# Patient Record
Sex: Female | Born: 1955 | Race: White | Hispanic: No | Marital: Married | Smoking: Former smoker
Health system: Southern US, Community
[De-identification: ages and names within clinical notes are randomized; demographics above are authoritative.]

## PROBLEM LIST (undated history)

## (undated) ENCOUNTER — Ambulatory Visit: Admission: EM | Discharge status: Against Medical Advice | Payer: Medicaid Other

## (undated) DIAGNOSIS — B1920 Unspecified viral hepatitis C without hepatic coma: Secondary | ICD-10-CM

## (undated) DIAGNOSIS — F329 Major depressive disorder, single episode, unspecified: Secondary | ICD-10-CM

## (undated) DIAGNOSIS — F32A Depression, unspecified: Secondary | ICD-10-CM

## (undated) DIAGNOSIS — F1920 Other psychoactive substance dependence, uncomplicated: Secondary | ICD-10-CM

## (undated) DIAGNOSIS — R569 Unspecified convulsions: Secondary | ICD-10-CM

## (undated) DIAGNOSIS — H269 Unspecified cataract: Secondary | ICD-10-CM

## (undated) DIAGNOSIS — R519 Headache, unspecified: Secondary | ICD-10-CM

## (undated) DIAGNOSIS — Z860100 Personal history of colon polyps, unspecified: Secondary | ICD-10-CM

## (undated) DIAGNOSIS — J449 Chronic obstructive pulmonary disease, unspecified: Secondary | ICD-10-CM

## (undated) DIAGNOSIS — B019 Varicella without complication: Secondary | ICD-10-CM

## (undated) DIAGNOSIS — R51 Headache: Secondary | ICD-10-CM

## (undated) DIAGNOSIS — Z8619 Personal history of other infectious and parasitic diseases: Secondary | ICD-10-CM

## (undated) DIAGNOSIS — I639 Cerebral infarction, unspecified: Secondary | ICD-10-CM

## (undated) HISTORY — PX: EYE SURGERY: SHX253

## (undated) HISTORY — PX: CHOLECYSTECTOMY: SHX55

## (undated) HISTORY — DX: Cerebral infarction, unspecified: I63.9

## (undated) HISTORY — PX: APPENDECTOMY: SHX54

---

## 2004-04-12 ENCOUNTER — Inpatient Hospital Stay: Payer: Self-pay | Admitting: Internal Medicine

## 2005-07-27 ENCOUNTER — Emergency Department: Payer: Self-pay | Admitting: Emergency Medicine

## 2006-10-16 ENCOUNTER — Emergency Department: Payer: Self-pay | Admitting: Emergency Medicine

## 2008-11-02 ENCOUNTER — Inpatient Hospital Stay: Payer: Self-pay | Admitting: *Deleted

## 2008-11-04 ENCOUNTER — Ambulatory Visit: Payer: Self-pay | Admitting: Oncology

## 2008-12-05 ENCOUNTER — Ambulatory Visit: Payer: Self-pay | Admitting: Oncology

## 2008-12-13 ENCOUNTER — Emergency Department: Payer: Self-pay | Admitting: Emergency Medicine

## 2009-07-17 ENCOUNTER — Emergency Department: Payer: Self-pay | Admitting: Internal Medicine

## 2010-03-01 ENCOUNTER — Ambulatory Visit: Payer: Self-pay | Admitting: Internal Medicine

## 2010-08-30 ENCOUNTER — Emergency Department: Payer: Self-pay | Admitting: Internal Medicine

## 2010-12-18 ENCOUNTER — Ambulatory Visit: Payer: Self-pay | Admitting: Family Medicine

## 2011-01-10 DIAGNOSIS — G43909 Migraine, unspecified, not intractable, without status migrainosus: Secondary | ICD-10-CM | POA: Insufficient documentation

## 2011-01-10 DIAGNOSIS — G63 Polyneuropathy in diseases classified elsewhere: Secondary | ICD-10-CM | POA: Insufficient documentation

## 2011-03-24 DIAGNOSIS — R609 Edema, unspecified: Secondary | ICD-10-CM | POA: Insufficient documentation

## 2011-03-24 DIAGNOSIS — F17200 Nicotine dependence, unspecified, uncomplicated: Secondary | ICD-10-CM | POA: Insufficient documentation

## 2012-03-10 ENCOUNTER — Ambulatory Visit: Payer: Self-pay | Admitting: Ophthalmology

## 2012-03-28 ENCOUNTER — Ambulatory Visit: Payer: Self-pay | Admitting: Physician Assistant

## 2012-04-14 ENCOUNTER — Ambulatory Visit: Payer: Self-pay | Admitting: Ophthalmology

## 2012-05-01 ENCOUNTER — Inpatient Hospital Stay: Payer: Self-pay | Admitting: Internal Medicine

## 2012-05-01 LAB — COMPREHENSIVE METABOLIC PANEL
Albumin: 3.9 g/dL (ref 3.4–5.0)
Alkaline Phosphatase: 173 U/L — ABNORMAL HIGH (ref 50–136)
Anion Gap: 10 (ref 7–16)
BUN: 12 mg/dL (ref 7–18)
Calcium, Total: 9.1 mg/dL (ref 8.5–10.1)
Chloride: 100 mmol/L (ref 98–107)
EGFR (African American): >60
Glucose: 135 mg/dL — ABNORMAL HIGH (ref 65–99)
Potassium: 3.2 mmol/L — ABNORMAL LOW (ref 3.5–5.1)
SGOT(AST): 13 U/L — ABNORMAL LOW (ref 15–37)
SGPT (ALT): 21 U/L (ref 12–78)
Total Protein: 7.1 g/dL (ref 6.4–8.2)

## 2012-05-01 LAB — CBC
HCT: 42.7 % (ref 35.0–47.0)
HGB: 14.1 g/dL (ref 12.0–16.0)
MCH: 32.3 pg (ref 26.0–34.0)
MCHC: 32.9 g/dL (ref 32.0–36.0)
MCV: 98 fL (ref 80–100)
RBC: 4.35 10*6/uL (ref 3.80–5.20)
RDW: 14.6 % — ABNORMAL HIGH (ref 11.5–14.5)

## 2012-05-01 LAB — TROPONIN I: Troponin-I: <0.02 ng/mL

## 2012-05-01 LAB — URINALYSIS, COMPLETE
Bilirubin,UR: NEGATIVE
Ph: 6 (ref 4.5–8.0)
Protein: 30
RBC,UR: 5 /HPF (ref 0–5)
Squamous Epithelial: <1

## 2012-05-01 LAB — CSF CELL CT + PROT + GLU PANEL
CSF Tube #: 1
Eosinophil: 0 %
Glucose, CSF: 97 mg/dL — ABNORMAL HIGH (ref 40–75)
Protein, CSF: 171 mg/dL — ABNORMAL HIGH (ref 15–45)
RBC (CSF): 12982 /mm3
WBC (CSF): 73 /mm3

## 2012-05-01 LAB — DRUG SCREEN, URINE
Amphetamines, Ur Screen: NEGATIVE (ref ?–1000)
Benzodiazepine, Ur Scrn: NEGATIVE (ref ?–200)
MDMA (Ecstasy)Ur Screen: NEGATIVE (ref ?–500)
Methadone, Ur Screen: POSITIVE (ref ?–300)
Opiate, Ur Screen: NEGATIVE (ref ?–300)
Phencyclidine (PCP) Ur S: NEGATIVE (ref ?–25)
Tricyclic, Ur Screen: NEGATIVE (ref ?–1000)

## 2012-05-01 LAB — ETHANOL
Ethanol %: <0.003 % (ref 0.000–0.080)
Ethanol: <3 mg/dL

## 2012-05-01 LAB — ACETAMINOPHEN LEVEL: Acetaminophen: <2 ug/mL

## 2012-05-02 LAB — CBC WITH DIFFERENTIAL/PLATELET
Bands: 3 %
HGB: 16.5 g/dL — ABNORMAL HIGH (ref 12.0–16.0)
MCV: 97 fL (ref 80–100)
Monocytes: 7 %
RBC: 5.16 10*6/uL (ref 3.80–5.20)
Segmented Neutrophils: 78 %
Variant Lymphocyte - H1-Rlymph: 2 %
WBC: 27.2 10*3/uL — ABNORMAL HIGH (ref 3.6–11.0)

## 2012-05-02 LAB — BASIC METABOLIC PANEL
Anion Gap: 12 (ref 7–16)
BUN: 11 mg/dL (ref 7–18)
Chloride: 93 mmol/L — ABNORMAL LOW (ref 98–107)
Creatinine: 0.78 mg/dL (ref 0.60–1.30)
EGFR (African American): >60
EGFR (Non-African Amer.): >60
Glucose: 133 mg/dL — ABNORMAL HIGH (ref 65–99)
Osmolality: 264 (ref 275–301)

## 2012-05-02 LAB — POTASSIUM: Potassium: 3.2 mmol/L — ABNORMAL LOW (ref 3.5–5.1)

## 2012-05-03 LAB — CBC WITH DIFFERENTIAL/PLATELET
Basophil %: 0.2 %
HCT: 41 % (ref 35.0–47.0)
HGB: 14.2 g/dL (ref 12.0–16.0)
Lymphocyte #: 2.8 10*3/uL (ref 1.0–3.6)
Lymphocyte %: 13.1 %
MCH: 34 pg (ref 26.0–34.0)
MCV: 98 fL (ref 80–100)
Monocyte %: 7.3 %
Neutrophil #: 16.7 10*3/uL — ABNORMAL HIGH (ref 1.4–6.5)
Platelet: 121 10*3/uL — ABNORMAL LOW (ref 150–440)
RDW: 14.6 % — ABNORMAL HIGH (ref 11.5–14.5)

## 2012-05-03 LAB — BASIC METABOLIC PANEL
Calcium, Total: 8 mg/dL — ABNORMAL LOW (ref 8.5–10.1)
Chloride: 97 mmol/L — ABNORMAL LOW (ref 98–107)
Creatinine: 1.1 mg/dL (ref 0.60–1.30)
EGFR (African American): >60
EGFR (Non-African Amer.): 57 — ABNORMAL LOW
Glucose: 164 mg/dL — ABNORMAL HIGH (ref 65–99)
Potassium: 3.9 mmol/L (ref 3.5–5.1)
Sodium: 129 mmol/L — ABNORMAL LOW (ref 136–145)

## 2012-05-04 LAB — BASIC METABOLIC PANEL
BUN: 12 mg/dL (ref 7–18)
Calcium, Total: 8.3 mg/dL — ABNORMAL LOW (ref 8.5–10.1)
Chloride: 111 mmol/L — ABNORMAL HIGH (ref 98–107)
Co2: 23 mmol/L (ref 21–32)
Creatinine: 0.88 mg/dL (ref 0.60–1.30)
EGFR (African American): >60
Glucose: 157 mg/dL — ABNORMAL HIGH (ref 65–99)

## 2012-05-04 LAB — CBC WITH DIFFERENTIAL/PLATELET
Basophil #: 0 10*3/uL (ref 0.0–0.1)
Basophil %: 0.2 %
Eosinophil #: 0 10*3/uL (ref 0.0–0.7)
Lymphocyte %: 3.8 %
MCV: 99 fL (ref 80–100)
Monocyte #: 0.7 x10 3/mm (ref 0.2–0.9)
Neutrophil #: 12.5 10*3/uL — ABNORMAL HIGH (ref 1.4–6.5)
Neutrophil %: 91.2 %
RBC: 3.71 10*6/uL — ABNORMAL LOW (ref 3.80–5.20)
RDW: 14.6 % — ABNORMAL HIGH (ref 11.5–14.5)
WBC: 13.8 10*3/uL — ABNORMAL HIGH (ref 3.6–11.0)

## 2012-05-04 LAB — CSF CULTURE

## 2012-05-04 LAB — AMMONIA: Ammonia, Plasma: <25 mcmol/L (ref 11–32)

## 2012-05-05 DIAGNOSIS — J984 Other disorders of lung: Secondary | ICD-10-CM

## 2012-05-05 LAB — CBC WITH DIFFERENTIAL/PLATELET
Basophil %: 0.4 %
Eosinophil #: 0 10*3/uL (ref 0.0–0.7)
Eosinophil %: 0 %
Lymphocyte #: 1.1 10*3/uL (ref 1.0–3.6)
Lymphocyte %: 10.4 %
MCH: 33.8 pg (ref 26.0–34.0)
MCHC: 34.5 g/dL (ref 32.0–36.0)
MCV: 98 fL (ref 80–100)
Monocyte #: 0.4 x10 3/mm (ref 0.2–0.9)
Monocyte %: 3.5 %
Neutrophil #: 9.4 10*3/uL — ABNORMAL HIGH (ref 1.4–6.5)
Platelet: 144 10*3/uL — ABNORMAL LOW (ref 150–440)
RBC: 3.64 10*6/uL — ABNORMAL LOW (ref 3.80–5.20)

## 2012-05-05 LAB — BASIC METABOLIC PANEL
Anion Gap: 9 (ref 7–16)
Calcium, Total: 8.3 mg/dL — ABNORMAL LOW (ref 8.5–10.1)
Co2: 22 mmol/L (ref 21–32)
EGFR (African American): >60
EGFR (Non-African Amer.): >60
Glucose: 158 mg/dL — ABNORMAL HIGH (ref 65–99)
Osmolality: 292 (ref 275–301)
Potassium: 3.3 mmol/L — ABNORMAL LOW (ref 3.5–5.1)
Sodium: 145 mmol/L (ref 136–145)

## 2012-05-05 LAB — POTASSIUM: Potassium: 4.5 mmol/L (ref 3.5–5.1)

## 2012-05-05 LAB — PHOSPHORUS: Phosphorus: 3 mg/dL (ref 2.5–4.9)

## 2012-05-06 LAB — CBC WITH DIFFERENTIAL/PLATELET
Basophil #: 0 10*3/uL (ref 0.0–0.1)
Eosinophil #: 0 10*3/uL (ref 0.0–0.7)
Eosinophil %: 0 %
HGB: 11.8 g/dL — ABNORMAL LOW (ref 12.0–16.0)
Lymphocyte #: 1.1 10*3/uL (ref 1.0–3.6)
Lymphocyte %: 10.1 %
MCH: 33.1 pg (ref 26.0–34.0)
MCHC: 33.5 g/dL (ref 32.0–36.0)
Monocyte %: 4.2 %
Neutrophil %: 85.5 %
Platelet: 160 10*3/uL (ref 150–440)
RBC: 3.55 10*6/uL — ABNORMAL LOW (ref 3.80–5.20)
RDW: 15.2 % — ABNORMAL HIGH (ref 11.5–14.5)

## 2012-05-06 LAB — BASIC METABOLIC PANEL
Anion Gap: 8 (ref 7–16)
BUN: 19 mg/dL — ABNORMAL HIGH (ref 7–18)
Calcium, Total: 8.1 mg/dL — ABNORMAL LOW (ref 8.5–10.1)
Chloride: 114 mmol/L — ABNORMAL HIGH (ref 98–107)
Co2: 22 mmol/L (ref 21–32)
Creatinine: 0.7 mg/dL (ref 0.60–1.30)
Glucose: 165 mg/dL — ABNORMAL HIGH (ref 65–99)
Osmolality: 293 (ref 275–301)
Potassium: 4.2 mmol/L (ref 3.5–5.1)
Sodium: 144 mmol/L (ref 136–145)

## 2012-05-06 LAB — CULTURE, BLOOD (SINGLE)

## 2012-05-07 LAB — EXPECTORATED SPUTUM ASSESSMENT W GRAM STAIN, RFLX TO RESP C

## 2012-05-09 LAB — CBC WITH DIFFERENTIAL/PLATELET
Basophil #: 0 10*3/uL (ref 0.0–0.1)
Eosinophil #: 0 10*3/uL (ref 0.0–0.7)
HCT: 34.3 % — ABNORMAL LOW (ref 35.0–47.0)
HGB: 11.4 g/dL — ABNORMAL LOW (ref 12.0–16.0)
Lymphocyte #: 1.5 10*3/uL (ref 1.0–3.6)
Lymphocyte %: 12.3 %
MCH: 32.8 pg (ref 26.0–34.0)
MCHC: 33.4 g/dL (ref 32.0–36.0)
Monocyte #: 0.8 x10 3/mm (ref 0.2–0.9)
Neutrophil #: 10.1 10*3/uL — ABNORMAL HIGH (ref 1.4–6.5)
RDW: 14.6 % — ABNORMAL HIGH (ref 11.5–14.5)

## 2012-05-09 LAB — BASIC METABOLIC PANEL
Anion Gap: 6 — ABNORMAL LOW (ref 7–16)
Calcium, Total: 9.1 mg/dL (ref 8.5–10.1)
Co2: 32 mmol/L (ref 21–32)
Creatinine: 0.64 mg/dL (ref 0.60–1.30)
EGFR (African American): >60
Osmolality: 274 (ref 275–301)

## 2013-08-04 DIAGNOSIS — N183 Chronic kidney disease, stage 3 unspecified: Secondary | ICD-10-CM | POA: Insufficient documentation

## 2013-10-14 DIAGNOSIS — G40309 Generalized idiopathic epilepsy and epileptic syndromes, not intractable, without status epilepticus: Secondary | ICD-10-CM | POA: Insufficient documentation

## 2013-10-26 IMAGING — CR DG CHEST 1V PORT
1 series · 2 of 2 positions shown · non-contrast
Comparison: none

REASON FOR EXAM: central line placement
COMMENTS:

[Series 1: ap · 0.17mm/px · 2 of 2 slices shown]
[im 1/2]
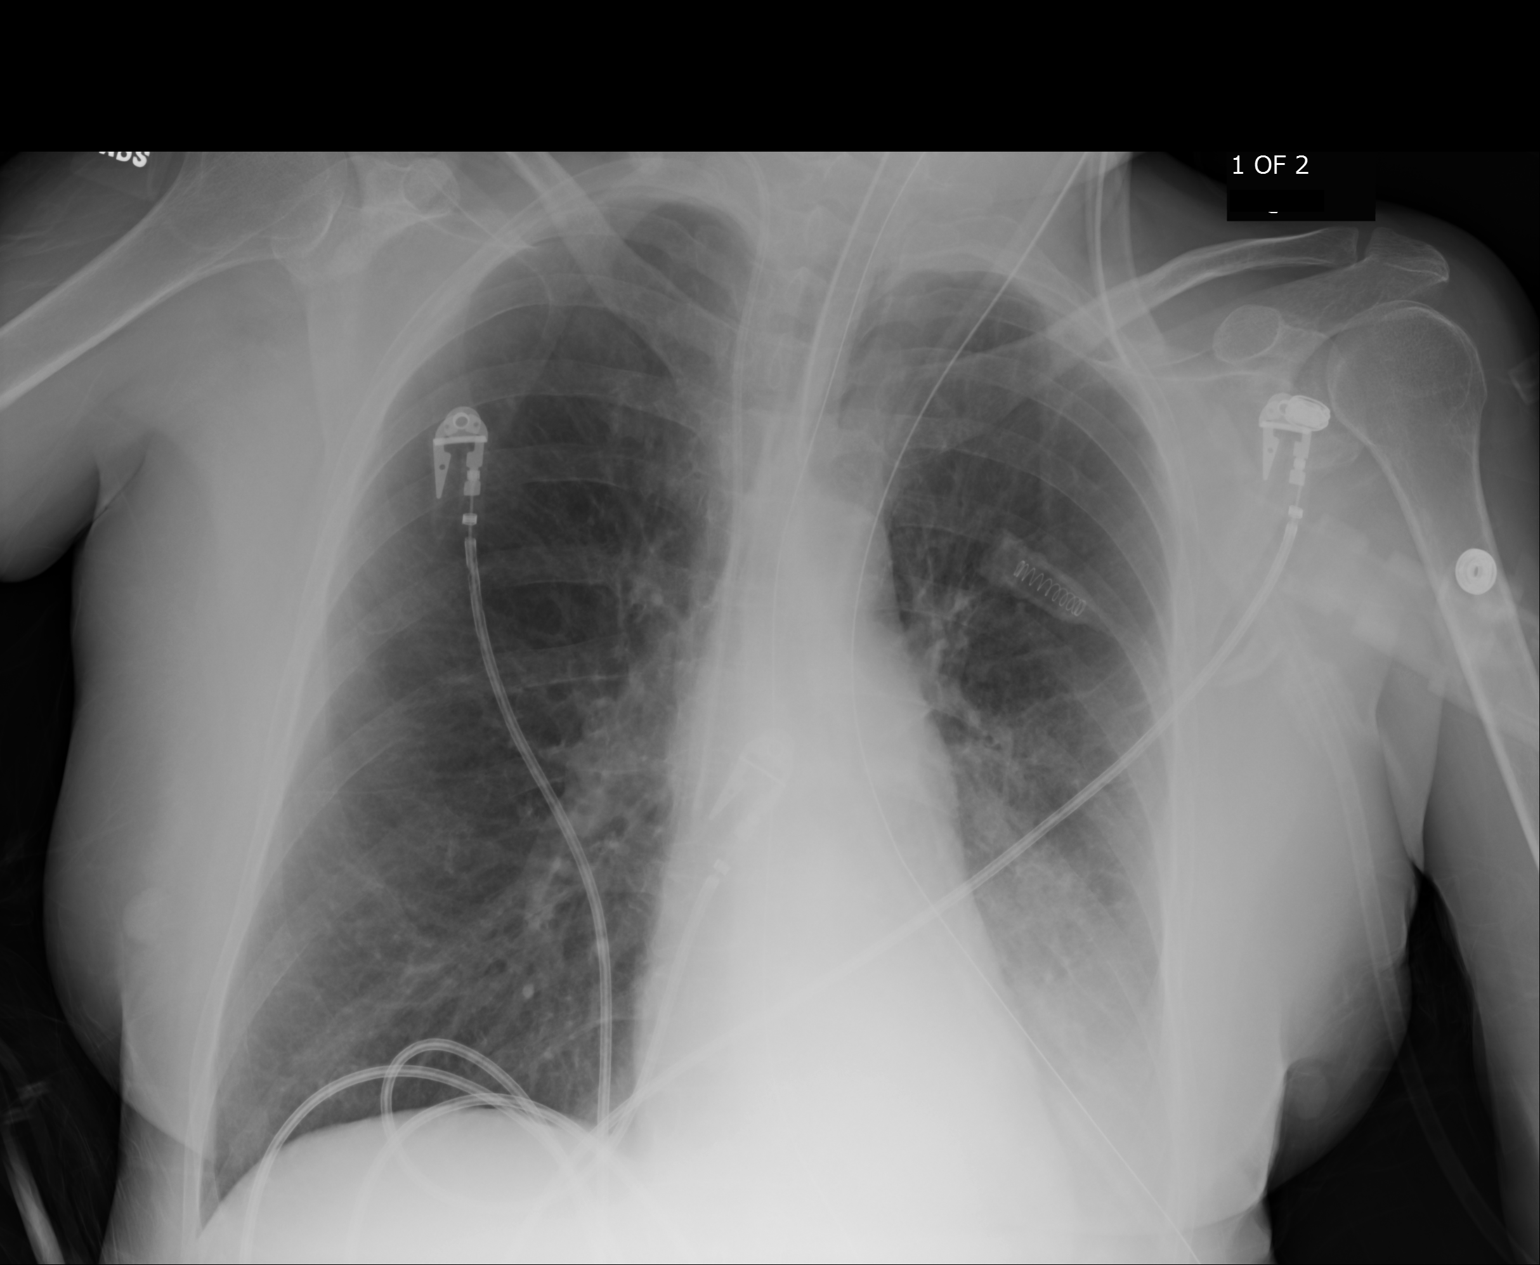
[im 2/2]
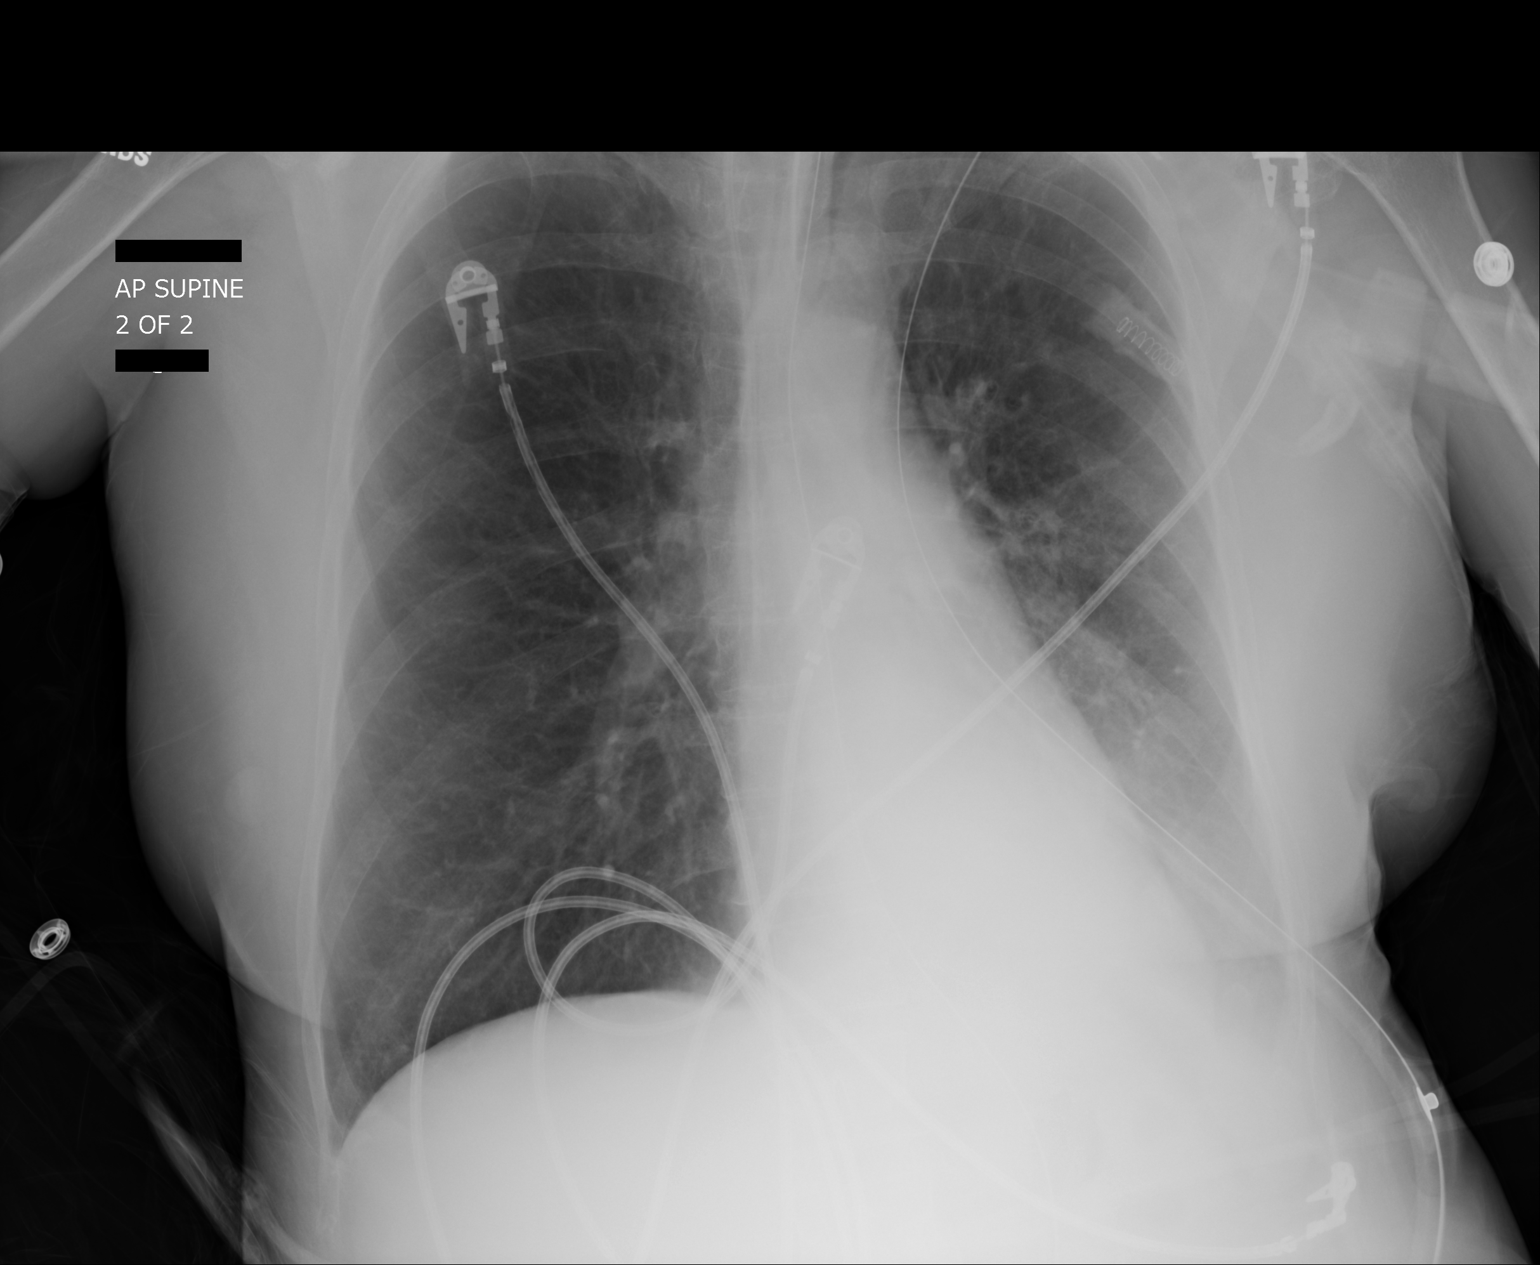

[2 of 2 positions shown; findings below may reference images not displayed]

PROCEDURE:     DXR - DXR PORTABLE CHEST SINGLE VIEW  - May 03, 2012 [DATE]

RESULT:     Comparison is made with previous study of the same date at [DATE]
a.m. The patient is rotated toward the left. A right internal central venous
catheter has been placed since the previous study. The tip appears to be in
the superior vena cava near the junction with the right atrium. Nasogastric
and endotracheal tube remain in place. There is some hazy increased density
at the left lung base.
IMPRESSION: Interval placement of a right internal jugular central
venous catheter as described. Some persistent minimal atelectasis in the
left lung base is suggested. A small left effusion is not excluded.

[REDACTED]

## 2013-10-27 IMAGING — CR DG CHEST 1V PORT
1 series · 1 of 1 positions shown · non-contrast
Comparison: none

REASON FOR EXAM: resp failure
COMMENTS:

[ap]
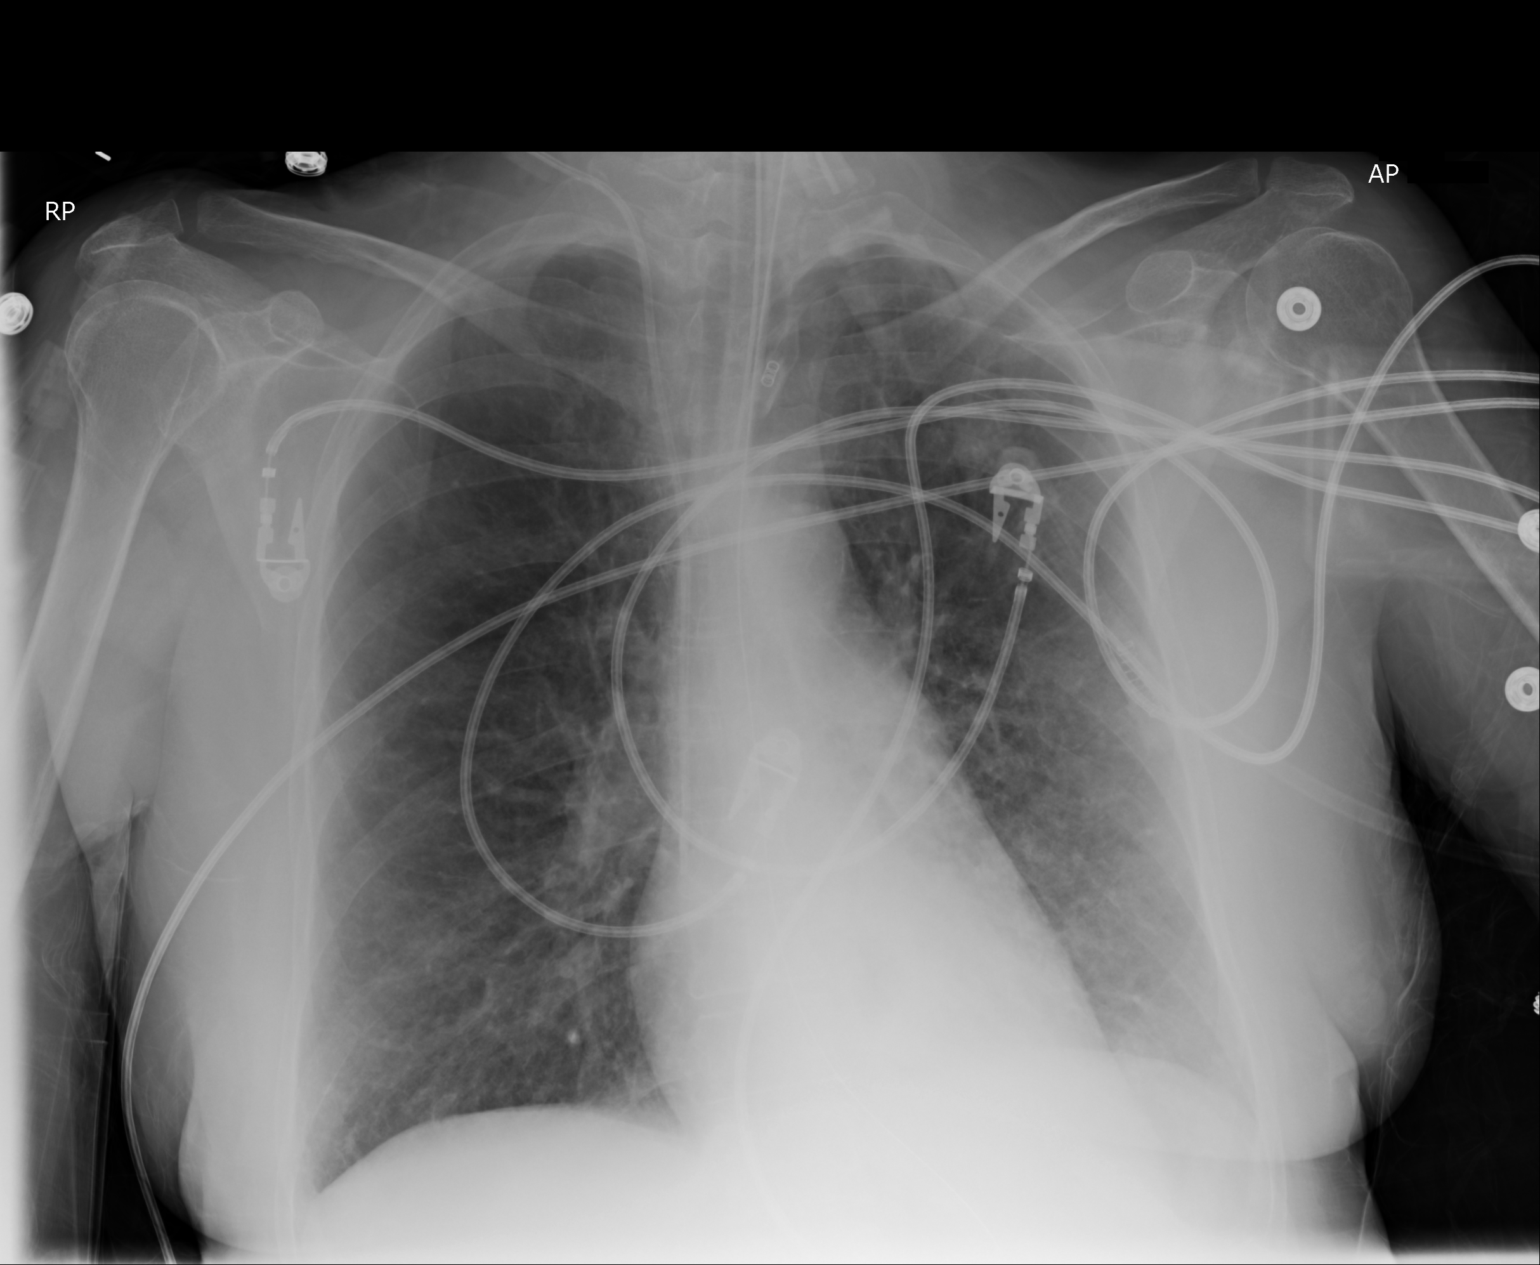

[1 of 1 positions shown; findings below may reference images not displayed]

PROCEDURE:     DXR - DXR PORTABLE CHEST SINGLE VIEW  - May 04, 2012 [DATE]

RESULT:

Comparison is made to a prior study dated 05/03/2012.

Endotracheal tube is appreciated with the tip at the level of the clavicles.
A right sided central venous catheter is appreciated with the tip at the
level of the superior vena cava, right atrial junction. NG tube is seen with
the tip not on the view of the study.

No focal regions of consolidation or focal infiltrates appreciated. There is
mild prominence of the interstitial markings. The cardiac silhouette is
within normal limits. The visualized bony skeleton is unremarkable.
IMPRESSION: 1.     Mild interstitial prominence which may represent a component of
pulmonary vascular congestion. No focal regions of consolidation or focal
infiltrates.
2.     Support lines and tubes as described above.

## 2013-10-29 IMAGING — CR DG CHEST 1V PORT
1 series · 1 of 1 positions shown · non-contrast
Comparison: none

REASON FOR EXAM: follow up pneumonia
COMMENTS:

[ap]
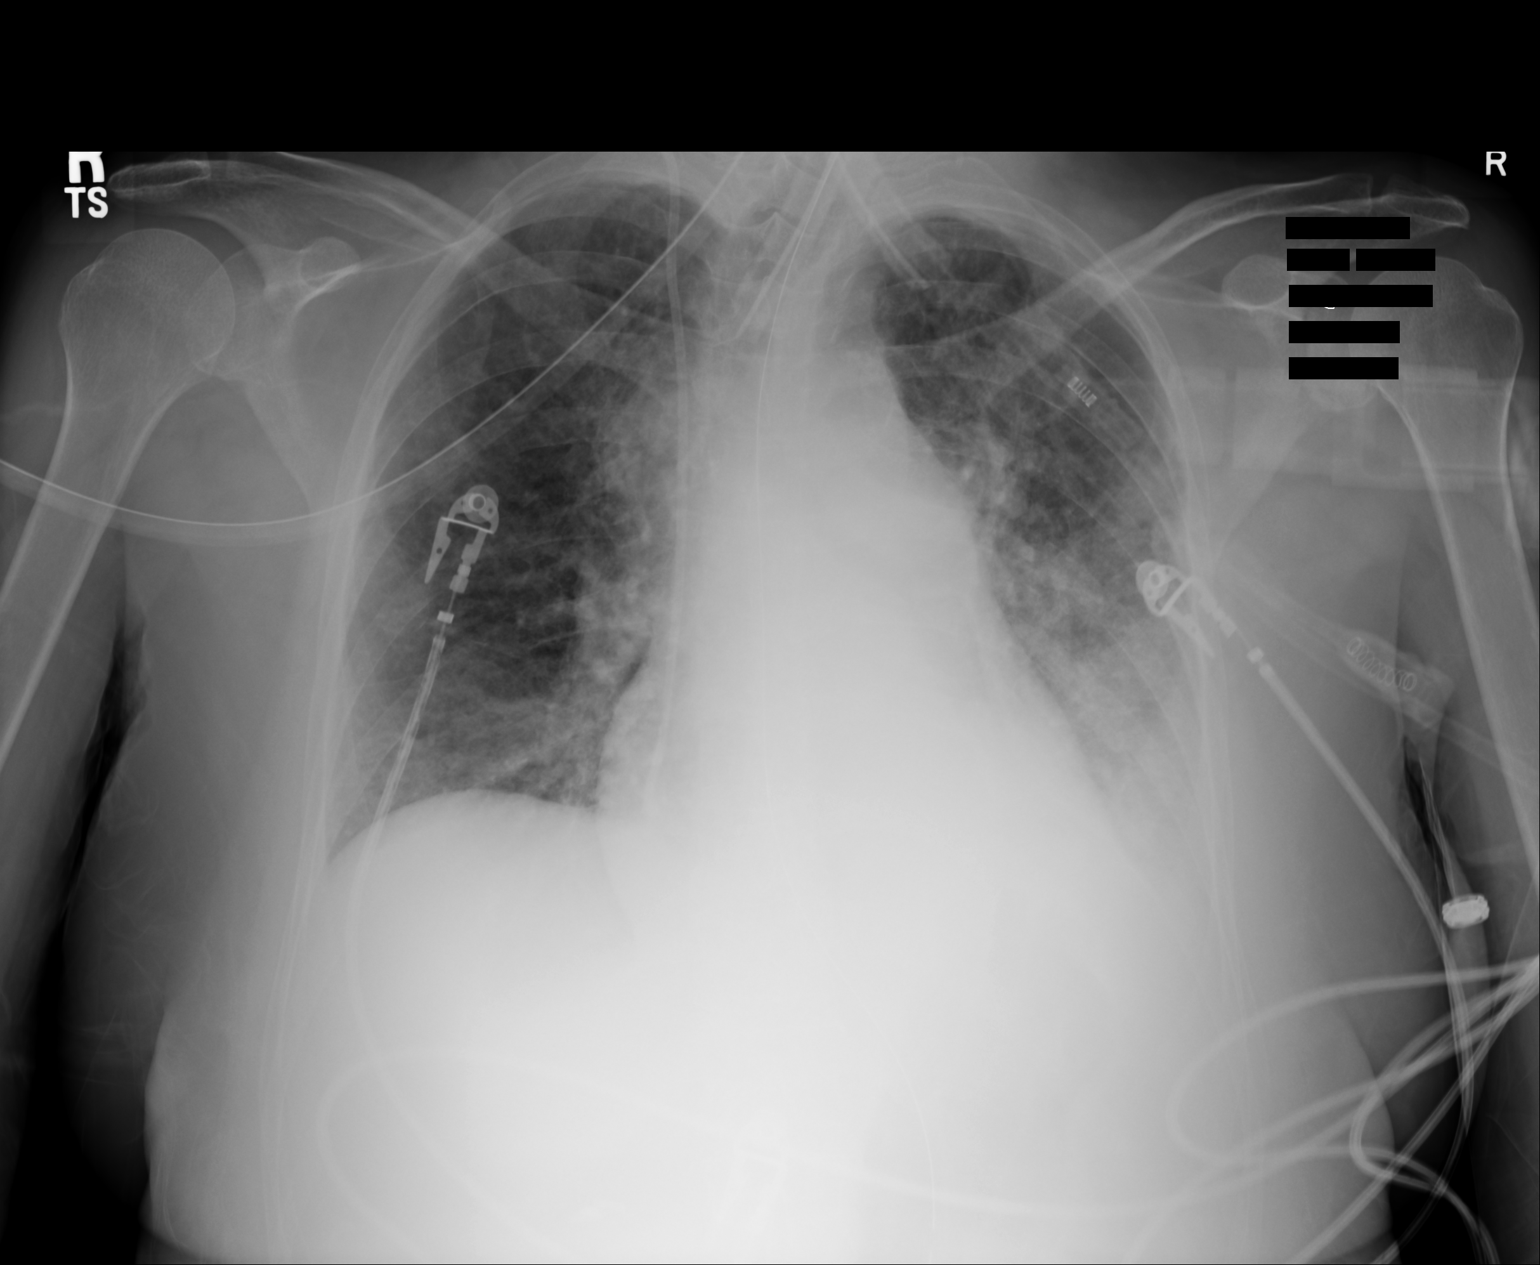

[1 of 1 positions shown; findings below may reference images not displayed]

PROCEDURE:     DXR - DXR PORTABLE CHEST SINGLE VIEW  - May 06, 2012  [DATE]

RESULT:     Comparison is made to the study of 29 up to 6937.

A right internal jugular central venous catheter projects in the inferior
right atrium near the level of the diaphragm. There is diffuse interstitial
edema. Endotracheal tube projects just above the level of the
sternoclavicular joints the patient is rotated toward the left. There is
diffuse interstitial edema. Some left lung base pneumonia or atelectasis
appears present. Small left effusion appears present.
IMPRESSION: Increasing opacification of the left lung base with
increasing interstitial edema. The cardiac silhouette is enlarged. The
findings may be affected by the lower volume of inspiration compared to the
previous study.

[REDACTED]

## 2013-10-29 IMAGING — CR DG ABDOMEN 1V
1 series · 2 of 2 positions shown · non-contrast
Comparison: none

REASON FOR EXAM: vomiting
COMMENTS:   Bedside (portable):Y

PROCEDURE:     DXR - DXR KIDNEY URETER BLADDER  - May 06, 2012  [DATE]
RESULT:     Comparison: None.

[Series 1: supine kub · 0.17mm/px · 2 of 2 slices shown]
[im 1/2]
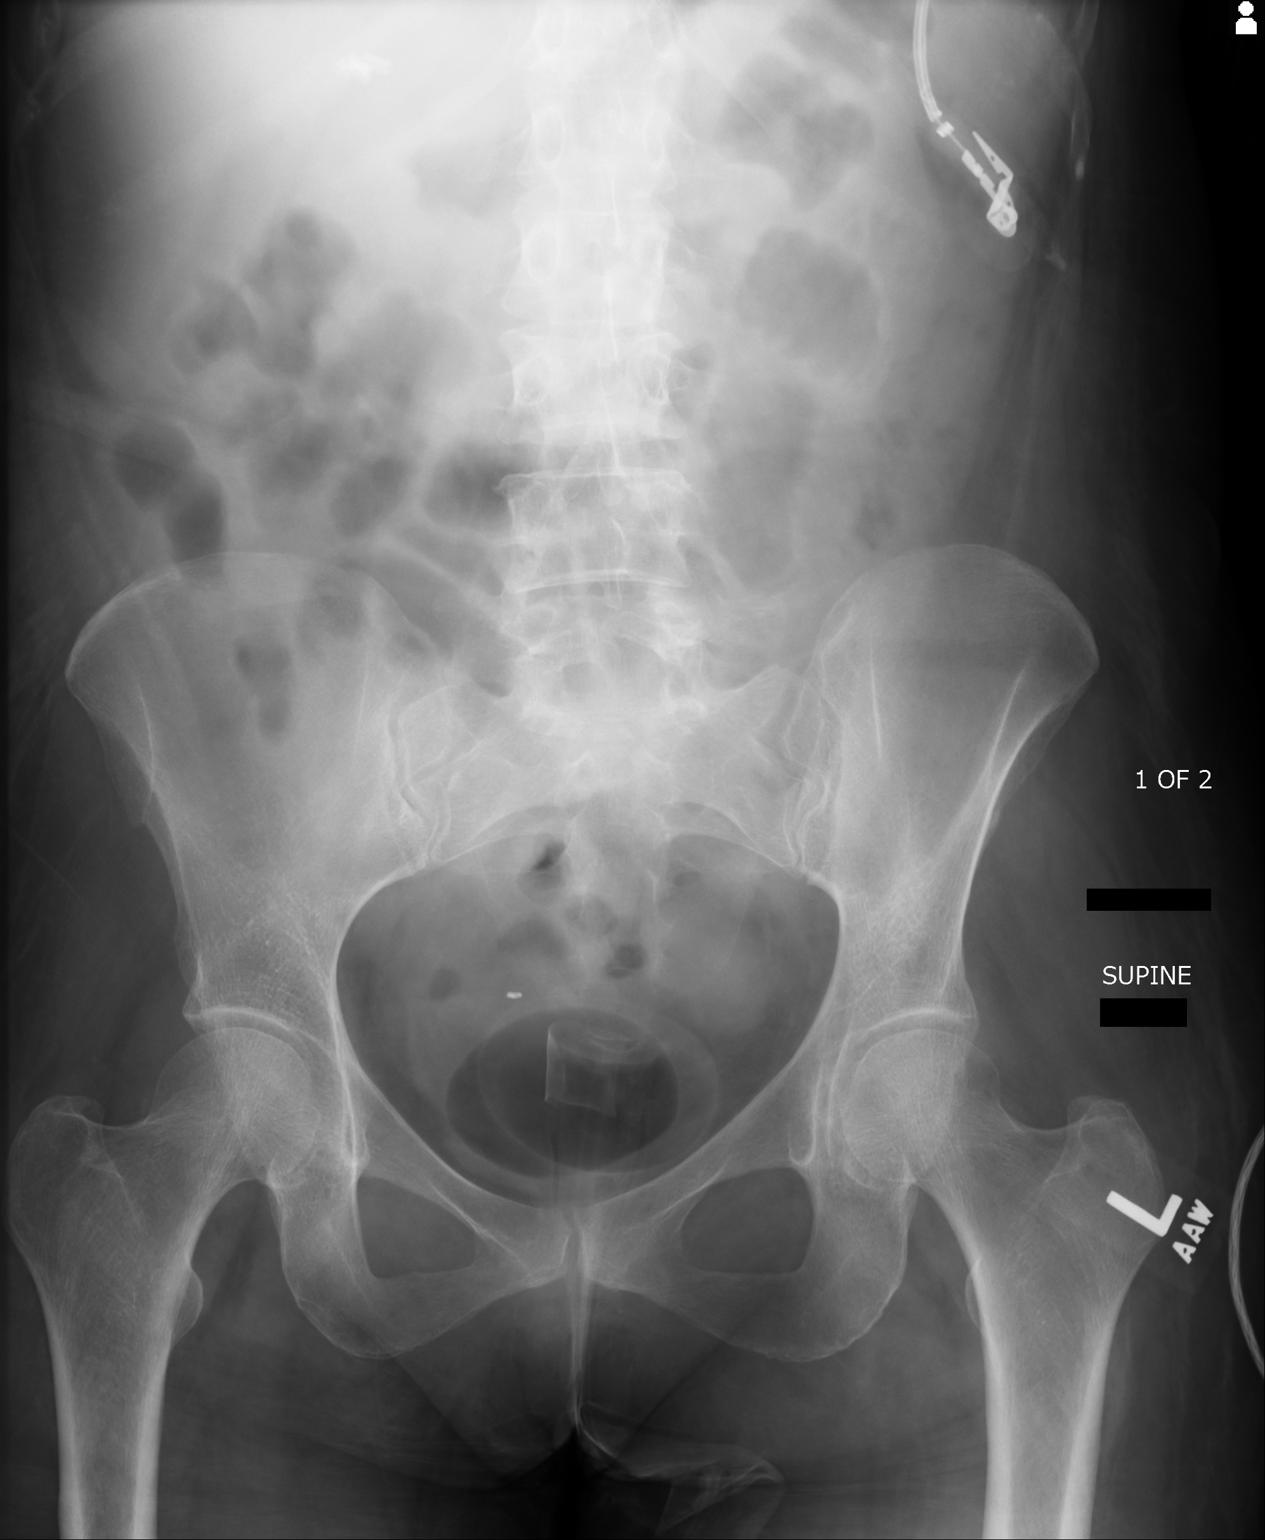
[im 2/2]
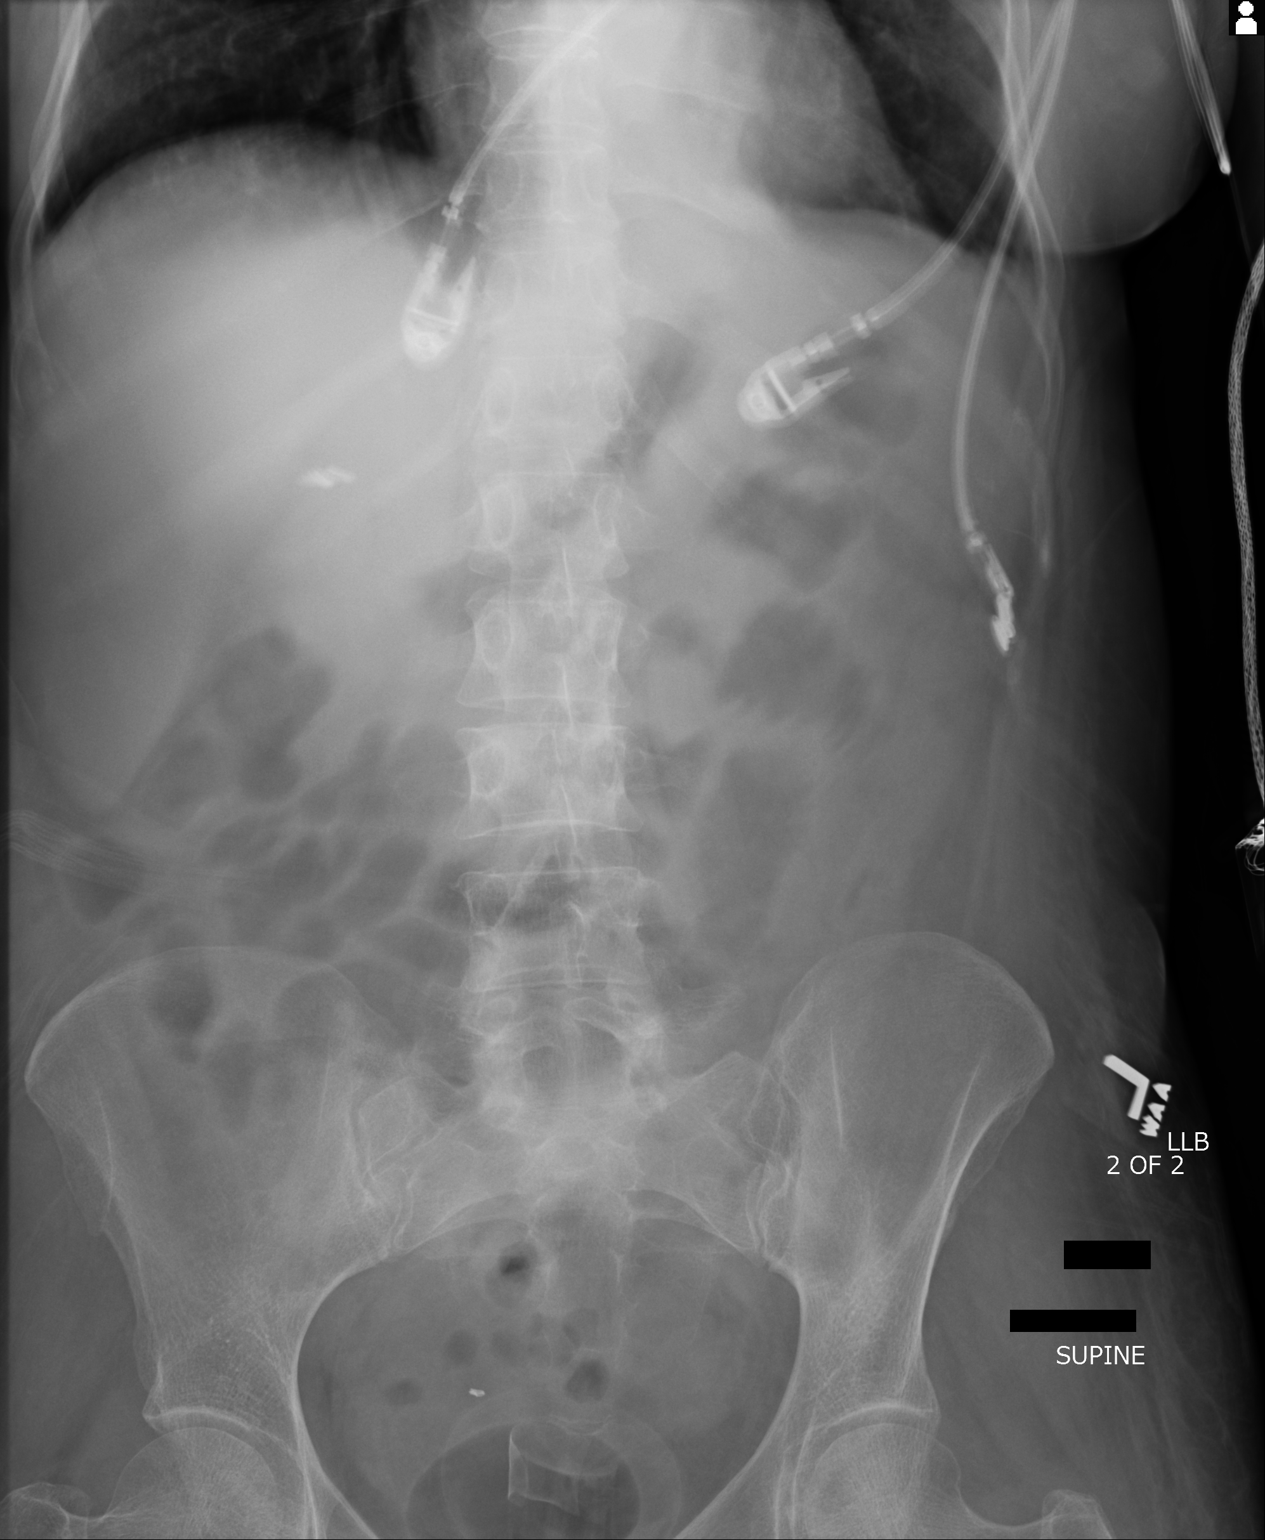

[2 of 2 positions shown; findings below may reference images not displayed]

FINDINGS: Evaluation is limited by patient motion artifact. Air is seen within
nondilated small and large bowel. Surgical clips are seen from prior
cholecystectomy. There is a round lucency with an area of rectangular
density overlying the bladder and rectum, which is of uncertain etiology.
IMPRESSION: 1. Nonobstructed bowel gas pattern.
2. There is a moderate-sized round lucency with a rectangular density
overlies the bladder and rectum. This is of uncertain etiology, possibly
related to a device within the bladder or rectum. Clinical correlation is
recommended.

[REDACTED]

## 2013-11-08 ENCOUNTER — Ambulatory Visit: Payer: Self-pay | Admitting: Gastroenterology

## 2014-07-26 DIAGNOSIS — G43709 Chronic migraine without aura, not intractable, without status migrainosus: Secondary | ICD-10-CM | POA: Insufficient documentation
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
## 2014-10-24 NOTE — Discharge Summary (Signed)
PATIENT NAME:  Natalie [name], Natalie [name] MR#:  [id number] DATE OF BIRTH:  07-04-1956  DATE OF ADMISSION:  05/01/2012 DATE OF DISCHARGE:  05/09/2012  DISCHARGE DIAGNOSES:  1. Acute encephalopathy secondary to viral encephalitis, acute on chronic respiratory failure secondary to pneumonia atelectasis and collapsed lung, status post extubation. 2. Seizure disorder. 3. Chronic pain, on methadone now. 4. Sepsis present on admission due to pneumonia and encephalitis, treated.  5. Chronic obstructive pulmonary disease. 6. Hypokalemia.  DISCHARGE MEDICATIONS:  1. Combivent aerosol 2 puffs every 4 to 6 hours. 2. Daliresp 500 mcg p.o. daily.  3. Methadone 155 mg orally once daily, says she splits the dose, says she takes 75 mg p.o. twice a day.  4. Mirtazapine 15 mg at bedtime.  5. Keppra 1 gram p.o. twice a day. The patient also had Keppra here but she says she takes 500 mg p.o. twice a day but it was increased because of possible seizure during the hospital stay and she needs to see Dr. Loletta Specter.  6. KCL 20 milliequivalents p.o. twice a day for two days only.   DISCHARGE OXYGEN: 2 liters by nasal cannula.   DIET: Regular diet.   CONSULTANTS:  [place], MD - Pulmonology. 2. Lanae Boast, MD - Infectious Disease. 3. Physical Therapy.   HOSPITAL COURSE: Acute respiratory failure. This is a 59 year old female with history of seizure disorder, hepatitic C status post treatment, history of chronic obstructive pulmonary disease on oxygen, and chronic pain; she is on methadone at this time. She came in because of altered mental status with high grade fever. She had a LP done in the Emergency Room, which was a traumatic tap. Sputum cultures have been negative. She was initially started on cefepime and vancomycin. The patient was also placed on droplet precautions. The patient' CT of the head did not show any acute changes. The patient's ABG also was within normal limits on admission. The patient was admitted.  The patient desaturated. The patient's chest x-ray at that time showed lung atelectasis, on the left side, with near opacification. The patient was immediately intubated and transferred to the Intensive Care Unit for acute respiratory failure with left lung collapse with possible mucous plug. The patient continued on full vent support with sedation. Dr. Devona Konig saw the patient. The patient was on high PEEP and actually had mucous plug resolved and the patient was finally able to come off the ventilator and was extubated on 05/06/2012. The patient was kept in the Intensive Care Unit because she was confused in between and required Geodon, but after extubation the patient remained stable. She had some nausea which resolved with Zofran. KUB was not significant, except for some constipation. The patient did receive stool softeners, rectal tube, and stools became normal and rectal tube had no output and it was out. Her  respiratory failure improved and she was on 4 liters, transferred to the floor. She is on 2 liters today. She has 2 liters of oxygen at home. She is advised to continue that. The patient did finish the cefepime for seven days and CSF cultures have been negative. She was on acyclovir initially for possible HSV encephalitis but HSV cultures were negative. We discharged her home with tapering dose of prednisone and she is advised to continue her Daliresp along with Combivent and continue oxygen and can see Dr. Devona Konig as an outpatient. She had hypokalemia, potassium is 2.9 this morning. She received potassium 40 IV and I wrote for two days of  KCL. The patient's mental status improved and she is tolerating the diet and we have added Ensure also that she can be continued on. She was seen by a physical therapist and they recommended home physical therapy. The patient does have a walker at home, according to her boyfriend. Physical therapy recommended home physical therapy. The patient did well going 20  feet with a rolling walker. They recommended home physical therapy and rehab physical therapy.  TIME SPENT ON DISCHARGE PREPARATION: More than 30 minutes.  ____________________________ Epifanio Lesches, MD sk:slb D: 05/09/2012 23:07:26 ET     T: 05/10/2012 11:19:01 ET       JOB#: [id number] cc: Allyne Gee, MD Epifanio Lesches MD ELECTRONICALLY SIGNED 05/18/2012 19:51
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
## 2014-10-24 NOTE — Consult Note (Signed)
Impression: 59yo WF w/ h/o hep C infection, complicated with hepatic encephalopathy with treatment, seizure disorder admitted with MS changes and possible meningoencephalitis.  Her lumbar puncture was a bloody tap, but I would expect that 13K RBC in the CSF would have 55 WBCs present given her peripheral WBC and RBC at the time.  As having around 6 WBC is normal, thne 61 WBC would be considered normal in her given the bloody tap.  She had 73, mostly neutrophils, which would be high.  Her cultures, however, are normal.  The description of her confusion is more consistent with encephalitis than meningitis.  Most encephalitis is viral.  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   HSV PCR is pending. Will add Arboviral and West Nile serologies, but this time of year is much less likely to have cases of these diseases as they are mosquito transmitted. Her confusion could have been related to hepatic encephalopathy or atypical seizures.  Hepatic encephalopathy has not been a problem for her except when she was on anti-hep C therapy.  She did have possible seizure activity on admission. Her fiancee states that he witnessed her aspirating while she was confused.  Her CXR and lung CT do not show infiltrate, but did show collapsed lung consistent with mucous plugging.  This has improved. Her CSF cultures are negative.  Will stop droplet isolation. Will adjust her antibiotics to cover aspiration and severe COPD exacerbation.  Will continue the cefepime.  Will send a sputum specimen for culture. Continue vent support as needed. Continue pressors as needed.    Electronic Signatures: Desirai Traxler, Heinz Knuckles (MD) (Signed on 28-Oct-13 13:45)  Authored   Last Updated: 28-Oct-13 13:46 by Mishon Blubaugh, Heinz Knuckles (MD)

## 2014-10-24 NOTE — Consult Note (Signed)
Brief Consult Note: Diagnosis: Reportedly schizophrenia.   Patient was seen by consultant.   Comments: Patient still intubated. We will attempt to interview her tomorow.  Electronic Signatures: Orson Slick (MD)  (Signed 28-Oct-13 15:28)  Authored: Brief Consult Note   Last Updated: 28-Oct-13 15:28 by Orson Slick (MD)

## 2015-04-27 ENCOUNTER — Encounter: Payer: Self-pay | Admitting: *Deleted

## 2015-04-30 ENCOUNTER — Ambulatory Visit: Payer: 59 | Admitting: Anesthesiology

## 2015-04-30 ENCOUNTER — Encounter: Payer: Self-pay | Admitting: *Deleted

## 2015-04-30 ENCOUNTER — Encounter
Admission: RE | Disposition: A | Discharge status: Home / Self Care | Payer: Self-pay | Source: Ambulatory Visit | Attending: Gastroenterology

## 2015-04-30 ENCOUNTER — Ambulatory Visit
Admission: RE | Admit: 2015-04-30 | Discharge: 2015-04-30 | Disposition: A | Discharge status: Home / Self Care | Payer: 59 | Source: Ambulatory Visit | Attending: Gastroenterology | Admitting: Gastroenterology

## 2015-04-30 DIAGNOSIS — Z91041 Radiographic dye allergy status: Secondary | ICD-10-CM | POA: Insufficient documentation

## 2015-04-30 DIAGNOSIS — Z1211 Encounter for screening for malignant neoplasm of colon: Secondary | ICD-10-CM | POA: Insufficient documentation

## 2015-04-30 DIAGNOSIS — Z8371 Family history of colonic polyps: Secondary | ICD-10-CM | POA: Diagnosis not present

## 2015-04-30 DIAGNOSIS — F17210 Nicotine dependence, cigarettes, uncomplicated: Secondary | ICD-10-CM | POA: Insufficient documentation

## 2015-04-30 DIAGNOSIS — Z880 Allergy status to penicillin: Secondary | ICD-10-CM | POA: Diagnosis not present

## 2015-04-30 DIAGNOSIS — B1920 Unspecified viral hepatitis C without hepatic coma: Secondary | ICD-10-CM | POA: Diagnosis not present

## 2015-04-30 DIAGNOSIS — Z881 Allergy status to other antibiotic agents status: Secondary | ICD-10-CM | POA: Diagnosis not present

## 2015-04-30 DIAGNOSIS — Z882 Allergy status to sulfonamides status: Secondary | ICD-10-CM | POA: Diagnosis not present

## 2015-04-30 DIAGNOSIS — Z886 Allergy status to analgesic agent status: Secondary | ICD-10-CM | POA: Insufficient documentation

## 2015-04-30 DIAGNOSIS — K621 Rectal polyp: Secondary | ICD-10-CM | POA: Insufficient documentation

## 2015-04-30 DIAGNOSIS — D123 Benign neoplasm of transverse colon: Secondary | ICD-10-CM | POA: Insufficient documentation

## 2015-04-30 DIAGNOSIS — F329 Major depressive disorder, single episode, unspecified: Secondary | ICD-10-CM | POA: Diagnosis not present

## 2015-04-30 DIAGNOSIS — J449 Chronic obstructive pulmonary disease, unspecified: Secondary | ICD-10-CM | POA: Insufficient documentation

## 2015-04-30 HISTORY — DX: Headache, unspecified: R51.9

## 2015-04-30 HISTORY — DX: Headache: R51

## 2015-04-30 HISTORY — DX: Varicella without complication: B01.9

## 2015-04-30 HISTORY — DX: Unspecified viral hepatitis C without hepatic coma: B19.20

## 2015-04-30 HISTORY — DX: Unspecified cataract: H26.9

## 2015-04-30 HISTORY — PX: COLONOSCOPY WITH PROPOFOL: SHX5780

## 2015-04-30 HISTORY — DX: Chronic obstructive pulmonary disease, unspecified: J44.9

## 2015-04-30 HISTORY — DX: Depression, unspecified: F32.A

## 2015-04-30 HISTORY — DX: Other psychoactive substance dependence, uncomplicated: F19.20

## 2015-04-30 HISTORY — DX: Unspecified convulsions: R56.9

## 2015-04-30 HISTORY — DX: Major depressive disorder, single episode, unspecified: F32.9

## 2015-04-30 SURGERY — COLONOSCOPY WITH PROPOFOL
Anesthesia: General

## 2015-04-30 MED ORDER — SODIUM CHLORIDE 0.9 % IV SOLN: INTRAVENOUS | Status: DC

## 2015-04-30 MED ORDER — PROPOFOL 10 MG/ML IV BOLUS
INTRAVENOUS | Status: DC | PRN
  Administered 2015-04-30: 40 mg via INTRAVENOUS
  Administered 2015-04-30 (×2): 30 mg via INTRAVENOUS
  Administered 2015-04-30: 60 mg via INTRAVENOUS

## 2015-04-30 MED ORDER — FENTANYL CITRATE (PF) 100 MCG/2ML IJ SOLN
INTRAMUSCULAR | Status: DC | PRN
  Administered 2015-04-30: 50 ug via INTRAVENOUS

## 2015-04-30 MED ORDER — PROPOFOL 500 MG/50ML IV EMUL
INTRAVENOUS | Status: DC | PRN
  Administered 2015-04-30: 200 ug/kg/min via INTRAVENOUS

## 2015-04-30 MED ORDER — GLYCOPYRROLATE 0.2 MG/ML IJ SOLN
INTRAMUSCULAR | Status: DC | PRN
  Administered 2015-04-30: 0.2 mg via INTRAVENOUS

## 2015-04-30 MED ORDER — LIDOCAINE HCL (CARDIAC) 20 MG/ML IV SOLN
INTRAVENOUS | Status: DC | PRN
  Administered 2015-04-30: 60 mg via INTRAVENOUS

## 2015-04-30 MED ORDER — SODIUM CHLORIDE 0.9 % IV SOLN
INTRAVENOUS | Status: DC
  Administered 2015-04-30: 09:00:00 via INTRAVENOUS

## 2015-04-30 MED ORDER — MIDAZOLAM HCL 2 MG/2ML IJ SOLN
INTRAMUSCULAR | Status: DC | PRN
  Administered 2015-04-30: 1 mg via INTRAVENOUS

## 2015-04-30 NOTE — H&P (Signed)
  Primary Care Physician:  WHITE, Orlene Och, NP  Pre-Procedure History & Physical: HPI:  Natalie [name] is a 59 y.o. female is here for an colonoscopy.   Past Medical History  Diagnosis Date  . COPD (chronic obstructive pulmonary disease) ([place])   . Depression   . Seizures ([place])   . Headache   . Hepatitis C virus infection   . Chickenpox cataracts  . Cataracts, bilateral   . Drug addiction [facility])     Past Surgical History  Procedure Laterality Date  . Appendectomy    . Eye surgery    . Cholecystectomy      Prior to Admission medications   Medication Sig Start Date End Date Taking? Authorizing Provider  acetaminophen (TYLENOL) 500 MG tablet Take 500 mg by mouth every 6 (six) hours as needed.   Yes Historical Provider, MD  albuterol (PROVENTIL HFA;VENTOLIN HFA) 108 (90 BASE) MCG/ACT inhaler Inhale 2 puffs into the lungs every 6 (six) hours as needed for wheezing or shortness of breath.   Yes Historical Provider, MD  gabapentin (NEURONTIN) 300 MG capsule Take 300 mg by mouth 3 (three) times daily.   Yes Historical Provider, MD  levETIRAcetam (KEPPRA) 1000 MG tablet Take 1,000 mg by mouth 2 (two) times daily. Take one tab in the morning and two tabs at night   Yes Historical Provider, MD  methadone (DOLOPHINE) 10 MG tablet Take 10 mg by mouth every 8 (eight) hours.   Yes Historical Provider, MD  mirtazapine (REMERON) 30 MG tablet Take 30 mg by mouth at bedtime.   Yes Historical Provider, MD  Umeclidinium-Vilanterol (ANORO ELLIPTA) 62.5-25 MCG/INH AEPB Inhale 1 mcg into the lungs daily.   Yes Historical Provider, MD    Allergies as of 04/04/2015  . (Not on File)    History reviewed. No pertinent family history.  Social History   Social History  . Marital Status: Married    Spouse Name: N/A  . Number of Children: N/A  . Years of Education: N/A   Occupational History  . Not on file.   Social History Main Topics  . Smoking status: Current Every Day Smoker -- 1.00  packs/day    Types: Cigarettes  . Smokeless tobacco: Not on file  . Alcohol Use: No  . Drug Use: No  . Sexual Activity: Not on file   Other Topics Concern  . Not on file   Social History Narrative     Physical Exam: BP 125/58 mmHg  Pulse 77  Temp(Src) 98 F (36.7 C) (Tympanic)  Resp 12  Ht 5\' 3"  (1.6 m)  Wt 56.7 kg (125 lb)  BMI 22.15 kg/m2  SpO2 100% General:   Alert,  pleasant and cooperative in NAD Head:  Normocephalic and atraumatic. Neck:  Supple; no masses or thyromegaly. Lungs:  Clear throughout to auscultation.    Heart:  Regular rate and rhythm. Abdomen:  Soft, nontender and nondistended. Normal bowel sounds, without guarding, and without rebound.   Neurologic:  Alert and  oriented x4;  grossly normal neurologically.  Impression/Plan: Natalie [name] is here for an colonoscopy to be performed for screening, +father with colon polyps  Risks, benefits, limitations, and alternatives regarding  colonoscopy have been reviewed with the patient.  Questions have been answered.  All parties agreeable.   Josefine Class, MD  04/30/2015, 9:04 AM

## 2015-04-30 NOTE — Discharge Instructions (Signed)

## 2015-04-30 NOTE — Op Note (Signed)
[facility] Patient Name: Natalie [name] Procedure Date: 04/30/2015 9:05 AM MRN: [id number] Account #: [id number] Date of Birth: March 23, 1956 Admit Type: Outpatient Age: 59 Room: [facility] ENDO ROOM 2 Gender: Female Note Status: Finalized Procedure:         Colonoscopy Indications:       Colon cancer screening in patient at increased risk:                     Family history of colon polyps, , This is the patient's                     first colonoscopy(father) Patient Profile:   This is a 59 year old female. Providers:         Gerrit Heck. Rayann Heman, MD Referring MD:      Dani Gobble. White, MD (Referring MD) Medicines:         Propofol per Anesthesia Complications:     No immediate complications. Procedure:         Pre-Anesthesia Assessment:                    - Prior to the procedure, a History and Physical was                     performed, and patient medications, allergies and                     sensitivities were reviewed. The patient's tolerance of                     previous anesthesia was reviewed.                    After obtaining informed consent, the colonoscope was                     passed under direct vision. Throughout the procedure, the                     patient's blood pressure, pulse, and oxygen saturations                     were monitored continuously. The Colonoscope was                     introduced through the anus and advanced to the the cecum,                     identified by appendiceal orifice and ileocecal valve. The                     colonoscopy was performed without difficulty. The patient                     tolerated the procedure well. The quality of the bowel                     preparation was fair at best. Lot of thick lliquid. Findings:      The perianal and digital rectal examinations were normal.      A 3 mm polyp was found in the ascending colon. The polyp was sessile.       The polyp was removed with a jumbo  cold forceps. Resection and retrieval       were complete.  Four sessile polyps were found at the hepatic flexure. The polyps were 2       to 5 mm in size. These polyps were removed with a cold snare. Resection       and retrieval were complete.      Three sessile polyps were found in the transverse colon. The polyps were       2 to 4 mm in size. These polyps were removed with a cold snare.       Resection and retrieval were complete.      A 5 mm polyp was found in the rectum. The polyp was sessile. The polyp       was removed with a cold snare. Resection and retrieval were complete.      Three sessile polyps were found in the rectum. The polyps were 2 to 3 mm       in size. These polyps were removed with a jumbo cold forceps. Resection       and retrieval were complete.      The exam was otherwise without abnormality on direct and retroflexion       views. Impression:        - One 3 mm polyp in the ascending colon. Resected and                     retrieved.                    - Four 2 to 5 mm polyps at the hepatic flexure. Resected                     and retrieved.                    - Three 2 to 4 mm polyps in the transverse colon. Resected                     and retrieved.                    - One 5 mm polyp in the rectum. Resected and retrieved.                    - Three 2 to 3 mm polyps in the rectum. Resected and                     retrieved.                    - The examination was otherwise normal on direct and                     retroflexion views. Recommendation:    - Observe patient in GI recovery unit.                    - High fiber diet.                    - Continue present medications.                    - Await pathology results.                    - Repeat colonoscopy in 1 year because the bowel  preparation was fair to poor with thick liquid. Prep was                     fair after lavage and cleaning.                    - Return to  referring physician.                    - Return to liver clinic to discuss treatment of hepatitis                     C.                    - The findings and recommendations were discussed with the                     patient.                    - The findings and recommendations were discussed with the                     patient's family. Procedure Code(s): --- Professional ---                    [phone], Colonoscopy, flexible; with removal of tumor(s),                     polyp(s), or other lesion(s) by snare technique                    87579, 1, Colonoscopy, flexible; with biopsy, single or                     multiple CPT copyright 2014 American Medical Association. All rights reserved. The codes documented in this report are preliminary and upon coder review may  be revised to meet current compliance requirements. Mellody Life, MD 04/30/2015 10:05:45 AM This report has been signed electronically. Number of Addenda: 0 Note Initiated On: 04/30/2015 9:05 AM Scope Withdrawal Time: 0 hours 27 minutes 49 seconds  Total Procedure Duration: 0 hours 43 minutes 1 second       [facility]

## 2015-04-30 NOTE — Anesthesia Preprocedure Evaluation (Signed)
Anesthesia Evaluation  Patient identified by MRN, date of birth, ID band Patient awake    Reviewed: Allergy & Precautions, H&P , NPO status , Patient's Chart, lab work & pertinent test results, reviewed documented beta blocker date and time   History of Anesthesia Complications Negative for: history of anesthetic complications  Airway Mallampati: III  TM Distance: >3 FB Neck ROM: full    Dental no notable dental hx. 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)  , Hepatitis -, C  Endo/Other  negative endocrine ROS  Renal/GU negative Renal ROS  negative genitourinary   Musculoskeletal   Abdominal   Peds  Hematology negative hematology ROS (+)   Anesthesia Other Findings Past Medical History:   COPD (chronic obstructive pulmonary disease) (*              Depression                                                   Seizures ([facility])                                               Headache                                                     Hepatitis C virus infection                                  Chickenpox                                      cataracts    Cataracts, bilateral                                         Drug addiction ([place])                                         Reproductive/Obstetrics negative OB ROS                             Anesthesia Physical Anesthesia Plan  ASA: IV  Anesthesia Plan:  General   Post-op Pain Management:    Induction:   Airway Management Planned:  Additional Equipment:   Intra-op Plan:   Post-operative Plan:   Informed Consent: I have reviewed the patients History and Physical, chart, labs and discussed the procedure including the risks, benefits and alternatives for the proposed anesthesia with the patient or authorized representative who has indicated his/her understanding and acceptance.   Dental Advisory Given  Plan Discussed with: Anesthesiologist, CRNA and Surgeon  Anesthesia Plan Comments:         Anesthesia Quick Evaluation

## 2015-04-30 NOTE — Transfer of Care (Signed)
Immediate Anesthesia Transfer of Care Note  [name]  Procedure(s) Performed: Procedure(s): COLONOSCOPY WITH PROPOFOL (N/A)  Patient Location: PACU  Anesthesia Type:General  Level of Consciousness: awake, alert  and oriented  Airway & Oxygen Therapy: Patient Spontanous Breathing and Patient connected to nasal cannula oxygen  Post-op Assessment: Report given to RN and Post -op Vital signs reviewed and stable  Post vital signs: stable  Last Vitals:  Filed Vitals:   04/30/15 1010  BP: 150/85  Pulse: 96  Temp: 35.6 C  Resp: 13    Complications: No apparent anesthesia complications

## 2015-04-30 NOTE — Anesthesia Postprocedure Evaluation (Signed)
  Anesthesia Post-op Note  [name]  Procedure(s) Performed: Procedure(s): COLONOSCOPY WITH PROPOFOL (N/A)  Anesthesia type:General  Patient location: PACU  Post pain: Pain level controlled  Post assessment: Post-op Vital signs reviewed, Patient's Cardiovascular Status Stable, Respiratory Function Stable, Patent Airway and No signs of Nausea or vomiting  Post vital signs: Reviewed and stable  Last Vitals:  Filed Vitals:   04/30/15 1040  BP: 141/72  Pulse: 79  Temp:   Resp: 14    Level of consciousness: awake, alert  and patient cooperative  Complications: No apparent anesthesia complications

## 2015-05-01 LAB — SURGICAL PATHOLOGY

## 2015-05-03 ENCOUNTER — Encounter: Payer: Self-pay | Admitting: Gastroenterology

## 2015-05-29 ENCOUNTER — Other Ambulatory Visit: Payer: Self-pay | Admitting: Gastroenterology

## 2015-05-29 DIAGNOSIS — B182 Chronic viral hepatitis C: Secondary | ICD-10-CM

## 2015-06-06 ENCOUNTER — Ambulatory Visit
Admission: RE | Admit: 2015-06-06 | Discharge: 2015-06-06 | Disposition: A | Discharge status: Home / Self Care | Payer: 59 | Source: Ambulatory Visit | Attending: Gastroenterology | Admitting: Gastroenterology

## 2015-06-06 ENCOUNTER — Ambulatory Visit: Admission: RE | Admit: 2015-06-06 | Payer: 59 | Source: Ambulatory Visit

## 2015-06-06 DIAGNOSIS — B182 Chronic viral hepatitis C: Secondary | ICD-10-CM | POA: Diagnosis not present

## 2015-06-06 DIAGNOSIS — Z9049 Acquired absence of other specified parts of digestive tract: Secondary | ICD-10-CM | POA: Insufficient documentation

## 2015-10-27 ENCOUNTER — Ambulatory Visit
Admission: EM | Admit: 2015-10-27 | Discharge: 2015-10-27 | Disposition: A | Discharge status: Home / Self Care | Payer: 59 | Attending: Family Medicine | Admitting: Family Medicine

## 2015-10-27 ENCOUNTER — Encounter: Payer: Self-pay | Admitting: *Deleted

## 2015-10-27 ENCOUNTER — Ambulatory Visit (INDEPENDENT_AMBULATORY_CARE_PROVIDER_SITE_OTHER): Payer: 59

## 2015-10-27 DIAGNOSIS — S82842A Displaced bimalleolar fracture of left lower leg, initial encounter for closed fracture: Secondary | ICD-10-CM | POA: Diagnosis not present

## 2015-10-27 MED ORDER — MELOXICAM 7.5 MG PO TABS: 7.5000 mg | ORAL_TABLET | Freq: Every day | ORAL | Status: AC

## 2015-10-27 MED ORDER — KETOROLAC TROMETHAMINE 60 MG/2ML IM SOLN
60.0000 mg | Freq: Once | INTRAMUSCULAR | Status: AC
  Administered 2015-10-27: 60 mg via INTRAMUSCULAR

## 2015-10-27 NOTE — ED Provider Notes (Signed)
CSN: [id number]     Arrival date & time 10/27/15  1002 History   First MD Initiated Contact with Patient 10/27/15 1106     Chief Complaint  Patient presents with  . Ankle Pain   (Consider location/radiation/quality/duration/timing/severity/associated sxs/prior Treatment) HPI Comments: Single caucasian female here for evaluation left ankle pain s/p fall hyperextended at 2330 last night when stepping out of bed has tried ice and elevating.  Worse pain with weight bearing walking only on heel.  Slid on butt down stairs instead of walking today due to pain lives in 2 story house bathroom on 2nd floor.  In wheelchair on arrival to [facility], medicaid methadone patient.  Denied previous left leg/foot/ankle fractures.  Unable to drive due to seizure disorder.  Patient is a 60 y.o. female presenting with ankle pain. The history is provided by the patient.  Ankle Pain Location:  Ankle Time since incident:  12 hours Injury: yes   Mechanism of injury: fall   Fall:    Fall occurred:  Standing   Impact surface:  Hard floor   Point of impact:  Hands   Entrapped after fall: no   Ankle location:  L ankle Pain details:    Quality:  Aching and throbbing   Radiates to:  Does not radiate   Severity:  Moderate   Onset quality:  Sudden   Duration:  12 hours   Timing:  Constant   Progression:  Worsening Chronicity:  New Dislocation: no   Relieved by:  Nothing Worsened by:  Activity and bearing weight Ineffective treatments:  Ice and immobilization Associated symptoms: decreased ROM, stiffness and swelling   Associated symptoms: no back pain, no fatigue, no fever, no itching, no muscle weakness, no neck pain, no numbness and no tingling   Risk factors: no concern for non-accidental trauma, no frequent fractures, no known bone disorder, no obesity and no recent illness     Past Medical History  Diagnosis Date  . COPD (chronic obstructive pulmonary disease) ([place])   . Depression   . Seizures ([place])   .  Headache   . Hepatitis C virus infection   . Chickenpox cataracts  . Cataracts, bilateral   . Drug addiction [facility])    Past Surgical History  Procedure Laterality Date  . Appendectomy    . Eye surgery    . Cholecystectomy    . Colonoscopy with propofol N/A 04/30/2015    Procedure: COLONOSCOPY WITH PROPOFOL;  Surgeon: Josefine Class, MD;  Location: [facility] ENDOSCOPY;  Service: Endoscopy;  Laterality: N/A;   History reviewed. No pertinent family history. Social History  Substance Use Topics  . Smoking status: Current Every Day Smoker -- 1.00 packs/day    Types: Cigarettes  . Smokeless tobacco: None  . Alcohol Use: No   OB History    No data available     Review of Systems  Constitutional: Negative for fever, chills, diaphoresis, activity change, appetite change and fatigue.  HENT: Negative for congestion, ear pain and sore throat.   Eyes: Negative for pain and discharge.  Respiratory: Negative for cough and wheezing.   Cardiovascular: Negative for chest pain and leg swelling.  Gastrointestinal: Negative for nausea, vomiting, diarrhea, constipation and blood in stool.  Endocrine: Negative for cold intolerance and heat intolerance.  Genitourinary: Negative for dysuria, hematuria and difficulty urinating.  Musculoskeletal: Positive for myalgias, joint swelling, gait problem and stiffness. Negative for back pain, arthralgias, neck pain and neck stiffness.  Skin: Negative for color change, itching, pallor, rash  and wound.  Allergic/Immunologic: Negative for environmental allergies and food allergies.  Neurological: Negative for dizziness, tremors, seizures, syncope, facial asymmetry, speech difficulty, weakness, light-headedness, numbness and headaches.  Hematological: Negative for adenopathy. Does not bruise/bleed easily.  Psychiatric/Behavioral: Positive for sleep disturbance. Negative for behavioral problems, confusion and agitation. The patient is not nervous/anxious.      Allergies  Aspirin; Darvon; Iodine; Penicillins; Sulfa antibiotics; and Zithromax  Home Medications   Prior to Admission medications   Medication Sig Start Date End Date Taking? Authorizing Provider  acetaminophen (TYLENOL) 500 MG tablet Take 500 mg by mouth every 6 (six) hours as needed.   Yes Historical Provider, MD  albuterol (PROVENTIL HFA;VENTOLIN HFA) 108 (90 BASE) MCG/ACT inhaler Inhale 2 puffs into the lungs every 6 (six) hours as needed for wheezing or shortness of breath.   Yes Historical Provider, MD  gabapentin (NEURONTIN) 300 MG capsule Take 300 mg by mouth 3 (three) times daily.   Yes Historical Provider, MD  levETIRAcetam (KEPPRA) 1000 MG tablet Take 1,000 mg by mouth 2 (two) times daily. Take one tab in the morning and two tabs at night   Yes Historical Provider, MD  methadone (DOLOPHINE) 10 MG tablet Take 10 mg by mouth every 8 (eight) hours.   Yes Historical Provider, MD  mirtazapine (REMERON) 30 MG tablet Take 30 mg by mouth at bedtime.   Yes Historical Provider, MD  Umeclidinium-Vilanterol (ANORO ELLIPTA) 62.5-25 MCG/INH AEPB Inhale 1 mcg into the lungs daily.    Historical Provider, MD   Meds Ordered and Administered this Visit  Medications - No data to display  BP 118/62 mmHg  Pulse 67  Temp(Src) 98.1 F (36.7 C) (Oral)  Resp 16  Ht 5\' 3"  (1.6 m)  Wt 113 lb (51.256 kg)  BMI 20.02 kg/m2  SpO2 99% No data found.   Physical Exam  Constitutional: She is oriented to person, place, and time. Vital signs are normal. She appears well-developed and well-nourished. She is active and cooperative.  Non-toxic appearance. She does not have a sickly appearance. She does not appear ill. No distress.  HENT:  Head: Normocephalic and atraumatic.  Right Ear: Hearing, external ear and ear canal normal.  Left Ear: Hearing, external ear and ear canal normal.  Nose: Nose normal. No mucosal edema, rhinorrhea, nose lacerations, sinus tenderness, nasal deformity, septal deviation  or nasal septal hematoma. No epistaxis.  No foreign bodies.  Mouth/Throat: Uvula is midline, oropharynx is clear and moist and mucous membranes are normal. No oropharyngeal exudate.  Eyes: Conjunctivae, EOM and lids are normal. Pupils are equal, round, and reactive to light. Right eye exhibits no chemosis, no discharge, no exudate and no hordeolum. No foreign body present in the right eye. Left eye exhibits no chemosis, no discharge, no exudate and no hordeolum. No foreign body present in the left eye. No scleral icterus.  Neck: Trachea normal and normal range of motion. Neck supple. No rigidity. No tracheal deviation, no edema, no erythema and normal range of motion present. No thyromegaly present.  Cardiovascular: Normal rate, regular rhythm, normal heart sounds and intact distal pulses.  Exam reveals no gallop, no friction rub and no decreased pulses.   No murmur heard. Pulses:      Dorsalis pedis pulses are 2+ on the right side, and 2+ on the left side.       Posterior tibial pulses are 2+ on the right side, and 2+ on the left side.  Pulmonary/Chest: Effort normal and breath sounds normal. No  stridor. No respiratory distress. She has no wheezes. She has no rhonchi. She has no rales. She exhibits no tenderness.  On home 02 2l [place]  Abdominal: Soft. She exhibits no distension.  Musculoskeletal: She exhibits edema and tenderness.       Right hip: Normal.       Left hip: Normal.       Right knee: Normal.       Left knee: Normal.       Right ankle: Normal.       Left ankle: She exhibits decreased range of motion and swelling. She exhibits no ecchymosis, no deformity, no laceration and normal pulse. Tenderness. Lateral malleolus and medial malleolus tenderness found. No AITFL, no CF ligament, no posterior TFL and no head of 5th metatarsal tenderness found. Achilles tendon normal. Achilles tendon exhibits no pain, no defect and normal Thompson's test results.       Right lower leg: Normal.       Left  lower leg: She exhibits tenderness, bony tenderness, swelling and edema. She exhibits no deformity and no laceration.       Legs:      Right foot: Normal.       Left foot: Normal.  Lymphadenopathy:    She has no cervical adenopathy.  Neurological: She is alert and oriented to person, place, and time. She is not disoriented. She displays no atrophy and no tremor. No sensory deficit. She exhibits normal muscle tone. She displays no seizure activity. Coordination normal. GCS eye subscore is 4. GCS verbal subscore is 5. GCS motor subscore is 6.  Skin: Skin is warm, dry and intact. No abrasion, no bruising, no burn, no ecchymosis, no laceration, no lesion, no petechiae and no rash noted. She is not diaphoretic. No cyanosis or erythema. No pallor. Nails show no clubbing.  Psychiatric: She has a normal mood and affect. Her speech is normal and behavior is normal. Judgment and thought content normal. Cognition and memory are normal.  Nursing note and vitals reviewed.   ED Course  Procedures (including critical care time)  Labs Review Labs Reviewed - No data to display  Imaging Review Dg Ankle Complete Left  10/27/2015  CLINICAL DATA:  [place] out of bed on ankle yesterday. Ankle injury and pain. Initial encounter. EXAM: LEFT ANKLE COMPLETE - 3+ VIEW COMPARISON:  None. FINDINGS: Lateral soft tissue swelling is seen. Minimally displaced oblique fracture of the distal fibula is seen just above the level of the tibial plafond. No other fractures are identified. No evidence of dislocation. IMPRESSION: Minimally displaced oblique fracture of distal fibula. Electronically Signed   By: Earle Gell M.D.   On: 10/27/2015 11:39   1140 reviewed xray results with patient placed in cam boot and crutch training completed by RN Milinda Antis.  Patient to follow up with [facility] Monday as Medicaid requires [facility] referral for orthopedics treatment. Non weight bearing, cryotherapy, continue chronic pain medications, elevate when  sitting. Patient verbalized understanding of information/instructions, agreed with plan of care.   1206 given copies of images and discharge instructions/printout.  Patient verbalized understanding of information/instructions and had no further questions at this time.   1210 requested pain medication discussed history decreased GFR 57 last chemistry [facility], Hx hepatitis C, methadone, gabapentin and keppra use.  Case discussed with Dr Alveta Heimlich.  Patient has used toradol in the past for migraines with good relief pain.  Will do 60mg  injection today in clinic.  Repeat blood pressure in 15 mintues.  Slow position  changes.  May start mobic 7.5mg  po BID tomorrow prn until seen by [facility] and orthopedics.  Patient verbalized agreement and understanding of treatment plan and had no further questions at this time.  Today's Vitals   10/27/15 1046 10/27/15 1230  BP: 118/62 141/68  Pulse: 67 67  Temp: 98.1 F (36.7 C) 97.7 F (36.5 C)  TempSrc: Oral Oral  Resp: 16 16  Height: 5\' 3"  (1.6 m)   Weight: 113 lb (51.256 kg)   SpO2: 99% 97%   1226 Toradol 60mg  IM administered by RN Milinda Antis.  VSS on discharge via wheelchair with family driver.  Patient reported pain improved will start mobic tomorrow if needed for pain avoid motrin/naproxen/mobic x24 hours s/p toradol injection Pt agreed with plan of care and had no further questions at this time.  MDM   1. Pott's fracture (of distal fibula), left, closed, initial encounter    Patient was instructed to rest, ice and elevate the ankle as much as possible.  Wear cam boot.  Nonweight bearing left, crutches or walker use.  Follow up with [facility] for orthopedics referral Monday 29 Oct 2015 as Medicaid patient.  Continue cryotherapy at least QID x 15 minutes.  Patient on chronic pain medications including neurontin and methadone will not be adding others at this time.  See note from pain MD scanned into chart.  Discussed at risk to reinjure ankle over the next year and to  wear supportive footwear/ankle sleeve/ace bandage.    Patient verbalized agreement and understanding of treatment plan and had no further questions at this time.   P2:  Injury Prevention and Fitness.    Olen Cordial, NP 10/27/15 1459

## 2015-10-27 NOTE — Discharge Instructions (Signed)
Ankle Fracture A fracture is a break in a bone. The ankle joint is made up of three bones. These include the lower (distal)sections of your lower leg bones, called the tibia and fibula, along with a bone in your foot, called the talus. Depending on how bad the break is and if more than one ankle joint bone is broken, a cast or splint is used to protect and keep your injured bone from moving while it heals. Sometimes, surgery is required to help the fracture heal properly.  There are two general types of fractures:  Stable fracture. This includes a single fracture line through one bone, with no injury to ankle ligaments. A fracture of the talus that does not have any displacement (movement of the bone on either side of the fracture line) is also stable.  Unstable fracture. This includes more than one fracture line through one or more bones in the ankle joint. It also includes fractures that have displacement of the bone on either side of the fracture line. CAUSES  A direct blow to the ankle.   Quickly and severely twisting your ankle.  Trauma, such as a car accident or falling from a significant height. RISK FACTORS You may be at a higher risk of ankle fracture if:  You have certain medical conditions.  You are involved in high-impact sports.  You are involved in a high-impact car accident. SIGNS AND SYMPTOMS   Tender and swollen ankle.  Bruising around the injured ankle.  Pain on movement of the ankle.  Difficulty walking or putting weight on the ankle.  A cold foot below the site of the ankle injury. This can occur if the blood vessels passing through your injured ankle were also damaged.  Numbness in the foot below the site of the ankle injury. DIAGNOSIS  An ankle fracture is usually diagnosed with a physical exam and X-rays. A CT scan may also be required for complex fractures. TREATMENT  Stable fractures are treated with a cast or splint and using crutches to avoid putting  weight on your injured ankle. This is followed by an ankle strengthening program. Some patients require a special type of cast, depending on other medical problems they may have. Unstable fractures require surgery to ensure the bones heal properly. Your health care provider will tell you what type of fracture you have and the best treatment for your condition. HOME CARE INSTRUCTIONS   Review correct crutch use with your health care provider and use your crutches as directed. Safe use of crutches is extremely important. Misuse of crutches can cause you to fall or cause injury to nerves in your hands or armpits.  Do not put weight or pressure on the injured ankle until directed by your health care provider.  To lessen the swelling, keep the injured leg elevated while sitting or lying down.  Apply ice to the injured area:  Put ice in a plastic bag.  Place a towel between your cast and the bag.  Leave the ice on for 20 minutes, 2-3 times a day.  If you have a plaster or fiberglass cast:  Do not try to scratch the skin under the cast with any objects. This can increase your risk of skin infection.  Check the skin around the cast every day. You may put lotion on any red or sore areas.  Keep your cast dry and clean.  If you have a plaster splint:  Wear the splint as directed.  You may loosen the elastic  around the splint if your toes become numb, tingle, or turn cold or blue. °· Do not put pressure on any part of your cast or splint; it may break. Rest your cast only on a pillow the first 24 hours until it is fully hardened. °· Your cast or splint can be protected during bathing with a plastic bag sealed to your skin with medical tape. Do not lower the cast or splint into water. °· Take medicines as directed by your health care provider. Only take over-the-counter or prescription medicines for pain, discomfort, or fever as directed by your health care provider. °· Do not drive a vehicle until  your health care provider specifically tells you it is safe to do so. °· If your health care provider has given you a follow-up appointment, it is very important to keep that appointment. Not keeping the appointment could result in a chronic or permanent injury, pain, and disability. If you have any problem keeping the appointment, call the facility for assistance. °SEEK MEDICAL CARE IF: °You develop increased swelling or discomfort. °SEEK IMMEDIATE MEDICAL CARE IF:  °· Your cast gets damaged or breaks. °· You have continued severe pain. °· You develop new pain or swelling after the cast was put on. °· Your skin or toenails below the injury turn blue or gray. °· Your skin or toenails below the injury feel cold, numb, or have loss of sensitivity to touch. °· There is a bad smell or pus draining from under the cast. °MAKE SURE YOU:  °· Understand these instructions. °· Will watch your condition. °· Will get help right away if you are not doing well or get worse. °  °This information is not intended to replace advice given to you by your health care provider. Make sure you discuss any questions you have with your health care provider. °  °Document Released: 06/20/2000 Document Revised: 06/28/2013 Document Reviewed: 01/20/2013 °Elsevier Interactive Patient Education ©2016 [employer] ° °Cast or Splint Care °Casts and splints support injured limbs and keep bones from moving while they heal. It is important to care for your cast or splint at home.   °HOME CARE INSTRUCTIONS °· Keep the cast or splint uncovered during the drying period. It can take 24 to 48 hours to dry if it is made of plaster. A fiberglass cast will dry in less than 1 hour. °· Do not rest the cast on anything harder than a pillow for the first 24 hours. °· Do not put weight on your injured limb or apply pressure to the cast until your health care provider gives you permission. °· Keep the cast or splint dry. Wet casts or splints can lose their shape and  may not support the limb as well. A wet cast that has lost its shape can also create harmful pressure on your skin when it dries. Also, wet skin can become infected. °¨ Cover the cast or splint with a plastic bag when bathing or when out in the rain or snow. If the cast is on the trunk of the body, take sponge baths until the cast is removed. °¨ If your cast does become wet, dry it with a towel or a blow dryer on the cool setting only. °· Keep your cast or splint clean. Soiled casts may be wiped with a moistened cloth. °· Do not place any hard or soft foreign objects under your cast or splint, such as cotton, toilet paper, lotion, or powder. °· Do not try to scratch the   skin under the cast with any object. The object could get stuck inside the cast. Also, scratching could lead to an infection. If itching is a problem, use a blow dryer on a cool setting to relieve discomfort.  Do not trim or cut your cast or remove padding from inside of it.  Exercise all joints next to the injury that are not immobilized by the cast or splint. For example, if you have a long leg cast, exercise the hip joint and toes. If you have an arm cast or splint, exercise the shoulder, elbow, thumb, and fingers.  Elevate your injured arm or leg on 1 or 2 pillows for the first 1 to 3 days to decrease swelling and pain.It is best if you can comfortably elevate your cast so it is higher than your heart. SEEK MEDICAL CARE IF:   Your cast or splint cracks.  Your cast or splint is too tight or too loose.  You have unbearable itching inside the cast.  Your cast becomes wet or develops a soft spot or area.  You have a bad smell coming from inside your cast.  You get an object stuck under your cast.  Your skin around the cast becomes red or raw.  You have new pain or worsening pain after the cast has been applied. SEEK IMMEDIATE MEDICAL CARE IF:   You have fluid leaking through the cast.  You are unable to move your fingers  or toes.  You have discolored (blue or white), cool, painful, or very swollen fingers or toes beyond the cast.  You have tingling or numbness around the injured area.  You have severe pain or pressure under the cast.  You have any difficulty with your breathing or have shortness of breath.  You have chest pain.   This information is not intended to replace advice given to you by your health care provider. Make sure you discuss any questions you have with your health care provider.   Document Released: 06/20/2000 Document Revised: 04/13/2013 Document Reviewed: 12/30/2012 Elsevier Interactive Patient Education 2016 [employer]. Cryotherapy Cryotherapy means treatment with cold. Ice or gel packs can be used to reduce both pain and swelling. Ice is the most helpful within the first 24 to 48 hours after an injury or flare-up from overusing a muscle or joint. Sprains, strains, spasms, burning pain, shooting pain, and aches can all be eased with ice. Ice can also be used when recovering from surgery. Ice is effective, has very few side effects, and is safe for most people to use. PRECAUTIONS  Ice is not a safe treatment option for people with:  Raynaud phenomenon. This is a condition affecting small blood vessels in the extremities. Exposure to cold may cause your problems to return.  Cold hypersensitivity. There are many forms of cold hypersensitivity, including:  Cold urticaria. Red, itchy hives appear on the skin when the tissues begin to warm after being iced.  Cold erythema. This is a red, itchy rash caused by exposure to cold.  Cold hemoglobinuria. Red blood cells break down when the tissues begin to warm after being iced. The hemoglobin that carry oxygen are passed into the urine because they cannot combine with blood proteins fast enough.  Numbness or altered sensitivity in the area being iced. If you have any of the following conditions, do not use ice until you have discussed  cryotherapy with your caregiver:  Heart conditions, such as arrhythmia, angina, or chronic heart disease.  High blood pressure.  Healing wounds or open skin in the area being iced.  Current infections.  Rheumatoid arthritis.  Poor circulation.  Diabetes. Ice slows the blood flow in the region it is applied. This is beneficial when trying to stop inflamed tissues from spreading irritating chemicals to surrounding tissues. However, if you expose your skin to cold temperatures for too long or without the proper protection, you can damage your skin or nerves. Watch for signs of skin damage due to cold. HOME CARE INSTRUCTIONS Follow these tips to use ice and cold packs safely.  Place a dry or damp towel between the ice and skin. A damp towel will cool the skin more quickly, so you may need to shorten the time that the ice is used.  For a more rapid response, add gentle compression to the ice.  Ice for no more than 10 to 20 minutes at a time. The bonier the area you are icing, the less time it will take to get the benefits of ice.  Check your skin after 5 minutes to make sure there are no signs of a poor response to cold or skin damage.  Rest 20 minutes or more between uses.  Once your skin is numb, you can end your treatment. You can test numbness by very lightly touching your skin. The touch should be so light that you do not see the skin dimple from the pressure of your fingertip. When using ice, most people will feel these normal sensations in this order: cold, burning, aching, and numbness.  Do not use ice on someone who cannot communicate their responses to pain, such as small children or people with dementia. HOW TO MAKE AN ICE PACK Ice packs are the most common way to use ice therapy. Other methods include ice massage, ice baths, and cryosprays. Muscle creams that cause a cold, tingly feeling do not offer the same benefits that ice offers and should not be used as a substitute  unless recommended by your caregiver. To make an ice pack, do one of the following:  Place crushed ice or a bag of frozen vegetables in a sealable plastic bag. Squeeze out the excess air. Place this bag inside another plastic bag. Slide the bag into a pillowcase or place a damp towel between your skin and the bag.  Mix 3 parts water with 1 part rubbing alcohol. Freeze the mixture in a sealable plastic bag. When you remove the mixture from the freezer, it will be slushy. Squeeze out the excess air. Place this bag inside another plastic bag. Slide the bag into a pillowcase or place a damp towel between your skin and the bag. SEEK MEDICAL CARE IF:  You develop white spots on your skin. This may give the skin a blotchy (mottled) appearance.  Your skin turns blue or pale.  Your skin becomes waxy or hard.  Your swelling gets worse. MAKE SURE YOU:   Understand these instructions.  Will watch your condition.  Will get help right away if you are not doing well or get worse.   This information is not intended to replace advice given to you by your health care provider. Make sure you discuss any questions you have with your health care provider.   Document Released: 02/17/2011 Document Revised: 07/14/2014 Document Reviewed: 02/17/2011 Elsevier Interactive Patient Education 2016 [place] Use HOW TO TELL IF A WALKER IS THE RIGHT SIZE  With your arms hanging at your sides, the walker handles should be at wrist level. If  you cannot find the exact fit, choose the height that is most comfortable.  If you have been instructed to not place weight on one of your legs, you may feel more comfortable with a shorter height. If you are using the walker for balance, you may prefer a taller height.  Adjust the height by using the push buttons on the legs of your walker.  In rest position, the back leg of the walkers should be no further ahead than your toes. With your hands resting on the  grips, your elbows should be slightly bent at about a 30 degree angle.  Ask your physical therapist or caregiver if you have any concerns. HOW TO USE A STANDARD WALKER (NO WHEELS)  Pick your walker up (do not slide your walker) and place it one step length in front of you. The back legs of the walker should be no further ahead than your toes. You should not feel like you need to lean forward to keep your hands on the grips. As you set the walker down, make sure all 4 leg tips contact the ground at the same time.  Hold onto the walker for support and step forward with your weaker leg into the middle of the walker. Follow the weight bearing instructions your caregiver has given you.  Push down with your hands and step forward with your stronger leg.  Be careful not to let the walker get too far ahead of you as you walk.  Repeat the process for each step. HOW TO USE A FRONT-WHEELED WALKER  Slide your walker forward. The back legs of the walker should be no further ahead than your toes. You should not feel like you need to lean forward to keep your hands on the grips.  Hold onto the walker for support and step forward with your weaker leg into the middle of the walker. Follow the weight bearing instructions your caregiver has given you.  Push down with your hands and step forward with your stronger leg.  Be careful not to let the walker get too far ahead of you as you walk.  Repeat the process for each step.  If your walker does not glide well over carpet, consider cutting an "x" in 2 old tennis balls and placing them over the back legs of your walker. STANDING UP FROM A CHAIR WITH ARMRESTS  It is best to sit in a firm chair with armrests.  Position your walker directly in front of your chair. Do not pull on the walker when standing up. It is too unstable to support weight when pulled on.  Slide forward in the chair, with your weaker leg ahead and stronger leg bent near the  chair.  Lean forward and push up from your chair with both hands on the armrests. Straighten your stronger leg, rising to standing. Do not pull yourself up from the walker. This may cause it to tip.  When you feel steady on your feet, carefully move one hand at a time to the walker.  Stand for a few seconds to stabilize your balance before you start to walk. STANDING UP FROM A CHAIR WITHOUT ARMRESTS  It is best to sit in a firm chair. A low seat or an overstuffed chair or sofa is hard to get out of.  Place the walker in front of you. Do not pull on the walker when coming to a standing position.  Slide forward in the chair, with your weaker leg ahead and stronger  leg bent near the chair.  Push down on the chair seat with the hand opposite your weaker leg. Keep your other hand on the center of the walker's crossbar.  Stand, steady your balance, and place your hands on the walker handgrips. SITTING DOWN  Always back up toward your chair, using your walker, until you feel the back of your legs touch the chair.  If the chair has armrests, carefully reach back to put your hands on the armrests, and slowly lower your weight.  If the chair does not have armrests, consider backing up to the side of the chair. You can then hold onto the back of the chair and the front of the seat to slowly lower yourself.  You should never feel like you are falling into your chair. USING A WALKER ON STEPS   Before attempting to use your walker on steps, practice with your physical therapist.  If you are going up a step wide enough to accommodate the entire walker and yourself:  First, place the walker up on the step.  Second, get your feet as close to the step as you can.  Third, press down on the walker with your hands as you step up with your stronger leg. Then step up with your weaker leg.  If you are going down a step wide enough to accommodate the entire walker and yourself:  First, place the  walker down on the step.  Second, hold onto the walker as you step down with your weaker leg. Then step down with your stronger leg.  If you are going up more than 1 step and have a railing:  First, turn the walker sideways, so the opening is facing in toward you.  Second, place the front 2 legs of the walker on the first step. These front legs should be positioned at the base of the next step.  Third, test the steadiness of the walker. It should feel sturdy when you press down on the handgrip that is facing the top of the steps.  Finally, placing your weight on the railing and the walker, step up with your stronger leg first. Then step up with your weaker leg.  If you are going down more than 1 step and have a railing:  First, turn the walker sideways, so the opening is facing in toward you.  Second, place the front 2 legs of the walker down on the first step. When possible, the back legs of the walker should be positioned at the base of the previous step.  Third, test the steadiness of the walker. It should feel sturdy when you press down on the handgrip that is facing the top of the steps.  Finally, placing your weight on the railing and the walker, step down with your weaker leg first. Then step down with your stronger leg.  Be sure to check the sturdiness of the walker before each step.  Make sure you have good rubber tips on the legs of your walker to prevent it from slipping.   This information is not intended to replace advice given to you by your health care provider. Make sure you discuss any questions you have with your health care provider.   Document Released: 06/23/2005 Document Revised: 09/15/2011 Document Reviewed: 01/05/2015 Elsevier Interactive Patient Education 2016 [place] Use Crutches are used to take weight off one of your legs or feet when you stand or walk. It is important to use crutches that fit properly. When fitted  properly:  Each crutch  should be 2-3 finger widths below the armpit.  Your weight should be supported by your hand, and not by resting the armpit on the crutch. RISKS AND COMPLICATIONS Damage to the nerves that extend from your armpit to your hand and arm. To prevent this from happening, make sure your crutches fit properly and do not put pressure on your armpit when using them. HOW TO USE YOUR CRUTCHES If you have been instructed to use partial weight bearing, apply (bear) the amount of weight as your health care provider suggests. Do not bear weight in an amount that causes pain to the area of injury. Walking  Step with the crutches.  Swing the healthy leg slightly ahead of the crutches. Going Up Steps If there is no handrail:  Step up with the healthy leg.  Step up with the crutches and injured leg.  Continue in this way. If there is a handrail:  Hold both crutches in one hand.  Place your free hand on the handrail.  While putting your weight on your arms, lift your healthy leg to the step.  Bring the crutches and the injured leg up to that step.  Continue in this way. Going Down Steps Be very careful, as going down stairs with crutches is very challenging. If there is no handrail:  Step down with the injured leg and crutches.  Step down with the healthy leg. If there is a handrail:  Place your hand on the handrail.  Hold both crutches with your free hand.  Lower your injured leg and crutch to the step below you. Make sure to keep the crutch tips in the center of the step, never on the edge.  Lower your healthy leg to that step.  Continue in this way. Standing Up  Hold the injured leg forward.  Grab the armrest with one hand and the top of the crutches with the other hand.  Using these supports, pull yourself up to a standing position. Sitting Down 1. Hold the injured leg forward. 2. Grab the armrest with one hand and the top of the crutches with the other hand. 3. Lower  yourself to a sitting position. SEEK MEDICAL CARE IF:  You still feel unsteady on your feet.  You develop new pain, for example in your armpits, back, shoulder, wrist, or hip.  You develop any numbness or tingling. SEEK IMMEDIATE MEDICAL CARE IF:  You fall.   This information is not intended to replace advice given to you by your health care provider. Make sure you discuss any questions you have with your health care provider.   Document Released: 06/20/2000 Document Revised: 07/14/2014 Document Reviewed: 02/28/2013 Elsevier Interactive Patient Education [employer].

## 2015-10-27 NOTE — ED Notes (Signed)
Stepped out of bed today and twisted left ankle. States felt and heard loud "pop" and felt immediate pain in left ankle. Can bear only minimal weight on left ankle. Edema to left lateral malleolus region.

## 2016-02-20 ENCOUNTER — Other Ambulatory Visit: Payer: Self-pay | Admitting: Specialist

## 2016-02-20 DIAGNOSIS — R0902 Hypoxemia: Secondary | ICD-10-CM

## 2016-02-20 DIAGNOSIS — J439 Emphysema, unspecified: Secondary | ICD-10-CM

## 2016-02-20 DIAGNOSIS — R0609 Other forms of dyspnea: Secondary | ICD-10-CM

## 2016-02-20 DIAGNOSIS — F17200 Nicotine dependence, unspecified, uncomplicated: Secondary | ICD-10-CM

## 2016-02-26 ENCOUNTER — Ambulatory Visit
Admission: RE | Admit: 2016-02-26 | Discharge: 2016-02-26 | Disposition: A | Discharge status: Home / Self Care | Payer: 59 | Source: Ambulatory Visit | Attending: Specialist | Admitting: Specialist

## 2016-02-26 DIAGNOSIS — I2510 Atherosclerotic heart disease of native coronary artery without angina pectoris: Secondary | ICD-10-CM | POA: Insufficient documentation

## 2016-02-26 DIAGNOSIS — J439 Emphysema, unspecified: Secondary | ICD-10-CM

## 2016-02-26 DIAGNOSIS — R0902 Hypoxemia: Secondary | ICD-10-CM

## 2016-02-26 DIAGNOSIS — I700 Atherosclerosis of aorta: Secondary | ICD-10-CM | POA: Insufficient documentation

## 2016-02-26 DIAGNOSIS — R0609 Other forms of dyspnea: Secondary | ICD-10-CM | POA: Diagnosis not present

## 2016-02-26 DIAGNOSIS — F17200 Nicotine dependence, unspecified, uncomplicated: Secondary | ICD-10-CM | POA: Insufficient documentation

## 2016-03-20 ENCOUNTER — Other Ambulatory Visit: Payer: Self-pay | Admitting: Family Medicine

## 2016-03-20 DIAGNOSIS — Z1231 Encounter for screening mammogram for malignant neoplasm of breast: Secondary | ICD-10-CM

## 2016-03-24 ENCOUNTER — Ambulatory Visit
Admission: RE | Admit: 2016-03-24 | Discharge: 2016-03-24 | Disposition: A | Discharge status: Home / Self Care | Payer: 59 | Source: Ambulatory Visit | Attending: Family Medicine | Admitting: Family Medicine

## 2016-03-24 DIAGNOSIS — Z1231 Encounter for screening mammogram for malignant neoplasm of breast: Secondary | ICD-10-CM | POA: Diagnosis not present

## 2016-04-08 ENCOUNTER — Encounter: Payer: Self-pay | Admitting: Emergency Medicine

## 2016-04-08 ENCOUNTER — Ambulatory Visit
Admission: EM | Admit: 2016-04-08 | Discharge: 2016-04-08 | Disposition: A | Discharge status: Home / Self Care | Payer: 59 | Attending: Family Medicine | Admitting: Family Medicine

## 2016-04-08 DIAGNOSIS — T07XXXA Unspecified multiple injuries, initial encounter: Secondary | ICD-10-CM

## 2016-04-08 DIAGNOSIS — L980 Pyogenic granuloma: Secondary | ICD-10-CM | POA: Diagnosis not present

## 2016-04-08 MED ORDER — MUPIROCIN 2 % EX OINT: 1.0000 "application " | TOPICAL_OINTMENT | Freq: Three times a day (TID) | CUTANEOUS | 0 refills | Status: DC

## 2016-04-08 NOTE — ED Triage Notes (Signed)
Patient c/o painful knot on her left hand for a month.

## 2016-04-08 NOTE — ED Provider Notes (Signed)
CSN: [id number]     Arrival date & time 04/08/16  1106 History   First MD Initiated Contact with Patient 04/08/16 1200     Chief Complaint  Patient presents with  . Cyst   (Consider location/radiation/quality/duration/timing/severity/associated sxs/prior Treatment) HPI  Subjective :60 year old female who presents with a left nondominant hand cyst over the dorsum  present for a couple of weeks. She thinks that she may have injured her r hand with a rose bush and over that period time has grown into the large cyst overlies the fifth and fourth metacarpals distally. Sates that it has bled on several occasions whenever she hits it against something. He has other puncture wounds on her arms as she gardens with rose bushes.     Past Medical History:  Diagnosis Date  . Cataracts, bilateral   . Chickenpox cataracts  . COPD (chronic obstructive pulmonary disease) ([place])   . Depression   . Drug addiction ([place])   . Headache   . Hepatitis C virus infection   . Seizures ([place])    Past Surgical History:  Procedure Laterality Date  . APPENDECTOMY    . CHOLECYSTECTOMY    . COLONOSCOPY WITH PROPOFOL N/A 04/30/2015   Procedure: COLONOSCOPY WITH PROPOFOL;  Surgeon: Josefine Class, MD;  Location: [facility] ENDOSCOPY;  Service: Endoscopy;  Laterality: N/A;  . EYE SURGERY     Family History  Problem Relation Age of Onset  . Breast cancer Paternal Grandmother    Social History  Substance Use Topics  . Smoking status: Current Every Day Smoker    Packs/day: 1.00    Types: Cigarettes  . Smokeless tobacco: Never Used  . Alcohol use No   OB History    No data available     Review of Systems  Constitutional: Positive for activity change. Negative for chills, fatigue and fever.  Skin: Positive for color change and wound.  All other systems reviewed and are negative.   Allergies  Aspirin; Darvon [propoxyphene]; Iodine; Penicillins; Sulfa antibiotics; and Zithromax [azithromycin]  Home  Medications   Prior to Admission medications   Medication Sig Start Date End Date Taking? Authorizing Provider  acetaminophen (TYLENOL) 500 MG tablet Take 500 mg by mouth every 6 (six) hours as needed.    Historical Provider, MD  albuterol (PROVENTIL HFA;VENTOLIN HFA) 108 (90 BASE) MCG/ACT inhaler Inhale 2 puffs into the lungs every 6 (six) hours as needed for wheezing or shortness of breath.    Historical Provider, MD  gabapentin (NEURONTIN) 300 MG capsule Take 300 mg by mouth 3 (three) times daily.    Historical Provider, MD  levETIRAcetam (KEPPRA) 1000 MG tablet Take 1,000 mg by mouth 2 (two) times daily. Take one tab in the morning and two tabs at night    Historical Provider, MD  methadone (DOLOPHINE) 10 MG tablet Take 10 mg by mouth every 8 (eight) hours.    Historical Provider, MD  mirtazapine (REMERON) 30 MG tablet Take 30 mg by mouth at bedtime.    Historical Provider, MD  mupirocin ointment (BACTROBAN) 2 % Apply 1 application topically 3 (three) times daily. 04/08/16   Lorin Picket, PA-C   Meds Ordered and Administered this Visit  Medications - No data to display  BP 119/60 (BP Location: Left Arm)   Pulse 69   Temp 97.3 F (36.3 C) (Tympanic)   Resp 16   Ht 5\' 3"  (1.6 m)   Wt 115 lb (52.2 kg)   SpO2 100%   BMI 20.37 kg/m  No data found.   Physical Exam  Constitutional: She is oriented to person, place, and time. She appears well-developed and well-nourished. No distress.  Patient is wearing portable oxygen with a nasal cannula  HENT:  Head: Normocephalic and atraumatic.  Eyes: EOM are normal. Pupils are equal, round, and reactive to light.  Neck: Normal range of motion. Neck supple.  Musculoskeletal: Normal range of motion. She exhibits no edema, tenderness or deformity.  Neurological: She is alert and oriented to person, place, and time.  Skin: Skin is warm and dry. She is not diaphoretic.  Examination of the left nondominant hand shows over the dorsum overlying  the fourth and fifth distal metacarpals a round 10 mm red with eschar over the top lesion with a rough surface. There is no active bleeding at this time.  Psychiatric: She has a normal mood and affect. Her behavior is normal. Judgment and thought content normal.  Nursing note and vitals reviewed.   Urgent Care Course   Clinical Course    Procedures (including critical care time)  Labs Review Labs Reviewed - No data to display  Imaging Review No results found.   Visual Acuity Review  Right Eye Distance:   Left Eye Distance:   Bilateral Distance:    Right Eye Near:   Left Eye Near:    Bilateral Near:         MDM   1. Pyogenic granuloma   2. Multiple puncture wounds    Discharge Medication List as of 04/08/2016 12:17 PM    START taking these medications   Details  mupirocin ointment (BACTROBAN) 2 % Apply 1 application topically 3 (three) times daily., Starting Tue 04/08/2016, Normal      Plan: 1. Test/x-ray results and diagnosis reviewed with patient 2. rx as per orders; risks, benefits, potential side effects reviewed with patient 3. Recommend supportive treatment with Attempts to avoid any further trauma. I've given her Bactroban for the other wounds to prevent secondary infections. Have told her that we'll send her to a dermatologist for excision of the granuloma. Name and address were provided to the patient. She will make the appointment. 4. F/u prn if symptoms worsen or don't improve     Lorin Picket, PA-C 04/08/16 1229

## 2016-04-10 ENCOUNTER — Telehealth: Payer: Self-pay

## 2016-04-10 NOTE — Telephone Encounter (Signed)
Courtesy call back completed today after patient's visit at [facility]. Patient improved and will call back with any questions or concerns.  

## 2016-06-16 ENCOUNTER — Ambulatory Visit: Admission: EM | Admit: 2016-06-16 | Discharge: 2016-06-16 | Discharge status: Absent without leave | Payer: 59

## 2016-11-28 ENCOUNTER — Ambulatory Visit (INDEPENDENT_AMBULATORY_CARE_PROVIDER_SITE_OTHER): Payer: 59

## 2016-11-28 ENCOUNTER — Ambulatory Visit
Admission: EM | Admit: 2016-11-28 | Discharge: 2016-11-28 | Disposition: A | Discharge status: Home / Self Care | Payer: 59 | Attending: Internal Medicine | Admitting: Internal Medicine

## 2016-11-28 DIAGNOSIS — S2231XA Fracture of one rib, right side, initial encounter for closed fracture: Secondary | ICD-10-CM | POA: Diagnosis not present

## 2016-11-28 NOTE — ED Provider Notes (Signed)
CSN: [id number]     Arrival date & time 11/28/16  1540 History   First MD Initiated Contact with Patient 11/28/16 1622     Chief Complaint  Patient presents with  . Rib Injury   (Consider location/radiation/quality/duration/timing/severity/associated sxs/prior Treatment) HPI  This 61 year old female who presents with right-sided rib pain that she had when she rolled out of bed this morning and landed on an object that she doesn't know exactly what the object was. Abrasions on her right arm along with ecchymotic areas. She states that it hurts to take in a deep breath or cough. She is on supplemental oxygen for the advanced stage COPD.       Past Medical History:  Diagnosis Date  . Cataracts, bilateral   . Chickenpox cataracts  . COPD (chronic obstructive pulmonary disease) ([place])   . Depression   . Drug addiction ([place])   . Headache   . Hepatitis C virus infection   . Seizures ([place])    Past Surgical History:  Procedure Laterality Date  . APPENDECTOMY    . CHOLECYSTECTOMY    . COLONOSCOPY WITH PROPOFOL N/A 04/30/2015   Procedure: COLONOSCOPY WITH PROPOFOL;  Surgeon: Josefine Class, MD;  Location: [facility] ENDOSCOPY;  Service: Endoscopy;  Laterality: N/A;  . EYE SURGERY     Family History  Problem Relation Age of Onset  . Breast cancer Paternal Grandmother    Social History  Substance Use Topics  . Smoking status: Current Every Day Smoker    Packs/day: 1.00    Types: Cigarettes  . Smokeless tobacco: Never Used  . Alcohol use No   OB History    No data available     Review of Systems  Constitutional: Positive for activity change. Negative for chills, fatigue and fever.  Respiratory: Negative for shortness of breath and wheezing.   Cardiovascular: Positive for chest pain.  Musculoskeletal: Positive for myalgias.  All other systems reviewed and are negative.   Allergies  Aspirin; Darvon [propoxyphene]; Iodine; Penicillins; Sulfa antibiotics; and Zithromax  [azithromycin]  Home Medications   Prior to Admission medications   Medication Sig Start Date End Date Taking? Authorizing Provider  gabapentin (NEURONTIN) 300 MG capsule Take 300 mg by mouth 3 (three) times daily.   Yes [provider]  methadone (DOLOPHINE) 10 MG tablet Take 10 mg by mouth every 8 (eight) hours.   Yes [provider]  mirtazapine (REMERON) 30 MG tablet Take 30 mg by mouth at bedtime.   Yes [provider]  acetaminophen (TYLENOL) 500 MG tablet Take 500 mg by mouth every 6 (six) hours as needed.    [provider]  albuterol (PROVENTIL HFA;VENTOLIN HFA) 108 (90 BASE) MCG/ACT inhaler Inhale 2 puffs into the lungs every 6 (six) hours as needed for wheezing or shortness of breath.    [provider]  mupirocin ointment (BACTROBAN) 2 % Apply 1 application topically 3 (three) times daily. 04/08/16   Lorin Picket, PA-C   Meds Ordered and Administered this Visit  Medications - No data to display  BP 138/76 (BP Location: Left Arm)   Pulse 80   Temp 98 F (36.7 C) (Oral)   Resp 18   Ht 5\' 3"  (1.6 m)   Wt 118 lb (53.5 kg)   SpO2 96%   BMI 20.90 kg/m  No data found.   Physical Exam  Constitutional: She is oriented to person, place, and time. She appears well-developed and well-nourished. No distress.  HENT:  Head: Normocephalic.  Eyes: Pupils are equal, round, and reactive to light. Right eye exhibits no discharge. Left eye exhibits no discharge.  Neck: Normal range of motion.  Pulmonary/Chest: Effort normal. No respiratory distress. She has no wheezes. She has rales.  Musculoskeletal: Normal range of motion. She exhibits tenderness.  She has no obvious ecchymosis or erythema or skin disruption of the chest wall. Tenderness is over the anterior chest wall at approximately the 8th rib in the midclavicular line. Is on supplemental oxygen with utilizing a portable concentrator.  Lymphadenopathy:    She has no cervical  adenopathy.  Neurological: She is alert and oriented to person, place, and time.  Skin: Skin is warm and dry. She is not diaphoretic.  Psychiatric: She has a normal mood and affect. Her behavior is normal. Judgment and thought content normal.  Nursing note and vitals reviewed.   Urgent Care Course     Procedures (including critical care time)  Labs Review Labs Reviewed - No data to display  Imaging Review Dg Ribs Unilateral W/chest Right  Result Date: 11/28/2016 CLINICAL DATA:  Right-sided rib pain after falling out of bed this morning. EXAM: RIGHT RIBS AND CHEST - 3+ VIEW COMPARISON:  Chest CT dated 02/26/2016 and chest x-ray dated 05/01/2012 FINDINGS: There is a displaced fracture of the anterolateral aspect of the right eighth rib. There is no underlying pneumothorax or pleural effusion. Emphysema. Pulmonary arterial hypertension as indicated by enlargement of the main pulmonary artery. No acute infiltrates. Heart size is normal. Osteopenia. IMPRESSION: Acute displaced fracture of the right eighth rib anterior laterally. Electronically Signed   By: Lorriane Shire M.D.   On: 11/28/2016 16:55     Visual Acuity Review  Right Eye Distance:   Left Eye Distance:   Bilateral Distance:    Right Eye Near:   Left Eye Near:    Bilateral Near:         MDM   1. Closed fracture of one rib of right side, initial encounter    Discharge Medication List as of 11/28/2016  5:19 PM    Plan: 1. Test/x-ray results and diagnosis reviewed with patient 2. rx as per orders; risks, benefits, potential side effects reviewed with patient 3. Recommend supportive treatment with Frequent cough and deep breathing. Support the area with your hand when performing deep inspirations or coughing. Lying on the right side the allow you to sleep more comfortably. He may also try to sleep in a recliner initially to see if that would give you better comfort. He developed sudden shortness of breath or any coughing  any blood go to the emergency room immediately. Otherwise follow-up with your primary care physician in a week or 2.  4. F/u prn if symptoms worsen or don't improve     Lorin Picket, PA-C 11/28/16 1730

## 2016-11-28 NOTE — ED Triage Notes (Signed)
Pt c/o right rib pain, she rolled out of bed this morning. She landed on the right side

## 2017-02-17 ENCOUNTER — Other Ambulatory Visit: Payer: Self-pay | Admitting: Family Medicine

## 2017-02-17 DIAGNOSIS — Z1231 Encounter for screening mammogram for malignant neoplasm of breast: Secondary | ICD-10-CM

## 2017-03-06 ENCOUNTER — Other Ambulatory Visit: Payer: Self-pay | Admitting: Family Medicine

## 2017-03-06 ENCOUNTER — Ambulatory Visit
Admission: RE | Admit: 2017-03-06 | Discharge: 2017-03-06 | Disposition: A | Discharge status: Home / Self Care | Payer: 59 | Source: Ambulatory Visit | Attending: Family Medicine | Admitting: Family Medicine

## 2017-03-06 DIAGNOSIS — M7989 Other specified soft tissue disorders: Secondary | ICD-10-CM | POA: Diagnosis present

## 2017-03-06 DIAGNOSIS — L03115 Cellulitis of right lower limb: Secondary | ICD-10-CM | POA: Insufficient documentation

## 2017-03-30 ENCOUNTER — Ambulatory Visit
Admission: RE | Admit: 2017-03-30 | Discharge: 2017-03-30 | Disposition: A | Discharge status: Home / Self Care | Payer: 59 | Source: Ambulatory Visit | Attending: Family Medicine | Admitting: Family Medicine

## 2017-03-30 ENCOUNTER — Encounter: Payer: Self-pay | Admitting: Podiatry

## 2017-03-30 ENCOUNTER — Ambulatory Visit (INDEPENDENT_AMBULATORY_CARE_PROVIDER_SITE_OTHER): Payer: 59 | Admitting: Podiatry

## 2017-03-30 ENCOUNTER — Ambulatory Visit (INDEPENDENT_AMBULATORY_CARE_PROVIDER_SITE_OTHER): Payer: 59

## 2017-03-30 VITALS — BP 153/87 | HR 73 | Resp 18

## 2017-03-30 DIAGNOSIS — R569 Unspecified convulsions: Secondary | ICD-10-CM | POA: Insufficient documentation

## 2017-03-30 DIAGNOSIS — F1920 Other psychoactive substance dependence, uncomplicated: Secondary | ICD-10-CM | POA: Insufficient documentation

## 2017-03-30 DIAGNOSIS — M2011 Hallux valgus (acquired), right foot: Secondary | ICD-10-CM | POA: Diagnosis not present

## 2017-03-30 DIAGNOSIS — F32A Depression, unspecified: Secondary | ICD-10-CM | POA: Insufficient documentation

## 2017-03-30 DIAGNOSIS — M2041 Other hammer toe(s) (acquired), right foot: Secondary | ICD-10-CM | POA: Diagnosis not present

## 2017-03-30 DIAGNOSIS — Z1231 Encounter for screening mammogram for malignant neoplasm of breast: Secondary | ICD-10-CM | POA: Diagnosis present

## 2017-03-30 DIAGNOSIS — J449 Chronic obstructive pulmonary disease, unspecified: Secondary | ICD-10-CM | POA: Insufficient documentation

## 2017-03-30 DIAGNOSIS — H269 Unspecified cataract: Secondary | ICD-10-CM | POA: Insufficient documentation

## 2017-03-30 DIAGNOSIS — F329 Major depressive disorder, single episode, unspecified: Secondary | ICD-10-CM | POA: Insufficient documentation

## 2017-03-30 NOTE — Progress Notes (Signed)
Subjective:  Patient ID: Natalie [name], female    DOB: Aug 06, 1955,  MRN: [id number] HPI Chief Complaint  Patient presents with  . Foot Pain    1st MPJ and 2nd toe right - aching x 1 year, notices area gets red and swollen a lot more now, shoes uncomfortable    61 y.o. female presents with the above complaint. She presents today with a chief complaint of a painful first metatarsophalangeal joint of her right foot. States it is been bothering her now for about a year noticed that the shoes are uncomfortable as the lesion appears to grow states that the joint has become red and painful. Denies history of gout.  States that she has never had blood work done to confirm gout. She has a history of COPD continues to smoke . Nasal cannula 2 L O2. Would like to have this surgically corrected. No history of rheumatoid arthritis  Past Medical History:  Diagnosis Date  . Cataracts, bilateral   . Chickenpox cataracts  . COPD (chronic obstructive pulmonary disease) ([place])   . Depression   . Drug addiction ([place])   . Headache   . Hepatitis C virus infection   . Seizures ([place])    Past Surgical History:  Procedure Laterality Date  . APPENDECTOMY    . CHOLECYSTECTOMY    . COLONOSCOPY WITH PROPOFOL N/A 04/30/2015   Procedure: COLONOSCOPY WITH PROPOFOL;  Surgeon: Josefine Class, MD;  Location: [facility] ENDOSCOPY;  Service: Endoscopy;  Laterality: N/A;  . EYE SURGERY      Current Outpatient Prescriptions:  .  tiotropium (SPIRIVA) 18 MCG inhalation capsule, Place into inhaler and inhale., Disp: , Rfl:  .  acetaminophen (TYLENOL) 500 MG tablet, Take 500 mg by mouth every 6 (six) hours as needed., Disp: , Rfl:  .  albuterol (PROVENTIL HFA;VENTOLIN HFA) 108 (90 BASE) MCG/ACT inhaler, Inhale 2 puffs into the lungs every 6 (six) hours as needed for wheezing or shortness of breath., Disp: , Rfl:  .  gabapentin (NEURONTIN) 300 MG capsule, Take 300 mg by mouth 3 (three) times daily., Disp: , Rfl:  .  methadone  (DOLOPHINE) 10 MG tablet, Take 10 mg by mouth every 8 (eight) hours., Disp: , Rfl:  .  mirtazapine (REMERON) 30 MG tablet, Take 30 mg by mouth at bedtime., Disp: , Rfl:  .  mupirocin ointment (BACTROBAN) 2 %, Apply 1 application topically 3 (three) times daily., Disp: 22 g, Rfl: 0  Allergies  Allergen Reactions  . Aspirin   . Darvon [Propoxyphene]   . Iodine   . Penicillins   . Sulfa Antibiotics   . Zithromax [Azithromycin]    Review of Systems  Respiratory: Positive for apnea, chest tightness, shortness of breath and wheezing.   Musculoskeletal: Positive for arthralgias.  All other systems reviewed and are negative.  Objective:   Vitals:   03/30/17 1454  BP: (!) 153/87  Pulse: 73  Resp: 18    General: Appears thin and gount, nourished, in no acute distress, alert and oriented x3   Dermatological: Skin is warm, dry and supple bilateral. Nails x 10 are well maintained; remaining integument appears unremarkable at this time. There are no open sores, no preulcerative lesions, no rash or signs of infection present. She has hard indurated skin to the anterior shin right. States that she has had history of cellulitis and lesion to the right leg. Lower(the  Vascular: Dorsalis Pedis artery and Posterior Tibial artery pedal pulses are 2/4 bilateral with immedate capillary fill  time. Pedal hair growth present. No varicosities and no lower extremity edema present bilateral.   Neruologic: Grossly intact via light touch bilateral. Vibratory intact via tuning fork bilateral. Protective threshold with Semmes Wienstein monofilament intact to all pedal sites bilateral. Patellar and Achilles deep tendon reflexes 2+ bilateral. No Babinski or clonus noted bilateral.   Musculoskeletal: Demonstrates mild pes planus bowel deviation of all the toes on the right foot with hallux abductovalgus deformity. Large nonmobile pulsatile nodule to the medial aspect of the first metatarsophalangeal joint consistent  with bunion deformity though it is moderately erythematous is minimally painful on palpation as if it were gout. She does have pain on range of motion of the toe.  Gait: Unassisted, Nonantalgic.    Radiographs:  3 views bilateral foot taken today demonstrates mild osteopenia and osseously mature individual with hallux abductovalgus deformity dislocation of the first metatarsophalangeal joint arthritic changes as well as lateral deviation of the lesser toes.  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 Hammertoe deformities lesser toes.  Plan: At this point I injected Kenalog around the metatarsophalangeal joint to help alleviate his symptoms that she is currently feeling. I also referred her to Dr. Amalia Hailey for possible surgical consult and evaluation.I also placed her in a Darco shoe.     Max T. Osborne, [place]

## 2017-04-07 ENCOUNTER — Ambulatory Visit (INDEPENDENT_AMBULATORY_CARE_PROVIDER_SITE_OTHER): Payer: 59 | Admitting: Podiatry

## 2017-04-07 DIAGNOSIS — M7751 Other enthesopathy of right foot: Secondary | ICD-10-CM

## 2017-04-07 DIAGNOSIS — M21611 Bunion of right foot: Secondary | ICD-10-CM

## 2017-04-08 NOTE — Progress Notes (Signed)
   Subjective: 61 year old female patient of Dr. Milinda Pointer presenting today to discuss surgical treatment of a bunion to the right foot. She also reports hammertoes 2-5 of the right foot. She has been wearing a postop shoe which she states helps alleviate the pain. There are no modifying factors noted. She is here for further evaluation and treatment.   Past Medical History:  Diagnosis Date  . Cataracts, bilateral   . Chickenpox cataracts  . COPD (chronic obstructive pulmonary disease) ([place])   . Depression   . Drug addiction ([place])   . Headache   . Hepatitis C virus infection   . Seizures ([place])      Objective: Physical Exam General: The patient is alert and oriented x3 in no acute distress.  Dermatology: Skin is cool, dry and supple bilateral lower extremities. Negative for open lesions or macerations.  Vascular: Palpable pedal pulses bilaterally. No edema or erythema noted. Capillary refill within normal limits.  Neurological: Epicritic and protective threshold grossly intact bilaterally.   Musculoskeletal Exam: Clinical evidence of bunion deformity noted to the respective foot. There is a moderate pain on palpation range of motion of the first MPJ. Lateral deviation of the hallux noted consistent with hallux abductovalgus. Pain with palpation to the first MPJ of the right foot.   Assessment: 1. 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 First MPJ capsulitis right   Plan of Care:  1. Patient was evaluated. X-Rays taken at previous visit. 2. Injection of 0.5 mLs of Celestone Soluspan injected into the right first MPJ.  3. Offloading met pads dispensed today. 4. Recommended conservative treatment at the moment. 5. Appointment with Liliane Channel for custom molded orthotics. 6. Patient is not a surgical candidate due to complicated past medical history.   7. Return to clinic when necessary.   Edrick Kins, DPM [facility]  Dr. Edrick Kins, [address]                                        [place], [place] [address]                Office [phone]  Fax [phone]

## 2017-04-15 MED ORDER — BETAMETHASONE SOD PHOS & ACET 6 (3-3) MG/ML IJ SUSP: 3.0000 mg | Freq: Once | INTRAMUSCULAR | Status: DC

## 2017-04-29 ENCOUNTER — Ambulatory Visit (INDEPENDENT_AMBULATORY_CARE_PROVIDER_SITE_OTHER): Payer: Self-pay | Admitting: Orthotics

## 2017-04-29 DIAGNOSIS — M21611 Bunion of right foot: Secondary | ICD-10-CM

## 2017-04-29 DIAGNOSIS — M2041 Other hammer toe(s) (acquired), right foot: Secondary | ICD-10-CM

## 2017-04-29 NOTE — Progress Notes (Signed)
Patient presents with HAV, sensitivity esp right. Per recommendation Dr. Amalia Hailey.   Plan on supersoft CMFO accomodative w/ k-wedge R to address sensitivity under 1st MPJ.  $300 self pay.

## 2017-05-27 ENCOUNTER — Other Ambulatory Visit: Payer: 59 | Admitting: Orthotics

## 2017-06-10 DIAGNOSIS — M722 Plantar fascial fibromatosis: Secondary | ICD-10-CM

## 2017-07-08 ENCOUNTER — Ambulatory Visit (INDEPENDENT_AMBULATORY_CARE_PROVIDER_SITE_OTHER): Payer: 59 | Admitting: Orthotics

## 2017-07-08 DIAGNOSIS — M2041 Other hammer toe(s) (acquired), right foot: Secondary | ICD-10-CM

## 2017-07-08 DIAGNOSIS — M2011 Hallux valgus (acquired), right foot: Secondary | ICD-10-CM

## 2017-07-08 NOTE — Progress Notes (Signed)
Patient came in today to pick up custom made foot orthotics.  The goals were accomplished and the patient reported no dissatisfaction with said orthotics.  Patient was advised of breakin period and how to report any issues.  Patient paying $150 then 50 month next 3 month.

## 2017-07-10 ENCOUNTER — Encounter: Payer: Self-pay | Admitting: *Deleted

## 2017-07-13 ENCOUNTER — Ambulatory Visit: Payer: 59 | Admitting: Anesthesiology

## 2017-07-13 ENCOUNTER — Ambulatory Visit
Admission: RE | Admit: 2017-07-13 | Discharge: 2017-07-13 | Disposition: A | Discharge status: Home / Self Care | Payer: 59 | Source: Ambulatory Visit | Attending: Unknown Physician Specialty | Admitting: Unknown Physician Specialty

## 2017-07-13 ENCOUNTER — Encounter
Admission: RE | Disposition: A | Discharge status: Home / Self Care | Payer: Self-pay | Source: Ambulatory Visit | Attending: Unknown Physician Specialty

## 2017-07-13 ENCOUNTER — Encounter: Payer: Self-pay | Admitting: Anesthesiology

## 2017-07-13 DIAGNOSIS — K222 Esophageal obstruction: Secondary | ICD-10-CM | POA: Diagnosis not present

## 2017-07-13 DIAGNOSIS — B1920 Unspecified viral hepatitis C without hepatic coma: Secondary | ICD-10-CM | POA: Diagnosis not present

## 2017-07-13 DIAGNOSIS — K621 Rectal polyp: Secondary | ICD-10-CM | POA: Insufficient documentation

## 2017-07-13 DIAGNOSIS — D123 Benign neoplasm of transverse colon: Secondary | ICD-10-CM | POA: Diagnosis not present

## 2017-07-13 DIAGNOSIS — Z8371 Family history of colonic polyps: Secondary | ICD-10-CM | POA: Insufficient documentation

## 2017-07-13 DIAGNOSIS — J449 Chronic obstructive pulmonary disease, unspecified: Secondary | ICD-10-CM | POA: Insufficient documentation

## 2017-07-13 DIAGNOSIS — F329 Major depressive disorder, single episode, unspecified: Secondary | ICD-10-CM | POA: Insufficient documentation

## 2017-07-13 DIAGNOSIS — Z79899 Other long term (current) drug therapy: Secondary | ICD-10-CM | POA: Insufficient documentation

## 2017-07-13 DIAGNOSIS — K2950 Unspecified chronic gastritis without bleeding: Secondary | ICD-10-CM | POA: Diagnosis not present

## 2017-07-13 DIAGNOSIS — K317 Polyp of stomach and duodenum: Secondary | ICD-10-CM | POA: Diagnosis not present

## 2017-07-13 DIAGNOSIS — Z86010 Personal history of colonic polyps: Secondary | ICD-10-CM | POA: Diagnosis not present

## 2017-07-13 DIAGNOSIS — Z1211 Encounter for screening for malignant neoplasm of colon: Secondary | ICD-10-CM | POA: Diagnosis present

## 2017-07-13 DIAGNOSIS — Z9981 Dependence on supplemental oxygen: Secondary | ICD-10-CM | POA: Insufficient documentation

## 2017-07-13 DIAGNOSIS — F17210 Nicotine dependence, cigarettes, uncomplicated: Secondary | ICD-10-CM | POA: Insufficient documentation

## 2017-07-13 HISTORY — PX: ESOPHAGOGASTRODUODENOSCOPY (EGD) WITH PROPOFOL: SHX5813

## 2017-07-13 HISTORY — DX: Personal history of colonic polyps: Z86.010

## 2017-07-13 HISTORY — DX: Personal history of other infectious and parasitic diseases: Z86.19

## 2017-07-13 HISTORY — PX: COLONOSCOPY WITH PROPOFOL: SHX5780

## 2017-07-13 HISTORY — DX: Personal history of colon polyps, unspecified: Z86.0100

## 2017-07-13 HISTORY — DX: Unspecified viral hepatitis C without hepatic coma: B19.20

## 2017-07-13 SURGERY — ESOPHAGOGASTRODUODENOSCOPY (EGD) WITH PROPOFOL
Anesthesia: General

## 2017-07-13 MED ORDER — LIDOCAINE HCL (PF) 2 % IJ SOLN
INTRAMUSCULAR | Status: DC | PRN
  Administered 2017-07-13: 60 mg

## 2017-07-13 MED ORDER — SODIUM CHLORIDE 0.9 % IV SOLN: INTRAVENOUS | Status: DC

## 2017-07-13 MED ORDER — PROPOFOL 500 MG/50ML IV EMUL
INTRAVENOUS | Status: DC | PRN
  Administered 2017-07-13: 50 ug/kg/min via INTRAVENOUS

## 2017-07-13 MED ORDER — MIDAZOLAM HCL 2 MG/2ML IJ SOLN
INTRAMUSCULAR | Status: AC
  Filled 2017-07-13: qty 2

## 2017-07-13 MED ORDER — LIDOCAINE HCL (PF) 1 % IJ SOLN
INTRAMUSCULAR | Status: AC
  Filled 2017-07-13: qty 2

## 2017-07-13 MED ORDER — PROPOFOL 10 MG/ML IV BOLUS
INTRAVENOUS | Status: DC | PRN
  Administered 2017-07-13: 20 mg via INTRAVENOUS
  Administered 2017-07-13: 30 mg via INTRAVENOUS
  Administered 2017-07-13: 10 mg via INTRAVENOUS

## 2017-07-13 MED ORDER — SODIUM CHLORIDE 0.9 % IV SOLN
INTRAVENOUS | Status: DC
  Administered 2017-07-13: 13:00:00 via INTRAVENOUS

## 2017-07-13 MED ORDER — LIDOCAINE HCL (PF) 1 % IJ SOLN: 2.0000 mL | Freq: Once | INTRAMUSCULAR | Status: DC

## 2017-07-13 MED ORDER — FENTANYL CITRATE (PF) 100 MCG/2ML IJ SOLN
INTRAMUSCULAR | Status: DC | PRN
  Administered 2017-07-13 (×2): 50 ug via INTRAVENOUS

## 2017-07-13 MED ORDER — MIDAZOLAM HCL 5 MG/5ML IJ SOLN
INTRAMUSCULAR | Status: DC | PRN
  Administered 2017-07-13: 2 mg via INTRAVENOUS

## 2017-07-13 MED ORDER — PROPOFOL 500 MG/50ML IV EMUL
INTRAVENOUS | Status: AC
  Filled 2017-07-13: qty 50

## 2017-07-13 MED ORDER — FENTANYL CITRATE (PF) 100 MCG/2ML IJ SOLN
INTRAMUSCULAR | Status: AC
  Filled 2017-07-13: qty 2

## 2017-07-13 NOTE — Anesthesia Postprocedure Evaluation (Signed)
Anesthesia Post Note  Patient: KEDRA MCGLADE  Procedure(s) Performed: ESOPHAGOGASTRODUODENOSCOPY (EGD) WITH PROPOFOL (N/A ) COLONOSCOPY WITH PROPOFOL (N/A )  Patient location during evaluation: Endoscopy Anesthesia Type: General Level of consciousness: awake and alert Pain management: pain level controlled Vital Signs Assessment: post-procedure vital signs reviewed and stable Respiratory status: spontaneous breathing, nonlabored ventilation, respiratory function stable and patient connected to nasal cannula oxygen Cardiovascular status: blood pressure returned to baseline and stable Postop Assessment: no apparent nausea or vomiting Anesthetic complications: no     Last Vitals:  Vitals:   07/13/17 1216 07/13/17 1400  BP: 124/64 116/66  Pulse: 78   Resp: 17 16  Temp: (!) 35.6 C 36.6 C  SpO2: 90%     Last Pain:  Vitals:   07/13/17 1400  TempSrc: Tympanic                 Martha Clan

## 2017-07-13 NOTE — Anesthesia Post-op Follow-up Note (Signed)
Anesthesia QCDR form completed.        

## 2017-07-13 NOTE — Op Note (Addendum)
[facility]enterology Patient Name: Natalie [name] Procedure Date: 07/13/2017 12:59 PM MRN: [id number] Account #: [id number] Date of Birth: 09/25/1955 Admit Type: Outpatient Age: 62 Room: [facility] ENDO ROOM 3 Gender: Female Note Status: Finalized Procedure:            Upper GI endoscopy Indications:          Dysphagia Providers:            Manya Silvas, MD Referring MD:         Dani Gobble. White, MD (Referring MD) Medicines:            Propofol per Anesthesia Complications:        No immediate complications. Procedure:            Pre-Anesthesia Assessment:                       - After reviewing the risks and benefits, the patient                        was deemed in satisfactory condition to undergo the                        procedure.                       After obtaining informed consent, the endoscope was                        passed under direct vision. Throughout the procedure,                        the patient's blood pressure, pulse, and oxygen                        saturations were monitored continuously. The Endoscope                        was introduced through the mouth, and advanced to the                        second part of duodenum. The upper GI endoscopy was                        accomplished without difficulty. The patient tolerated                        the procedure well. Findings:      Food was found in the upper third of the esophagus and in the middle       third of the esophagus. Removal of food was accomplished.      Diffuse mildly erythematous mucosa without bleeding was found in the       gastric body and in the gastric antrum. Biopsies were taken with a cold       forceps for histology. Biopsies were taken with a cold forceps for       Helicobacter pylori testing.      A single 12 mm sessile polyp with no bleeding was found in the second       portion of the duodenum. Biopsies were taken with a cold forceps for   histology.      A  guide wire was passed into the stomach and a 71F Savary dilator was       passed without difficulty. Impression:           - Food in the upper third of the esophagus and in the                        middle third of the esophagus. Removal was successful.                       - Erythematous mucosa in the gastric body and antrum.                        Biopsied.                       - A single duodenal polyp. Biopsied. Recommendation:       - Await pathology results. Eat slowly chew well, take                        small bites. Manya Silvas, MD 07/13/2017 1:17:28 PM This report has been signed electronically. Number of Addenda: 0 Note Initiated On: 07/13/2017 12:59 PM      [facility]

## 2017-07-13 NOTE — Anesthesia Preprocedure Evaluation (Signed)
Anesthesia Evaluation  Patient identified by MRN, date of birth, ID band Patient awake    Reviewed: Allergy & Precautions, H&P , NPO status , Patient's Chart, lab work & pertinent test results, reviewed documented beta blocker date and time   History of Anesthesia Complications Negative for: history of anesthetic complications  Airway Mallampati: III  TM Distance: >3 FB Neck ROM: full    Dental no notable dental hx. 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)  , Hepatitis -, C  Endo/Other  negative endocrine ROS  Renal/GU Renal disease  negative genitourinary   Musculoskeletal   Abdominal   Peds  Hematology negative hematology ROS (+)   Anesthesia Other Findings Past Medical History:   COPD (chronic obstructive pulmonary disease) (*              Depression                                                   Seizures (HCC)                                               Headache                                                     Hepatitis C virus infection                                  Chickenpox                                      cataracts    Cataracts, bilateral                                         Drug addiction ([place])                                         Reproductive/Obstetrics negative OB ROS                             Anesthesia Physical  Anesthesia Plan  ASA: IV  Anesthesia Plan: General   Post-op Pain Management:    Induction: Intravenous  PONV Risk Score and Plan: 2 and Propofol  infusion  Airway Management Planned:  Nasal Cannula  Additional Equipment:   Intra-op Plan:   Post-operative Plan:   Informed Consent: I have reviewed the patients History and Physical, chart, labs and discussed the procedure including the risks, benefits and alternatives for the proposed anesthesia with the patient or authorized representative who has indicated his/her understanding and acceptance.   Dental Advisory Given  Plan Discussed with: Anesthesiologist, CRNA and Surgeon  Anesthesia Plan Comments:         Anesthesia Quick Evaluation

## 2017-07-13 NOTE — H&P (Signed)
Primary Care Physician:  Ricardo Jericho, NP Primary Gastroenterologist:  Dr. Vira Agar  Pre-Procedure History & Physical: HPI:  Natalie [name] is a 62 y.o. female is here for an endoscopy and colonoscopy.   Past Medical History:  Diagnosis Date  . Cataracts, bilateral   . Chickenpox cataracts  . COPD (chronic obstructive pulmonary disease) ([place])   . Depression   . Drug addiction ([place])   . Headache   . Hepatitis C virus infection   . Hepatitis C without hepatic coma   . History of chicken pox   . History of colon polyps   . Seizures ([place])     Past Surgical History:  Procedure Laterality Date  . APPENDECTOMY    . CHOLECYSTECTOMY    . COLONOSCOPY WITH PROPOFOL N/A 04/30/2015   Procedure: COLONOSCOPY WITH PROPOFOL;  Surgeon: Josefine Class, MD;  Location: [facility] ENDOSCOPY;  Service: Endoscopy;  Laterality: N/A;  . EYE SURGERY      Prior to Admission medications   Medication Sig Start Date End Date Taking? Authorizing Provider  ondansetron (ZOFRAN-ODT) 4 MG disintegrating tablet Take 4 mg by mouth every 8 (eight) hours as needed for nausea or vomiting.   Yes [provider]  polyethylene glycol powder (GLYCOLAX/MIRALAX) powder Take 1 Container by mouth once.   Yes [provider]  acetaminophen (TYLENOL) 500 MG tablet Take 500 mg by mouth every 6 (six) hours as needed.    [provider]  albuterol (PROVENTIL HFA;VENTOLIN HFA) 108 (90 BASE) MCG/ACT inhaler Inhale 2 puffs into the lungs every 6 (six) hours as needed for wheezing or shortness of breath.    [provider]  gabapentin (NEURONTIN) 300 MG capsule Take 300 mg by mouth 3 (three) times daily.    [provider]  methadone (DOLOPHINE) 10 MG tablet Take 10 mg by mouth every 8 (eight) hours.    [provider]  mirtazapine (REMERON) 30 MG tablet Take 30 mg by mouth at bedtime.    [provider]  mupirocin ointment (BACTROBAN) 2 % Apply 1 application  topically 3 (three) times daily. 04/08/16   Lorin Picket, PA-C  tiotropium (SPIRIVA) 18 MCG inhalation capsule Place into inhaler and inhale. 03/25/17 03/25/18  [provider]    Allergies as of 05/25/2017 - Review Complete 04/07/2017  Allergen Reaction Noted  . Aspirin  04/27/2015  . Darvon [propoxyphene]  04/30/2015  . Iodine  04/27/2015  . Penicillins  04/27/2015  . Sulfa antibiotics  04/27/2015  . Zithromax [azithromycin]  04/27/2015    Family History  Problem Relation Age of Onset  . Breast cancer Paternal Grandmother     Social History   Socioeconomic History  . Marital status: Married    Spouse name: Not on file  . Number of children: Not on file  . Years of education: Not on file  . Highest education level: Not on file  Social Needs  . Financial resource strain: Not on file  . Food insecurity - worry: Not on file  . Food insecurity - inability: Not on file  . Transportation needs - medical: Not on file  . Transportation needs - non-medical: Not on file  Occupational History  . Not on file  Tobacco Use  . Smoking status: Current Every Day Smoker    Packs/day: 1.00    Years: 41.00    Pack years: 41.00    Types: Cigarettes  . Smokeless tobacco: Never Used  Substance and Sexual Activity  . Alcohol  use: No  . Drug use: No  . Sexual activity: Not on file  Other Topics Concern  . Not on file  Social History Narrative  . Not on file    Review of Systems: See HPI, otherwise negative ROS  Physical Exam: BP 124/64   Pulse 78   Temp (!) 96.1 F (35.6 C) (Tympanic)   Resp 17   Ht 5\' 3"  (1.6 m)   Wt 49 kg (108 lb)   SpO2 90%   BMI 19.13 kg/m  General:   Alert,  pleasant and cooperative in NAD Head:  Normocephalic and atraumatic. Neck:  Supple; no masses or thyromegaly. Lungs:  Clear throughout to auscultation.   Decreased breath sounds all lung fields. Heart:  Regular rate and rhythm. Abdomen:  Soft, nontender and nondistended. Normal bowel  sounds, without guarding, and without rebound.   Neurologic:  Alert and  oriented x4;  grossly normal neurologically.  Impression/Plan: Natalie [name] is here for an endoscopy and colonoscopy to be performed for dysphagia and multiple polyps follow up.  Risks, benefits, limitations, and alternatives regarding  endoscopy and colonoscopy have been reviewed with the patient.  Questions have been answered.  All parties agreeable.   Gaylyn Cheers, MD  07/13/2017, 12:59 PM

## 2017-07-13 NOTE — Transfer of Care (Signed)
Immediate Anesthesia Transfer of Care Note  [name]  Procedure(s) Performed: ESOPHAGOGASTRODUODENOSCOPY (EGD) WITH PROPOFOL (N/A ) COLONOSCOPY WITH PROPOFOL (N/A )  Patient Location: PACU  Anesthesia Type:General  Level of Consciousness: sedated  Airway & Oxygen Therapy: Patient Spontanous Breathing and Patient connected to nasal cannula oxygen  Post-op Assessment: Report given to RN and Post -op Vital signs reviewed and stable  Post vital signs: Reviewed and stable  Last Vitals:  Vitals:   07/13/17 1216  BP: 124/64  Pulse: 78  Resp: 17  Temp: (!) 35.6 C  SpO2: 90%    Last Pain:  Vitals:   07/13/17 1216  TempSrc: Tympanic         Complications: No apparent anesthesia complications

## 2017-07-13 NOTE — Op Note (Addendum)
[facility] Gastroenterology Patient Name: Natalie [name] Procedure Date: 07/13/2017 12:58 PM MRN: [id number] Account #: [id number] Date of Birth: December 28, 1955 Admit Type: Outpatient Age: 62 Room: [facility] ENDO ROOM 3 Gender: Female Note Status: Finalized Procedure:            Colonoscopy Indications:          High risk colon cancer surveillance: Personal history                        of colonic polyps Providers:            Manya Silvas, MD Referring MD:         Dani Gobble. White, MD (Referring MD) Medicines:            Propofol per Anesthesia Complications:        No immediate complications. Procedure:            Pre-Anesthesia Assessment:                       - After reviewing the risks and benefits, the patient                        was deemed in satisfactory condition to undergo the                        procedure.                       After obtaining informed consent, the colonoscope was                        passed under direct vision. Throughout the procedure,                        the patient's blood pressure, pulse, and oxygen                        saturations were monitored continuously. The                        Colonoscope was introduced through the anus and                        advanced to the the cecum, identified by appendiceal                        orifice and ileocecal valve. The colonoscopy was                        performed without difficulty. The patient tolerated the                        procedure well. The quality of the bowel preparation                        was adequate to identify polyps. Findings:      An area of superficial nodular mucosa seen in cecum and biopsy done..      A small polyp was found in the transverse colon. The polyp was sessile.       The polyp was removed with a hot snare. Resection  and retrieval were       complete.      A diminutive polyp was found in the rectum. The polyp was sessile. The       polyp  was removed with a jumbo cold forceps. Resection and retrieval       were complete.      Internal hemorrhoids were found during endoscopy. The hemorrhoids were       small and Grade I (internal hemorrhoids that do not prolapse).      The exam was otherwise without abnormality. Impression:           - One diminutive polyp in the cecum, removed with a                        jumbo cold forceps. Resected and retrieved.                       - One small polyp in the transverse colon, removed with                        a hot snare. Resected and retrieved.                       - One diminutive polyp in the rectum, removed with a                        jumbo cold forceps. Resected and retrieved. Recommendation:       - Await pathology results. Manya Silvas, MD 07/13/2017 1:49:30 PM This report has been signed electronically. Number of Addenda: 0 Note Initiated On: 07/13/2017 12:58 PM Scope Withdrawal Time: 0 hours 12 minutes 18 seconds  Total Procedure Duration: 0 hours 25 minutes 46 seconds       [facility]

## 2017-07-13 NOTE — OR Nursing (Signed)
Dr. Vira Agar after finished with Colonoscopy, went back down on upper to dilate Esophagus.

## 2017-07-14 ENCOUNTER — Encounter: Payer: Self-pay | Admitting: Unknown Physician Specialty

## 2017-07-14 LAB — SURGICAL PATHOLOGY

## 2017-07-24 ENCOUNTER — Other Ambulatory Visit: Payer: Self-pay

## 2017-07-24 ENCOUNTER — Encounter: Payer: Self-pay | Admitting: Emergency Medicine

## 2017-07-24 ENCOUNTER — Inpatient Hospital Stay
Admission: EM | Admit: 2017-07-24 | Discharge: 2017-07-25 | DRG: 190 | Disposition: A | Discharge status: Home / Self Care | Payer: 59 | Attending: Internal Medicine | Admitting: Internal Medicine

## 2017-07-24 ENCOUNTER — Emergency Department: Payer: 59

## 2017-07-24 DIAGNOSIS — N183 Chronic kidney disease, stage 3 (moderate): Secondary | ICD-10-CM | POA: Diagnosis present

## 2017-07-24 DIAGNOSIS — Z880 Allergy status to penicillin: Secondary | ICD-10-CM | POA: Diagnosis not present

## 2017-07-24 DIAGNOSIS — I248 Other forms of acute ischemic heart disease: Secondary | ICD-10-CM | POA: Diagnosis present

## 2017-07-24 DIAGNOSIS — J9621 Acute and chronic respiratory failure with hypoxia: Secondary | ICD-10-CM | POA: Diagnosis present

## 2017-07-24 DIAGNOSIS — Z886 Allergy status to analgesic agent status: Secondary | ICD-10-CM

## 2017-07-24 DIAGNOSIS — R0602 Shortness of breath: Secondary | ICD-10-CM | POA: Diagnosis present

## 2017-07-24 DIAGNOSIS — Z881 Allergy status to other antibiotic agents status: Secondary | ICD-10-CM | POA: Diagnosis not present

## 2017-07-24 DIAGNOSIS — Z885 Allergy status to narcotic agent status: Secondary | ICD-10-CM

## 2017-07-24 DIAGNOSIS — F17210 Nicotine dependence, cigarettes, uncomplicated: Secondary | ICD-10-CM | POA: Diagnosis present

## 2017-07-24 DIAGNOSIS — Z9049 Acquired absence of other specified parts of digestive tract: Secondary | ICD-10-CM

## 2017-07-24 DIAGNOSIS — J440 Chronic obstructive pulmonary disease with acute lower respiratory infection: Secondary | ICD-10-CM | POA: Diagnosis present

## 2017-07-24 DIAGNOSIS — Z882 Allergy status to sulfonamides status: Secondary | ICD-10-CM

## 2017-07-24 DIAGNOSIS — Z803 Family history of malignant neoplasm of breast: Secondary | ICD-10-CM

## 2017-07-24 DIAGNOSIS — Z9981 Dependence on supplemental oxygen: Secondary | ICD-10-CM

## 2017-07-24 DIAGNOSIS — F1120 Opioid dependence, uncomplicated: Secondary | ICD-10-CM | POA: Diagnosis present

## 2017-07-24 DIAGNOSIS — J441 Chronic obstructive pulmonary disease with (acute) exacerbation: Principal | ICD-10-CM | POA: Diagnosis present

## 2017-07-24 DIAGNOSIS — G40909 Epilepsy, unspecified, not intractable, without status epilepticus: Secondary | ICD-10-CM | POA: Diagnosis present

## 2017-07-24 DIAGNOSIS — J209 Acute bronchitis, unspecified: Secondary | ICD-10-CM | POA: Diagnosis present

## 2017-07-24 DIAGNOSIS — Z91041 Radiographic dye allergy status: Secondary | ICD-10-CM

## 2017-07-24 DIAGNOSIS — Z8619 Personal history of other infectious and parasitic diseases: Secondary | ICD-10-CM

## 2017-07-24 DIAGNOSIS — R7989 Other specified abnormal findings of blood chemistry: Secondary | ICD-10-CM

## 2017-07-24 DIAGNOSIS — I214 Non-ST elevation (NSTEMI) myocardial infarction: Secondary | ICD-10-CM | POA: Diagnosis present

## 2017-07-24 DIAGNOSIS — R778 Other specified abnormalities of plasma proteins: Secondary | ICD-10-CM

## 2017-07-24 LAB — CBC WITH DIFFERENTIAL/PLATELET
BASOS PCT: 0 %
Basophils Absolute: 0 10*3/uL (ref 0–0.1)
EOS PCT: 0 %
Eosinophils Absolute: 0 10*3/uL (ref 0–0.7)
HCT: 43.9 % (ref 35.0–47.0)
Hemoglobin: 14.8 g/dL (ref 12.0–16.0)
LYMPHS ABS: 0.7 10*3/uL — AB (ref 1.0–3.6)
Lymphocytes Relative: 10 %
MCH: 32.5 pg (ref 26.0–34.0)
MCHC: 33.8 g/dL (ref 32.0–36.0)
MCV: 96.2 fL (ref 80.0–100.0)
MONO ABS: 0.7 10*3/uL (ref 0.2–0.9)
MONOS PCT: 10 %
Neutro Abs: 5.6 10*3/uL (ref 1.4–6.5)
Neutrophils Relative %: 80 %
Platelets: 166 10*3/uL (ref 150–440)
RBC: 4.56 MIL/uL (ref 3.80–5.20)
RDW: 14.6 % — AB (ref 11.5–14.5)
WBC: 7 10*3/uL (ref 3.6–11.0)

## 2017-07-24 LAB — BASIC METABOLIC PANEL
Anion gap: 12 (ref 5–15)
BUN: 9 mg/dL (ref 6–20)
CALCIUM: 9.2 mg/dL (ref 8.9–10.3)
CHLORIDE: 95 mmol/L — AB (ref 101–111)
CO2: 29 mmol/L (ref 22–32)
CREATININE: 0.94 mg/dL (ref 0.44–1.00)
GFR calc Af Amer: >60 mL/min (ref >60)
GFR calc non Af Amer: >60 mL/min (ref >60)
Glucose, Bld: 108 mg/dL — ABNORMAL HIGH (ref 65–99)
Potassium: 4.2 mmol/L (ref 3.5–5.1)
Sodium: 136 mmol/L (ref 135–145)

## 2017-07-24 LAB — TROPONIN I: TROPONIN I: 0.07 ng/mL — AB (ref <0.03)

## 2017-07-24 LAB — INFLUENZA PANEL BY PCR (TYPE A & B)
Influenza A By PCR: NEGATIVE
Influenza B By PCR: NEGATIVE

## 2017-07-24 MED ORDER — IPRATROPIUM-ALBUTEROL 0.5-2.5 (3) MG/3ML IN SOLN
RESPIRATORY_TRACT | Status: AC
  Filled 2017-07-24: qty 9

## 2017-07-24 MED ORDER — METHYLPREDNISOLONE SODIUM SUCC 125 MG IJ SOLR
INTRAMUSCULAR | Status: AC
  Administered 2017-07-24: 125 mg
  Filled 2017-07-24: qty 2

## 2017-07-24 MED ORDER — IPRATROPIUM-ALBUTEROL 0.5-2.5 (3) MG/3ML IN SOLN
6.0000 mL | Freq: Once | RESPIRATORY_TRACT | Status: AC
  Administered 2017-07-24: 6 mL via RESPIRATORY_TRACT

## 2017-07-24 MED ORDER — MAGNESIUM SULFATE 2 GM/50ML IV SOLN
2.0000 g | Freq: Once | INTRAVENOUS | Status: AC
  Administered 2017-07-24: 2 g via INTRAVENOUS

## 2017-07-24 MED ORDER — MAGNESIUM SULFATE 2 GM/50ML IV SOLN
INTRAVENOUS | Status: AC
  Filled 2017-07-24: qty 50

## 2017-07-24 NOTE — ED Triage Notes (Signed)
Pt arrives via [facility] with c/o SOB x3 days. Pt has hx of COPD and has had a cough for the same time. Pt is on 2L chronically at home and was 86% at the time of EMS arrival. Pt has been attempting to take her albuterol at home without success.

## 2017-07-24 NOTE — ED Provider Notes (Signed)
[facility] Emergency Department Provider Note   ____________________________________________   First MD Initiated Contact with Patient 07/24/17 2141     (approximate)  I have reviewed the triage vital signs and the nursing notes.   HISTORY  Chief Complaint Shortness of Breath    HPI Natalie [name] is a 62 y.o. female Patient has COPD he is chronically on 2 L oxygen at home. She reports increasing shortness of breath for the last couple days. O2 sats were 86% on 2 L at home. She's been using albuterol iat home without relief EMS gave her 1 DuoNeb and 1 albuterol nebulizers without any success. Here on 4 L oxygen she goes up to 92% she reports occasional cough productive of small amounts of clear phlegm no fever  Past Medical History:  Diagnosis Date  . Cataracts, bilateral   . Chickenpox cataracts  . COPD (chronic obstructive pulmonary disease) ([place])   . Depression   . Drug addiction ([place])   . Headache   . Hepatitis C virus infection   . Hepatitis C without hepatic coma   . History of chicken pox   . History of colon polyps   . Seizures [facility])     Patient Active Problem List   Diagnosis Date Noted  . NSTEMI (non-ST elevated myocardial infarction) ([place]) 07/24/2017  . Cataracts, bilateral 03/30/2017  . COPD (chronic obstructive pulmonary disease) ([place]) 03/30/2017  . Depression 03/30/2017  . Drug addiction ([place]) 03/30/2017  . Seizures ([place]) 03/30/2017  . Chronic migraine without aura without status migrainosus, not intractable 07/26/2014  . 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 CKD (chronic kidney disease) stage 3, GFR 30-59 ml/min (HCC) 08/04/2013  . Edema 03/24/2011  . Tobacco use disorder 03/24/2011  . 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 Migraine headache 01/10/2011    Past Surgical History:  Procedure Laterality Date  . APPENDECTOMY    . CHOLECYSTECTOMY    . COLONOSCOPY WITH PROPOFOL N/A 04/30/2015   Procedure: COLONOSCOPY WITH PROPOFOL;  Surgeon: Josefine Class, MD;  Location[facility] ENDOSCOPY;  Service: Endoscopy;  Laterality: N/A;  . COLONOSCOPY WITH PROPOFOL N/A 07/13/2017   Procedure: COLONOSCOPY WITH PROPOFOL;  Surgeon: Manya Silvas, MD;  [facility] ENDOSCOPY;  Service: Endoscopy;  Laterality: N/A;  . ESOPHAGOGASTRODUODENOSCOPY (EGD) WITH PROPOFOL N/A 07/13/2017   Procedure: ESOPHAGOGASTRODUODENOSCOPY (EGD) WITH PROPOFOL;  Surgeon: Manya Silvas, MD;  Location: [facility] ENDOSCOPY;  Service: Endoscopy;  Laterality: N/A;  . EYE SURGERY      Prior to Admission medications   Medication Sig Start Date End Date Taking? Authorizing Provider  acetaminophen (TYLENOL) 500 MG tablet Take 500 mg by mouth every 6 (six) hours as needed.   Yes [provider]  albuterol (PROVENTIL HFA;VENTOLIN HFA) 108 (90 BASE) MCG/ACT inhaler Inhale 2 puffs into the lungs every 6 (six) hours as needed for wheezing or shortness of breath.   Yes [provider]  gabapentin (NEURONTIN) 300 MG capsule Take 300 mg by mouth 3 (three) times daily.   Yes [provider]  methadone (DOLOPHINE) 10 MG tablet Take 140 mg by mouth daily.    Yes [provider]  mirtazapine (REMERON) 30 MG tablet Take 30 mg by mouth at bedtime.   Yes [provider]  ondansetron (ZOFRAN-ODT) 4 MG disintegrating tablet Take 4 mg by mouth every 8 (eight) hours as needed for nausea or vomiting.   Yes [provider]  tiotropium (SPIRIVA) 18 MCG inhalation capsule Place into  inhaler and inhale. 03/25/17 03/25/18 Yes [provider]  traZODone (DESYREL) 100 MG tablet Take 100 mg by mouth at bedtime.   Yes [provider]    Allergies Aspirin; Darvon [propoxyphene]; Iodine; Penicillins; Sulfa antibiotics; and Zithromax [azithromycin]  Family History  Problem Relation Age of Onset  . Breast cancer Paternal Grandmother     Social History Social History   Tobacco Use    . Smoking status: Current Every Day Smoker    Packs/day: 0.50    Years: 41.00    Pack years: 20.50    Types: Cigarettes  . Smokeless tobacco: Never Used  Substance Use Topics  . Alcohol use: No  . Drug use: No    Review of Systems  Constitutional: No fever/chills Eyes: No visual changes. ENT: No sore throat. Cardiovascular: Denies chest pain. Respiratory: shortness of breath. Gastrointestinal: No abdominal pain.  No nausea, no vomiting.  No diarrhea.  No constipation. Genitourinary: Negative for dysuria. Musculoskeletal: Negative for back pain. Skin: Negative for rash. Neurological: Negative for headaches, focal weakness  ____________________________________________   PHYSICAL EXAM:  VITAL SIGNS: ED Triage Vitals  Enc Vitals Group     BP 07/24/17 2138 (!) 174/102     Pulse Rate 07/24/17 2138 87     Resp 07/24/17 2138 19     Temp 07/24/17 2138 98.3 F (36.8 C)     Temp Source 07/24/17 2138 Oral     SpO2 07/24/17 2135 (!) 83 %     Weight 07/24/17 2146 100 lb (45.4 kg)     Height 07/24/17 2146 5\' 2"  (1.575 m)     Head Circumference --      Peak Flow --      Pain Score 07/24/17 2135 0     Pain Loc --      Pain Edu? --      Excl. in [place]? --     Constitutional: Alert and oriented.Sheran Fava of breath Eyes: Conjunctivae are normal.  Head: Atraumatic. Nose: No congestion/rhinnorhea. Mouth/Throat: Mucous membranes are moist.  Oropharynx non-erythematous. Neck: No stridor. Cardiovascular: Normal rate, regular rhythm. Grossly normal heart sounds.  Good peripheral circulation. Respiratoryincreased respiratory effort. retractions. Lungs decreased air movement Gastrointestinal: Soft and nontender. No distention. No abdominal bruits. No CVA tenderness. Musculoskeletal: No lower extremity tenderness nor edema. Neurologic:  Normal speech and language. No gross focal neurologic deficits are appreciated.  Skin:  Skin is warm, dry and intact. No rash noted. Psychiatric: Mood  and affect are normal. Speech and behavior are normal.  ____________________________________________   LABS (all labs ordered are listed, but only abnormal results are displayed)  Labs Reviewed  CBC WITH DIFFERENTIAL/PLATELET - Abnormal; Notable for the following components:      Result Value   RDW 14.6 (*)    Lymphs Abs 0.7 (*)    All other components within normal limits  BASIC METABOLIC PANEL - Abnormal; Notable for the following components:   Chloride 95 (*)    Glucose, Bld 108 (*)    All other components within normal limits  TROPONIN I - Abnormal; Notable for the following components:   Troponin I 0.07 (*)    All other components within normal limits  INFLUENZA PANEL BY PCR (TYPE A & B)   ____________________________________________  EKG  EKG read and interpreted by me shows normal sinus rhythm rate of 93 normal axis nonspecific ST-T wave changes ____________________________________________  RADIOLOGY  Dg Chest Port 1 View  Result Date: 07/24/2017 CLINICAL DATA:  Shortness of breath  EXAM: PORTABLE CHEST 1 VIEW COMPARISON:  11/28/2016 FINDINGS: Hyperinflation with diffuse bronchitic changes. No focal consolidation or effusion. Normal heart size. Aortic atherosclerosis. No pneumothorax. IMPRESSION: Hyperinflation with diffuse bronchitic changes. No acute pulmonary infiltrate is seen. Electronically Signed   By: Donavan Foil M.D.   On: 07/24/2017 22:16   chest x-ray only shows hyperinflation ____________________________________________   PROCEDURES  Procedure(s) performed:   Procedures  Critical Care performed:   ____________________________________________   INITIAL IMPRESSION / ASSESSMENT AND PLAN / ED COURSE  patient had 2 more DuoNeb since the emergency room along with site Medrol and 2 g of magnesium. Patient is moving air now but not very well. O2 sats are 94% on 4 L troponin is somewhat elevated      ____________________________________________   FINAL CLINICAL IMPRESSION(S) / ED DIAGNOSES  Final diagnoses:  SOB (shortness of breath)  COPD exacerbation (HCC)  Elevated troponin     ED Discharge Orders    None       Note:  This document was prepared using Dragon voice recognition software and may include unintentional dictation errors.    Nena Polio, MD 07/25/17 [id number]

## 2017-07-24 NOTE — ED Notes (Signed)
Per EMS, IV start attempted x 2 en route without success.

## 2017-07-24 NOTE — ED Notes (Signed)
Lab called this RN with elevated troponin on pt. MD Malinda made aware.

## 2017-07-25 ENCOUNTER — Other Ambulatory Visit: Payer: Self-pay

## 2017-07-25 LAB — CBC
HCT: 42.4 % (ref 35.0–47.0)
Hemoglobin: 14.3 g/dL (ref 12.0–16.0)
MCH: 32.9 pg (ref 26.0–34.0)
MCHC: 33.7 g/dL (ref 32.0–36.0)
MCV: 97.8 fL (ref 80.0–100.0)
PLATELETS: 138 10*3/uL — AB (ref 150–440)
RBC: 4.33 MIL/uL (ref 3.80–5.20)
RDW: 14.6 % — AB (ref 11.5–14.5)
WBC: 4.6 10*3/uL (ref 3.6–11.0)

## 2017-07-25 LAB — BASIC METABOLIC PANEL
Anion gap: 11 (ref 5–15)
BUN: 9 mg/dL (ref 6–20)
CALCIUM: 9 mg/dL (ref 8.9–10.3)
CO2: 31 mmol/L (ref 22–32)
Chloride: 95 mmol/L — ABNORMAL LOW (ref 101–111)
Creatinine, Ser: 0.87 mg/dL (ref 0.44–1.00)
GFR calc Af Amer: >60 mL/min (ref >60)
GLUCOSE: 251 mg/dL — AB (ref 65–99)
Potassium: 3.8 mmol/L (ref 3.5–5.1)
Sodium: 137 mmol/L (ref 135–145)

## 2017-07-25 LAB — GLUCOSE, CAPILLARY: GLUCOSE-CAPILLARY: 186 mg/dL — AB (ref 65–99)

## 2017-07-25 LAB — TROPONIN I: TROPONIN I: 0.06 ng/mL — AB (ref <0.03)

## 2017-07-25 MED ORDER — AZITHROMYCIN 500 MG PO TABS: 500.0000 mg | ORAL_TABLET | Freq: Every day | ORAL | 0 refills | Status: AC

## 2017-07-25 MED ORDER — ACETAMINOPHEN 650 MG RE SUPP: 650.0000 mg | Freq: Four times a day (QID) | RECTAL | Status: DC | PRN

## 2017-07-25 MED ORDER — METHADONE HCL 10 MG PO TABS
140.0000 mg | ORAL_TABLET | Freq: Every day | ORAL | Status: DC
  Administered 2017-07-25: 140 mg via ORAL
  Filled 2017-07-25: qty 14

## 2017-07-25 MED ORDER — ONDANSETRON HCL 4 MG PO TABS: 4.0000 mg | ORAL_TABLET | Freq: Four times a day (QID) | ORAL | Status: DC | PRN

## 2017-07-25 MED ORDER — TRAZODONE HCL 100 MG PO TABS
100.0000 mg | ORAL_TABLET | Freq: Every day | ORAL | Status: DC
  Administered 2017-07-25: 100 mg via ORAL
  Filled 2017-07-25: qty 1

## 2017-07-25 MED ORDER — ACETAMINOPHEN 325 MG PO TABS: 650.0000 mg | ORAL_TABLET | Freq: Four times a day (QID) | ORAL | Status: DC | PRN

## 2017-07-25 MED ORDER — MIRTAZAPINE 15 MG PO TABS
30.0000 mg | ORAL_TABLET | Freq: Every day | ORAL | Status: DC
  Administered 2017-07-25: 30 mg via ORAL
  Filled 2017-07-25: qty 2

## 2017-07-25 MED ORDER — PREDNISONE 50 MG PO TABS: 50.0000 mg | ORAL_TABLET | Freq: Every day | ORAL | 0 refills | Status: AC

## 2017-07-25 MED ORDER — HYDROCODONE-ACETAMINOPHEN 5-325 MG PO TABS: 1.0000 | ORAL_TABLET | ORAL | Status: DC | PRN

## 2017-07-25 MED ORDER — ONDANSETRON 4 MG PO TBDP
4.0000 mg | ORAL_TABLET | Freq: Three times a day (TID) | ORAL | Status: DC | PRN
  Filled 2017-07-25: qty 1

## 2017-07-25 MED ORDER — ONDANSETRON HCL 4 MG/2ML IJ SOLN: 4.0000 mg | Freq: Four times a day (QID) | INTRAMUSCULAR | Status: DC | PRN

## 2017-07-25 MED ORDER — GABAPENTIN 300 MG PO CAPS
300.0000 mg | ORAL_CAPSULE | Freq: Every day | ORAL | Status: DC
  Administered 2017-07-25: 300 mg via ORAL

## 2017-07-25 MED ORDER — IPRATROPIUM-ALBUTEROL 0.5-2.5 (3) MG/3ML IN SOLN
3.0000 mL | Freq: Four times a day (QID) | RESPIRATORY_TRACT | Status: DC
  Administered 2017-07-25 (×2): 3 mL via RESPIRATORY_TRACT
  Filled 2017-07-25 (×2): qty 3

## 2017-07-25 MED ORDER — DEXTROSE 5 % IV SOLN
500.0000 mg | INTRAVENOUS | Status: DC
  Administered 2017-07-25: 500 mg via INTRAVENOUS
  Filled 2017-07-25 (×2): qty 500

## 2017-07-25 MED ORDER — METHYLPREDNISOLONE SODIUM SUCC 125 MG IJ SOLR
60.0000 mg | Freq: Four times a day (QID) | INTRAMUSCULAR | Status: DC
  Administered 2017-07-25 (×2): 60 mg via INTRAVENOUS
  Filled 2017-07-25 (×2): qty 2

## 2017-07-25 MED ORDER — DOCUSATE SODIUM 100 MG PO CAPS
100.0000 mg | ORAL_CAPSULE | Freq: Two times a day (BID) | ORAL | Status: DC
  Administered 2017-07-25: 100 mg via ORAL
  Filled 2017-07-25: qty 1

## 2017-07-25 MED ORDER — IPRATROPIUM-ALBUTEROL 0.5-2.5 (3) MG/3ML IN SOLN: 3.0000 mL | Freq: Four times a day (QID) | RESPIRATORY_TRACT | 0 refills | Status: DC

## 2017-07-25 MED ORDER — DEXTROSE 5 % IV SOLN
1.0000 g | INTRAVENOUS | Status: DC
  Administered 2017-07-25: 1 g via INTRAVENOUS
  Filled 2017-07-25: qty 10

## 2017-07-25 MED ORDER — GABAPENTIN 300 MG PO CAPS
300.0000 mg | ORAL_CAPSULE | Freq: Three times a day (TID) | ORAL | Status: DC
  Filled 2017-07-25: qty 1

## 2017-07-25 MED ORDER — TRAZODONE HCL 50 MG PO TABS
25.0000 mg | ORAL_TABLET | Freq: Every evening | ORAL | Status: DC | PRN
  Filled 2017-07-25: qty 1

## 2017-07-25 MED ORDER — BISACODYL 5 MG PO TBEC: 5.0000 mg | DELAYED_RELEASE_TABLET | Freq: Every day | ORAL | Status: DC | PRN

## 2017-07-25 MED ORDER — HEPARIN SODIUM (PORCINE) 5000 UNIT/ML IJ SOLN
5000.0000 [IU] | Freq: Three times a day (TID) | INTRAMUSCULAR | Status: DC
  Administered 2017-07-25 (×2): 5000 [IU] via SUBCUTANEOUS
  Filled 2017-07-25 (×2): qty 1

## 2017-07-25 NOTE — H&P (Signed)
[place] [facility] [place] NAME: Natalie [name]    MR#:  [id number]  DATE OF BIRTH:  1956/06/02  DATE OF ADMISSION:  07/24/2017  PRIMARY CARE PHYSICIAN: Ricardo Jericho, NP   REQUESTING/REFERRING PHYSICIAN:   CHIEF COMPLAINT:   Chief Complaint  Patient presents with  . Shortness of Breath    HISTORY OF PRESENT ILLNESS: Natalie [name]  is a 62 y.o. female with a known history of COPD on chronic 2 L oxygen per nasal cannula, depression drug addiction hepatitis C and seizure disorder. Patient was brought to emergency room for acute onset of shortness of breath with hypoxemia, started suddenly a few hours before the arrival to emergency room.  She was noted with oxygen saturation at 86 at home on 2 L oxygen per nasal cannula.  He has tried DuoNeb nebulizer without much improvement.  She denies any sick contacts, no fever or chills.  She admits to occasional productive cough going on for the past week. While in the emergency room she continued to be dyspneic, despite repeat nebulizer treatment.  Her oxygen improved to 91% on 3 L of oxygen per nasal cannula.  Troponin level is noted to be slightly elevated, at 0.07.  Chest x-ray shows acute bronchitis and chronic COPD changes. She is admitted for further evaluation and treatment.  PAST MEDICAL HISTORY:   Past Medical History:  Diagnosis Date  . Cataracts, bilateral   . Chickenpox cataracts  . COPD (chronic obstructive pulmonary disease) ([place])   . Depression   . Drug addiction ([place])   . Headache   . Hepatitis C virus infection   . Hepatitis C without hepatic coma   . History of chicken pox   . History of colon polyps   . Seizures ([place])     PAST SURGICAL HISTORY:  Past Surgical History:  Procedure Laterality Date  . APPENDECTOMY    . CHOLECYSTECTOMY    . COLONOSCOPY WITH PROPOFOL N/A 04/30/2015   Procedure: COLONOSCOPY WITH PROPOFOL;  Surgeon: Josefine Class, MD;  Location: [facility]  ENDOSCOPY;  Service: Endoscopy;  Laterality: N/A;  . COLONOSCOPY WITH PROPOFOL N/A 07/13/2017   Procedure: COLONOSCOPY WITH PROPOFOL;  Surgeon: Manya Silvas, MD;  Location[facility] ENDOSCOPY;  Service: Endoscopy;  Laterality: N/A;  . ESOPHAGOGASTRODUODENOSCOPY (EGD) WITH PROPOFOL N/A 07/13/2017   Procedure: ESOPHAGOGASTRODUODENOSCOPY (EGD) WITH PROPOFOL;  Surgeon: Manya Silvas, MD;  Location: [facility] ENDOSCOPY;  Service: Endoscopy;  Laterality: N/A;  . EYE SURGERY      SOCIAL HISTORY:  Social History   Tobacco Use  . Smoking status: Current Every Day Smoker    Packs/day: 0.50    Years: 41.00    Pack years: 20.50    Types: Cigarettes  . Smokeless tobacco: Never Used  Substance Use Topics  . Alcohol use: No    FAMILY HISTORY:  Family History  Problem Relation Age of Onset  . Breast cancer Paternal Grandmother     DRUG ALLERGIES:  Allergies  Allergen Reactions  . Aspirin   . Darvon [Propoxyphene]   . Iodine   . Penicillins   . Sulfa Antibiotics   . Zithromax [Azithromycin]     REVIEW OF SYSTEMS:   CONSTITUTIONAL: No fever, but complains of fatigue and generalized weakness for the past 2-3 days.  EYES: No blurred or double vision.  EARS, NOSE, AND THROAT: No tinnitus or ear pain.  RESPIRATORY: Positive for cough, shortness of breath, and wheezing; no hemoptysis.  CARDIOVASCULAR: No chest  pain, orthopnea, edema.  GASTROINTESTINAL: No nausea, vomiting, diarrhea or abdominal pain.  GENITOURINARY: No dysuria, hematuria.  ENDOCRINE: No polyuria, nocturia,  HEMATOLOGY: No anemia, easy bruising or bleeding SKIN: No rash or lesion. MUSCULOSKELETAL: No joint pain.   NEUROLOGIC: No focal weakness.  PSYCHIATRY: No anxiety or depression.   MEDICATIONS AT HOME:  Prior to Admission medications   Medication Sig Start Date End Date Taking? Authorizing Provider  acetaminophen (TYLENOL) 500 MG tablet Take 500 mg by mouth every 6 (six) hours as needed.   Yes [provider]  albuterol (PROVENTIL HFA;VENTOLIN HFA) 108 (90 BASE) MCG/ACT inhaler Inhale 2 puffs into the lungs every 6 (six) hours as needed for wheezing or shortness of breath.   Yes [provider]  gabapentin (NEURONTIN) 300 MG capsule Take 300 mg by mouth 3 (three) times daily.   Yes [provider]  methadone (DOLOPHINE) 10 MG tablet Take 140 mg by mouth daily.    Yes [provider]  mirtazapine (REMERON) 30 MG tablet Take 30 mg by mouth at bedtime.   Yes [provider]  ondansetron (ZOFRAN-ODT) 4 MG disintegrating tablet Take 4 mg by mouth every 8 (eight) hours as needed for nausea or vomiting.   Yes [provider]  tiotropium (SPIRIVA) 18 MCG inhalation capsule Place into inhaler and inhale. 03/25/17 03/25/18 Yes [provider]  traZODone (DESYREL) 100 MG tablet Take 100 mg by mouth at bedtime.   Yes [provider]      PHYSICAL EXAMINATION:   VITAL SIGNS: Blood pressure (!) 166/67, pulse 82, temperature 98 F (36.7 C), temperature source Oral, resp. rate 18, height 5\' 2"  (1.575 m), weight 49.6 kg (109 lb 4.8 oz), SpO2 97 %.  GENERAL:  62 y.o.-year-old patient lying in the bed, in mild to moderate respiratory distress.  EYES: Pupils equal, round, reactive to light and accommodation. No scleral icterus. Extraocular muscles intact.  HEENT: Head atraumatic, normocephalic. Oropharynx and nasopharynx clear.  NECK:  Supple, no jugular venous distention. No thyroid enlargement, no tenderness.  LUNGS: Induced breath sounds and wheezing, noted bilaterally. CARDIOVASCULAR: S1, S2 normal. No S3/S4.  ABDOMEN: Soft, nontender, nondistended. Bowel sounds present. No organomegaly or mass.  EXTREMITIES: No pedal edema, cyanosis, or clubbing.  NEUROLOGIC: No focal weakness. Gait not checked.  PSYCHIATRIC: The patient is alert and oriented x 3.  SKIN: No obvious rash, lesion, or ulcer.   LABORATORY PANEL:   CBC Recent Labs  Lab  07/24/17 2140  WBC 7.0  HGB 14.8  HCT 43.9  PLT 166  MCV 96.2  MCH 32.5  MCHC 33.8  RDW 14.6*  LYMPHSABS 0.7*  MONOABS 0.7  EOSABS 0.0  BASOSABS 0.0   ------------------------------------------------------------------------------------------------------------------  Chemistries  Recent Labs  Lab 07/24/17 2140  NA 136  K 4.2  CL 95*  CO2 29  GLUCOSE 108*  BUN 9  CREATININE 0.94  CALCIUM 9.2   ------------------------------------------------------------------------------------------------------------------ estimated creatinine clearance is 50.5 mL/min (by C-G formula based on SCr of 0.94 mg/dL). ------------------------------------------------------------------------------------------------------------------ No results for input(s): TSH, T4TOTAL, T3FREE, THYROIDAB in the last 72 hours.  Invalid input(s): FREET3   Coagulation profile No results for input(s): INR, PROTIME in the last 168 hours. ------------------------------------------------------------------------------------------------------------------- No results for input(s): DDIMER in the last 72 hours. -------------------------------------------------------------------------------------------------------------------  Cardiac Enzymes Recent Labs  Lab 07/24/17 2140  TROPONINI 0.07*   ------------------------------------------------------------------------------------------------------------------ Invalid input(s): POCBNP  ---------------------------------------------------------------------------------------------------------------  Urinalysis    Component Value Date/Time   COLORURINE Yellow 05/01/2012 0957   APPEARANCEUR Clear 05/01/2012  0957   LABSPEC 1.018 05/01/2012 0957   PHURINE 6.0 05/01/2012 0957   GLUCOSEU Negative 05/01/2012 0957   HGBUR 1+ 05/01/2012 0957   BILIRUBINUR Negative 05/01/2012 0957   KETONESUR Trace 05/01/2012 0957   PROTEINUR 30 mg/dL 05/01/2012 0957   NITRITE Negative  05/01/2012 0957   LEUKOCYTESUR Negative 05/01/2012 0957     RADIOLOGY: Dg Chest Port 1 View  Result Date: 07/24/2017 CLINICAL DATA:  Shortness of breath EXAM: PORTABLE CHEST 1 VIEW COMPARISON:  11/28/2016 FINDINGS: Hyperinflation with diffuse bronchitic changes. No focal consolidation or effusion. Normal heart size. Aortic atherosclerosis. No pneumothorax. IMPRESSION: Hyperinflation with diffuse bronchitic changes. No acute pulmonary infiltrate is seen. Electronically Signed   By: Donavan Foil M.D.   On: 07/24/2017 22:16    EKG: Orders placed or performed during the hospital encounter of 07/24/17  . EKG 12-Lead  . EKG 12-Lead  . ED EKG  . ED EKG    IMPRESSION AND PLAN:  1.  Acute hypoxemic respiratory failure, secondary to acute COPD exacerbation and acute bronchitis.  We will start oxygen treatment, steroids, DuoNeb and antibiotics. 2.  Acute COPD exacerbation, management as above. 3.  Acute bronchitis, management as above under #1. 4.  Non-ST elevation MI, likely secondary to hypoxemia.  We will continue telemetry monitor and follow troponin level.  All the records are reviewed and case discussed with ED provider. Management plans discussed with the patient, family and they are in agreement.  CODE STATUS:    Code Status Orders  (From admission, onward)        Start     Ordered   07/25/17 0029  Full code  Continuous     07/25/17 0028    Code Status History    Date Active Date Inactive Code Status Order ID Comments User Context   This patient has a current code status but no historical code status.       TOTAL TIME TAKING CARE OF THIS PATIENT: 35 minutes.    Amelia Jo M.D on 07/25/2017 at 1:37 AM  Between 7am to 6pm - Pager - [phone]  After 6pm go to [email or web address] - password [email or web address] [facility]  [facility]  Office  [phone]  CC: Primary care physician; Ricardo Jericho, NP

## 2017-07-25 NOTE — Discharge Summary (Addendum)
[place] [facility] [place] NAME: Natalie [name]    MR#:  [id number]  DATE OF BIRTH:  [birth year]  DATE OF ADMISSION:  07/24/2017 ADMITTING PHYSICIAN: Amelia Jo, MD  DATE OF DISCHARGE: 07/25/2017  PRIMARY CARE PHYSICIAN: Ricardo Jericho, NP    ADMISSION DIAGNOSIS:  SOB (shortness of breath) [R06.02] Elevated troponin [R74.8] COPD exacerbation (HCC) [J44.1]  DISCHARGE DIAGNOSIS:  Copd exacerbation  SECONDARY DIAGNOSIS:   Past Medical History:  Diagnosis Date  . Cataracts, bilateral   . Chickenpox cataracts  . COPD (chronic obstructive pulmonary disease) ([place])   . Depression   . Drug addiction ([place])   . Headache   . Hepatitis C virus infection   . Hepatitis C without hepatic coma   . History of chicken pox   . History of colon polyps   . 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  1. Acute on chronic hypoxic respiratory failure in the setting of acute COPD exacerbation with acute bronchitis Patient has been weaned to her baseline oxygen of 2 L  2. Acute exacerbation of COPD with acute bronchitis: Patient will continue azithromycin for acute bronchitis and oral prednisone. She was discharged with nebulizer medications. She has a nebulizer at home. She'll continue inhalers.  3. History of drug addiction: Patient continue on methadone  4. History of hepatitis C:  5.Tobacco dependence: Patient is encouraged to quit smoking. Counseling was provided for 4 minutes.  6 elevated troponin from demand ischemia she was ruled out for ACS.  DISCHARGE CONDITIONS AND DIET:   Stable Regular diet  CONSULTS OBTAINED:    DRUG ALLERGIES:   Allergies  Allergen Reactions  . Aspirin   . Darvon [Propoxyphene]   . Iodine   . Penicillins   . Sulfa Antibiotics    . Zithromax [Azithromycin]     DISCHARGE MEDICATIONS:   Allergies as of 07/25/2017      Reactions   Aspirin    Darvon [propoxyphene]    Iodine    Penicillins    Sulfa Antibiotics    Zithromax [azithromycin]       Medication List    TAKE these medications   acetaminophen 500 MG tablet Commonly known as:  TYLENOL Take 500 mg by mouth every 6 (six) hours as needed.   albuterol 108 (90 Base) MCG/ACT inhaler Commonly known as:  PROVENTIL HFA;VENTOLIN HFA Inhale 2 puffs into the lungs every 6 (six) hours as needed for wheezing or shortness of breath.   azithromycin 500 MG tablet Commonly known as:  ZITHROMAX Take 1 tablet (500 mg total) by mouth daily for 6 days. Take 1 tablet daily for 6 days.   gabapentin 300 MG capsule Commonly known as:  NEURONTIN Take 300 mg by mouth 3 (three) times daily.   ipratropium-albuterol 0.5-2.5 (3) MG/3ML Soln Commonly known as:  DUONEB Take 3 mLs by nebulization every 6 (six) hours.   methadone 10 MG tablet Commonly known as:  DOLOPHINE Take 140 mg by mouth daily.   mirtazapine 30 MG tablet Commonly known as:  REMERON Take 30 mg by mouth at bedtime.   ondansetron 4 MG disintegrating tablet Commonly known as:  ZOFRAN-ODT Take 4 mg by mouth every 8 (eight) hours as needed for nausea or vomiting.   predniSONE 50 MG tablet Commonly  known as:  DELTASONE Take 1 tablet (50 mg total) by mouth daily with breakfast for 5 days.   tiotropium 18 MCG inhalation capsule Commonly known as:  [place] into inhaler and inhale.   traZODone 100 MG tablet Commonly known as:  DESYREL Take 100 mg by mouth at bedtime.         Today   CHIEF COMPLAINT:  Doing better ths am still with cough no wheezing   VITAL SIGNS:  Blood pressure 140/67, pulse 65, temperature (!) 97.5 F (36.4 C), temperature source Oral, resp. rate 16, height 5\' 2"  (1.575 m), weight 49.6 kg (109 lb 4.8 oz), SpO2 91 %.   REVIEW OF SYSTEMS:  Review of Systems   Constitutional: Negative.  Negative for chills, fever and malaise/fatigue.  HENT: Negative.  Negative for ear discharge, ear pain, hearing loss, nosebleeds and sore throat.   Eyes: Negative.  Negative for blurred vision and pain.  Respiratory: Positive for cough. Negative for hemoptysis, shortness of breath and wheezing.   Cardiovascular: Negative.  Negative for chest pain, palpitations and leg swelling.  Gastrointestinal: Negative.  Negative for abdominal pain, blood in stool, diarrhea, nausea and vomiting.  Genitourinary: Negative.  Negative for dysuria.  Musculoskeletal: Negative.  Negative for back pain.  Skin: Negative.   Neurological: Negative for dizziness, tremors, speech change, focal weakness, seizures and headaches.  Endo/Heme/Allergies: Negative.  Does not bruise/bleed easily.  Psychiatric/Behavioral: Negative.  Negative for depression, hallucinations and suicidal ideas.     PHYSICAL EXAMINATION:  GENERAL:  62 y.o.-year-old patient lying in the bed with no acute distress.  NECK:  Supple, no jugular venous distention. No thyroid enlargement, no tenderness.  LUNGS: Normal breath sounds bilaterally, no wheezing, rales,rhonchi  No use of accessory muscles of respiration.  CARDIOVASCULAR: S1, S2 normal. No murmurs, rubs, or gallops.  ABDOMEN: Soft, non-tender, non-distended. Bowel sounds present. No organomegaly or mass.  EXTREMITIES: No pedal edema, cyanosis, or clubbing.  PSYCHIATRIC: The patient is alert and oriented x 3.  SKIN: No obvious rash, lesion, or ulcer.   DATA REVIEW:   CBC Recent Labs  Lab 07/25/17 0444  WBC 4.6  HGB 14.3  HCT 42.4  PLT 138*    Chemistries  Recent Labs  Lab 07/25/17 0444  NA 137  K 3.8  CL 95*  CO2 31  GLUCOSE 251*  BUN 9  CREATININE 0.87  CALCIUM 9.0    Cardiac Enzymes Recent Labs  Lab 07/24/17 2140 07/25/17 0444  TROPONINI 0.07* 0.06*    Microbiology Results  @[email or web address] @  RADIOLOGY:  Dg Chest Port 1  View  Result Date: 07/24/2017 CLINICAL DATA:  Shortness of breath EXAM: PORTABLE CHEST 1 VIEW COMPARISON:  11/28/2016 FINDINGS: Hyperinflation with diffuse bronchitic changes. No focal consolidation or effusion. Normal heart size. Aortic atherosclerosis. No pneumothorax. IMPRESSION: Hyperinflation with diffuse bronchitic changes. No acute pulmonary infiltrate is seen. Electronically Signed   By: Donavan Foil M.D.   On: 07/24/2017 22:16      Allergies as of 07/25/2017      Reactions   Aspirin    Darvon [propoxyphene]    Iodine    Penicillins    Sulfa Antibiotics    Zithromax [azithromycin]       Medication List    TAKE these medications   acetaminophen 500 MG tablet Commonly known as:  TYLENOL Take 500 mg by mouth every 6 (six) hours as needed.   albuterol 108 (90 Base) MCG/ACT inhaler Commonly known as:  PROVENTIL HFA;VENTOLIN HFA Inhale 2 puffs  into the lungs every 6 (six) hours as needed for wheezing or shortness of breath.   azithromycin 500 MG tablet Commonly known as:  ZITHROMAX Take 1 tablet (500 mg total) by mouth daily for 6 days. Take 1 tablet daily for 6 days.   gabapentin 300 MG capsule Commonly known as:  NEURONTIN Take 300 mg by mouth 3 (three) times daily.   ipratropium-albuterol 0.5-2.5 (3) MG/3ML Soln Commonly known as:  DUONEB Take 3 mLs by nebulization every 6 (six) hours.   methadone 10 MG tablet Commonly known as:  DOLOPHINE Take 140 mg by mouth daily.   mirtazapine 30 MG tablet Commonly known as:  REMERON Take 30 mg by mouth at bedtime.   ondansetron 4 MG disintegrating tablet Commonly known as:  ZOFRAN-ODT Take 4 mg by mouth every 8 (eight) hours as needed for nausea or vomiting.   predniSONE 50 MG tablet Commonly known as:  DELTASONE Take 1 tablet (50 mg total) by mouth daily with breakfast for 5 days.   tiotropium 18 MCG inhalation capsule Commonly known as:  [place] into inhaler and inhale.   traZODone 100 MG tablet Commonly  known as:  DESYREL Take 100 mg by mouth at bedtime.          Management plans discussed with the patient and she is in agreement. Stable for discharge home  Patient should follow up with pcp  CODE STATUS:     Code Status Orders  (From admission, onward)        Start     Ordered   07/25/17 0029  Full code  Continuous     07/25/17 0028    Code Status History    Date Active Date Inactive Code Status Order ID Comments User Context   This patient has a current code status but no historical code status.      TOTAL TIME TAKING CARE OF THIS PATIENT: 38 minutes.    Note: This dictation was prepared with Dragon dictation along with smaller phrase technology. Any transcriptional errors that result from this process are unintentional.  Rafay Dahan M.D on 07/25/2017 at 10:33 AM  Between 7am to 6pm - Pager - [phone] After 6pm go to [email or web address] - password [email or web address] [facility]  Office  [phone]  CC: Primary care physician; Ricardo Jericho, NP

## 2017-07-25 NOTE — Discharge Planning (Signed)
Discharge instructions reviewed with pt. Pt verbalizes understanding. Prescriptions provided to pt. Pt ready for discharge home.

## 2017-10-30 LAB — HIV ANTIBODY (ROUTINE TESTING W REFLEX): HIV Screen 4th Generation wRfx: NONREACTIVE

## 2018-03-19 ENCOUNTER — Other Ambulatory Visit: Payer: Self-pay | Admitting: Nephrology

## 2018-03-19 DIAGNOSIS — N183 Chronic kidney disease, stage 3 unspecified: Secondary | ICD-10-CM

## 2018-03-19 DIAGNOSIS — N179 Acute kidney failure, unspecified: Secondary | ICD-10-CM

## 2018-03-23 ENCOUNTER — Ambulatory Visit
Admission: RE | Admit: 2018-03-23 | Discharge: 2018-03-23 | Disposition: A | Discharge status: Home / Self Care | Payer: 59 | Source: Ambulatory Visit | Attending: Nephrology | Admitting: Nephrology

## 2018-03-23 DIAGNOSIS — N179 Acute kidney failure, unspecified: Secondary | ICD-10-CM

## 2018-03-23 DIAGNOSIS — N183 Chronic kidney disease, stage 3 unspecified: Secondary | ICD-10-CM

## 2018-03-23 DIAGNOSIS — N261 Atrophy of kidney (terminal): Secondary | ICD-10-CM | POA: Diagnosis not present

## 2018-03-25 ENCOUNTER — Ambulatory Visit: Payer: 59

## 2018-04-23 ENCOUNTER — Ambulatory Visit (INDEPENDENT_AMBULATORY_CARE_PROVIDER_SITE_OTHER): Payer: 59 | Admitting: Podiatry

## 2018-04-23 DIAGNOSIS — M216X1 Other acquired deformities of right foot: Secondary | ICD-10-CM

## 2018-04-23 DIAGNOSIS — M659 Synovitis and tenosynovitis, unspecified: Secondary | ICD-10-CM

## 2018-04-23 DIAGNOSIS — M2041 Other hammer toe(s) (acquired), right foot: Secondary | ICD-10-CM | POA: Diagnosis not present

## 2018-04-23 DIAGNOSIS — M21611 Bunion of right foot: Secondary | ICD-10-CM

## 2018-04-23 NOTE — Progress Notes (Signed)
   Subjective: 62 year old female presenting today with a chief complaint of a painful bunion and cramping to the right foot that has been ongoing for the past year. She states the pain radiates up the right lower extremity. She states the pain is worsening and is interested in discussing surgery at this time. She has been using her orthotics with no significant relief. Patient is here for further evaluation and treatment.   Past Medical History:  Diagnosis Date  . Cataracts, bilateral   . Chickenpox cataracts  . COPD (chronic obstructive pulmonary disease) ([place])   . Depression   . Drug addiction ([place])   . Headache   . Hepatitis C virus infection   . Hepatitis C without hepatic coma   . History of chicken pox   . History of colon polyps   . Seizures ([place])     Objective: Physical Exam General: The patient is alert and oriented x3 in no acute distress.  Dermatology: Skin is cool, dry and supple bilateral lower extremities. Negative for open lesions or macerations.  Vascular: Palpable pedal pulses bilaterally. No edema or erythema noted. Capillary refill within normal limits.  Neurological: Epicritic and protective threshold grossly intact bilaterally.   Musculoskeletal Exam: Clinical evidence of bunion deformity noted to the respective foot. There is a moderate pain on palpation range of motion of the first MPJ. Lateral deviation of the hallux noted consistent with hallux abductovalgus. Hammertoe contracture also noted on clinical exam to the 2nd digit of the right foot. Symptomatic pain on palpation and range of motion also noted to the metatarsal phalangeal joints of the respective hammertoe digits.   Pain with palpation to the medial, lateral and anterior aspects of the right ankle.   Assessment: 1. HAV w/ bunion deformity right 2. Hammertoe deformity 2nd digit right  3. Ankle synovitis right     Plan of Care:  1. Patient was evaluated.  2. Injection of 0.5 mLs [place]  Soluspan injected into the right ankle joint.  3. Today we discussed the conservative versus surgical management of the presenting pathology. The patient opts for surgical management. All possible complications and details of the procedure were explained. All patient questions were answered. No guarantees were expressed or implied. 4. Authorization for surgery was initiated today. Surgery will consist of bunionectomy with metatarsal osteotomy; PIPJ arthroplasty of the 2nd digit right.  5. Return to clinic one week post op.   Edrick Kins, DPM [facility]  Dr. Edrick Kins, [address]                                        [place], [place] [address]                Office [phone]  Fax [phone]

## 2018-04-23 NOTE — Patient Instructions (Signed)
Pre-Operative Instructions  Congratulations, you have decided to take an important step towards improving your quality of life.  You can be assured that the doctors and staff at [facility] will be with you every step of the way.  Here are some important things you should know:  1. Plan to be at the surgery center/hospital at least 1 (one) hour prior to your scheduled time, unless otherwise directed by the surgical center/hospital staff.  You must have a responsible adult accompany you, remain during the surgery and drive you home.  Make sure you have directions to the surgical center/hospital to ensure you arrive on time. 2. If you are having surgery at [facility] or [facility], you will need a copy of your medical history and physical form from your family physician within one month prior to the date of surgery. We will give you a form for your primary physician to complete.  3. We make every effort to accommodate the date you request for surgery.  However, there are times where surgery dates or times have to be moved.  We will contact you as soon as possible if a change in schedule is required.   4. No aspirin/ibuprofen for one week before surgery.  If you are on aspirin, any non-steroidal anti-inflammatory medications (Mobic, Aleve, Ibuprofen) should not be taken seven (7) days prior to your surgery.  You make take Tylenol for pain prior to surgery.  5. Medications - If you are taking daily heart and blood pressure medications, seizure, reflux, allergy, asthma, anxiety, pain or diabetes medications, make sure you notify the surgery center/hospital before the day of surgery so they can tell you which medications you should take or avoid the day of surgery. 6. No food or drink after midnight the night before surgery unless directed otherwise by surgical center/hospital staff. 7. No alcoholic beverages 24-hours prior to surgery.  No smoking 24-hours prior or 24-hours after  surgery. 8. Wear loose pants or shorts. They should be loose enough to fit over bandages, boots, and casts. 9. Don't wear slip-on shoes. Sneakers are preferred. 10. Bring your boot with you to the surgery center/hospital.  Also bring crutches or a walker if your physician has prescribed it for you.  If you do not have this equipment, it will be provided for you after surgery. 11. If you have not been contacted by the surgery center/hospital by the day before your surgery, call to confirm the date and time of your surgery. 12. Leave-time from work may vary depending on the type of surgery you have.  Appropriate arrangements should be made prior to surgery with your employer. 13. Prescriptions will be provided immediately following surgery by your doctor.  Fill these as soon as possible after surgery and take the medication as directed. Pain medications will not be refilled on weekends and must be approved by the doctor. 14. Remove nail polish on the operative foot and avoid getting pedicures prior to surgery. 15. Wash the night before surgery.  The night before surgery wash the foot and leg well with water and the antibacterial soap provided. Be sure to pay special attention to beneath the toenails and in between the toes.  Wash for at least three (3) minutes. Rinse thoroughly with water and dry well with a towel.  Perform this wash unless told not to do so by your physician.  Enclosed: 1 Ice pack (please put in freezer the night before surgery)   1 Hibiclens skin cleaner     Pre-op instructions  If you have any questions regarding the instructions, please do not hesitate to call our office.  [place]: [address], [place], [place] [address] -- [phone]  [place]: [address] [place], [place] [address] -- [phone]  [place]: [address]  [place], [place] [address] -- [phone]  [place]: [address], [address], [place], [place] [address] -- [phone]  Website:  [email or web address] 

## 2018-05-07 ENCOUNTER — Telehealth: Payer: Self-pay | Admitting: *Deleted

## 2018-05-07 NOTE — Telephone Encounter (Addendum)
"  I saw Dr. Amalia Hailey a couple of weeks ago and he said that he was going to schedule me for surgery.  I was supposed to have had it a year ago but he wouldn't do it due to my smoking.  I have stopped smoking since then so he said it was okay to schedule it.  So that's what I would like to do."  Dr. Amalia Hailey does surgery on Thursdays.  His next available date is December 19.  "Okay, December 19 it will be."  Someone from the surgical center will call you a day or two prior to your surgery date and they will give you your arrival time.  You need to register with the surgical center, instructions are in the brochure that we gave you.  Dr. Amalia Hailey also wants to get medical clearance from you Pulmonologist and your primary care physician.  I faxed medical clearance letters to Dr. Raul Del and to Ricardo Jericho, NP.

## 2018-05-20 ENCOUNTER — Telehealth: Payer: Self-pay | Admitting: *Deleted

## 2018-05-20 NOTE — Telephone Encounter (Signed)
"  I am scheduled for surgery on December 19.  I want to make sure there's nothing else I need to do."  You need to register with the surgical center online.  "I don't have a computer.  Do I need to call them?"  No, they will call you to get the information that they may need.  "I got the medical clearance letter from Dr. Dema Severin."  We haven't received that, can you send it to me.  We got the one from Dr. Wallene Huh.  He said for you to hold you NSAIDS and Asprin prior to you surgery.  "Would you like me to fax it to you?"  Yes, that will be great.  My fax number is [phone].  "Is there an extension?"  It does not have an extension.  I received the medical clearance letter.

## 2018-06-11 ENCOUNTER — Telehealth: Payer: Self-pay | Admitting: *Deleted

## 2018-06-11 NOTE — Telephone Encounter (Signed)
I am calling to let you know that Dr. Melody Haver said for you to stop taking your NSAIDS and Aspirin for at least a week prior to your surgery.  "Okay, I will , I don't take NSAIDS anyway.  Should I not take my Methadone before the surgery?  I can hold off of it until after the surgery."  I would assume so but a pre-op nurse will call you from the surgical center.  Ask them when they call you.  "Okay I will.  Thanks for calling me."

## 2018-06-11 NOTE — Telephone Encounter (Signed)
I sent clinicals to [facility] for review.  Prior authorization is required for the patient's surgery scheduled for 06/24/2018.

## 2018-06-14 ENCOUNTER — Encounter (INDEPENDENT_AMBULATORY_CARE_PROVIDER_SITE_OTHER): Payer: 59

## 2018-06-24 ENCOUNTER — Other Ambulatory Visit: Payer: Self-pay | Admitting: Podiatry

## 2018-06-24 DIAGNOSIS — M2021 Hallux rigidus, right foot: Secondary | ICD-10-CM

## 2018-06-24 DIAGNOSIS — M2041 Other hammer toe(s) (acquired), right foot: Secondary | ICD-10-CM | POA: Diagnosis not present

## 2018-06-24 DIAGNOSIS — M7751 Other enthesopathy of right foot: Secondary | ICD-10-CM

## 2018-06-24 MED ORDER — OXYCODONE-ACETAMINOPHEN 5-325 MG PO TABS: 1.0000 | ORAL_TABLET | Freq: Four times a day (QID) | ORAL | 0 refills | Status: DC | PRN

## 2018-06-24 NOTE — Progress Notes (Signed)
Postoperative pain mgmt. DOS 06/24/2018

## 2018-07-02 ENCOUNTER — Ambulatory Visit (INDEPENDENT_AMBULATORY_CARE_PROVIDER_SITE_OTHER): Payer: 59

## 2018-07-02 ENCOUNTER — Ambulatory Visit (INDEPENDENT_AMBULATORY_CARE_PROVIDER_SITE_OTHER): Payer: 59 | Admitting: Podiatry

## 2018-07-02 ENCOUNTER — Encounter: Payer: Self-pay | Admitting: Podiatry

## 2018-07-02 VITALS — BP 115/96 | HR 70 | Temp 98.1°F

## 2018-07-02 DIAGNOSIS — M21611 Bunion of right foot: Secondary | ICD-10-CM

## 2018-07-02 DIAGNOSIS — M2041 Other hammer toe(s) (acquired), right foot: Secondary | ICD-10-CM

## 2018-07-02 DIAGNOSIS — M7751 Other enthesopathy of right foot: Secondary | ICD-10-CM | POA: Diagnosis not present

## 2018-07-02 DIAGNOSIS — Z9889 Other specified postprocedural states: Secondary | ICD-10-CM

## 2018-07-02 MED ORDER — DOXYCYCLINE HYCLATE 100 MG PO TABS: 100.0000 mg | ORAL_TABLET | Freq: Two times a day (BID) | ORAL | 0 refills | Status: DC

## 2018-07-02 MED ORDER — OXYCODONE-ACETAMINOPHEN 5-325 MG PO TABS: 1.0000 | ORAL_TABLET | Freq: Four times a day (QID) | ORAL | 0 refills | Status: DC | PRN

## 2018-07-04 NOTE — Progress Notes (Signed)
   Subjective:  Patient presents today status post bunion and hammertoe repair 2nd digit right. DOS: 06/24/18. She states she is doing well overall. She notes some redness and swelling of the foot. Being on the foot increases the pain and she states it is not too bad today. Patient is here for further evaluation and treatment.   Past Medical History:  Diagnosis Date  . Cataracts, bilateral   . Chickenpox cataracts  . COPD (chronic obstructive pulmonary disease) ([place])   . Depression   . Drug addiction ([place])   . Headache   . Hepatitis C virus infection   . Hepatitis C without hepatic coma   . History of chicken pox   . History of colon polyps   . Seizures ([place])       Objective/Physical Exam Neurovascular status intact.  Skin incisions appear to be well coapted with sutures and staples intact. No dehiscence. No active bleeding noted. Moderate edema noted to the surgical extremity. Increased erythema with warmth noted localized to the surgical forefoot.   Radiographic Exam:  Orthopedic hardware and osteotomies sites appear to be stable with routine healing.  Assessment: 1. s/p bunion and hammertoe repair 2nd digit right. DOS: 06/24/18 2. Cellulitis right forefoot   Plan of Care:  1. Patient was evaluated. X-rays reviewed 2. Dressing changed. Keep clean, dry and intact.  3. Prescription for Doxycycline 100 mg #20 provided to patient.  4. Refill prescription for Percocet 5/325 mg provided to patient.  5. Return to clinic in one week.    Edrick Kins, DPM [facility]  Dr. Edrick Kins, [place]                                        [place], [place] [address]                Office [phone]  Fax [phone]

## 2018-07-09 ENCOUNTER — Encounter: Payer: Self-pay | Admitting: Podiatry

## 2018-07-09 ENCOUNTER — Ambulatory Visit (INDEPENDENT_AMBULATORY_CARE_PROVIDER_SITE_OTHER): Payer: 59 | Admitting: Podiatry

## 2018-07-09 DIAGNOSIS — Z9889 Other specified postprocedural states: Secondary | ICD-10-CM

## 2018-07-09 DIAGNOSIS — M2041 Other hammer toe(s) (acquired), right foot: Secondary | ICD-10-CM

## 2018-07-09 DIAGNOSIS — M21611 Bunion of right foot: Secondary | ICD-10-CM

## 2018-07-12 NOTE — Progress Notes (Signed)
   Subjective:  Patient presents today status post bunion and hammertoe repair 2nd digit right. DOS: 06/24/18. She states she is doing well overall. She reports a decrease in the redness and swelling of the foot. She has been taking Doxycycline as directed. There are no modifying factors noted. Patient is here for further evaluation and treatment.   Past Medical History:  Diagnosis Date  . Cataracts, bilateral   . Chickenpox cataracts  . COPD (chronic obstructive pulmonary disease) ([place])   . Depression   . Drug addiction ([place])   . Headache   . Hepatitis C virus infection   . Hepatitis C without hepatic coma   . History of chicken pox   . History of colon polyps   . Seizures ([place])       Objective/Physical Exam Neurovascular status intact. Erythema and edema improved. Maceration noted to heel and medial incision.   Assessment: 1. s/p bunion and hammertoe repair 2nd digit right. DOS: 06/24/18 2. Cellulitis right forefoot   Plan of Care:  1. Patient was evaluated. 2. Partial staples removed.  3. Dry sterile dressing applied. Keep clean, dry and intact for one week.  4. Finish oral Doxycyline prescription.  5. Post op shoe dispensed. Discontinue using CAM boot.  6. Return to clinic in one week.    Edrick Kins, DPM [facility]  Dr. Edrick Kins, [place]                                        [place], [place] [address]                Office [phone]  Fax [phone]

## 2018-07-14 ENCOUNTER — Telehealth: Payer: Self-pay | Admitting: Podiatry

## 2018-07-14 NOTE — Telephone Encounter (Signed)
I stummped my great toe this morning is painful up to my knee. I am scheduled for an appt. On Friday, so I think I will be ok, but I just wanted to check with you to see what you think.

## 2018-07-15 NOTE — Telephone Encounter (Signed)
I spoke with patient, she states she stumped her toe yesterday afternoon and wanted to make sure it was ok.  She informed me that her toe is just sore, but no increase in pain since surgery.  Her appt is tomorrow with Dr. Amalia Hailey.  I informed her that we will get an xray tomorrow to make sure every thing is doing ok.  I instructed her to keep elevated as much as she can and use ice to help with any swelling.  She verbalized understanding

## 2018-07-16 ENCOUNTER — Encounter: Payer: Self-pay | Admitting: Podiatry

## 2018-07-16 ENCOUNTER — Ambulatory Visit (INDEPENDENT_AMBULATORY_CARE_PROVIDER_SITE_OTHER): Payer: 59 | Admitting: Podiatry

## 2018-07-16 ENCOUNTER — Ambulatory Visit (INDEPENDENT_AMBULATORY_CARE_PROVIDER_SITE_OTHER): Payer: 59

## 2018-07-16 DIAGNOSIS — Z9889 Other specified postprocedural states: Secondary | ICD-10-CM

## 2018-07-16 DIAGNOSIS — M2041 Other hammer toe(s) (acquired), right foot: Secondary | ICD-10-CM

## 2018-07-16 DIAGNOSIS — M2011 Hallux valgus (acquired), right foot: Secondary | ICD-10-CM

## 2018-07-18 NOTE — Progress Notes (Signed)
   Subjective:  Patient presents today status post bunion and hammertoe repair 2nd digit right. DOS: 06/24/18. She states she has improved significantly since her last visit. She has been using the post op shoe as directed and has finished the course of Doxycycline. There are no modifying factors noted. Patient is here for further evaluation and treatment.   Past Medical History:  Diagnosis Date  . Cataracts, bilateral   . Chickenpox cataracts  . COPD (chronic obstructive pulmonary disease) ([place])   . Depression   . Drug addiction ([place])   . Headache   . Hepatitis C virus infection   . Hepatitis C without hepatic coma   . History of chicken pox   . History of colon polyps   . Seizures ([place])       Objective/Physical Exam Neurovascular status intact. No erythema. Moderate edema noted. Maceration noted to heel and medial incision.   Assessment: 1. s/p bunion and hammertoe repair 2nd digit right. DOS: 06/24/18 2. Cellulitis right forefoot - resolved    Plan of Care:  1. Patient was evaluated. 2. Remaining staples removed.  3. Dry sterile dressing applied. Keep clean, dry and intact for one week.  4. Continue weightbearing in post op shoe.  5. Return to clinic in one week for percutaneous pin removal.   Edrick Kins, DPM [facility]  Dr. Edrick Kins, [place]                                        [place], [place] [address]                Office [phone]  Fax [phone]

## 2018-07-23 ENCOUNTER — Encounter: Payer: Self-pay | Admitting: Podiatry

## 2018-07-23 ENCOUNTER — Ambulatory Visit (INDEPENDENT_AMBULATORY_CARE_PROVIDER_SITE_OTHER): Payer: 59

## 2018-07-23 ENCOUNTER — Ambulatory Visit (INDEPENDENT_AMBULATORY_CARE_PROVIDER_SITE_OTHER): Payer: 59 | Admitting: Podiatry

## 2018-07-23 DIAGNOSIS — M2011 Hallux valgus (acquired), right foot: Secondary | ICD-10-CM

## 2018-07-23 DIAGNOSIS — Z9889 Other specified postprocedural states: Secondary | ICD-10-CM

## 2018-07-23 MED ORDER — CEPHALEXIN 500 MG PO CAPS: 500.0000 mg | ORAL_CAPSULE | Freq: Three times a day (TID) | ORAL | 0 refills | Status: DC

## 2018-07-25 NOTE — Progress Notes (Signed)
   Subjective:  Patient presents today status post bunion and hammertoe repair 2nd digit right. DOS: 06/24/18. She states she is improving well. She states the wound is closing appropriately. She denies any significant pain or modifying factors. She has been using the post op shoes as directed. Patient is here for further evaluation and treatment.   Past Medical History:  Diagnosis Date  . Cataracts, bilateral   . Chickenpox cataracts  . COPD (chronic obstructive pulmonary disease) ([place])   . Depression   . Drug addiction ([place])   . Headache   . Hepatitis C virus infection   . Hepatitis C without hepatic coma   . History of chicken pox   . History of colon polyps   . Seizures ([place])       Objective/Physical Exam Neurovascular status intact. There is some dehiscence along the bunion incision site with minimal drainage.   Radiographic Exam:  Orthopedic hardware and osteotomies sites appear to be stable with routine healing.   Assessment: 1. s/p bunion and hammertoe repair 2nd digit right. DOS: 06/24/18  Plan of Care:  1. Patient was evaluated. X-Rays reviewed.  2. Percutaneous pin removed.  3. Continue using post op shoes.  4. Prescription for Keflex 500 mg TID provided to patient.  5. Return to clinic in 3 weeks.   Edrick Kins, DPM [facility]  Dr. Edrick Kins, [place]                                        [place], [place] [address]                Office [phone]  Fax [phone]

## 2018-08-13 ENCOUNTER — Encounter: Payer: 59 | Admitting: Podiatry

## 2018-08-17 NOTE — Progress Notes (Signed)
This encounter was created in error - please disregard.

## 2018-08-27 ENCOUNTER — Ambulatory Visit: Payer: 59 | Admitting: Podiatry

## 2018-09-03 ENCOUNTER — Ambulatory Visit (INDEPENDENT_AMBULATORY_CARE_PROVIDER_SITE_OTHER): Payer: 59 | Admitting: Podiatry

## 2018-09-03 ENCOUNTER — Ambulatory Visit (INDEPENDENT_AMBULATORY_CARE_PROVIDER_SITE_OTHER): Payer: 59

## 2018-09-03 DIAGNOSIS — M2011 Hallux valgus (acquired), right foot: Secondary | ICD-10-CM

## 2018-09-03 DIAGNOSIS — Z9889 Other specified postprocedural states: Secondary | ICD-10-CM

## 2018-09-07 NOTE — Progress Notes (Signed)
   Subjective:  Patient presents today status post bunion and hammertoe repair 2nd digit right. DOS: 06/24/18. She reports some intermittent pain when moving her toes. She reports associated intermittent swelling. She reports finishing the course of Keflex as directed. Patient is here for further evaluation and treatment.   Past Medical History:  Diagnosis Date  . Cataracts, bilateral   . Chickenpox cataracts  . COPD (chronic obstructive pulmonary disease) ([place])   . Depression   . Drug addiction ([place])   . Headache   . Hepatitis C virus infection   . Hepatitis C without hepatic coma   . History of chicken pox   . History of colon polyps   . Seizures ([place])       Objective/Physical Exam Neurovascular status intact.  Skin incisions appear to be well coapted. No sign of infectious process noted. No dehiscence. No active bleeding noted. Moderate edema noted to the surgical extremity.  Radiographic Exam:  Orthopedic hardware and osteotomies sites appear to be stable with routine healing.   Assessment: 1. s/p bunion and hammertoe repair 2nd digit right. DOS: 06/24/18  Plan of Care:  1. Patient was evaluated. X-Rays reviewed.  2. Recommended good shoe gear.  3. May resume full activity with no restrictions.  4. Return to clinic as needed.    Edrick Kins, DPM [facility]  Dr. Edrick Kins, [place]                                        [place], [place] [address]                Office [phone]  Fax [phone]

## 2018-09-15 ENCOUNTER — Encounter: Payer: Self-pay | Admitting: Podiatry

## 2019-01-04 ENCOUNTER — Other Ambulatory Visit: Payer: Self-pay | Admitting: Nephrology

## 2019-01-04 DIAGNOSIS — N179 Acute kidney failure, unspecified: Secondary | ICD-10-CM

## 2019-01-04 DIAGNOSIS — N183 Chronic kidney disease, stage 3 unspecified: Secondary | ICD-10-CM

## 2019-01-12 ENCOUNTER — Ambulatory Visit
Admission: RE | Admit: 2019-01-12 | Discharge: 2019-01-12 | Disposition: A | Discharge status: Home / Self Care | Payer: 59 | Source: Ambulatory Visit | Attending: Nephrology | Admitting: Nephrology

## 2019-01-12 ENCOUNTER — Other Ambulatory Visit: Payer: Self-pay

## 2019-01-12 DIAGNOSIS — N183 Chronic kidney disease, stage 3 unspecified: Secondary | ICD-10-CM

## 2019-01-12 DIAGNOSIS — N179 Acute kidney failure, unspecified: Secondary | ICD-10-CM | POA: Diagnosis not present

## 2019-07-22 ENCOUNTER — Other Ambulatory Visit: Payer: Self-pay | Admitting: Family Medicine

## 2019-07-22 DIAGNOSIS — Z1231 Encounter for screening mammogram for malignant neoplasm of breast: Secondary | ICD-10-CM

## 2019-08-01 ENCOUNTER — Other Ambulatory Visit: Payer: Self-pay | Admitting: Family Medicine

## 2019-08-01 DIAGNOSIS — N644 Mastodynia: Secondary | ICD-10-CM

## 2019-08-02 ENCOUNTER — Other Ambulatory Visit: Payer: Self-pay | Admitting: Family Medicine

## 2019-10-19 ENCOUNTER — Other Ambulatory Visit: Payer: Self-pay | Admitting: Family Medicine

## 2019-10-19 DIAGNOSIS — N644 Mastodynia: Secondary | ICD-10-CM

## 2020-04-03 ENCOUNTER — Ambulatory Visit: Payer: 59 | Admitting: Podiatry

## 2020-04-10 ENCOUNTER — Ambulatory Visit (INDEPENDENT_AMBULATORY_CARE_PROVIDER_SITE_OTHER): Payer: 59

## 2020-04-10 ENCOUNTER — Other Ambulatory Visit: Payer: Self-pay

## 2020-04-10 ENCOUNTER — Ambulatory Visit (INDEPENDENT_AMBULATORY_CARE_PROVIDER_SITE_OTHER): Payer: Medicaid Other | Admitting: Podiatry

## 2020-04-10 DIAGNOSIS — M2041 Other hammer toe(s) (acquired), right foot: Secondary | ICD-10-CM | POA: Diagnosis not present

## 2020-04-10 NOTE — Progress Notes (Signed)
   HPI: 64 y.o. female presenting today for evaluation of symptomatic hammertoes to the second through fifth digits of the right foot.  Patient has history of bunionectomy surgery with hammertoe repair of the second digit of the right foot.  DOS: 06/24/2018.  Patient states that the bunion and hammertoe were doing very well.  She noticed that several months ago she caught her second toe on a pair of socks and she felt and heard the toe bent to the side.  It has been sideways ever since.  Today she would like to have the hammertoes addressed to the right foot.  Past Medical History:  Diagnosis Date  . Cataracts, bilateral   . Chickenpox cataracts  . COPD (chronic obstructive pulmonary disease) ([place])   . Depression   . Drug addiction ([place])   . Headache   . Hepatitis C virus infection   . Hepatitis C without hepatic coma   . History of chicken pox   . History of colon polyps   . Seizures ([place])       Objective: Physical Exam General: The patient is alert and oriented x3 in no acute distress.  Dermatology: Skin is cool, dry and supple bilateral lower extremities. Negative for open lesions or macerations.  Vascular: Palpable pedal pulses bilaterally. No edema or erythema noted. Capillary refill within normal limits.  Neurological: Epicritic and protective threshold grossly intact bilaterally.   Musculoskeletal Exam: All pedal and ankle joints range of motion within normal limits bilateral. Muscle strength 5/5 in all groups bilateral. Hammertoe contracture deformity noted to digits 2-5 of the right foot.  Radiographic Exam: Hammertoe contracture deformity noted to the interphalangeal joints and MPJ of the respective hammertoe digits mentioned on clinical musculoskeletal exam.  There is some lateral deviation of the second digit of the right foot as well   Assessment: 1.  Hammertoe 2-5 right foot 2.  History of bunionectomy and second hammertoe surgery right.  DOS: 06/24/2018   Plan of  Care:  1. Patient evaluated. X-Rays reviewed.  2. Today we discussed the conservative versus surgical management of the presenting pathology. The patient opts for surgical management. All possible complications and details of the procedure were explained. All patient questions were answered. No guarantees were expressed or implied. 3. Authorization for surgery was initiated today. Surgery will consist of PIPJ arthroplasty digits 2-5 right foot.  MTPJ capsulotomy 2-5 right foot.  Weil decompression osteotomy second metatarsal right foot. 4.  Medical clearance required from PCP prior to surgery due to patient's multiple comorbidities, however she has shown good potential in healing since last surgery and there has been no change in her health since surgery in 2019.  5.  Return to clinic 1 week postop      Edrick Kins, DPM [facility]  Dr. Edrick Kins, DPM    [address], [place] [address]                Office [phone]  Fax [phone]

## 2020-04-26 ENCOUNTER — Encounter: Payer: Self-pay | Admitting: Podiatry

## 2020-05-24 ENCOUNTER — Other Ambulatory Visit: Payer: Self-pay | Admitting: Podiatry

## 2020-05-24 DIAGNOSIS — M21541 Acquired clubfoot, right foot: Secondary | ICD-10-CM | POA: Diagnosis not present

## 2020-05-24 DIAGNOSIS — M2041 Other hammer toe(s) (acquired), right foot: Secondary | ICD-10-CM | POA: Diagnosis not present

## 2020-05-24 MED ORDER — OXYCODONE-ACETAMINOPHEN 5-325 MG PO TABS: 1.0000 | ORAL_TABLET | ORAL | 0 refills | Status: DC | PRN

## 2020-05-24 NOTE — Progress Notes (Signed)
PRN postop 

## 2020-05-29 ENCOUNTER — Ambulatory Visit (INDEPENDENT_AMBULATORY_CARE_PROVIDER_SITE_OTHER): Payer: BC Managed Care – PPO | Admitting: Podiatry

## 2020-05-29 ENCOUNTER — Ambulatory Visit (INDEPENDENT_AMBULATORY_CARE_PROVIDER_SITE_OTHER): Payer: BC Managed Care – PPO

## 2020-05-29 ENCOUNTER — Other Ambulatory Visit: Payer: Self-pay

## 2020-05-29 DIAGNOSIS — Z9889 Other specified postprocedural states: Secondary | ICD-10-CM

## 2020-05-29 DIAGNOSIS — M2041 Other hammer toe(s) (acquired), right foot: Secondary | ICD-10-CM

## 2020-05-29 MED ORDER — DOXYCYCLINE HYCLATE 100 MG PO TABS: 100.0000 mg | ORAL_TABLET | Freq: Two times a day (BID) | ORAL | 0 refills | Status: DC

## 2020-05-29 NOTE — Progress Notes (Signed)
   Subjective:  Patient presents today status post hammertoe repair 2-5 right foot. DOS: 05/24/2020.  Patient also has history of bunionectomy surgery with hammertoe repair of the second digit right foot.  DOS: 06/24/2018.  Patient states that she is doing well.  She is wearing the cam boot as instructed.  No new complaints at this time.  Past Medical History:  Diagnosis Date  . Cataracts, bilateral   . Chickenpox cataracts  . COPD (chronic obstructive pulmonary disease) ([place])   . Depression   . Drug addiction ([place])   . Headache   . Hepatitis C virus infection   . Hepatitis C without hepatic coma   . History of chicken pox   . History of colon polyps   . Seizures ([place])       Objective/Physical Exam Neurovascular status intact.  Skin incisions appear to be well coapted with sutures and staples intact. No sign of infectious process noted. No dehiscence. No active bleeding noted. Moderate edema noted to the surgical extremity.  Radiographic Exam:  Orthopedic hardware, percutaneous K wires and osteotomies sites appear to be stable with routine healing.  Toes appear to be in good rectus alignment  Assessment: 1. s/p hammertoe repair 2-5 right foot. DOS: 05/24/2020   Plan of Care:  1. Patient was evaluated. X-rays reviewed 2.  Dressings changed today.  Keep clean dry and intact x1 week 3.  Prescription for doxycycline 100 mg 2 times daily #20 for prophylaxis 4.  Continue minimal weightbearing in the cam boot 5.  Return to clinic in 1 week   Edrick Kins, DPM [facility]  Dr. Edrick Kins, DPM    [address], [place] [address]                Office [phone]  Fax [phone]

## 2020-06-05 ENCOUNTER — Other Ambulatory Visit: Payer: Self-pay

## 2020-06-05 ENCOUNTER — Ambulatory Visit (INDEPENDENT_AMBULATORY_CARE_PROVIDER_SITE_OTHER): Payer: BC Managed Care – PPO | Admitting: Podiatry

## 2020-06-05 DIAGNOSIS — Z9889 Other specified postprocedural states: Secondary | ICD-10-CM

## 2020-06-05 DIAGNOSIS — M2041 Other hammer toe(s) (acquired), right foot: Secondary | ICD-10-CM

## 2020-06-05 NOTE — Progress Notes (Signed)
   Subjective:  Patient presents today status post hammertoe repair 2-5 right foot. DOS: 05/24/2020.  Patient also has history of bunionectomy surgery with hammertoe repair of the second digit right foot.  DOS: 06/24/2018.  Patient states that she is doing well.  She is wearing the cam boot as instructed.  No new complaints at this time.  Past Medical History:  Diagnosis Date  . Cataracts, bilateral   . Chickenpox cataracts  . COPD (chronic obstructive pulmonary disease) ([place])   . Depression   . Drug addiction ([place])   . Headache   . Hepatitis C virus infection   . Hepatitis C without hepatic coma   . History of chicken pox   . History of colon polyps   . Seizures ([place])       Objective/Physical Exam Neurovascular status intact.  Skin incisions appear to be well coapted with sutures and staples intact. No sign of infectious process noted.  There is some diffuse mild erythema noted to the toes 2-5 of the surgical foot.  No dehiscence. No active bleeding noted. Moderate edema noted to the surgical extremity.  Assessment: 1. s/p hammertoe repair 2-5 right foot. DOS: 05/24/2020   Plan of Care:  1. Patient was evaluated. X-rays reviewed 2.  Dressings changed today.  Keep clean dry and intact x1 week 3.  Continue oral doxycycline 100 mg 2 times daily as prescribed  4.  Continue minimal weightbearing in the cam boot 5.  Return to clinic in 1 week   Edrick Kins, DPM [facility]  Dr. Edrick Kins, DPM    [address], [place] [address]                Office [phone]  Fax [phone]

## 2020-06-07 ENCOUNTER — Telehealth: Payer: Self-pay | Admitting: Podiatry

## 2020-06-07 NOTE — Telephone Encounter (Signed)
Pt is requesting pain meds 

## 2020-06-08 ENCOUNTER — Telehealth: Payer: Self-pay | Admitting: Podiatry

## 2020-06-08 NOTE — Telephone Encounter (Signed)
Patient has requested refill for pain meds, please advise

## 2020-06-09 ENCOUNTER — Other Ambulatory Visit: Payer: Self-pay | Admitting: Podiatry

## 2020-06-09 MED ORDER — OXYCODONE-ACETAMINOPHEN 5-325 MG PO TABS: 1.0000 | ORAL_TABLET | ORAL | 0 refills | Status: DC | PRN

## 2020-06-09 NOTE — Telephone Encounter (Signed)
Rx sent 

## 2020-06-09 NOTE — Progress Notes (Signed)
PRN postop 

## 2020-06-09 NOTE — Telephone Encounter (Signed)
Rx sent. - Dr. Sevastian Witczak

## 2020-06-12 ENCOUNTER — Other Ambulatory Visit: Payer: Self-pay

## 2020-06-12 ENCOUNTER — Ambulatory Visit: Payer: BC Managed Care – PPO

## 2020-06-12 ENCOUNTER — Ambulatory Visit (INDEPENDENT_AMBULATORY_CARE_PROVIDER_SITE_OTHER): Payer: BC Managed Care – PPO

## 2020-06-12 ENCOUNTER — Ambulatory Visit (INDEPENDENT_AMBULATORY_CARE_PROVIDER_SITE_OTHER): Payer: BC Managed Care – PPO | Admitting: Podiatry

## 2020-06-12 DIAGNOSIS — M2041 Other hammer toe(s) (acquired), right foot: Secondary | ICD-10-CM | POA: Diagnosis not present

## 2020-06-12 DIAGNOSIS — Z9889 Other specified postprocedural states: Secondary | ICD-10-CM

## 2020-06-12 DIAGNOSIS — M2011 Hallux valgus (acquired), right foot: Secondary | ICD-10-CM

## 2020-06-12 NOTE — Progress Notes (Signed)
   Subjective:  Patient presents today status post hammertoe repair 2-5 right foot. DOS: 05/24/2020.  Patient also has history of bunionectomy surgery with hammertoe repair of the second digit right foot.  DOS: 06/24/2018.  Patient states that she is doing well.  She is wearing the cam boot as instructed.  No new complaints at this time.  Past Medical History:  Diagnosis Date  . Cataracts, bilateral   . Chickenpox cataracts  . COPD (chronic obstructive pulmonary disease) ([place])   . Depression   . Drug addiction ([place])   . Headache   . Hepatitis C virus infection   . Hepatitis C without hepatic coma   . History of chicken pox   . History of colon polyps   . Seizures ([place])       Objective/Physical Exam Neurovascular status intact.  Skin incisions appear to be well coapted with sutures and staples intact. No sign of infectious process noted.  There is some diffuse mild erythema noted to the toes 2-5 of the surgical foot.  No dehiscence. No active bleeding noted. Moderate edema noted to the surgical extremity.  Assessment: 1. s/p hammertoe repair 2-5 right foot. DOS: 05/24/2020   Plan of Care:  1. Patient was evaluated. X-rays reviewed 2.  Staples and sutures removed today. 3.  Dry sterile dressing applied 4.  Continue weightbearing in the cam boot 5.  Return to clinic in 10 days for percutaneous fixation pin removal   Edrick Kins, DPM [facility]  Dr. Edrick Kins, DPM    [address], [place] [address]                Office [phone]  Fax [phone]

## 2020-06-19 ENCOUNTER — Encounter: Payer: Medicaid Other | Admitting: Podiatry

## 2020-06-22 ENCOUNTER — Ambulatory Visit (INDEPENDENT_AMBULATORY_CARE_PROVIDER_SITE_OTHER): Payer: BC Managed Care – PPO

## 2020-06-22 ENCOUNTER — Ambulatory Visit (INDEPENDENT_AMBULATORY_CARE_PROVIDER_SITE_OTHER): Payer: BC Managed Care – PPO | Admitting: Podiatry

## 2020-06-22 ENCOUNTER — Other Ambulatory Visit: Payer: Self-pay

## 2020-06-22 ENCOUNTER — Encounter: Payer: Self-pay | Admitting: Podiatry

## 2020-06-22 DIAGNOSIS — M2041 Other hammer toe(s) (acquired), right foot: Secondary | ICD-10-CM

## 2020-06-22 DIAGNOSIS — Z9889 Other specified postprocedural states: Secondary | ICD-10-CM

## 2020-06-22 NOTE — Progress Notes (Signed)
   Subjective:  Patient presents today status post hammertoe repair 2-5 right foot. DOS: 05/24/2020.  Patient also has history of bunionectomy surgery with hammertoe repair of the second digit right foot.  DOS: 06/24/2018.  Patient states that she is doing well.  She is wearing the cam boot as instructed.  No new complaints at this time.  Past Medical History:  Diagnosis Date  . Cataracts, bilateral   . Chickenpox cataracts  . COPD (chronic obstructive pulmonary disease) ([place])   . Depression   . Drug addiction ([place])   . Headache   . Hepatitis C virus infection   . Hepatitis C without hepatic coma   . History of chicken pox   . History of colon polyps   . Seizures ([place])       Objective/Physical Exam Neurovascular status intact.  Skin incisions appear to be well coapted and healed. No sign of infectious process noted.  There is some diffuse mild erythema noted to the toes 2-5 of the surgical foot.  No dehiscence. No active bleeding noted. Minimal edema noted to the surgical extremity.  Radiographic Exam Removal of temporary percutaneous fixation pins noted. Toes in rectus alignment. Routine healing noted.   Assessment: 1. s/p hammertoe repair 2-5 right foot. DOS: 05/24/2020   Plan of Care:  1. Patient was evaluated. Percutaneous pins removed. X-rays reviewed 2.  Postop shoe dispensed. WB as tolerated. Discontinue CAM boot.  3. RTC 4 weeks  Edrick Kins, DPM [facility]  Dr. Edrick Kins, DPM    [address], [place] [address]                Office [phone]  Fax [phone]

## 2020-07-31 ENCOUNTER — Encounter: Payer: BC Managed Care – PPO | Admitting: Podiatry

## 2020-09-28 ENCOUNTER — Emergency Department: Payer: BC Managed Care – PPO

## 2020-09-28 ENCOUNTER — Observation Stay: Payer: BC Managed Care – PPO

## 2020-09-28 ENCOUNTER — Inpatient Hospital Stay
Admission: EM | Admit: 2020-09-28 | Discharge: 2020-09-30 | DRG: 070 | Disposition: A | Discharge status: Other Acute Inpatient Hospital | Payer: BC Managed Care – PPO | Attending: Pulmonary Disease | Admitting: Pulmonary Disease

## 2020-09-28 ENCOUNTER — Other Ambulatory Visit: Payer: Self-pay

## 2020-09-28 DIAGNOSIS — Y92009 Unspecified place in unspecified non-institutional (private) residence as the place of occurrence of the external cause: Secondary | ICD-10-CM

## 2020-09-28 DIAGNOSIS — Z881 Allergy status to other antibiotic agents status: Secondary | ICD-10-CM

## 2020-09-28 DIAGNOSIS — J9690 Respiratory failure, unspecified, unspecified whether with hypoxia or hypercapnia: Secondary | ICD-10-CM

## 2020-09-28 DIAGNOSIS — I130 Hypertensive heart and chronic kidney disease with heart failure and stage 1 through stage 4 chronic kidney disease, or unspecified chronic kidney disease: Secondary | ICD-10-CM | POA: Diagnosis present

## 2020-09-28 DIAGNOSIS — F1190 Opioid use, unspecified, uncomplicated: Secondary | ICD-10-CM | POA: Diagnosis present

## 2020-09-28 DIAGNOSIS — Z0189 Encounter for other specified special examinations: Secondary | ICD-10-CM

## 2020-09-28 DIAGNOSIS — G9341 Metabolic encephalopathy: Principal | ICD-10-CM | POA: Diagnosis present

## 2020-09-28 DIAGNOSIS — W19XXXA Unspecified fall, initial encounter: Secondary | ICD-10-CM | POA: Diagnosis present

## 2020-09-28 DIAGNOSIS — Z880 Allergy status to penicillin: Secondary | ICD-10-CM

## 2020-09-28 DIAGNOSIS — I252 Old myocardial infarction: Secondary | ICD-10-CM

## 2020-09-28 DIAGNOSIS — J9601 Acute respiratory failure with hypoxia: Secondary | ICD-10-CM

## 2020-09-28 DIAGNOSIS — F17210 Nicotine dependence, cigarettes, uncomplicated: Secondary | ICD-10-CM | POA: Diagnosis present

## 2020-09-28 DIAGNOSIS — R739 Hyperglycemia, unspecified: Secondary | ICD-10-CM | POA: Diagnosis not present

## 2020-09-28 DIAGNOSIS — N1832 Chronic kidney disease, stage 3b: Secondary | ICD-10-CM | POA: Diagnosis present

## 2020-09-28 DIAGNOSIS — G9340 Encephalopathy, unspecified: Secondary | ICD-10-CM | POA: Diagnosis present

## 2020-09-28 DIAGNOSIS — R9431 Abnormal electrocardiogram [ECG] [EKG]: Secondary | ICD-10-CM | POA: Diagnosis present

## 2020-09-28 DIAGNOSIS — Z20822 Contact with and (suspected) exposure to covid-19: Secondary | ICD-10-CM | POA: Diagnosis present

## 2020-09-28 DIAGNOSIS — L8990 Pressure ulcer of unspecified site, unspecified stage: Secondary | ICD-10-CM | POA: Insufficient documentation

## 2020-09-28 DIAGNOSIS — Z452 Encounter for adjustment and management of vascular access device: Secondary | ICD-10-CM

## 2020-09-28 DIAGNOSIS — I469 Cardiac arrest, cause unspecified: Secondary | ICD-10-CM | POA: Diagnosis not present

## 2020-09-28 DIAGNOSIS — Z803 Family history of malignant neoplasm of breast: Secondary | ICD-10-CM

## 2020-09-28 DIAGNOSIS — G40901 Epilepsy, unspecified, not intractable, with status epilepticus: Secondary | ICD-10-CM | POA: Diagnosis not present

## 2020-09-28 DIAGNOSIS — Z8673 Personal history of transient ischemic attack (TIA), and cerebral infarction without residual deficits: Secondary | ICD-10-CM

## 2020-09-28 DIAGNOSIS — R4182 Altered mental status, unspecified: Secondary | ICD-10-CM | POA: Diagnosis not present

## 2020-09-28 DIAGNOSIS — Z888 Allergy status to other drugs, medicaments and biological substances status: Secondary | ICD-10-CM

## 2020-09-28 DIAGNOSIS — B1920 Unspecified viral hepatitis C without hepatic coma: Secondary | ICD-10-CM | POA: Diagnosis present

## 2020-09-28 DIAGNOSIS — Z781 Physical restraint status: Secondary | ICD-10-CM

## 2020-09-28 DIAGNOSIS — R54 Age-related physical debility: Secondary | ICD-10-CM | POA: Diagnosis present

## 2020-09-28 DIAGNOSIS — I5022 Chronic systolic (congestive) heart failure: Secondary | ICD-10-CM | POA: Diagnosis present

## 2020-09-28 DIAGNOSIS — G40401 Other generalized epilepsy and epileptic syndromes, not intractable, with status epilepticus: Secondary | ICD-10-CM | POA: Diagnosis present

## 2020-09-28 DIAGNOSIS — N1831 Chronic kidney disease, stage 3a: Secondary | ICD-10-CM

## 2020-09-28 DIAGNOSIS — Z79899 Other long term (current) drug therapy: Secondary | ICD-10-CM

## 2020-09-28 DIAGNOSIS — Z882 Allergy status to sulfonamides status: Secondary | ICD-10-CM

## 2020-09-28 DIAGNOSIS — J449 Chronic obstructive pulmonary disease, unspecified: Secondary | ICD-10-CM | POA: Diagnosis present

## 2020-09-28 DIAGNOSIS — Z9981 Dependence on supplemental oxygen: Secondary | ICD-10-CM

## 2020-09-28 DIAGNOSIS — R4701 Aphasia: Secondary | ICD-10-CM | POA: Diagnosis present

## 2020-09-28 DIAGNOSIS — Z9049 Acquired absence of other specified parts of digestive tract: Secondary | ICD-10-CM

## 2020-09-28 LAB — COMPREHENSIVE METABOLIC PANEL
ALT: 14 U/L (ref 0–44)
ALT: 14 U/L (ref 0–44)
AST: 21 U/L (ref 15–41)
AST: 22 U/L (ref 15–41)
Albumin: 4.4 g/dL (ref 3.5–5.0)
Albumin: 4.2 g/dL (ref 3.5–5.0)
Alkaline Phosphatase: 105 U/L (ref 38–126)
Alkaline Phosphatase: 99 U/L (ref 38–126)
Anion gap: 9 (ref 5–15)
Anion gap: 10 (ref 5–15)
BUN: 11 mg/dL (ref 8–23)
BUN: 11 mg/dL (ref 8–23)
CO2: 28 mmol/L (ref 22–32)
CO2: 27 mmol/L (ref 22–32)
Calcium: 9.4 mg/dL (ref 8.9–10.3)
Calcium: 9.2 mg/dL (ref 8.9–10.3)
Chloride: 100 mmol/L (ref 98–111)
Chloride: 100 mmol/L (ref 98–111)
Creatinine, Ser: 1.24 mg/dL — ABNORMAL HIGH (ref 0.44–1.00)
Creatinine, Ser: 1.12 mg/dL — ABNORMAL HIGH (ref 0.44–1.00)
GFR, Estimated: 49 mL/min — ABNORMAL LOW (ref >60)
GFR, Estimated: 55 mL/min — ABNORMAL LOW (ref >60)
Glucose, Bld: 122 mg/dL — ABNORMAL HIGH (ref 70–99)
Glucose, Bld: 114 mg/dL — ABNORMAL HIGH (ref 70–99)
Potassium: 3.6 mmol/L (ref 3.5–5.1)
Potassium: 3.7 mmol/L (ref 3.5–5.1)
Sodium: 137 mmol/L (ref 135–145)
Sodium: 137 mmol/L (ref 135–145)
Total Bilirubin: 1.4 mg/dL — ABNORMAL HIGH (ref 0.3–1.2)
Total Bilirubin: 1.3 mg/dL — ABNORMAL HIGH (ref 0.3–1.2)
Total Protein: 7.4 g/dL (ref 6.5–8.1)
Total Protein: 7.3 g/dL (ref 6.5–8.1)

## 2020-09-28 LAB — URINALYSIS, COMPLETE (UACMP) WITH MICROSCOPIC
Bacteria, UA: NONE SEEN
Bilirubin Urine: NEGATIVE
Glucose, UA: NEGATIVE mg/dL
Ketones, ur: NEGATIVE mg/dL
Leukocytes,Ua: NEGATIVE
Nitrite: NEGATIVE
Protein, ur: NEGATIVE mg/dL
Specific Gravity, Urine: 1.008 (ref 1.005–1.030)
Squamous Epithelial / LPF: NONE SEEN (ref 0–5)
pH: 7 (ref 5.0–8.0)

## 2020-09-28 LAB — URINE DRUG SCREEN, QUALITATIVE (ARMC ONLY)
Amphetamines, Ur Screen: NOT DETECTED
Barbiturates, Ur Screen: NOT DETECTED
Benzodiazepine, Ur Scrn: POSITIVE — AB
Cannabinoid 50 Ng, Ur ~~LOC~~: NOT DETECTED
Cocaine Metabolite,Ur ~~LOC~~: NOT DETECTED
MDMA (Ecstasy)Ur Screen: NOT DETECTED
Methadone Scn, Ur: POSITIVE — AB
Opiate, Ur Screen: NOT DETECTED
Phencyclidine (PCP) Ur S: NOT DETECTED
Tricyclic, Ur Screen: NOT DETECTED

## 2020-09-28 LAB — RESP PANEL BY RT-PCR (FLU A&B, COVID) ARPGX2
Influenza A by PCR: NEGATIVE
Influenza B by PCR: NEGATIVE
SARS Coronavirus 2 by RT PCR: NEGATIVE

## 2020-09-28 LAB — CBC
HCT: 41.8 % (ref 36.0–46.0)
Hemoglobin: 13.3 g/dL (ref 12.0–15.0)
MCH: 29.8 pg (ref 26.0–34.0)
MCHC: 31.8 g/dL (ref 30.0–36.0)
MCV: 93.7 fL (ref 80.0–100.0)
Platelets: 171 10*3/uL (ref 150–400)
RBC: 4.46 MIL/uL (ref 3.87–5.11)
RDW: 14.1 % (ref 11.5–15.5)
WBC: 5 10*3/uL (ref 4.0–10.5)
nRBC: 0 % (ref 0.0–0.2)

## 2020-09-28 LAB — TROPONIN I (HIGH SENSITIVITY): Troponin I (High Sensitivity): 5 ng/L (ref <18)

## 2020-09-28 LAB — AMMONIA: Ammonia: 17 umol/L (ref 9–35)

## 2020-09-28 LAB — ACETAMINOPHEN LEVEL: Acetaminophen (Tylenol), Serum: <10 ug/mL — ABNORMAL LOW (ref 10–30)

## 2020-09-28 LAB — PROCALCITONIN: Procalcitonin: <0.1 ng/mL

## 2020-09-28 LAB — ETHANOL: Alcohol, Ethyl (B): <10 mg/dL (ref <10)

## 2020-09-28 LAB — CK: Total CK: 215 U/L (ref 38–234)

## 2020-09-28 LAB — GLUCOSE, CAPILLARY
Glucose-Capillary: 286 mg/dL — ABNORMAL HIGH (ref 70–99)
Glucose-Capillary: 247 mg/dL — ABNORMAL HIGH (ref 70–99)

## 2020-09-28 LAB — SALICYLATE LEVEL: Salicylate Lvl: <7 mg/dL — ABNORMAL LOW (ref 7.0–30.0)

## 2020-09-28 LAB — MAGNESIUM: Magnesium: 2 mg/dL (ref 1.7–2.4)

## 2020-09-28 MED ORDER — LORAZEPAM 2 MG/ML IJ SOLN
1.0000 mg | Freq: Once | INTRAMUSCULAR | Status: AC
  Administered 2020-09-28: 1 mg via INTRAVENOUS

## 2020-09-28 MED ORDER — CHLORHEXIDINE GLUCONATE 0.12% ORAL RINSE (MEDLINE KIT)
15.0000 mL | Freq: Two times a day (BID) | OROMUCOSAL | Status: DC
  Administered 2020-09-28 – 2020-09-30 (×3): 15 mL via OROMUCOSAL

## 2020-09-28 MED ORDER — FAMOTIDINE IN NACL 20-0.9 MG/50ML-% IV SOLN
20.0000 mg | Freq: Two times a day (BID) | INTRAVENOUS | Status: DC
  Administered 2020-09-28 – 2020-09-30 (×4): 20 mg via INTRAVENOUS
  Filled 2020-09-28 (×4): qty 50

## 2020-09-28 MED ORDER — LORAZEPAM 2 MG/ML IJ SOLN
INTRAMUSCULAR | Status: AC
  Filled 2020-09-28: qty 1

## 2020-09-28 MED ORDER — KETAMINE HCL 10 MG/ML IJ SOLN
0.3000 mg/kg | Freq: Once | INTRAMUSCULAR | Status: DC
  Filled 2020-09-28: qty 1

## 2020-09-28 MED ORDER — SODIUM CHLORIDE 0.9 % IV SOLN
2.0000 g | Freq: Two times a day (BID) | INTRAVENOUS | Status: DC
  Administered 2020-09-29 – 2020-09-30 (×4): 2 g via INTRAVENOUS
  Filled 2020-09-28 (×6): qty 2

## 2020-09-28 MED ORDER — LORAZEPAM 2 MG/ML IJ SOLN
INTRAMUSCULAR | Status: AC
  Administered 2020-09-28: 2 mg
  Filled 2020-09-28: qty 1

## 2020-09-28 MED ORDER — INSULIN ASPART 100 UNIT/ML ~~LOC~~ SOLN
0.0000 [IU] | SUBCUTANEOUS | Status: DC
  Administered 2020-09-29 – 2020-09-30 (×4): 2 [IU] via SUBCUTANEOUS
  Filled 2020-09-28 (×4): qty 1

## 2020-09-28 MED ORDER — ORAL CARE MOUTH RINSE
15.0000 mL | OROMUCOSAL | Status: DC
  Administered 2020-09-28 – 2020-09-30 (×18): 15 mL via OROMUCOSAL

## 2020-09-28 MED ORDER — ONDANSETRON HCL 4 MG/2ML IJ SOLN: 4.0000 mg | Freq: Four times a day (QID) | INTRAMUSCULAR | Status: DC | PRN

## 2020-09-28 MED ORDER — SODIUM CHLORIDE 0.9 % IV SOLN
750.0000 mg | Freq: Two times a day (BID) | INTRAVENOUS | Status: DC
  Administered 2020-09-29 – 2020-09-30 (×3): 750 mg via INTRAVENOUS
  Filled 2020-09-28 (×6): qty 7.5

## 2020-09-28 MED ORDER — SODIUM CHLORIDE 0.9% FLUSH
3.0000 mL | Freq: Two times a day (BID) | INTRAVENOUS | Status: DC
  Administered 2020-09-28 – 2020-09-30 (×4): 3 mL via INTRAVENOUS

## 2020-09-28 MED ORDER — PROPOFOL 1000 MG/100ML IV EMUL
5.0000 ug/kg/min | INTRAVENOUS | Status: DC
  Administered 2020-09-28: 5 ug/kg/min via INTRAVENOUS
  Administered 2020-09-29: 30 ug/kg/min via INTRAVENOUS
  Administered 2020-09-29: 25 ug/kg/min via INTRAVENOUS
  Administered 2020-09-29: 30 ug/kg/min via INTRAVENOUS
  Administered 2020-09-30: 50 ug/kg/min via INTRAVENOUS
  Administered 2020-09-30: 20 ug/kg/min via INTRAVENOUS
  Filled 2020-09-28 (×5): qty 100

## 2020-09-28 MED ORDER — LORAZEPAM 2 MG/ML IJ SOLN
1.0000 mg | Freq: Once | INTRAMUSCULAR | Status: AC
  Administered 2020-09-28: 1 mg via INTRAVENOUS
  Filled 2020-09-28: qty 1

## 2020-09-28 MED ORDER — ONDANSETRON HCL 4 MG PO TABS: 4.0000 mg | ORAL_TABLET | Freq: Four times a day (QID) | ORAL | Status: DC | PRN

## 2020-09-28 MED ORDER — HYDRALAZINE HCL 20 MG/ML IJ SOLN
20.0000 mg | Freq: Four times a day (QID) | INTRAMUSCULAR | Status: DC | PRN
  Administered 2020-09-28: 20 mg via INTRAVENOUS
  Filled 2020-09-28: qty 1

## 2020-09-28 MED ORDER — NALOXONE HCL 2 MG/2ML IJ SOSY
0.4000 mg | PREFILLED_SYRINGE | INTRAMUSCULAR | Status: DC | PRN
  Filled 2020-09-28: qty 2

## 2020-09-28 MED ORDER — IPRATROPIUM-ALBUTEROL 0.5-2.5 (3) MG/3ML IN SOLN: 3.0000 mL | Freq: Four times a day (QID) | RESPIRATORY_TRACT | Status: DC | PRN

## 2020-09-28 MED ORDER — MIDAZOLAM HCL 2 MG/2ML IJ SOLN: 4.0000 mg | Freq: Once | INTRAMUSCULAR | Status: AC

## 2020-09-28 MED ORDER — HALOPERIDOL LACTATE 5 MG/ML IJ SOLN
INTRAMUSCULAR | Status: AC
  Administered 2020-09-28: 5 mg via INTRAMUSCULAR
  Filled 2020-09-28: qty 1

## 2020-09-28 MED ORDER — NOREPINEPHRINE 4 MG/250ML-% IV SOLN
INTRAVENOUS | Status: AC
  Administered 2020-09-28: 2 ug/min via INTRAVENOUS
  Filled 2020-09-28: qty 250

## 2020-09-28 MED ORDER — DIPHENHYDRAMINE HCL 50 MG/ML IJ SOLN
25.0000 mg | Freq: Once | INTRAMUSCULAR | Status: AC
  Administered 2020-09-28: 25 mg via INTRAVENOUS

## 2020-09-28 MED ORDER — FENTANYL BOLUS VIA INFUSION
50.0000 ug | INTRAVENOUS | Status: DC | PRN
Start: 2020-09-28 — End: 2020-09-28
  Filled 2020-09-28: qty 100

## 2020-09-28 MED ORDER — NOREPINEPHRINE 4 MG/250ML-% IV SOLN
0.0000 ug/min | INTRAVENOUS | Status: DC
  Administered 2020-09-29: 5 ug/min via INTRAVENOUS
  Filled 2020-09-28: qty 250

## 2020-09-28 MED ORDER — METRONIDAZOLE IN NACL 5-0.79 MG/ML-% IV SOLN
500.0000 mg | Freq: Three times a day (TID) | INTRAVENOUS | Status: DC
  Administered 2020-09-29 – 2020-09-30 (×5): 500 mg via INTRAVENOUS
  Filled 2020-09-28 (×7): qty 100

## 2020-09-28 MED ORDER — ACETAMINOPHEN 650 MG RE SUPP: 650.0000 mg | Freq: Four times a day (QID) | RECTAL | Status: DC | PRN

## 2020-09-28 MED ORDER — BISACODYL 5 MG PO TBEC: 5.0000 mg | DELAYED_RELEASE_TABLET | Freq: Every day | ORAL | Status: DC | PRN

## 2020-09-28 MED ORDER — DOCUSATE SODIUM 50 MG/5ML PO LIQD
100.0000 mg | Freq: Two times a day (BID) | ORAL | Status: DC
  Administered 2020-09-29 (×3): 100 mg
  Filled 2020-09-28 (×3): qty 10

## 2020-09-28 MED ORDER — FENTANYL CITRATE (PF) 100 MCG/2ML IJ SOLN
INTRAMUSCULAR | Status: AC
  Administered 2020-09-28: 100 ug via INTRAVENOUS
  Filled 2020-09-28: qty 2

## 2020-09-28 MED ORDER — FENTANYL CITRATE (PF) 100 MCG/2ML IJ SOLN: 100.0000 ug | Freq: Once | INTRAMUSCULAR | Status: AC

## 2020-09-28 MED ORDER — HALOPERIDOL LACTATE 5 MG/ML IJ SOLN: 5.0000 mg | Freq: Once | INTRAMUSCULAR | Status: AC

## 2020-09-28 MED ORDER — ACETAMINOPHEN 325 MG PO TABS: 650.0000 mg | ORAL_TABLET | Freq: Four times a day (QID) | ORAL | Status: DC | PRN

## 2020-09-28 MED ORDER — MIDAZOLAM HCL 2 MG/2ML IJ SOLN
INTRAMUSCULAR | Status: AC
  Administered 2020-09-28: 4 mg via INTRAVENOUS
  Filled 2020-09-28: qty 4

## 2020-09-28 MED ORDER — FENTANYL CITRATE (PF) 100 MCG/2ML IJ SOLN: 50.0000 ug | INTRAMUSCULAR | Status: DC | PRN

## 2020-09-28 MED ORDER — SODIUM CHLORIDE 0.9 % IV SOLN
2000.0000 mg | Freq: Once | INTRAVENOUS | Status: AC
  Administered 2020-09-28: 2000 mg via INTRAVENOUS
  Filled 2020-09-28: qty 20

## 2020-09-28 MED ORDER — MIDAZOLAM HCL 2 MG/2ML IJ SOLN
2.0000 mg | INTRAMUSCULAR | Status: DC | PRN
  Administered 2020-09-29: 2 mg via INTRAVENOUS
  Filled 2020-09-28: qty 2

## 2020-09-28 MED ORDER — POLYETHYLENE GLYCOL 3350 17 G PO PACK
17.0000 g | PACK | Freq: Every day | ORAL | Status: DC
  Administered 2020-09-29: 17 g
  Filled 2020-09-28: qty 1

## 2020-09-28 MED ORDER — MORPHINE SULFATE (PF) 2 MG/ML IV SOLN
INTRAVENOUS | Status: AC
  Administered 2020-09-28: 2 mg via INTRAMUSCULAR
  Filled 2020-09-28: qty 1

## 2020-09-28 MED ORDER — HALOPERIDOL LACTATE 5 MG/ML IJ SOLN: 5.0000 mg | Freq: Three times a day (TID) | INTRAMUSCULAR | Status: DC | PRN

## 2020-09-28 MED ORDER — FENTANYL CITRATE (PF) 100 MCG/2ML IJ SOLN
50.0000 ug | Freq: Once | INTRAMUSCULAR | Status: DC
Start: 2020-09-28 — End: 2020-09-29

## 2020-09-28 MED ORDER — MIDAZOLAM HCL 2 MG/2ML IJ SOLN: 2.0000 mg | INTRAMUSCULAR | Status: DC | PRN

## 2020-09-28 MED ORDER — DIPHENHYDRAMINE HCL 50 MG/ML IJ SOLN
25.0000 mg | Freq: Once | INTRAMUSCULAR | Status: AC
  Administered 2020-09-28: 25 mg via INTRAVENOUS
  Filled 2020-09-28: qty 1

## 2020-09-28 MED ORDER — METOPROLOL TARTRATE 5 MG/5ML IV SOLN
INTRAVENOUS | Status: AC
  Administered 2020-09-28: 5 mg
  Filled 2020-09-28: qty 5

## 2020-09-28 MED ORDER — FENTANYL 2500MCG IN NS 250ML (10MCG/ML) PREMIX INFUSION
50.0000 ug/h | INTRAVENOUS | Status: DC
  Filled 2020-09-28: qty 250

## 2020-09-28 NOTE — ED Notes (Signed)
Dr Jari Pigg notified pt uncooperative and becoming combative with staff. 5mg  haldol IM ordered

## 2020-09-28 NOTE — ED Notes (Signed)
Unable to keep monitors on pt d/t being combative and pulling monitors off

## 2020-09-28 NOTE — ED Notes (Signed)
Pt resting at this time.

## 2020-09-28 NOTE — Progress Notes (Signed)
Chaplain communicated with son, Natalie [name] of [place] status and change of room to ICU. Natalie [name] and his brother are [name]'s next of kin. Her significant other died recently. Natalie [name] is enroute and plans to arrive approximately 930pm.

## 2020-09-28 NOTE — ED Notes (Signed)
Sister at bedside.

## 2020-09-28 NOTE — Procedures (Signed)
Central Venous Catheter Insertion Procedure Note  SHANNIA JACUINDE  [id number]  [birth year]  Date:09/28/20  Time:9:44 PM   Provider Performing:Merritt Kibby L [place]   Procedure: Insertion of Non-tunneled Central Venous Catheter(36556) with US guidance (03014)   Indication(s) Medication administration and Difficult access  Consent Unable to obtain consent due to emergent nature of procedure.  Anesthesia Topical only with 1% lidocaine   Timeout Verified patient identification, verified procedure, site/side was marked, verified correct patient position, special equipment/implants available, medications/allergies/relevant history reviewed, required imaging and test results available.  Sterile Technique Maximal sterile technique including full sterile barrier drape, hand hygiene, sterile gown, sterile gloves, mask, hair covering, sterile ultrasound probe cover (if used).  Procedure Description Area of catheter insertion was cleaned with chlorhexidine and draped in sterile fashion.  With real-time ultrasound guidance a central venous catheter was placed into the left internal jugular vein. Nonpulsatile blood flow and easy flushing noted in all ports.  The catheter was sutured in place and sterile dressing applied.  Complications/Tolerance None; patient tolerated the procedure well. Chest X-ray is ordered to verify placement for internal jugular or subclavian cannulation.   Chest x-ray is not ordered for femoral cannulation.  EBL Minimal  Specimen(s) None  Venetia Night, AGACNP-BC Acute Care Nurse Practitioner [facility]   [phone] / [phone] Please see Amion for pager details.

## 2020-09-28 NOTE — ED Provider Notes (Signed)
-----------------------------------------   4:54 PM on 09/28/2020 -----------------------------------------  Patient care assumed from Dr. Nickolas Madrid.  Patient CT shows an old infarct but no acute abnormality.  Sister is here who states this is very atypical of the patient.  The sister suspected possible substance abuse however the urine drug screen is positive for benzodiazepines which we gave the patient in methadone which the patient is on chronically.  Given the unknown cause of the altered mental state we will admit to the hospital service for acute delirium.  I have ordered an MRI while the patient is still somnolent/sedated to further evaluate for acute CVA.  Covid test is pending.   Harvest Dark, MD 09/28/20 [phone]

## 2020-09-28 NOTE — Progress Notes (Addendum)
a 

## 2020-09-28 NOTE — H&P (Signed)
History and Physical    Natalie [name] CVE:[id number] DOB: 1955/10/08 DOA: 09/28/2020  PCP: Henrietta Hoover, MD  Patient coming from: home    Chief Complaint: AMS  HPI: 65 y/o F w/ PMH of methadone use, previous drug use, COPD on oxygen (unknown amount), CKD, seizures, migraines who presents w/ altered mental status x day of admission. All of hx was obtained pt's son and sister via phone. The last time that the pt's son talked to the pt was on 09/26/20 and pt was at baseline and did not complain of anything. Of note, pt's significant other of 22 years passed away on September 04, 2020 and since that time pt has been intermittently falling. Pt has fallen within the last month hit her head but it unknown if pt had LOC or not. At that time, pt did not seek medical attention after falling. Also, pt has hx of drug use but family is unaware of the drugs that pt previously used. Unable to complete a review of systems as pt has AMS and pt's family are unsure   Review of Systems: A 14 point of ROS could not be completed secondary to pt's AMS   Past Medical History:  Diagnosis Date  . Cataracts, bilateral   . Chickenpox cataracts  . COPD (chronic obstructive pulmonary disease) ([place])   . Depression   . Drug addiction ([place])   . Headache   . Hepatitis C virus infection   . Hepatitis C without hepatic coma   . History of chicken pox   . History of colon polyps   . Seizures ([place])     Past Surgical History:  Procedure Laterality Date  . APPENDECTOMY    . CHOLECYSTECTOMY    . COLONOSCOPY WITH PROPOFOL N/A 04/30/2015   Procedure: COLONOSCOPY WITH PROPOFOL;  Surgeon: Josefine Class, MD;  Location: [facility] ENDOSCOPY;  Service: Endoscopy;  Laterality: N/A;  . COLONOSCOPY WITH PROPOFOL N/A 07/13/2017   Procedure: COLONOSCOPY WITH PROPOFOL;  Surgeon: Manya Silvas, MD;  Location: [facility] ENDOSCOPY;  Service: Endoscopy;  Laterality: N/A;  . ESOPHAGOGASTRODUODENOSCOPY (EGD) WITH PROPOFOL N/A 07/13/2017    Procedure: ESOPHAGOGASTRODUODENOSCOPY (EGD) WITH PROPOFOL;  Surgeon: Manya Silvas, MD;  Location: [facility] ENDOSCOPY;  Service: Endoscopy;  Laterality: N/A;  . EYE SURGERY       reports that she has been smoking cigarettes. She has a 20.50 pack-year smoking history. She has never used smokeless tobacco. She reports that she does not drink alcohol and does not use drugs.  Allergies  Allergen Reactions  . Sulfasalazine Shortness Of Breath  . Aspirin   . Darvon [Propoxyphene]   . Iodine   . Penicillins   . Sulfa Antibiotics   . Zithromax [Azithromycin]     Family History  Problem Relation Age of Onset  . Breast cancer Paternal Grandmother      Prior to Admission medications   Medication Sig Start Date End Date Taking? Authorizing Provider  acetaminophen (TYLENOL) 500 MG tablet Take 500 mg by mouth every 6 (six) hours as needed.    [provider]  albuterol (PROVENTIL HFA;VENTOLIN HFA) 108 (90 BASE) MCG/ACT inhaler Inhale 2 puffs into the lungs every 6 (six) hours as needed for wheezing or shortness of breath.    [provider]  cephALEXin (KEFLEX) 500 MG capsule Take 1 capsule (500 mg total) by mouth 3 (three) times daily. 07/23/18   Edrick Kins, DPM  doxycycline (VIBRA-TABS) 100 MG tablet Take 1 tablet (100 mg total)  by mouth 2 (two) times daily. 05/29/20   Edrick Kins, DPM  gabapentin (NEURONTIN) 300 MG capsule Take 300 mg by mouth 3 (three) times daily.    [provider]  ipratropium-albuterol (DUONEB) 0.5-2.5 (3) MG/3ML SOLN Take 3 mLs by nebulization every 6 (six) hours. 07/25/17   Bettey Costa, MD  methadone (DOLOPHINE) 10 MG tablet Take 140 mg by mouth daily.     [provider]  mirtazapine (REMERON) 30 MG tablet Take 30 mg by mouth at bedtime.    [provider]  ondansetron (ZOFRAN-ODT) 4 MG disintegrating tablet Take 4 mg by mouth every 8 (eight) hours as needed for nausea or vomiting.    [provider]   oxyCODONE-acetaminophen (PERCOCET) 5-325 MG tablet Take 1 tablet by mouth every 4 (four) hours as needed for severe pain. 06/09/20   Edrick Kins, DPM  tiotropium (SPIRIVA) 18 MCG inhalation capsule Place into inhaler and inhale. 03/25/17 03/25/18  [provider]  traZODone (DESYREL) 100 MG tablet Take 100 mg by mouth at bedtime.    [provider]    Physical Exam: Vitals:   09/28/20 1341 09/28/20 1435 09/28/20 1505 09/28/20 1600  BP: (!) 179/93   (!) 193/93  Pulse: (!) 106   82  Resp: (!) 24   19  Temp:   97.8 F (36.6 C)   TempSrc:   Axillary   SpO2: 99% 96%  100%  Weight:      Height:        Constitutional: NAD, lethargic & frail appearing  Vitals:   09/28/20 1341 09/28/20 1435 09/28/20 1505 09/28/20 1600  BP: (!) 179/93   (!) 193/93  Pulse: (!) 106   82  Resp: (!) 24   19  Temp:   97.8 F (36.6 C)   TempSrc:   Axillary   SpO2: 99% 96%  100%  Weight:      Height:       Eyes: PERRL, lids and conjunctivae normal ENMT: Mucous membranes are moist.  Neck: normal, supple Respiratory: diminished breath sounds b/l. No wheezes   Cardiovascular: S1/S2+. No rubs, clicks, or gallops  Abdomen: Soft, NT,ND, hypoactive bowel sounds  Musculoskeletal: no clubbing / cyanosis. No joint deformity upper and lower extremities. Skin: no rashes, lesions Neurologic: Very lethargic. Unable to complete as pt will not follow simple commands Psychiatric: Abnormal judgment and insight.     Labs on Admission: I have personally reviewed following labs and imaging studies  CBC: Recent Labs  Lab 09/28/20 1347  WBC 5.0  HGB 13.3  HCT 41.8  MCV 93.7  PLT 683   Basic Metabolic Panel: Recent Labs  Lab 09/28/20 1347  NA 137  K 3.6  CL 100  CO2 28  GLUCOSE 122*  BUN 11  CREATININE 1.24*  CALCIUM 9.4   GFR: Estimated Creatinine Clearance: 38.4 mL/min (A) (by C-G formula based on SCr of 1.24 mg/dL (H)). Liver Function Tests: Recent Labs  Lab 09/28/20 1347   AST 21  ALT 14  ALKPHOS 105  BILITOT 1.4*  PROT 7.4  ALBUMIN 4.4   No results for input(s): LIPASE, AMYLASE in the last 168 hours. Recent Labs  Lab 09/28/20 1347  AMMONIA 17   Coagulation Profile: No results for input(s): INR, PROTIME in the last 168 hours. Cardiac Enzymes: Recent Labs  Lab 09/28/20 1347  CKTOTAL 215   BNP (last 3 results) No results for input(s): PROBNP in the last 8760 hours. HbA1C: No results for input(s): HGBA1C  in the last 72 hours. CBG: No results for input(s): GLUCAP in the last 168 hours. Lipid Profile: No results for input(s): CHOL, HDL, LDLCALC, TRIG, CHOLHDL, LDLDIRECT in the last 72 hours. Thyroid Function Tests: No results for input(s): TSH, T4TOTAL, FREET4, T3FREE, THYROIDAB in the last 72 hours. Anemia Panel: No results for input(s): VITAMINB12, FOLATE, FERRITIN, TIBC, IRON, RETICCTPCT in the last 72 hours. Urine analysis:    Component Value Date/Time   COLORURINE STRAW (A) 09/28/2020 1555   APPEARANCEUR CLEAR (A) 09/28/2020 1555   APPEARANCEUR Clear 05/01/2012 0957   LABSPEC 1.008 09/28/2020 1555   LABSPEC 1.018 05/01/2012 0957   PHURINE 7.0 09/28/2020 [place] 09/28/2020 1555   GLUCOSEU Negative 05/01/2012 0957   HGBUR SMALL (A) 09/28/2020 1555   BILIRUBINUR NEGATIVE 09/28/2020 1555   BILIRUBINUR Negative 05/01/2012 0957   KETONESUR NEGATIVE 09/28/2020 1555   PROTEINUR NEGATIVE 09/28/2020 1555   NITRITE NEGATIVE 09/28/2020 1555   LEUKOCYTESUR NEGATIVE 09/28/2020 1555   LEUKOCYTESUR Negative 05/01/2012 0957    Radiological Exams on Admission: CT Head Wo Contrast  Result Date: 09/28/2020 CLINICAL DATA:  Altered level of consciousness EXAM: CT HEAD WITHOUT CONTRAST TECHNIQUE: Contiguous axial images were obtained from the base of the skull through the vertex without intravenous contrast. COMPARISON:  05/02/2012 FINDINGS: Brain: Confluent hypodensities are again seen throughout the periventricular white matter,  compatible with chronic small vessel ischemic changes. Focal hypodensity within the left thalamus is new since prior CT, but likely reflects a chronic lacunar infarct. No other signs of acute infarct or hemorrhage. Lateral ventricles and remaining midline structures are unremarkable. No acute extra-axial fluid collections. No mass effect. Vascular: Mild atherosclerosis of the internal carotid arteries. No hyperdense vessel. Skull: Normal. Negative for fracture or focal lesion. Sinuses/Orbits: No acute finding. Other: None. IMPRESSION: 1. Focal hypodensity left thalamus consistent with chronic lacunar infarct, new since previous CT. 2. Stable chronic small-vessel ischemic changes throughout the periventricular white matter. 3. No acute infarct or hemorrhage. Electronically Signed   By: Randa Ngo M.D.   On: 09/28/2020 16:00   CT Cervical Spine Wo Contrast  Result Date: 09/28/2020 CLINICAL DATA:  Altered level of consciousness, trauma EXAM: CT CERVICAL SPINE WITHOUT CONTRAST TECHNIQUE: Multidetector CT imaging of the cervical spine was performed without intravenous contrast. Multiplanar CT image reconstructions were also generated. COMPARISON:  None. FINDINGS: Alignment: Alignment is grossly anatomic. Skull base and vertebrae: No acute fracture. No primary bone lesion or focal pathologic process. Soft tissues and spinal canal: No prevertebral fluid or swelling. No visible canal hematoma. Disc levels: Prominent spondylosis at C5-6 and C6-7. Mild facet hypertrophy from C2 through C4. Symmetrical neural foraminal encroachment at C5-6. Upper chest: Airway is patent.  Lung apices are clear. Other: Reconstructed images demonstrate no additional findings. IMPRESSION: 1. No acute cervical spine fracture. 2. Multilevel spondylosis and facet hypertrophy, most pronounced at C5-6. Electronically Signed   By: Randa Ngo M.D.   On: 09/28/2020 16:02   DG Chest Portable 1 View  Result Date: 09/28/2020 CLINICAL DATA:   Shortness of breath in a 65 year old female. EXAM: PORTABLE CHEST 1 VIEW COMPARISON:  July 24, 2017 FINDINGS: Trachea midline. Cardiomediastinal contours with increased conspicuity of the main pulmonary artery along the LEFT cardiac border. Central pulmonary vasculature is engorged. Increased interstitial markings are present. No sign of lobar consolidation or evidence of pleural effusion. On limited assessment no acute skeletal process. IMPRESSION: 1. Central pulmonary vascular congestion and probable mild interstitial edema. 2. Marked prominence of the  main pulmonary artery could be seen in the setting of pulmonary arterial hypertension. Prior imaging with signs of pulmonary arterial hypertension, this appears more prominent on today's study than on previous imaging. 3. No sign of lobar consolidation or pleural effusion. Electronically Signed   By: Zetta Bills M.D.   On: 09/28/2020 14:20    EKG: Independently reviewed.   Assessment/Plan Active Problems:   * No active hospital problems. *  Encephalopathy: etiology unclear, possibly metabolic. Urine drug screen was positive for methadone and benzos. NPO. CT brain shows chronic lacunar infract but no acute infarct or hemorrhage. MRI brain ordered. Will hold all home sedating meds while pt is encephalopathic   Chronic methadone use: hold methadone while pt is NPO. Hx of previous drug use, but family is unsure of type of previous drugs used   Elevated BP: w/o hx of HTN. IV hydralazine prn   COPD: w/o exacerbation. Chronically on oxygen but unknown amount. Continue on supplemental oxygen. Started on bronchodilators and encourage incentive spirometry   CKD: baseline Cr/GFR is unknown, currently stage IIIa. Avoid nephrotoxic meds  Hx of seizures: not on any seizures as per med rec. Will have pharmacy verify med rec  Hyperglycemia: no hx of DM. Will continue to monitor    DVT prophylaxis: SCDs Code Status: full Family Communication:  discussed pt's care w/ pt's son & sister Disposition Plan: depends on PT/OT recs (not consulted yet) Consults called: none Admission status: observation, med-surg   Wyvonnia Dusky MD [facility] Pager [phone]   If 7PM-7AM, please contact night-coverage [email or web address]   09/28/2020, 4:48 PM

## 2020-09-28 NOTE — ED Triage Notes (Signed)
Pt to ED [facility] from home for chief complaint ams. Unknown LKW. 911 called by neighbors. Pt was babysitting toddler, and was altered. EMS reports pt found running through house and had to give 2 mg versed IM given.  Pt with dried stool down legs Supposedly wears o2 at home Pt talking but unable to understand pt at this time

## 2020-09-28 NOTE — Progress Notes (Signed)
Critical  care note/CODE BLUE:  Date of note: 09/28/2020  Subjective: The patient was admitted earlier with acute metabolic encephalopathy with positive drug screen for methadone and benzodiazepines and head CT showing chronic lacunar infarct with no acute findings.  She has a history of chronic methadone use.  Her opiates were held off.  She was given IV Benadryl, fentanyl and Haldol for sedation in the ER in addition to IV Ativan.  She arrived to the floor in status epilepticus with a heart rate of 190s and blood pressure of 140-150/110-120 and respiratory rate in the 50s, gasping for air however with O2 sat of 94% on 2 L of O2 by nasal cannula.  She was given 4 mg of IV Ativan initially and later on another 2 mg.  She was given 5 mg IV Lopressor for her tachycardia.  She was also given 2 mg of IV morphine for suspected opiate withdrawal.  The patient was earlier given IV Narcan in the ER CODE BLUE was called.  Blood glucose was 232.  The patient was bagged pending intubation.  Objective: Physical examination: Generally: Acutely ill, mental obtunded elderly Caucasian female, s/p continued seizures earlier and in acute respiratory distress, not protecting her airways, being intubated by the ER physician for a portable glidoscope. Vital signs per history of present illness.  Temperature was 100 and pulse extremity was 100% on mechanical ventilation. Head - atraumatic, normocephalic.  Pupils - equal, round and reactive to light and accommodation. Extraocular movements are intact. No scleral icterus.  Oropharynx - moist mucous membranes and tongue. No pharyngeal erythema or exudate.  Neck - supple. No JVD. Carotid pulses 2+ bilaterally. No carotid bruits. No palpable thyromegaly or lymphadenopathy. Cardiovascular - regular rate and rhythm. Normal S1 and S2. No murmurs, gallops or rubs.  Lungs - clear to auscultation bilaterally.  Abdomen - soft and nontender. Positive bowel sounds. No palpable  organomegaly or masses.  Extremities - no pitting edema, clubbing or cyanosis.  Neuro - grossly non-focal.  She was having  continued seizures as mentioned above. Skin - no rashes. Breast, pelvic and rectal - deferred.  Labs and notes were reviewed.  Assessment/plan: Status epilepticus with subsequent acute hypoxic respiratory failure and acute encephalopathy requiring emergent endotracheal intubation and mechanical ventilation. -The patient was given a total of 6 mg of IV Ativan so far, 5 mg of IV metoprolol and 2 mg of IV morphine sulfate. -For RSI she received 20 mg of IV etomidate and 100 mg IV succinylcholine. -She was initially bagged then intubated via glide scope by the ER physician Dr. Kerman Passey. -She was transferred to the ICU and will be placed on mechanical ventilation. -ICU team is aware about the patient. -Tele neurology consult was requested as well. -She will need an EEG. -She was ordered IV Keppra in addition to IV Versed. -Her care will be transferred to the ICU team.  Authorized and performed by: Eugenie Norrie, MD Total critical care time: Approximately 30  minutes. Due to a high probability of clinically significant, life-threatening deterioration, the patient required my highest level of preparedness to intervene emergently and I personally spent this critical care time directly and personally managing the patient.  This critical care time included obtaining a history, examining the patient, pulse oximetry, ordering and review of studies, arranging urgent treatment with development of management plan, evaluation of patient's response to treatment, frequent reassessment, and discussions with other providers. This critical care time was performed to assess and manage the high probability of  imminent, life-threatening deterioration that could result in multiorgan failure.  It was exclusive of separately billable procedures and treating other patients and teaching  time.  Please see MDM section and the rest of the note for further information on patient assessment and treatment.

## 2020-09-28 NOTE — Progress Notes (Signed)
[facility] Progress Note Patient Name: Natalie [name] DOB: [birth year] MRN: [id number]   Date of Service  09/28/2020  HPI/Events of Note  Patient with a history of COPD, drug abuse on Methadone program who presented earlier today to the ED with altered mental status of undetermined etiology and was subsequently intubated, and is currently mechanically ventilated. Work up is in progress, including a scheduled brain MRI (CT was without acute finding).  eICU Interventions  New Patient Evaluation completed.        Frederik Pear 09/28/2020, 8:54 PM

## 2020-09-28 NOTE — ED Notes (Addendum)
Pt very restless in bed, unable to go for MRI at this time. Dr Jimmye Norman notified, this RN uncomfortable giving additional sedating medications d/t recent apneic episodes. No additional orders at this time

## 2020-09-28 NOTE — ED Notes (Signed)
Pt cleaned dry stool.  Pt appears to have word salad, talking in incomprehensible sentences

## 2020-09-28 NOTE — ED Notes (Signed)
Bruise noted right hip

## 2020-09-28 NOTE — ED Notes (Signed)
Dr Kerman Passey aware of pt having apneic episodes lasting approx 15 seconds. Maintaining spo2 99% on 2L. No new orders at this time

## 2020-09-28 NOTE — ED Notes (Signed)
Pt transported to CT, unable to get CT d/t pt continuing to move, Dr Jari Pigg aware

## 2020-09-28 NOTE — ED Notes (Signed)
Pt to XRAY with [employer]

## 2020-09-28 NOTE — ED Notes (Signed)
In and out performed with [employer]

## 2020-09-28 NOTE — ED Provider Notes (Signed)
[facility]  Department of Emergency Medicine   Code Blue CONSULT NOTE  Chief Complaint: Unresponsive  Level V Caveat: Unresponsive  History of present illness: I was contacted by the hospital for a CODE BLUE cardiac arrest upstairs and presented to the patient's bedside.  Patient recently admitted from the emergency department for altered mental status.  CODE BLUE was called once on the floor.  I responded to the CODE BLUE to find the patient actively seizing.  Patient was given 2 mg Ativan and continued to have seizure.  Apparently the seizure stopped after several minutes after I arrived.  Very altered, somnolent, unable to follow commands.  ROS: Unable to obtain, Level V caveat  Scheduled Meds: . chlorhexidine gluconate (MEDLINE KIT)  15 mL Mouth Rinse BID  . docusate  100 mg Per Tube BID  . fentaNYL (SUBLIMAZE) injection  50 mcg Intravenous Once  . LORazepam      . LORazepam      . mouth rinse  15 mL Mouth Rinse 10 times per day  . morphine      . polyethylene glycol  17 g Per Tube Daily  . sodium chloride flush  3 mL Intravenous Q12H   Continuous Infusions: . famotidine (PEPCID) IV    . levETIRAcetam 2,000 mg (09/28/20 2138)  . [START ON 09/29/2020] levETIRAcetam    . metronidazole    . propofol (DIPRIVAN) infusion     PRN Meds:.acetaminophen **OR** acetaminophen, bisacodyl, fentaNYL (SUBLIMAZE) injection, haloperidol lactate, hydrALAZINE, ipratropium-albuterol, midazolam, midazolam, naLOXone (NARCAN)  injection, ondansetron **OR** ondansetron (ZOFRAN) IV Past Medical History:  Diagnosis Date  . Cataracts, bilateral   . Chickenpox cataracts  . COPD (chronic obstructive pulmonary disease) ([place])   . Depression   . Drug addiction ([place])   . Headache   . Hepatitis C virus infection   . Hepatitis C without hepatic coma   . History of chicken pox   . History of colon polyps   . Seizures ([place])    Past Surgical History:  Procedure Laterality Date  .  APPENDECTOMY    . CHOLECYSTECTOMY    . COLONOSCOPY WITH PROPOFOL N/A 04/30/2015   Procedure: COLONOSCOPY WITH PROPOFOL;  Surgeon: Josefine Class, MD;  Location: [facility] ENDOSCOPY;  Service: Endoscopy;  Laterality: N/A;  . COLONOSCOPY WITH PROPOFOL N/A 07/13/2017   Procedure: COLONOSCOPY WITH PROPOFOL;  Surgeon: Manya Silvas, MD;  Location: [facility] ENDOSCOPY;  Service: Endoscopy;  Laterality: N/A;  . ESOPHAGOGASTRODUODENOSCOPY (EGD) WITH PROPOFOL N/A 07/13/2017   Procedure: ESOPHAGOGASTRODUODENOSCOPY (EGD) WITH PROPOFOL;  Surgeon: Manya Silvas, MD;  Location: [facility] ENDOSCOPY;  Service: Endoscopy;  Laterality: N/A;  . EYE SURGERY     Social History   Socioeconomic History  . Marital status: Married    Spouse name: Not on file  . Number of children: Not on file  . Years of education: Not on file  . Highest education level: Not on file  Occupational History  . Not on file  Tobacco Use  . Smoking status: Current Every Day Smoker    Packs/day: 0.50    Years: 41.00    Pack years: 20.50    Types: Cigarettes  . Smokeless tobacco: Never Used  Vaping Use  . Vaping Use: Never used  Substance and Sexual Activity  . Alcohol use: No  . Drug use: No  . Sexual activity: Not on file  Other Topics Concern  . Not on file  Social History Narrative  . Not on file   Social  Determinants of Health   Financial Resource Strain: Not on file  Food Insecurity: Not on file  Transportation Needs: Not on file  Physical Activity: Not on file  Stress: Not on file  Social Connections: Not on file  Intimate Partner Violence: Not on file   Allergies  Allergen Reactions  . Sulfasalazine Shortness Of Breath  . Aspirin   . Darvon [Propoxyphene]   . Iodine   . Penicillins   . Sulfa Antibiotics   . Zithromax [Azithromycin]     Last set of Vital Signs (not current) Vitals:   09/28/20 2120 09/28/20 2125  BP: 107/71 95/63  Pulse: (!) 110 (!) 108  Resp: 17 15  Temp:    SpO2: 100% 100%       Physical Exam  Gen: unresponsive initially with generalized tonic-clonic seizure activity. Cardiovascular: Regular rhythm around 120 bpm. Resp: apneic.  Patient currently being bagged with equal breath sounds bilaterally. Abd: nondistended  Neuro: GCS 3, unresponsive to pain not responding to painful stimuli  HEENT: Small amount of blood in posterior pharynx. Neck: No crepitus  Musculoskeletal: No deformity  Skin: warm  Procedures  INTUBATION Performed by: Harvest Dark Required items: required blood products, implants, devices, and special equipment available Patient identity confirmed: provided demographic data and hospital-assigned identification number Time out: Immediately prior to procedure a "time out" was called to verify the correct patient, procedure, equipment, support staff and site/side marked as required. Indications: Airway protection Intubation method: S4 glide scope Preoxygenation: 100% BVM Sedatives: 20 mg etomidate Paralytic: 100 mg succinylcholine Tube Size: 7.5 cuffed Post-procedure assessment: chest rise and ETCO2 monitor Breath sounds: equal and absent over the epigastrium Tube secured by Respiratory Therapy Patient tolerated the procedure well with no immediate complications.  CRITICAL CARE Performed by: Harvest Dark Total critical care time: 20 Critical care time was exclusive of separately billable procedures and treating other patients. Critical care was necessary to treat or prevent imminent or life-threatening deterioration. Critical care was time spent personally by me on the following activities: development of treatment plan with patient and/or surrogate as well as nursing, discussions with consultants, evaluation of patient's response to treatment, examination of patient, obtaining history from patient or surrogate, ordering and performing treatments and interventions, ordering and review of laboratory studies, ordering and review of  radiographic studies, pulse oximetry and re-evaluation of patient's condition.  Medical Decision making  Upon arrival to the patient's room patient found to be actively seizing.  After seizure stop the 2 mg Ativan patient is extremely somnolent not protecting her airway, unresponsive to painful stimuli.  Decision was made by myself to intubate patient for airway protection as the patient required significant amount of sedation in the emergency department prior to admission to the hospitalist, now going to require again significant amount of sedation likely as well as administration of Ativan, etc. patient's airway secured with a glide scope on first attempt without complication.  Equal breath sounds bilaterally after intubation was able to visualize the tube passing through the vocal cords.  CO2 detector with good color change.  Verbally ordered propofol for the patient after sedation.  Once the hospitalist arrived I transitioned care to the hospitalist physician.  Assessment and Plan  Altered mental status, seizure, intubated for airway protection    Harvest Dark, MD 09/28/20 2157

## 2020-09-28 NOTE — ED Notes (Signed)
Restraints no longer in use, Dr Jari Pigg notified

## 2020-09-28 NOTE — ED Notes (Signed)
Pt noted to be restless intermittently and trying to move out of bed. Speaking in incomprehensible sentences. Dr Jimmye Norman aware. Sitter at bedside for safety.

## 2020-09-28 NOTE — Progress Notes (Signed)
Cross Cover Patient arrived to floor in status epilepticus, heart rate 190's and BP 14-150 over 110- 120 All extremities tonic, resp rate in 50's, gasping,  , sate 94 with 2 liters nasal cannula in place. Given total of 4 mg ativan and 5 of metoprolol. Critical Care Np called to bedside for intubation, code called for ED physician to provide intubation given continued seizure.  Additional ativan given and activity began to abate. 2 mg morphine given for possible methadone withdrawal. (pateint was given narcan in ED).   cbg [id number] ED Dr Kerman Passey at bedside.  Etomidate and Succinylcholine given by ED RN for intubation.  Patient transferred to ICU. Teleneurologists called and stat consult fascilitated  for patient with Critical Care NP Domingo Pulse [place]

## 2020-09-28 NOTE — ED Notes (Signed)
Pt very uncooperative, fighting staff, speaking incoherently. Dr Jari Pigg aware. meds given as ordered
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
## 2020-09-28 NOTE — ED Notes (Signed)
Pt repeatedly stating "I need the pretzels, can you get me off the pretzels:"

## 2020-09-28 NOTE — Consult Note (Signed)
NAME:  Natalie [name], MRN:  [id number], DOB:  08/21/1955, LOS: 0 ADMISSION DATE:  09/28/2020, CONSULTATION DATE: 09/28/2020 REFERRING MD: Sharion Settler, NP, CHIEF COMPLAINT: Altered mental status  History of Present Illness:  65 year old female admitted to telemetry due to acute encephalopathy with unknown LKW time.  Per ED documentation & family interview, the patient's neighbor discovered her altered and aphasic covered in stool on 09/28/2020.  Per the patient's children, sister and husband she has been prescribed methadone daily for a " long time, possibly 20 years".  They state the patient has been taking this faithfully and was allowed to get take-home methadone packets from the clinic because of this.  They denied noticing any alcohol intake or recreational drug use.  Of note her significant other over the past 22 years died suddenly in his sleep in 08-30-20.  Since then she has been noted to be falling at home intermittently, it is unknown if she had a loss of consciousness or not and the patient has not sought out medical attention after these falls. ED course: CT head negative for acute intracranial abnormality but does show chronic lacunar infarct.  UDS positive for benzodiazepines which were given in the emergency room as well as methadone which the patient takes chronically.  Acetaminophen and salicylate levels negative & serum alcohol level negative. Initial vitals: Tachypneic at 24, tachycardic at 106, hypertensive at 179/93 with SPO2 99% on room air. [facility] hospitalist service was consulted for admission to telemetry unit, sedating medications were held as patient appeared somnolent but stable with MRI pending.  Rapid response called upon patient's admission to telemetry.  Upon bedside arrival, patient was having full body convulsive tonic-clonic seizure like activity with secretions foam only a yes at the mouth that did not stop with 4 mg of Ativan, 5 mg of metoprolol & 2 mg of  morphine.  Patient was tachycardic in the 190's, tachypneic in the 50's gasping.  CODE BLUE was called for emergent intubation by ED physician due to continuing convulsive seizures. Patient did appear to become post ictal after more than 5 minutes, but was intubated for airway protection due to ongoing somnolence.  PCCM consulted for transfer to ICU.  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 09/28/2020-patient admitted due to altered mental status, upon arrival to 2A patient in status epilepticus ultimately requiring emergent intubation and mechanical ventilation with transfer to ICU.  Interim History / Subjective:  Initial assessment: upon arrival bedside on 2 a, patient having full body convulsive seizure-like activity with secretions foamed at the mouth.  Seizure-like activity did not break after Ativan administration, lasting for more than 5 minutes.  Patient did appear to eventually transition into a post ictal period, but remaining somnolent and emergently intubated for airway protection.  Objective   Blood pressure 119/71, pulse (!) 109, temperature 100 F (37.8 C), temperature source Oral, resp. rate (!) 27, height 5\' 3"  (1.6 m), weight 54.4 kg, SpO2 100 %.    Vent Mode: PRVC FiO2 (%):  [100 %] 100 % Set Rate:  [15 bmp] 15 bmp Vt Set:  [450 mL] 450 mL PEEP:  [5 cmH20] 5 cmH20  No intake or output data  in the 24 hours ending 09/28/20 2119 Filed Weights   09/28/20 1326  Weight: 54.4 kg    Examination: (Upon arrival to ICU) General: Adult female, critically ill, lying in bed intubated & sedated requiring mechanical ventilation  HEENT: MM pink/moist, anicteric,  atraumatic, neck supple Neuro: Unresponsive, unable to follow commands, PERRL +4, MAE CV: s1s2 RRR, sinus tachycardia on monitor, no r/m/g Pulm: Regular, non labored on PRVC at 100% FiO2, breath sounds diminished throughout GI: soft, flat, bs x 4 Skin: Scattered abrasions and ecchymosis from PTA falls Extremities: warm/dry, pulses + 2 R/P, no edema noted  Labs/imaging that I havepersonally reviewed  (right click and "Reselect all SmartList Selections" daily)  09/28/2020 CT head without contrast >> no acute intracranial abnormality, focal hypodensity left thalamus consistent with chronic lacunar infarct new since 2017. 09/28/2020 CT cervical spine without contrast >> no acute cervical spine fracture, multilevel spondylosis and facet hypertrophy. EKG Interpretation Date:  09/28/2020 EKG Time:  15:14 Rate: 106 Rhythm: Sinus Tachycardia QRS Axis:  normal Intervals: prolonged QT/QTc- 410/545 Narrative Interpretation: ST with QT/QTc prolongation  Na+/ K+: 137/ 3.6 BUN/Cr.: 11/ 1.24 Serum CO2/ AG: 28/ 9  Hgb: 13.3 Troponin: 5 CK: 215 Acetaminophen: <27 Salicylate: < 7  WBC/ TMAX: 5/ 37.8 PCT: pending  ABG: Pending CXR: Pending Resolved Hospital Problem list     Assessment & Plan:  Acute Hypoxic Respiratory Failure in the setting of status epilepticus for airway protection PMHx: COPD on chronic O2 outpatient - Ventilator settings: PRVC  8 mL/kg, 100% FiO2, 5 PEEP, continue ventilator support & lung protective strategies - Wean PEEP & FiO2 as tolerated, maintain SpO2 > 90% - Head of bed elevated 30 degrees, VAP protocol in place - Plateau pressures less than 30 cm H20  - Intermittent chest x-ray & ABG PRN - Daily WUA with SBT as tolerated  - Ensure adequate pulmonary hygiene  - F/u cultures, trend PCT - Started Aspiration Pna coverage: cefepime & flagyl - Steroids initiated: solu-medrol 40 mg BID  - Budesonide nebs BID, bronchodilators PRN - PAD protocol in place: continue  Fentanyl IVP & Propofol drip, with versed IVP  Status Epilepticus PMHx: seizures (not on medications outpatient) - STAT repeat head CT - STAT tele-neuro consult, with neurology to follow - EEG in AM - MRI in AM, once stabilized - loaded with Keppra, 750 mg BID to follow due to renal function - benzodiazepines PRN for seizure activity  Acute Encephalopathy PMHx: chronic methadone use, substance use disorder, Hep C Initial head CT wo contrast negative for acute intracranial abnormality, MRI pending. Liver enzymes & ammonia WNL - home methadone on hold due to QTc prolongation - supportive care  CKD stage IIIb Baseline Cr: 1.44, Cr on admission:1.24 - Strict I/O's: alert provider if UOP < 0.5 mL/kg/hr - Daily BMP, replace electrolytes PRN - Avoid nephrotoxic agents as able, ensure adequate renal perfusion - Consult nephrology if iHD or CRRT indicated   HFrEF- (LVEF 40-45% from echo 2015) Patient not currently on CHF medications outpatient - continuous cardiac monitoring - Echocardiogram pending - conservative IVF  Hyperglycemia - CBG monitoring Q 4 h, with SSI - Hemoglobin A1C pending - follow ICU hyper/hypo-glycemia protocol  Best practice (right click and "Reselect all SmartList Selections" daily)  Diet:  NPO Pain/Anxiety/Delirium protocol (if indicated): Yes (RASS goal -1,-2) VAP protocol (if indicated): Yes DVT prophylaxis: Subcutaneous Heparin GI prophylaxis: H2B Glucose control:  SSI Yes Central venous access:  Placing emergently Arterial line:  N/A Foley:  Yes, and it  is still needed Mobility:  bed rest  PT consulted: N/A Last date of multidisciplinary goals of care discussion 09/28/20 Code Status:  full code Disposition: ICU  Labs   CBC: Recent Labs  Lab 09/28/20 1347  WBC 5.0  HGB 13.3  HCT 41.8  MCV 93.7  PLT 102    Basic Metabolic Panel: Recent Labs  Lab 09/28/20 1347  NA 137  K 3.6  CL 100  CO2 28  GLUCOSE 122*  BUN 11  CREATININE 1.24*   CALCIUM 9.4   GFR: Estimated Creatinine Clearance: 38.4 mL/min (A) (by C-G formula based on SCr of 1.24 mg/dL (H)). Recent Labs  Lab 09/28/20 1347  WBC 5.0    Liver Function Tests: Recent Labs  Lab 09/28/20 1347  AST 21  ALT 14  ALKPHOS 105  BILITOT 1.4*  PROT 7.4  ALBUMIN 4.4   No results for input(s): LIPASE, AMYLASE in the last 168 hours. Recent Labs  Lab 09/28/20 1347  AMMONIA 17    ABG No results found for: PHART, PCO2ART, PO2ART, HCO3, TCO2, ACIDBASEDEF, O2SAT   Coagulation Profile: No results for input(s): INR, PROTIME in the last 168 hours.  Cardiac Enzymes: Recent Labs  Lab 09/28/20 1347  CKTOTAL 215    HbA1C: No results found for: HGBA1C  CBG: Recent Labs  Lab 09/28/20 2027 09/28/20 2036  GLUCAP 286* 247*    Review of Systems:   UTA- patient unresponsive, then intubated & sedated   Past Medical History:  She,  has a past medical history of Cataracts, bilateral, Chickenpox (cataracts), COPD (chronic obstructive pulmonary disease) ([place]), Depression, Drug addiction ([place]), Headache, Hepatitis C virus infection, Hepatitis C without hepatic coma, History of chicken pox, History of colon polyps, and Seizures ([place]).   Surgical History:   Past Surgical History:  Procedure Laterality Date  . APPENDECTOMY    . CHOLECYSTECTOMY    . COLONOSCOPY WITH PROPOFOL N/A 04/30/2015   Procedure: COLONOSCOPY WITH PROPOFOL;  Surgeon: Josefine Class, MD;  Location: [facility] ENDOSCOPY;  Service: Endoscopy;  Laterality: N/A;  . COLONOSCOPY WITH PROPOFOL N/A 07/13/2017   Procedure: COLONOSCOPY WITH PROPOFOL;  Surgeon: Manya Silvas, MD;  Location: [facility] ENDOSCOPY;  Service: Endoscopy;  Laterality: N/A;  . ESOPHAGOGASTRODUODENOSCOPY (EGD) WITH PROPOFOL N/A 07/13/2017   Procedure: ESOPHAGOGASTRODUODENOSCOPY (EGD) WITH PROPOFOL;  Surgeon: Manya Silvas, MD;  Location: [facility] ENDOSCOPY;  Service: Endoscopy;  Laterality: N/A;  . EYE SURGERY       Social History:    reports that she has been smoking cigarettes. She has a 20.50 pack-year smoking history. She has never used smokeless tobacco. She reports that she does not drink alcohol and does not use drugs.   Family History:  Her family history includes Breast cancer in her paternal grandmother.   Allergies Allergies  Allergen Reactions  . Sulfasalazine Shortness Of Breath  . Aspirin   . Darvon [Propoxyphene]   . Iodine   . Penicillins   . Sulfa Antibiotics   . Zithromax [Azithromycin]      Home Medications  Prior to Admission medications   Medication Sig Start Date End Date Taking? Authorizing Provider  acetaminophen (TYLENOL) 500 MG tablet Take 500 mg by mouth every 6 (six) hours as needed.    [provider]  albuterol (PROVENTIL HFA;VENTOLIN HFA) 108 (90 BASE) MCG/ACT inhaler Inhale 2 puffs into the lungs every 6 (six) hours as needed for wheezing or shortness of breath.    [provider]  doxycycline (VIBRA-TABS) 100 MG tablet Take  1 tablet (100 mg total) by mouth 2 (two) times daily. Patient not taking: Reported on 09/28/2020 05/29/20   Edrick Kins, DPM  gabapentin (NEURONTIN) 300 MG capsule Take 300 mg by mouth 3 (three) times daily.    [provider]  ipratropium-albuterol (DUONEB) 0.5-2.5 (3) MG/3ML SOLN Take 3 mLs by nebulization every 6 (six) hours. 07/25/17   Bettey Costa, MD  methadone (DOLOPHINE) 10 MG tablet Take 140 mg by mouth daily.     [provider]  mirtazapine (REMERON) 30 MG tablet Take 30 mg by mouth at bedtime.    [provider]  ondansetron (ZOFRAN-ODT) 4 MG disintegrating tablet Take 4 mg by mouth every 8 (eight) hours as needed for nausea or vomiting.    [provider]  oxyCODONE-acetaminophen (PERCOCET) 5-325 MG tablet Take 1 tablet by mouth every 4 (four) hours as needed for severe pain. 06/09/20   Edrick Kins, DPM  tiotropium (SPIRIVA) 18 MCG inhalation capsule Place into inhaler and inhale. 03/25/17  03/25/18  [provider]  traZODone (DESYREL) 100 MG tablet Take 100 mg by mouth at bedtime.    [provider]     Critical care time: 45 minutes       Venetia Night, AGACNP-BC Acute Care Nurse Practitioner [facility]   [phone] / [phone] Please see Amion for pager details.

## 2020-09-28 NOTE — Consult Note (Signed)
[facility] TeleNeurology Consult Services  Stat Consult  Date of Service:   09/28/2020 20:51:36  Diagnosis:     .  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 She is intubated and sedated so her exam is limited. I spoke with her attending for the night and recommended she be loaded with 2 grams of Keppra and then continue 1500 BID. Pharmacy changed that because of her poor renal function to 750 BID. She will have a repeat head CT with a f/u MRI tomorrow. The hospital does not have cEEG monitoring so she will be on Propofol with prn Versed and continue the Fentanyl overnight to rest her brain. She will have a routine EEG in the AM. If the routine is not available on the weekend she will need to be transferred for continuous monitoring. Medicine will then proceed with a metabolic evaluation for other causes of seizure. Her tox screen was positive for methadone and benzodiazepines.  CT HEAD: Showed No Acute Hemorrhage or Acute Core Infarct  Our recommendations are outlined below.  Diagnostic Studies: MRI brain w/wo contrast Routine EEG  Disposition: Neurology will follow   Metrics: [facility] Notification Time: 09/28/2020 20:48:26 Stamp Time: 09/28/2020 20:51:36 Callback Response Time: 09/28/2020 20:52:13   ----------------------------------------------------------------------------------------------------  Chief Complaint: Seizures  History of Present Illness: Patient is a 65 year old Female.  Natalie [name] is a 65 year old female who is on methadone through the clinic, who was admitted to the hospital for an altered mental status. Shortly after she was admitted she had a series of seizures, the last being prolonged. She is now intubated and sedated on versed and Fentanyl. She is not responsive so her history is obtained from the chart. She  does have a history of prior seizures but it has been several years since her last seizure and she is not on any seizure medication at home. At the time of admission, she underwent a head CT to evaluate for stroke and this shows only chronic changes/strokes.   Anticoagulant use:  No  Antiplatelet use: No   Examination: BP(132/78), Pulse(88), Blood Glucose(122)  Coma Exam:  Ventilator:  Yes Sedated: Yes Paralyzed: Yes Breathes spontaneously above ventilator rate: No  Responds to Voice:  No  Responds to Sternal rub:  No  Withdraws limb from painful stimulation:  No  Spontaneous limb movement:  Right arm: No Left arm: No Right leg: No Left leg: No  Pupils:  Pin point  Corneal Reflexes:  Present  Oculocephalic Reflexes:  Normal  Cough/Gag Reflexes:  Absent    Patient / Family was informed the Neurology Consult would occur via TeleHealth consult by way of interactive audio and video telecommunications and consented to receiving care in this manner.  Patient is being evaluated for possible acute neurologic impairment and high probability of imminent or life - threatening deterioration.I spent total of 34 minutes providing care to this patient, including time for face to face visit via telemedicine, review of medical records, imaging studies and discussion of findings with providers, the patient and / or family.   Dr Birdena Jubilee   [facility] [phone]  Case [id number]

## 2020-09-28 NOTE — Consult Note (Signed)
Pharmacy Antibiotic Note  Natalie [name] is a 65 y.o. female admitted on 09/28/2020 with possible aspiration PNA.  Pharmacy has been consulted for cefepime dosing.  Plan: Cefepime 2 g IV q12h Pt is also ordered metronidazole 500 mg IV q8h  Monitor clinical picture and renal function F/U C&S, abx deescalation / LOT   Height: 5\' 3"  (160 cm) Weight: 54.4 kg (120 lb) IBW/kg (Calculated) : 52.4  Temp (24hrs), Avg:98.9 F (37.2 C), Min:97.8 F (36.6 C), Max:100 F (37.8 C)  Recent Labs  Lab 09/28/20 1347  WBC 5.0  CREATININE 1.24*    Estimated Creatinine Clearance: 38.4 mL/min (A) (by C-G formula based on SCr of 1.24 mg/dL (H)).    Allergies  Allergen Reactions  . Sulfasalazine Shortness Of Breath  . Aspirin   . Darvon [Propoxyphene]   . Iodine   . Penicillins   . Sulfa Antibiotics   . Zithromax [Azithromycin]     Antimicrobials this admission: 3/25 cefepime >>  3/25 metronidazole >>   Dose adjustments this admission:   Microbiology results: 3/25 Resp panel: neg  Thank you for allowing pharmacy to be a part of this patient's care.  Darnelle Bos, PharmD 09/28/2020 10:00 PM

## 2020-09-29 ENCOUNTER — Observation Stay: Payer: BC Managed Care – PPO

## 2020-09-29 ENCOUNTER — Encounter: Payer: Self-pay | Admitting: Internal Medicine

## 2020-09-29 ENCOUNTER — Other Ambulatory Visit: Payer: Self-pay

## 2020-09-29 DIAGNOSIS — I6389 Other cerebral infarction: Secondary | ICD-10-CM | POA: Diagnosis not present

## 2020-09-29 DIAGNOSIS — R4182 Altered mental status, unspecified: Secondary | ICD-10-CM

## 2020-09-29 DIAGNOSIS — I130 Hypertensive heart and chronic kidney disease with heart failure and stage 1 through stage 4 chronic kidney disease, or unspecified chronic kidney disease: Secondary | ICD-10-CM | POA: Diagnosis present

## 2020-09-29 DIAGNOSIS — F1190 Opioid use, unspecified, uncomplicated: Secondary | ICD-10-CM | POA: Diagnosis present

## 2020-09-29 DIAGNOSIS — G9340 Encephalopathy, unspecified: Secondary | ICD-10-CM | POA: Diagnosis not present

## 2020-09-29 DIAGNOSIS — Z781 Physical restraint status: Secondary | ICD-10-CM | POA: Diagnosis not present

## 2020-09-29 DIAGNOSIS — Z9049 Acquired absence of other specified parts of digestive tract: Secondary | ICD-10-CM | POA: Diagnosis not present

## 2020-09-29 DIAGNOSIS — R4701 Aphasia: Secondary | ICD-10-CM | POA: Diagnosis present

## 2020-09-29 DIAGNOSIS — F17210 Nicotine dependence, cigarettes, uncomplicated: Secondary | ICD-10-CM | POA: Diagnosis present

## 2020-09-29 DIAGNOSIS — Z8673 Personal history of transient ischemic attack (TIA), and cerebral infarction without residual deficits: Secondary | ICD-10-CM | POA: Diagnosis not present

## 2020-09-29 DIAGNOSIS — I469 Cardiac arrest, cause unspecified: Secondary | ICD-10-CM | POA: Diagnosis not present

## 2020-09-29 DIAGNOSIS — J9601 Acute respiratory failure with hypoxia: Secondary | ICD-10-CM | POA: Diagnosis present

## 2020-09-29 DIAGNOSIS — G9341 Metabolic encephalopathy: Secondary | ICD-10-CM | POA: Diagnosis present

## 2020-09-29 DIAGNOSIS — I5022 Chronic systolic (congestive) heart failure: Secondary | ICD-10-CM | POA: Diagnosis present

## 2020-09-29 DIAGNOSIS — Z79899 Other long term (current) drug therapy: Secondary | ICD-10-CM | POA: Diagnosis not present

## 2020-09-29 DIAGNOSIS — R54 Age-related physical debility: Secondary | ICD-10-CM | POA: Diagnosis present

## 2020-09-29 DIAGNOSIS — I252 Old myocardial infarction: Secondary | ICD-10-CM | POA: Diagnosis not present

## 2020-09-29 DIAGNOSIS — W19XXXA Unspecified fall, initial encounter: Secondary | ICD-10-CM | POA: Diagnosis present

## 2020-09-29 DIAGNOSIS — Z881 Allergy status to other antibiotic agents status: Secondary | ICD-10-CM | POA: Diagnosis not present

## 2020-09-29 DIAGNOSIS — Z9981 Dependence on supplemental oxygen: Secondary | ICD-10-CM | POA: Diagnosis not present

## 2020-09-29 DIAGNOSIS — Z880 Allergy status to penicillin: Secondary | ICD-10-CM | POA: Diagnosis not present

## 2020-09-29 DIAGNOSIS — N1832 Chronic kidney disease, stage 3b: Secondary | ICD-10-CM | POA: Diagnosis present

## 2020-09-29 DIAGNOSIS — Z803 Family history of malignant neoplasm of breast: Secondary | ICD-10-CM | POA: Diagnosis not present

## 2020-09-29 DIAGNOSIS — Z882 Allergy status to sulfonamides status: Secondary | ICD-10-CM | POA: Diagnosis not present

## 2020-09-29 DIAGNOSIS — G40901 Epilepsy, unspecified, not intractable, with status epilepticus: Secondary | ICD-10-CM

## 2020-09-29 DIAGNOSIS — Z20822 Contact with and (suspected) exposure to covid-19: Secondary | ICD-10-CM | POA: Diagnosis present

## 2020-09-29 DIAGNOSIS — Y92009 Unspecified place in unspecified non-institutional (private) residence as the place of occurrence of the external cause: Secondary | ICD-10-CM | POA: Diagnosis not present

## 2020-09-29 DIAGNOSIS — J9690 Respiratory failure, unspecified, unspecified whether with hypoxia or hypercapnia: Secondary | ICD-10-CM

## 2020-09-29 DIAGNOSIS — Z888 Allergy status to other drugs, medicaments and biological substances status: Secondary | ICD-10-CM | POA: Diagnosis not present

## 2020-09-29 DIAGNOSIS — J449 Chronic obstructive pulmonary disease, unspecified: Secondary | ICD-10-CM | POA: Diagnosis present

## 2020-09-29 DIAGNOSIS — G40401 Other generalized epilepsy and epileptic syndromes, not intractable, with status epilepticus: Secondary | ICD-10-CM | POA: Diagnosis present

## 2020-09-29 LAB — CBC
HCT: 40 % (ref 36.0–46.0)
Hemoglobin: 13.5 g/dL (ref 12.0–15.0)
MCH: 30.7 pg (ref 26.0–34.0)
MCHC: 33.8 g/dL (ref 30.0–36.0)
MCV: 90.9 fL (ref 80.0–100.0)
Platelets: 206 10*3/uL (ref 150–400)
RBC: 4.4 MIL/uL (ref 3.87–5.11)
RDW: 14.4 % (ref 11.5–15.5)
WBC: 12.4 10*3/uL — ABNORMAL HIGH (ref 4.0–10.5)
nRBC: 0 % (ref 0.0–0.2)

## 2020-09-29 LAB — PROCALCITONIN: Procalcitonin: 1.42 ng/mL

## 2020-09-29 LAB — GLUCOSE, CAPILLARY
Glucose-Capillary: 105 mg/dL — ABNORMAL HIGH (ref 70–99)
Glucose-Capillary: 110 mg/dL — ABNORMAL HIGH (ref 70–99)
Glucose-Capillary: 118 mg/dL — ABNORMAL HIGH (ref 70–99)
Glucose-Capillary: 118 mg/dL — ABNORMAL HIGH (ref 70–99)
Glucose-Capillary: 132 mg/dL — ABNORMAL HIGH (ref 70–99)
Glucose-Capillary: 140 mg/dL — ABNORMAL HIGH (ref 70–99)
Glucose-Capillary: 144 mg/dL — ABNORMAL HIGH (ref 70–99)

## 2020-09-29 LAB — COMPREHENSIVE METABOLIC PANEL
ALT: 16 U/L (ref 0–44)
AST: 45 U/L — ABNORMAL HIGH (ref 15–41)
Albumin: 3.8 g/dL (ref 3.5–5.0)
Alkaline Phosphatase: 87 U/L (ref 38–126)
Anion gap: 9 (ref 5–15)
BUN: 15 mg/dL (ref 8–23)
CO2: 27 mmol/L (ref 22–32)
Calcium: 8.9 mg/dL (ref 8.9–10.3)
Chloride: 100 mmol/L (ref 98–111)
Creatinine, Ser: 1.2 mg/dL — ABNORMAL HIGH (ref 0.44–1.00)
GFR, Estimated: 51 mL/min — ABNORMAL LOW (ref >60)
Glucose, Bld: 123 mg/dL — ABNORMAL HIGH (ref 70–99)
Potassium: 3 mmol/L — ABNORMAL LOW (ref 3.5–5.1)
Sodium: 136 mmol/L (ref 135–145)
Total Bilirubin: 1.8 mg/dL — ABNORMAL HIGH (ref 0.3–1.2)
Total Protein: 6.7 g/dL (ref 6.5–8.1)

## 2020-09-29 LAB — TRIGLYCERIDES: Triglycerides: 75 mg/dL (ref <150)

## 2020-09-29 LAB — HIV ANTIBODY (ROUTINE TESTING W REFLEX): HIV Screen 4th Generation wRfx: NONREACTIVE

## 2020-09-29 LAB — AMMONIA: Ammonia: 22 umol/L (ref 9–35)

## 2020-09-29 LAB — POTASSIUM: Potassium: 3.9 mmol/L (ref 3.5–5.1)

## 2020-09-29 LAB — HEMOGLOBIN A1C
Hgb A1c MFr Bld: 5.9 % — ABNORMAL HIGH (ref 4.8–5.6)
Mean Plasma Glucose: 122.63 mg/dL

## 2020-09-29 LAB — PHOSPHORUS: Phosphorus: 4.1 mg/dL (ref 2.5–4.6)

## 2020-09-29 LAB — MRSA PCR SCREENING: MRSA by PCR: NEGATIVE

## 2020-09-29 LAB — MAGNESIUM: Magnesium: 2.2 mg/dL (ref 1.7–2.4)

## 2020-09-29 LAB — TSH: TSH: 1.154 u[IU]/mL (ref 0.350–4.500)

## 2020-09-29 MED ORDER — FENTANYL CITRATE (PF) 100 MCG/2ML IJ SOLN
50.0000 ug | INTRAMUSCULAR | Status: DC | PRN
  Administered 2020-09-30: 50 ug via INTRAVENOUS
  Filled 2020-09-29: qty 2

## 2020-09-29 MED ORDER — METHADONE HCL 10 MG PO TABS
10.0000 mg | ORAL_TABLET | Freq: Every day | ORAL | Status: DC
Start: 2020-09-29 — End: 2020-09-29

## 2020-09-29 MED ORDER — CHLORHEXIDINE GLUCONATE 0.12 % MT SOLN
OROMUCOSAL | Status: AC
  Administered 2020-09-29: 15 mL via OROMUCOSAL
  Filled 2020-09-29: qty 15

## 2020-09-29 MED ORDER — POTASSIUM CHLORIDE 20 MEQ PO PACK
20.0000 meq | PACK | ORAL | Status: AC
  Administered 2020-09-29 (×2): 20 meq
  Filled 2020-09-29 (×2): qty 1

## 2020-09-29 MED ORDER — METHYLPREDNISOLONE SODIUM SUCC 40 MG IJ SOLR
40.0000 mg | Freq: Two times a day (BID) | INTRAMUSCULAR | Status: DC
  Administered 2020-09-29 – 2020-09-30 (×3): 40 mg via INTRAVENOUS
  Filled 2020-09-29 (×3): qty 1

## 2020-09-29 MED ORDER — POTASSIUM CHLORIDE 10 MEQ/50ML IV SOLN
10.0000 meq | INTRAVENOUS | Status: AC
  Administered 2020-09-29 (×2): 10 meq via INTRAVENOUS
  Filled 2020-09-29 (×2): qty 50

## 2020-09-29 MED ORDER — THIAMINE HCL 100 MG/ML IJ SOLN
500.0000 mg | Freq: Three times a day (TID) | INTRAVENOUS | Status: DC
  Administered 2020-09-29 – 2020-09-30 (×4): 500 mg via INTRAVENOUS
  Filled 2020-09-29 (×6): qty 5

## 2020-09-29 MED ORDER — BUDESONIDE 0.25 MG/2ML IN SUSP
0.2500 mg | Freq: Two times a day (BID) | RESPIRATORY_TRACT | Status: DC
  Administered 2020-09-29 – 2020-09-30 (×3): 0.25 mg via RESPIRATORY_TRACT
  Filled 2020-09-29 (×3): qty 2

## 2020-09-29 MED ORDER — CHLORHEXIDINE GLUCONATE CLOTH 2 % EX PADS
6.0000 | MEDICATED_PAD | Freq: Every day | CUTANEOUS | Status: DC
  Administered 2020-09-29 – 2020-09-30 (×2): 6 via TOPICAL

## 2020-09-29 NOTE — Consult Note (Signed)
Pharmacy Antibiotic Note  Natalie [name] is a 65 y.o. female admitted on 09/28/2020 with possible aspiration PNA.  Pharmacy has been consulted for cefepime dosing.  Plan: Cefepime 2 g IV q12h Pt is also ordered metronidazole 500 mg IV q8h  Monitor clinical picture and renal function F/U C&S, abx deescalation / LOT   Height: 5\' 3"  (160 cm) Weight: 54.4 kg (120 lb) IBW/kg (Calculated) : 52.4  Temp (24hrs), Avg:97.9 F (36.6 C), Min:97 F (36.1 C), Max:100 F (37.8 C)  Recent Labs  Lab 09/28/20 1347 09/29/20 0430  WBC 5.0 12.4*  CREATININE 1.12*  1.24* 1.20*    Estimated Creatinine Clearance: 39.7 mL/min (A) (by C-G formula based on SCr of 1.2 mg/dL (H)).    Allergies  Allergen Reactions  . Sulfasalazine Shortness Of Breath  . Aspirin   . Darvon [Propoxyphene]   . Iodine   . Penicillins   . Sulfa Antibiotics   . Zithromax [Azithromycin]     Antimicrobials this admission: 3/25 cefepime >>  3/25 metronidazole >>   Dose adjustments this admission:   Microbiology results: 3/25 Resp panel: neg  Thank you for allowing pharmacy to be a part of this patient's care.  Merrill,Kristin A, PharmD 09/29/2020 1:07 PM

## 2020-09-29 NOTE — Significant Event (Signed)
Patient arrived to the unit in distress from the ED.  Patient is rigid, all extremities tonic, foaming from the mouth. What looks to be a seizure. Patient placed on Monitor HR 190, resp 40's, BP 140-150 over 110-120's.  O2 placed on patient, Sharion Settler NP called to report to bedside.  Ativan 2mg , Metoprolol 5mg  ordered and given.  A total of 6mg  of ativan ordered and given until patient started to settle.  Morphine ordered and given for possible methadone withdrawal.   NP from ICU called to bedside, Dr. Sidney Ace and ER MD called to intubate.  Patient was  transfer to ICU after intubation.

## 2020-09-29 NOTE — Consult Note (Signed)
NAME:  Natalie [name], MRN:  [id number], DOB:  Dec 15, 1955, LOS: 0 ADMISSION DATE:  09/28/2020, CONSULTATION DATE: 09/28/2020 REFERRING MD: Sharion Settler, NP, CHIEF COMPLAINT: Altered mental status  History of Present Illness:  65 year old female admitted to telemetry due to acute encephalopathy with unknown LKW time.  Per ED documentation & family interview, the patient's neighbor discovered her altered and aphasic covered in stool on 09/28/2020.  Per the patient's children, sister and husband she has been prescribed methadone daily for a " long time, possibly 20 years".  They state the patient has been taking this faithfully and was allowed to get take-home methadone packets from the clinic because of this.  They denied noticing any alcohol intake or recreational drug use.  Of note her significant other over the past 22 years died suddenly in his sleep in August 29, 2020.  Since then she has been noted to be falling at home intermittently, it is unknown if she had a loss of consciousness or not and the patient has not sought out medical attention after these falls. ED course: CT head negative for acute intracranial abnormality but does show chronic lacunar infarct.  UDS positive for benzodiazepines which were given in the emergency room as well as methadone which the patient takes chronically.  Acetaminophen and salicylate levels negative & serum alcohol level negative. Initial vitals: Tachypneic at 24, tachycardic at 106, hypertensive at 179/93 with SPO2 99% on room air. [facility] was consulted for admission to telemetry unit, sedating medications were held as patient appeared somnolent but stable with MRI pending.  Rapid response called upon patient's admission to telemetry.  Upon bedside arrival, patient was having full body convulsive tonic-clonic seizure like activity with secretions foam only a yes at the mouth that did not stop with 4 mg of Ativan, 5 mg of metoprolol & 2 mg of  morphine.  Patient was tachycardic in the 190's, tachypneic in the 50's gasping.  CODE BLUE was called for emergent intubation by ED physician due to continuing convulsive seizures. Patient did appear to become post ictal after more than 5 minutes, but was intubated for airway protection due to ongoing somnolence.  PCCM consulted for transfer to ICU.  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 09/28/2020-patient admitted due to altered mental status, upon arrival to 2A patient in status epilepticus ultimately requiring emergent intubation and mechanical ventilation with transfer to ICU.  Interim History / Subjective:  Initial assessment: upon arrival bedside on 2 a, patient having full body convulsive seizure-like activity with secretions foamed at the mouth.  Seizure-like activity did not break after Ativan administration, lasting for more than 5 minutes.  Patient did appear to eventually transition into a post ictal period, but remaining somnolent and emergently intubated for airway protection.  Objective   Blood pressure 128/84, pulse 81, temperature (!) 97.2 F (36.2 C), temperature source Esophageal, resp. rate 18, height 5\' 3"  (1.6 m), weight 54.4 kg, SpO2 100 %.    Vent Mode: PRVC FiO2 (%):  [50 %-100 %] 50 % Set Rate:  [15 bmp] 15 bmp Vt Set:  [450 mL] 450 mL PEEP:  [5 cmH20] 5 cmH20 Plateau Pressure:  [12 cmH20] 12  cmH20   Intake/Output Summary (Last 24 hours) at 09/29/2020 1752 Last data filed at 09/29/2020 1600 Gross per 24 hour  Intake 1123.21 ml  Output 1725 ml  Net -601.79 ml   Filed Weights   09/28/20 1326  Weight: 54.4 kg    Examination: (Upon arrival  to ICU) General: Adult female, critically ill, lying in bed intubated & sedated requiring mechanical ventilation  HEENT: MM pink/moist, anicteric, atraumatic, neck supple Neuro: Unresponsive, unable to follow commands, PERRL +4, MAE CV: s1s2 RRR, sinus tachycardia on monitor, no r/m/g Pulm: Regular, non labored on PRVC at 100% FiO2, breath sounds diminished throughout GI: soft, flat, bs x 4 Skin: Scattered abrasions and ecchymosis from PTA falls Extremities: warm/dry, pulses + 2 R/P, no edema noted  Labs/imaging that I havepersonally reviewed  (right click and "Reselect all SmartList Selections" daily)  09/28/2020 CT head without contrast >> no acute intracranial abnormality, focal hypodensity left thalamus consistent with chronic lacunar infarct new since 2017. 09/28/2020 CT cervical spine without contrast >> no acute cervical spine fracture, multilevel spondylosis and facet hypertrophy. EKG Interpretation Date:  09/28/2020 EKG Time:  15:14 Rate: 106 Rhythm: Sinus Tachycardia QRS Axis:  normal Intervals: prolonged QT/QTc- 410/545 Narrative Interpretation: ST with QT/QTc prolongation  Na+/ K+: 137/ 3.6 BUN/Cr.: 11/ 1.24 Serum CO2/ AG: 28/ 9  Hgb: 13.3 Troponin: 5 CK: 215 Acetaminophen: <16 Salicylate: < 7  WBC/ TMAX: 5/ 37.8 PCT: pending  ABG: Pending CXR: Pending Resolved Hospital Problem list     Assessment & Plan:  Acute Hypoxic Respiratory Failure in the setting of status epilepticus for airway protection PMHx: COPD on chronic O2 outpatient - Ventilator settings: PRVC  8 mL/kg, 100% FiO2, 5 PEEP, continue ventilator support & lung protective strategies - Wean PEEP & FiO2 as tolerated, maintain SpO2 > 90% - Head of bed elevated 30 degrees, VAP protocol in place - Plateau pressures less than 30 cm H20  - Intermittent chest x-ray & ABG PRN - Daily WUA with SBT as tolerated  - Ensure adequate pulmonary hygiene  - F/u cultures, trend PCT - Started Aspiration Pna  coverage: cefepime & flagyl - Steroids initiated: solu-medrol 40 mg BID  - Budesonide nebs BID, bronchodilators PRN - PAD protocol in place: continue Fentanyl IVP & Propofol drip, with versed IVP  Status Epilepticus PMHx: seizures (not on medications outpatient) - STAT repeat head CT - STAT tele-neuro consult, with neurology to follow - EEG repeat pending - MRI without acute finding - loaded with Keppra, 750 mg BID to follow due to renal function - benzodiazepines PRN for seizure activity  Acute Encephalopathy PMHx: chronic methadone use, substance use disorder, Hep C Initial head CT wo contrast negative for acute intracranial abnormality, MRI pending. Liver enzymes & ammonia WNL - home methadone on hold due to QTc prolongation - supportive care  CKD stage IIIb . Lab Results  Component Value Date   CREATININE 1.20 (H) 09/29/2020   BUN 15 09/29/2020   NA 136 09/29/2020   K 3.9 09/29/2020   CL 100 09/29/2020   CO2 27 09/29/2020    Baseline Cr: 1.44, Cr on admission:1.24 - Strict I/O's: alert provider if UOP < 0.5 mL/kg/hr - Daily BMP, replace electrolytes PRN - Avoid nephrotoxic agents as able, ensure adequate renal perfusion - Consult nephrology if iHD or CRRT indicated   HFrEF- (LVEF 40-45% from echo 2015) Patient not currently on CHF medications outpatient - continuous cardiac monitoring - Echocardiogram pending - conservative IVF  Hyperglycemia - CBG monitoring Q  4 h, with SSI - Hemoglobin A1C pending - follow ICU hyper/hypo-glycemia protocol  Best practice (right click and "Reselect all SmartList Selections" daily)  Diet:  NPO Pain/Anxiety/Delirium protocol (if indicated): Yes (RASS goal -1,-2) VAP protocol (if indicated): Yes DVT prophylaxis: Subcutaneous Heparin GI prophylaxis: H2B Glucose control:  SSI Yes Central venous access:  Placing emergently Arterial line:  N/A Foley:  Yes, and it is still needed Mobility:  bed rest  PT consulted: N/A Last  date of multidisciplinary goals of care discussion 09/28/20 Code Status:  full code Disposition: ICU  Labs   CBC: Recent Labs  Lab 09/28/20 1347 09/29/20 0430  WBC 5.0 12.4*  HGB 13.3 13.5  HCT 41.8 40.0  MCV 93.7 90.9  PLT 171 370    Basic Metabolic Panel: Recent Labs  Lab 09/28/20 1347 09/29/20 0430 09/29/20 1541  NA 137  137 136  --   K 3.7  3.6 3.0* 3.9  CL 100  100 100  --   CO2 27  28 27   --   GLUCOSE 114*  122* 123*  --   BUN 11  11 15   --   CREATININE 1.12*  1.24* 1.20*  --   CALCIUM 9.2  9.4 8.9  --   MG 2.0 2.2  --   PHOS  --  4.1  --    GFR: Estimated Creatinine Clearance: 39.7 mL/min (A) (by C-G formula based on SCr of 1.2 mg/dL (H)). Recent Labs  Lab 09/28/20 1347 09/29/20 0430  PROCALCITON <0.10 1.42  WBC 5.0 12.4*    Liver Function Tests: Recent Labs  Lab 09/28/20 1347 09/29/20 0430  AST 22  21 45*  ALT 14  14 16   ALKPHOS 99  105 87  BILITOT 1.3*  1.4* 1.8*  PROT 7.3  7.4 6.7  ALBUMIN 4.2  4.4 3.8   No results for input(s): LIPASE, AMYLASE in the last 168 hours. Recent Labs  Lab 09/28/20 1347 09/29/20 0430  AMMONIA 17 22    ABG    Component Value Date/Time   PHART 7.48 (H) 09/28/2020 2342   PCO2ART 35 09/28/2020 2342   PO2ART 422 (H) 09/28/2020 2342   HCO3 26.1 09/28/2020 2342   ACIDBASEDEF 0.4 09/28/2020 2201   O2SAT 100.0 09/28/2020 2342     Coagulation Profile: No results for input(s): INR, PROTIME in the last 168 hours.  Cardiac Enzymes: Recent Labs  Lab 09/28/20 1347  CKTOTAL 215    HbA1C: Hgb A1c MFr Bld  Date/Time Value Ref Range Status  09/28/2020 01:47 PM 5.9 (H) 4.8 - 5.6 % Final    Comment:    (NOTE) Pre diabetes:          5.7%-6.4%  Diabetes:              >6.4%  Glycemic control for   <7.0% adults with diabetes     CBG: Recent Labs  Lab 09/29/20 0036 09/29/20 0416 09/29/20 0739 09/29/20 1058 09/29/20 1546  GLUCAP 140* 118* 110* 118* 144*    Review of Systems:   UTA-  patient unresponsive, then intubated & sedated   Past Medical History:  She,  has a past medical history of Cataracts, bilateral, Chickenpox (cataracts), COPD (chronic obstructive pulmonary disease) ([place]), Depression, Drug addiction ([place]), Headache, Hepatitis C virus infection, Hepatitis C without hepatic coma, History of chicken pox, History of colon polyps, and Seizures ([place]).   Surgical History:   Past Surgical History:  Procedure Laterality Date  . APPENDECTOMY    .  CHOLECYSTECTOMY    . COLONOSCOPY WITH PROPOFOL N/A 04/30/2015   Procedure: COLONOSCOPY WITH PROPOFOL;  Surgeon: Josefine Class, MD;  Location: [facility] ENDOSCOPY;  Service: Endoscopy;  Laterality: N/A;  . COLONOSCOPY WITH PROPOFOL N/A 07/13/2017   Procedure: COLONOSCOPY WITH PROPOFOL;  Surgeon: Manya Silvas, MD;  Location: [facility] ENDOSCOPY;  Service: Endoscopy;  Laterality: N/A;  . ESOPHAGOGASTRODUODENOSCOPY (EGD) WITH PROPOFOL N/A 07/13/2017   Procedure: ESOPHAGOGASTRODUODENOSCOPY (EGD) WITH PROPOFOL;  Surgeon: Manya Silvas, MD;  Location: [facility] ENDOSCOPY;  Service: Endoscopy;  Laterality: N/A;  . EYE SURGERY       Social History:   reports that she has been smoking cigarettes. She has a 20.50 pack-year smoking history. She has never used smokeless tobacco. She reports that she does not drink alcohol and does not use drugs.   Family History:  Her family history includes Breast cancer in her paternal grandmother.   Allergies Allergies  Allergen Reactions  . Sulfasalazine Shortness Of Breath  . Aspirin   . Darvon [Propoxyphene]   . Iodine   . Penicillins   . Sulfa Antibiotics   . Zithromax [Azithromycin]      Home Medications  Prior to Admission medications   Medication Sig Start Date End Date Taking? Authorizing Provider  acetaminophen (TYLENOL) 500 MG tablet Take 500 mg by mouth every 6 (six) hours as needed.    [provider]  albuterol (PROVENTIL HFA;VENTOLIN HFA) 108 (90 BASE) MCG/ACT  inhaler Inhale 2 puffs into the lungs every 6 (six) hours as needed for wheezing or shortness of breath.    [provider]  doxycycline (VIBRA-TABS) 100 MG tablet Take 1 tablet (100 mg total) by mouth 2 (two) times daily. Patient not taking: Reported on 09/28/2020 05/29/20   Edrick Kins, DPM  gabapentin (NEURONTIN) 300 MG capsule Take 300 mg by mouth 3 (three) times daily.    [provider]  ipratropium-albuterol (DUONEB) 0.5-2.5 (3) MG/3ML SOLN Take 3 mLs by nebulization every 6 (six) hours. 07/25/17   Bettey Costa, MD  methadone (DOLOPHINE) 10 MG tablet Take 140 mg by mouth daily.     [provider]  mirtazapine (REMERON) 30 MG tablet Take 30 mg by mouth at bedtime.    [provider]  ondansetron (ZOFRAN-ODT) 4 MG disintegrating tablet Take 4 mg by mouth every 8 (eight) hours as needed for nausea or vomiting.    [provider]  oxyCODONE-acetaminophen (PERCOCET) 5-325 MG tablet Take 1 tablet by mouth every 4 (four) hours as needed for severe pain. 06/09/20   Edrick Kins, DPM  tiotropium (SPIRIVA) 18 MCG inhalation capsule Place into inhaler and inhale. 03/25/17 03/25/18  [provider]  traZODone (DESYREL) 100 MG tablet Take 100 mg by mouth at bedtime.    [provider]     Critical care time: 35 minutes

## 2020-09-29 NOTE — Progress Notes (Signed)
eeg done °

## 2020-09-29 NOTE — Procedures (Signed)
History: 65 year old female being evaluated for seizures  Sedation: Propofol 30 mcg/kg/min  Technique: This is a 21 channel routine scalp EEG performed at the bedside with bipolar and monopolar montages arranged in accordance to the international 10/20 system of electrode placement. One channel was dedicated to EKG recording.    Background: The background is highly disorganized consisting predominantly of generalized irregular slow activity.  There are frequent generalized periodic discharges with triphasic morphology with a shifting bifrontal predominance (triphasic waves).  There also brief runs of beta activity with a central predominance resembling sleep spindles. There are occasional brief suppressions of the background lasting less than 1 second.  Photic stimulation: Physiologic driving is now performed  EEG Abnormalities: 1) generalized periodic discharges with triphasic morphology (triphasic waves) 2) discontinuous disorganized background  Clinical Interpretation: This EEG is consistent with a moderate to severe nonspecific generalized cerebral dysfunction (encephalopathy).   There was no seizure or seizure predisposition recorded on this study. Please note that lack of epileptiform activity on EEG does not preclude the possibility of epilepsy.   Roland Rack, MD [facility] [phone]  If 7pm- 7am, please page neurology on call as listed in [place].

## 2020-09-29 NOTE — Consult Note (Signed)
Neurology Consultation Reason for Consult: Seizures Referring Physician: Rust-Chester, B  CC: Seizures  History is obtained from: Chart review  HPI: Natalie [name] is a 65 y.o. female with a remote history of seizures (none for quite a long time) who presented to the emergency department yesterday with altered mental status.  She was in her normal state of health on 3/23, but then on 3/25 she was found confused.  She has a history of drugs of abuse, but was positive only for benzodiazepines and methadone which were both accounting for iatrogenically.  MRI was ordered, but was not completed prior to the patient having seizure activity.  She continued to have seizure activity despite 4 mg of IV Ativan and was intubated and started on propofol as well as loaded with 2 g of IV Keppra.  She has a remote history of seizures, but none in the past 20 years or so.  She was not taking any antiseizure medicine prior to admission.  Blood pressures were initially elevated ranging from 170s to 190s, but were labile overnight.   ROS: Unable to obtain due to altered mental status.   Past Medical History:  Diagnosis Date  . Cataracts, bilateral   . Chickenpox cataracts  . COPD (chronic obstructive pulmonary disease) ([place])   . Depression   . Drug addiction ([place])   . Headache   . Hepatitis C virus infection   . Hepatitis C without hepatic coma   . History of chicken pox   . History of colon polyps   . Seizures ([place])      Family History  Problem Relation Age of Onset  . Breast cancer Paternal Grandmother      Social History:  reports that she has been smoking cigarettes. She has a 20.50 pack-year smoking history. She has never used smokeless tobacco. She reports that she does not drink alcohol and does not use drugs.   Exam: Current vital signs: BP (!) 146/74   Pulse 80   Temp (!) 97.2 F (36.2 C) (Esophageal)   Resp 18   Ht 5\' 3"  (1.6 m)   Wt 54.4 kg   SpO2 100%   BMI 21.26 kg/m  Vital  signs in last 24 hours: Temp:  [97 F (36.1 C)-100 F (37.8 C)] 97.2 F (36.2 C) (03/26 0800) Pulse Rate:  [68-190] 80 (03/26 1000) Resp:  [15-35] 18 (03/26 1000) BP: (67-209)/(52-119) 146/74 (03/26 1000) SpO2:  [93 %-100 %] 100 % (03/26 1000) FiO2 (%):  [50 %-100 %] 50 % (03/26 0825) Weight:  [54.4 kg] 54.4 kg (03/25 1326)   Physical Exam  Constitutional: Appears older than stated age Psych: Does not respond Eyes: No scleral injection HENT: ET tube in place MSK: no joint deformities.  Cardiovascular: Normal rate and regular rhythm.  Respiratory: Effort normal, non-labored breathing GI: Soft.  No distension. There is no tenderness.  Skin: WDI  Neuro: Mental Status: With noxious stimulation, the patient grimaces and withdraws but does not open eyes or follow commands  cranial Nerves: II: Does not blink to threat pupils are large equal, round, and reactive to light.   III,IV, VI: Her eyes are slightly downwardly deviated, but I do see him cross midline in both directions at times V: VII: Corneals are intact bilaterally Motor: She withdraws to noxious stimulation, with some quasipurposeful movements bilaterally.  She does not clearly cross midline to localize. sensory: She response to noxious simulation bilaterally Cerebellar: She does not perform      I have  reviewed labs in epic and the results pertinent to this consultation are: No leukocytosis initially, after intubation/seizure, WBC of 12.4 Creatinine 1.2 Potassium 3.0 Ammonia 22 Procalcitonin - negative  I have reviewed the images obtained: CT head-no definitely acute findings  Impression: 65 year old female with a history of drug abuse as well as remote history of seizures who presents with altered mental status followed by seizure activity.  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  I have discussed with the EEG tech who will be able to come in on her day off and do an EEG today, if this is negative, then I think a neck step would be MRI.  Recommendations: 1) EEG 2) MRI brain 3) continue Keppra 750 mg twice daily 4) neurology to follow   Roland Rack, MD [facility] [phone]  If 7pm- 7am, please page neurology on call as listed in [place].

## 2020-09-29 NOTE — Progress Notes (Signed)
Pt transferred from floor 2A, pt has silver jewelery in a sealed cup at bedside,and pt partial dentures in pink denture cup at bedside.

## 2020-09-30 ENCOUNTER — Inpatient Hospital Stay (HOSPITAL_COMMUNITY)
Admission: AD | Admit: 2020-09-30 | Discharge: 2020-10-11 | DRG: 100 | Disposition: A | Discharge status: Rehabilitation Facility | Payer: BC Managed Care – PPO | Source: Other Acute Inpatient Hospital | Attending: Internal Medicine | Admitting: Internal Medicine

## 2020-09-30 ENCOUNTER — Inpatient Hospital Stay (HOSPITAL_COMMUNITY): Payer: BC Managed Care – PPO

## 2020-09-30 ENCOUNTER — Inpatient Hospital Stay: Payer: BC Managed Care – PPO

## 2020-09-30 DIAGNOSIS — G9340 Encephalopathy, unspecified: Secondary | ICD-10-CM

## 2020-09-30 DIAGNOSIS — Z881 Allergy status to other antibiotic agents status: Secondary | ICD-10-CM

## 2020-09-30 DIAGNOSIS — R29702 NIHSS score 2: Secondary | ICD-10-CM | POA: Diagnosis not present

## 2020-09-30 DIAGNOSIS — I63512 Cerebral infarction due to unspecified occlusion or stenosis of left middle cerebral artery: Secondary | ICD-10-CM | POA: Diagnosis not present

## 2020-09-30 DIAGNOSIS — J9601 Acute respiratory failure with hypoxia: Secondary | ICD-10-CM

## 2020-09-30 DIAGNOSIS — R4182 Altered mental status, unspecified: Secondary | ICD-10-CM | POA: Diagnosis present

## 2020-09-30 DIAGNOSIS — R4189 Other symptoms and signs involving cognitive functions and awareness: Secondary | ICD-10-CM | POA: Diagnosis present

## 2020-09-30 DIAGNOSIS — J9690 Respiratory failure, unspecified, unspecified whether with hypoxia or hypercapnia: Secondary | ICD-10-CM

## 2020-09-30 DIAGNOSIS — E46 Unspecified protein-calorie malnutrition: Secondary | ICD-10-CM | POA: Diagnosis not present

## 2020-09-30 DIAGNOSIS — T465X5A Adverse effect of other antihypertensive drugs, initial encounter: Secondary | ICD-10-CM | POA: Diagnosis not present

## 2020-09-30 DIAGNOSIS — I63412 Cerebral infarction due to embolism of left middle cerebral artery: Secondary | ICD-10-CM | POA: Diagnosis not present

## 2020-09-30 DIAGNOSIS — B182 Chronic viral hepatitis C: Secondary | ICD-10-CM | POA: Diagnosis present

## 2020-09-30 DIAGNOSIS — G8191 Hemiplegia, unspecified affecting right dominant side: Secondary | ICD-10-CM | POA: Diagnosis not present

## 2020-09-30 DIAGNOSIS — R41844 Frontal lobe and executive function deficit: Secondary | ICD-10-CM | POA: Diagnosis not present

## 2020-09-30 DIAGNOSIS — R488 Other symbolic dysfunctions: Secondary | ICD-10-CM | POA: Diagnosis not present

## 2020-09-30 DIAGNOSIS — I952 Hypotension due to drugs: Secondary | ICD-10-CM | POA: Diagnosis not present

## 2020-09-30 DIAGNOSIS — G894 Chronic pain syndrome: Secondary | ICD-10-CM | POA: Diagnosis not present

## 2020-09-30 DIAGNOSIS — I6330 Cerebral infarction due to thrombosis of unspecified cerebral artery: Secondary | ICD-10-CM | POA: Diagnosis not present

## 2020-09-30 DIAGNOSIS — F17200 Nicotine dependence, unspecified, uncomplicated: Secondary | ICD-10-CM | POA: Diagnosis not present

## 2020-09-30 DIAGNOSIS — Z880 Allergy status to penicillin: Secondary | ICD-10-CM

## 2020-09-30 DIAGNOSIS — E0781 Sick-euthyroid syndrome: Secondary | ICD-10-CM | POA: Diagnosis present

## 2020-09-30 DIAGNOSIS — E8809 Other disorders of plasma-protein metabolism, not elsewhere classified: Secondary | ICD-10-CM | POA: Diagnosis not present

## 2020-09-30 DIAGNOSIS — Z885 Allergy status to narcotic agent status: Secondary | ICD-10-CM

## 2020-09-30 DIAGNOSIS — J9621 Acute and chronic respiratory failure with hypoxia: Secondary | ICD-10-CM | POA: Diagnosis present

## 2020-09-30 DIAGNOSIS — R1312 Dysphagia, oropharyngeal phase: Secondary | ICD-10-CM | POA: Diagnosis present

## 2020-09-30 DIAGNOSIS — G40901 Epilepsy, unspecified, not intractable, with status epilepticus: Secondary | ICD-10-CM | POA: Diagnosis present

## 2020-09-30 DIAGNOSIS — R451 Restlessness and agitation: Secondary | ICD-10-CM | POA: Diagnosis not present

## 2020-09-30 DIAGNOSIS — E538 Deficiency of other specified B group vitamins: Secondary | ICD-10-CM | POA: Diagnosis present

## 2020-09-30 DIAGNOSIS — Z882 Allergy status to sulfonamides status: Secondary | ICD-10-CM

## 2020-09-30 DIAGNOSIS — F1120 Opioid dependence, uncomplicated: Secondary | ICD-10-CM | POA: Diagnosis present

## 2020-09-30 DIAGNOSIS — F17210 Nicotine dependence, cigarettes, uncomplicated: Secondary | ICD-10-CM | POA: Diagnosis present

## 2020-09-30 DIAGNOSIS — E876 Hypokalemia: Secondary | ICD-10-CM | POA: Diagnosis present

## 2020-09-30 DIAGNOSIS — R4701 Aphasia: Secondary | ICD-10-CM | POA: Diagnosis present

## 2020-09-30 DIAGNOSIS — G928 Other toxic encephalopathy: Secondary | ICD-10-CM | POA: Diagnosis present

## 2020-09-30 DIAGNOSIS — G9341 Metabolic encephalopathy: Secondary | ICD-10-CM | POA: Diagnosis not present

## 2020-09-30 DIAGNOSIS — Z681 Body mass index (BMI) 19 or less, adult: Secondary | ICD-10-CM

## 2020-09-30 DIAGNOSIS — I69391 Dysphagia following cerebral infarction: Secondary | ICD-10-CM | POA: Diagnosis not present

## 2020-09-30 DIAGNOSIS — R54 Age-related physical debility: Secondary | ICD-10-CM | POA: Diagnosis present

## 2020-09-30 DIAGNOSIS — J449 Chronic obstructive pulmonary disease, unspecified: Secondary | ICD-10-CM | POA: Diagnosis present

## 2020-09-30 DIAGNOSIS — G8929 Other chronic pain: Secondary | ICD-10-CM | POA: Diagnosis present

## 2020-09-30 DIAGNOSIS — Z888 Allergy status to other drugs, medicaments and biological substances status: Secondary | ICD-10-CM

## 2020-09-30 DIAGNOSIS — Z79899 Other long term (current) drug therapy: Secondary | ICD-10-CM

## 2020-09-30 DIAGNOSIS — Z781 Physical restraint status: Secondary | ICD-10-CM

## 2020-09-30 DIAGNOSIS — N1831 Chronic kidney disease, stage 3a: Secondary | ICD-10-CM | POA: Diagnosis present

## 2020-09-30 DIAGNOSIS — K219 Gastro-esophageal reflux disease without esophagitis: Secondary | ICD-10-CM | POA: Diagnosis present

## 2020-09-30 DIAGNOSIS — E875 Hyperkalemia: Secondary | ICD-10-CM | POA: Diagnosis not present

## 2020-09-30 DIAGNOSIS — Z886 Allergy status to analgesic agent status: Secondary | ICD-10-CM

## 2020-09-30 DIAGNOSIS — I639 Cerebral infarction, unspecified: Secondary | ICD-10-CM | POA: Diagnosis not present

## 2020-09-30 DIAGNOSIS — R030 Elevated blood-pressure reading, without diagnosis of hypertension: Secondary | ICD-10-CM | POA: Diagnosis present

## 2020-09-30 DIAGNOSIS — N170 Acute kidney failure with tubular necrosis: Secondary | ICD-10-CM | POA: Diagnosis not present

## 2020-09-30 DIAGNOSIS — L8990 Pressure ulcer of unspecified site, unspecified stage: Secondary | ICD-10-CM | POA: Insufficient documentation

## 2020-09-30 DIAGNOSIS — I6389 Other cerebral infarction: Secondary | ICD-10-CM | POA: Diagnosis not present

## 2020-09-30 DIAGNOSIS — Z803 Family history of malignant neoplasm of breast: Secondary | ICD-10-CM

## 2020-09-30 DIAGNOSIS — E440 Moderate protein-calorie malnutrition: Secondary | ICD-10-CM | POA: Diagnosis present

## 2020-09-30 DIAGNOSIS — E785 Hyperlipidemia, unspecified: Secondary | ICD-10-CM | POA: Diagnosis present

## 2020-09-30 DIAGNOSIS — R4587 Impulsiveness: Secondary | ICD-10-CM | POA: Diagnosis present

## 2020-09-30 DIAGNOSIS — Z8719 Personal history of other diseases of the digestive system: Secondary | ICD-10-CM

## 2020-09-30 LAB — GLUCOSE, CAPILLARY
Glucose-Capillary: 107 mg/dL — ABNORMAL HIGH (ref 70–99)
Glucose-Capillary: 97 mg/dL (ref 70–99)
Glucose-Capillary: 121 mg/dL — ABNORMAL HIGH (ref 70–99)
Glucose-Capillary: 103 mg/dL — ABNORMAL HIGH (ref 70–99)
Glucose-Capillary: 89 mg/dL (ref 70–99)
Glucose-Capillary: 120 mg/dL — ABNORMAL HIGH (ref 70–99)

## 2020-09-30 LAB — CBC
HCT: 37.3 % (ref 36.0–46.0)
HCT: 36.7 % (ref 36.0–46.0)
Hemoglobin: 12.4 g/dL (ref 12.0–15.0)
Hemoglobin: 12.4 g/dL (ref 12.0–15.0)
MCH: 30.8 pg (ref 26.0–34.0)
MCH: 31.6 pg (ref 26.0–34.0)
MCHC: 33.2 g/dL (ref 30.0–36.0)
MCHC: 33.8 g/dL (ref 30.0–36.0)
MCV: 92.6 fL (ref 80.0–100.0)
MCV: 93.4 fL (ref 80.0–100.0)
Platelets: 154 10*3/uL (ref 150–400)
Platelets: 159 10*3/uL (ref 150–400)
RBC: 4.03 MIL/uL (ref 3.87–5.11)
RBC: 3.93 MIL/uL (ref 3.87–5.11)
RDW: 14.9 % (ref 11.5–15.5)
RDW: 15 % (ref 11.5–15.5)
WBC: 9.4 10*3/uL (ref 4.0–10.5)
WBC: 9.2 10*3/uL (ref 4.0–10.5)
nRBC: 0 % (ref 0.0–0.2)
nRBC: 0 % (ref 0.0–0.2)

## 2020-09-30 LAB — CSF CELL COUNT WITH DIFFERENTIAL
Eosinophils, CSF: 0 %
Lymphs, CSF: 42 %
Monocyte-Macrophage-Spinal Fluid: 52 %
RBC Count, CSF: 11 /mm3 — ABNORMAL HIGH (ref 0–3)
Segmented Neutrophils-CSF: 6 %
Tube #: 2
WBC, CSF: 5 /mm3 (ref 0–5)

## 2020-09-30 LAB — COMPREHENSIVE METABOLIC PANEL
ALT: 17 U/L (ref 0–44)
AST: 41 U/L (ref 15–41)
Albumin: 3.3 g/dL — ABNORMAL LOW (ref 3.5–5.0)
Alkaline Phosphatase: 71 U/L (ref 38–126)
Anion gap: 8 (ref 5–15)
BUN: 19 mg/dL (ref 8–23)
CO2: 23 mmol/L (ref 22–32)
Calcium: 9.2 mg/dL (ref 8.9–10.3)
Chloride: 102 mmol/L (ref 98–111)
Creatinine, Ser: 1.2 mg/dL — ABNORMAL HIGH (ref 0.44–1.00)
GFR, Estimated: 51 mL/min — ABNORMAL LOW (ref >60)
Glucose, Bld: 99 mg/dL (ref 70–99)
Potassium: 4.1 mmol/L (ref 3.5–5.1)
Sodium: 133 mmol/L — ABNORMAL LOW (ref 135–145)
Total Bilirubin: 1 mg/dL (ref 0.3–1.2)
Total Protein: 6.4 g/dL — ABNORMAL LOW (ref 6.5–8.1)

## 2020-09-30 LAB — PHOSPHORUS: Phosphorus: 4.5 mg/dL (ref 2.5–4.6)

## 2020-09-30 LAB — PROCALCITONIN: Procalcitonin: 0.87 ng/mL

## 2020-09-30 LAB — CREATININE, SERUM
Creatinine, Ser: 1.19 mg/dL — ABNORMAL HIGH (ref 0.44–1.00)
GFR, Estimated: 51 mL/min — ABNORMAL LOW (ref >60)

## 2020-09-30 LAB — PROTEIN AND GLUCOSE, CSF
Glucose, CSF: 72 mg/dL — ABNORMAL HIGH (ref 40–70)
Total  Protein, CSF: 36 mg/dL (ref 15–45)

## 2020-09-30 LAB — MAGNESIUM: Magnesium: 2.2 mg/dL (ref 1.7–2.4)

## 2020-09-30 LAB — AMMONIA: Ammonia: 14 umol/L (ref 9–35)

## 2020-09-30 MED ORDER — SODIUM CHLORIDE 0.9 % IV SOLN
750.0000 mg | Freq: Two times a day (BID) | INTRAVENOUS | Status: DC
  Administered 2020-09-30 – 2020-10-04 (×8): 750 mg via INTRAVENOUS
  Filled 2020-09-30 (×9): qty 7.5

## 2020-09-30 MED ORDER — SODIUM CHLORIDE 0.9 % IV SOLN: INTRAVENOUS | Status: AC

## 2020-09-30 MED ORDER — CHLORHEXIDINE GLUCONATE 0.12% ORAL RINSE (MEDLINE KIT)
15.0000 mL | Freq: Two times a day (BID) | OROMUCOSAL | Status: DC
  Administered 2020-09-30 – 2020-10-11 (×20): 15 mL via OROMUCOSAL

## 2020-09-30 MED ORDER — LABETALOL HCL 5 MG/ML IV SOLN
10.0000 mg | Freq: Four times a day (QID) | INTRAVENOUS | Status: DC
  Administered 2020-09-30 – 2020-10-01 (×4): 10 mg via INTRAVENOUS
  Filled 2020-09-30 (×4): qty 4

## 2020-09-30 MED ORDER — POLYETHYLENE GLYCOL 3350 17 G PO PACK: 17.0000 g | PACK | Freq: Every day | ORAL | Status: DC | PRN

## 2020-09-30 MED ORDER — INSULIN ASPART 100 UNIT/ML ~~LOC~~ SOLN
0.0000 [IU] | SUBCUTANEOUS | Status: DC
  Administered 2020-10-01 (×2): 1 [IU] via SUBCUTANEOUS
  Administered 2020-10-01: 2 [IU] via SUBCUTANEOUS
  Administered 2020-10-01 – 2020-10-02 (×5): 1 [IU] via SUBCUTANEOUS
  Administered 2020-10-02: 2 [IU] via SUBCUTANEOUS
  Administered 2020-10-03: 1 [IU] via SUBCUTANEOUS
  Administered 2020-10-03: 2 [IU] via SUBCUTANEOUS
  Administered 2020-10-04: 1 [IU] via SUBCUTANEOUS
  Administered 2020-10-04: 2 [IU] via SUBCUTANEOUS
  Administered 2020-10-04: 1 [IU] via SUBCUTANEOUS
  Administered 2020-10-04: 2 [IU] via SUBCUTANEOUS
  Administered 2020-10-05 (×2): 1 [IU] via SUBCUTANEOUS

## 2020-09-30 MED ORDER — DOCUSATE SODIUM 100 MG PO CAPS: 100.0000 mg | ORAL_CAPSULE | Freq: Two times a day (BID) | ORAL | Status: DC | PRN

## 2020-09-30 MED ORDER — ORAL CARE MOUTH RINSE
15.0000 mL | OROMUCOSAL | Status: DC
  Administered 2020-09-30 – 2020-10-04 (×40): 15 mL via OROMUCOSAL

## 2020-09-30 MED ORDER — DEXTROSE 5 % IV SOLN
10.0000 mg/kg | Freq: Three times a day (TID) | INTRAVENOUS | Status: DC
  Administered 2020-09-30 – 2020-10-03 (×8): 545 mg via INTRAVENOUS
  Filled 2020-09-30 (×10): qty 10.9

## 2020-09-30 MED ORDER — ENOXAPARIN SODIUM 40 MG/0.4ML ~~LOC~~ SOLN
40.0000 mg | SUBCUTANEOUS | Status: DC
  Administered 2020-09-30 – 2020-10-05 (×6): 40 mg via SUBCUTANEOUS
  Filled 2020-09-30 (×6): qty 0.4

## 2020-09-30 MED ORDER — CHLORHEXIDINE GLUCONATE CLOTH 2 % EX PADS
6.0000 | MEDICATED_PAD | Freq: Every day | CUTANEOUS | Status: DC
  Administered 2020-10-01 – 2020-10-06 (×6): 6 via TOPICAL

## 2020-09-30 MED ORDER — METHADONE HCL 5 MG/5ML PO SOLN
70.0000 mg | Freq: Every day | ORAL | Status: DC
  Filled 2020-09-30: qty 500

## 2020-09-30 MED ORDER — LACTATED RINGERS IV BOLUS
1000.0000 mL | Freq: Once | INTRAVENOUS | Status: AC
  Administered 2020-09-30: 1000 mL via INTRAVENOUS

## 2020-09-30 MED ORDER — HYDROMORPHONE HCL 2 MG PO TABS
2.0000 mg | ORAL_TABLET | ORAL | Status: DC | PRN
  Administered 2020-10-01 (×2): 2 mg
  Filled 2020-09-30 (×2): qty 1

## 2020-09-30 MED ORDER — METHADONE HCL 10 MG/ML PO CONC
70.0000 mg | Freq: Every day | ORAL | Status: DC
  Filled 2020-09-30: qty 7

## 2020-09-30 MED ORDER — DEXTROSE 5 % IV SOLN
10.0000 mg/kg | Freq: Two times a day (BID) | INTRAVENOUS | Status: DC
  Administered 2020-09-30: 545 mg via INTRAVENOUS
  Filled 2020-09-30 (×2): qty 10.9

## 2020-09-30 MED ORDER — MORPHINE SULFATE (PF) 2 MG/ML IV SOLN
2.0000 mg | INTRAVENOUS | Status: AC
  Administered 2020-09-30: 2 mg via INTRAVENOUS
  Filled 2020-09-30: qty 1

## 2020-09-30 MED ORDER — LORAZEPAM 2 MG/ML IJ SOLN
2.0000 mg | INTRAMUSCULAR | Status: AC
  Administered 2020-09-30: 2 mg via INTRAVENOUS
  Filled 2020-09-30: qty 1

## 2020-09-30 MED ORDER — AMLODIPINE BESYLATE 10 MG PO TABS
10.0000 mg | ORAL_TABLET | Freq: Every day | ORAL | Status: DC
  Administered 2020-09-30 – 2020-10-02 (×3): 10 mg
  Filled 2020-09-30 (×3): qty 1

## 2020-09-30 NOTE — Progress Notes (Signed)
Mobile at bedside to transport patient to [facility]. EMTALA and Medical necessity forms printed and placed in packet with demo sheet and chart information, given to mobile team.

## 2020-09-30 NOTE — Progress Notes (Signed)
LTM EEG in process, result pending

## 2020-09-30 NOTE — Consult Note (Signed)
Pharmacy Antibiotic Note  Natalie [name] is a 65 y.o. female admitted on 09/28/2020 with possible aspiration PNA.  Pharmacy has been consulted for cefepime dosing.  Plan: Cefepime 2 g IV q12h Pt is also ordered metronidazole 500 mg IV q8h  Monitor clinical picture and renal function F/U C&S, abx deescalation / LOT  3/27  PCT 1.42>0.87   Height: 5\' 3"  (160 cm) Weight: 54.4 kg (120 lb) IBW/kg (Calculated) : 52.4  Temp (24hrs), Avg:96.9 F (36.1 C), Min:96.4 F (35.8 C), Max:97.7 F (36.5 C)  Recent Labs  Lab 09/28/20 1347 09/29/20 0430 09/30/20 0420  WBC 5.0 12.4* 9.4  CREATININE 1.12*   1.24* 1.20* 1.20*    Estimated Creatinine Clearance: 39.7 mL/min (A) (by C-G formula based on SCr of 1.2 mg/dL (H)).    Allergies  Allergen Reactions   Sulfasalazine Shortness Of Breath   Aspirin    Darvon [Propoxyphene]    Iodine    Penicillins    Sulfa Antibiotics    Zithromax [Azithromycin]     Antimicrobials this admission: 3/25 cefepime >>  3/25 metronidazole >>   Dose adjustments this admission:   Microbiology results: 3/25 Resp panel: neg 3/26 trach aspirate: GPR 3/26 MRSA PCR neg.  Thank you for allowing pharmacy to be a part of this patients care.  Anum Palecek A, PharmD 09/30/2020 12:07 PM

## 2020-09-30 NOTE — H&P (Signed)
NAME:  Natalie [name], MRN:  [id number], DOB:  [birth year], LOS: 0 ADMISSION DATE:  09/30/2020, CONSULTATION DATE: 09/30/2020 REFERRING MD: Dr. Patsey Berthold, CHIEF COMPLAINT: Altered mental status  History of Present Illness:  65 year old female with COPD and CKD stage IIIa who was referred from [facility] for evaluation of altered mental status. Patient is encephalopathic so most of the history is taken from chart review  Patient presented to [facility] emergency department 2 days ago with altered mental status, after she was found confused and aphasic covered in stool. Patient had extensive work-up at [facility] to denote the cause of her altered mental status, so far it has been negative, she had CT head, MRI head which showed chronic changes. LP was done today, which showed 5 WBCs and 42% of lymphocyte, she was started empirically on acyclovir. Patient was noted to have upper extremity posturing intermittently, there was suspicion of a status epilepticus, she was transferred to [facility] for continuous EEG monitoring Pertinent  Medical History   Past Medical History:  Diagnosis Date  . Cataracts, bilateral   . Chickenpox cataracts  . COPD (chronic obstructive pulmonary disease) ([place])   . Depression   . Drug addiction ([place])   . Headache   . Hepatitis C virus infection   . Hepatitis C without hepatic coma   . History of chicken pox   . History of colon polyps   . Seizures ([place])      Significant Hospital Events: Including procedures, antibiotic start and stop dates in addition to other pertinent events   . 3/27 transfer to [facility]  Interim History / Subjective:    Objective   Blood pressure (!) 156/95, pulse 96, resp. rate 18, SpO2 100 %.    Vent Mode: PRVC FiO2 (%):  [40 %-50 %] 40 % Set Rate:  [15 bmp-18 bmp] 18 bmp Vt Set:  [420 mL-450 mL] 420 mL PEEP:  [5 cmH20] 5 cmH20 Plateau Pressure:  [10 cmH20-14 cmH20] 12 cmH20  No intake or output data in the 24 hours ending 09/30/20  1828 There were no vitals filed for this visit.  Examination: Physical exam: General: Acutely ill-appearing female, orally intubated HEENT: [place]/AT, eyes anicteric.  ETT and OGT in place Neuro: Eyes closed, downward gaze deviation, pupils are equal round reactive to light, bilateral upper extremity posturing with noxious stimuli and spontaneous, withdrawing to pain in bilateral lower extremities Chest: Coarse breath sounds, no wheezes or rhonchi Heart: Regular rate and rhythm, no murmurs or gallops Abdomen: Soft, nontender, nondistended, bowel sounds present Skin: No rash   Labs/imaging that I havepersonally reviewed  (right click and "Reselect all SmartList Selections" daily)  CSF glucose 72, WBC count 5, 42 lymphs  CT head without contrast 3/27: No acute intracranial abnormality or significant change since MRI brain 3/26: No acute infarction, hemorrhage, or mass.  Nonspecific white matter disease that may reflect age advanced chronic microvascular ischemic changes.  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  Resolved Hospital Problem list     Assessment & Plan:  Acute encephalopathy of unknown significance with a differential being a status epilepticus, viral encephalitis, drug induced, PRES versus hepatic encephalopathy as patient has history of hep C Acute hypoxic respiratory failure CKD stage IIIa  Patient presented with altered mental status, at [facility] in the emergency department she was noted to have seizure-like activity, the cause of encephalopathy is broad in her case, which is outlined above Follow-up HSV PCR and CSF culture Continuous EEG monitoring  to rule out a status epilepticus Continue Keppra 750 mg twice daily Appreciate neurology input We will check serum ammonia level Blood pressure is elevated, I started on labetalol 10 mg every 6 hours Started on amlodipine 10 mg once daily MRI brain did not show changes consistent with PRES Continue  lung protective mechanical ventilation Monitor serum creatinine  Best practice (right click and "Reselect all SmartList Selections" daily)  Diet:  Tube Feed  Pain/Anxiety/Delirium protocol (if indicated): Yes (RASS goal -1) VAP protocol (if indicated): Yes DVT prophylaxis: LMWH GI prophylaxis: N/A Glucose control:  SSI Yes Central venous access:  N/A Arterial line:  N/A Foley:  Yes, and it is still needed Mobility:  bed rest  PT consulted: N/A Last date of multidisciplinary goals of care discussion [pending] Code Status:  full code Disposition: ICU  Labs   CBC: Recent Labs  Lab 09/28/20 1347 09/29/20 0430 09/30/20 0420  WBC 5.0 12.4* 9.4  HGB 13.3 13.5 12.4  HCT 41.8 40.0 37.3  MCV 93.7 90.9 92.6  PLT 171 206 889    Basic Metabolic Panel: Recent Labs  Lab 09/28/20 1347 09/29/20 0430 09/29/20 1541 09/30/20 0420  NA 137  137 136  --  133*  K 3.7  3.6 3.0* 3.9 4.1  CL 100  100 100  --  102  CO2 27  28 27   --  23  GLUCOSE 114*  122* 123*  --  99  BUN 11  11 15   --  19  CREATININE 1.12*  1.24* 1.20*  --  1.20*  CALCIUM 9.2  9.4 8.9  --  9.2  MG 2.0 2.2  --  2.2  PHOS  --  4.1  --  4.5   GFR: Estimated Creatinine Clearance: 39.7 mL/min (A) (by C-G formula based on SCr of 1.2 mg/dL (H)). Recent Labs  Lab 09/28/20 1347 09/29/20 0430 09/30/20 0420  PROCALCITON <0.10 1.42 0.87  WBC 5.0 12.4* 9.4    Liver Function Tests: Recent Labs  Lab 09/28/20 1347 09/29/20 0430 09/30/20 0420  AST 22  21 45* 41  ALT 14  14 16 17   ALKPHOS 99  105 87 71  BILITOT 1.3*  1.4* 1.8* 1.0  PROT 7.3  7.4 6.7 6.4*  ALBUMIN 4.2  4.4 3.8 3.3*   No results for input(s): LIPASE, AMYLASE in the last 168 hours. Recent Labs  Lab 09/28/20 1347 09/29/20 0430  AMMONIA 17 22    ABG    Component Value Date/Time   PHART 7.48 (H) 09/28/2020 2342   PCO2ART 35 09/28/2020 2342   PO2ART 422 (H) 09/28/2020 2342   HCO3 26.1 09/28/2020 2342   ACIDBASEDEF 0.4 09/28/2020  2201   O2SAT 100.0 09/28/2020 2342     Coagulation Profile: No results for input(s): INR, PROTIME in the last 168 hours.  Cardiac Enzymes: Recent Labs  Lab 09/28/20 1347  CKTOTAL 215    HbA1C: Hgb A1c MFr Bld  Date/Time Value Ref Range Status  09/28/2020 01:47 PM 5.9 (H) 4.8 - 5.6 % Final    Comment:    (NOTE) Pre diabetes:          5.7%-6.4%  Diabetes:              >6.4%  Glycemic control for   <7.0% adults with diabetes     CBG: Recent Labs  Lab 09/29/20 2324 09/30/20 0401 09/30/20 0734 09/30/20 1121 09/30/20 1539  GLUCAP 132* 107* 97 121* 103*    Review of Systems:  Unable to obtain review of system as patient is intubated, encephalopathic  Past Medical History:  She,  has a past medical history of Cataracts, bilateral, Chickenpox (cataracts), COPD (chronic obstructive pulmonary disease) ([place]), Depression, Drug addiction ([place]), Headache, Hepatitis C virus infection, Hepatitis C without hepatic coma, History of chicken pox, History of colon polyps, and Seizures ([place]).   Surgical History:   Past Surgical History:  Procedure Laterality Date  . APPENDECTOMY    . CHOLECYSTECTOMY    . COLONOSCOPY WITH PROPOFOL N/A 04/30/2015   Procedure: COLONOSCOPY WITH PROPOFOL;  Surgeon: Josefine Class, MD;  Location: [facility] ENDOSCOPY;  Service: Endoscopy;  Laterality: N/A;  . COLONOSCOPY WITH PROPOFOL N/A 07/13/2017   Procedure: COLONOSCOPY WITH PROPOFOL;  Surgeon: Manya Silvas, MD;  Location: [facility] ENDOSCOPY;  Service: Endoscopy;  Laterality: N/A;  . ESOPHAGOGASTRODUODENOSCOPY (EGD) WITH PROPOFOL N/A 07/13/2017   Procedure: ESOPHAGOGASTRODUODENOSCOPY (EGD) WITH PROPOFOL;  Surgeon: Manya Silvas, MD;  Location: [facility] ENDOSCOPY;  Service: Endoscopy;  Laterality: N/A;  . EYE SURGERY       Social History:   reports that she has been smoking cigarettes. She has a 20.50 pack-year smoking history. She has never used smokeless tobacco. She reports that she does not drink  alcohol and does not use drugs.   Family History:  Her family history includes Breast cancer in her paternal grandmother.   Allergies Allergies  Allergen Reactions  . Sulfasalazine Shortness Of Breath  . Aspirin   . Darvon [Propoxyphene]   . Iodine   . Penicillins   . Sulfa Antibiotics   . Zithromax [Azithromycin]      Home Medications  Prior to Admission medications   Not on File     Total critical care time: 42 minutes  Performed by: [place] care time was exclusive of separately billable procedures and treating other patients.   Critical care was necessary to treat or prevent imminent or life-threatening deterioration.   Critical care was time spent personally by me on the following activities: development of treatment plan with patient and/or surrogate as well as nursing, discussions with consultants, evaluation of patient's response to treatment, examination of patient, obtaining history from patient or surrogate, ordering and performing treatments and interventions, ordering and review of laboratory studies, ordering and review of radiographic studies, pulse oximetry and re-evaluation of patient's condition.   Jacky Kindle MD [facility] See Amion for pager If no response to pager, please call [phone] until 7pm After 7pm, Please call E-link [phone]

## 2020-09-30 NOTE — Procedures (Signed)
Indication: Altered Mental Status  Risks of the procedure were dicussed with the patient including post-LP headache, bleeding, infection, weakness/numbness of legs(radiculopathy), death.  The patient/patient's proxy agreed and written consent was obtained.   The patient was prepped and draped, and using sterile technique a 20 gauge quinke spinal needle was inserted in the L4-5 space. Despite repeated attempts, bony resistance was met. After repostioning to the L5-S1 space, on the second pass, tinged CSF taht quickly cleared was obtained. The opening pressure was 13 cm H2O. Approximately 17 cc of CSF were obtained and sent for analysis.   Roland Rack, MD [facility] [phone]  If 7pm- 7am, please page neurology on call as listed in [place].

## 2020-09-30 NOTE — Consult Note (Signed)
Neurology Consultation  Reason for Consult: Seizures Referring Physician: Dr. Tacy Learn  CC: Seizures  History is obtained from: Chart review  HPI: Natalie [name] is a 64 y.o. female remote history of seizures, COPD, hepatitis C, opiate dependence, presented to [facility] with altered mental status followed by seizure activity.  She was found to be altered and aphasic, covered in her stools on 09/28/2020.  She is on methadone for 20 years.  She remained encephalopathic throughout her course at [facility] and was admitted to the ICU after rapid response was called when she was having full body tonic-clonic seizure activity with secretions of saliva from her mouth, was given 4 mg of Ativan which did not stop.  She was emergently intubated and started on antiepileptics.  Her blood pressures were also very high.  Suspicion for posterior reversible encephalopathy syndrome, hypertensive emergency provoking seizures was done and seizure stopped with Keppra.  She was continued on Keppra.  EEG was done which was suggestive of moderate to severe diffuse encephalopathy with generalized periodic discharges with triphasic morphology and discontinuous disorganized background. She did not improve over the next day and was sent over to [facility] for LTM monitoring.  Of note, attending neurologist Dr. Leonel Ramsay at [facility], also did a spinal tap which was unremarkable for acute signs of infection.  HSV PCR is pending.  She is also started on opiates to prevent withdrawal.  MRI brain with no changes consistent with PR ES.  No strokes.  No bleeds.   ROS: Unable to ascertain due to her altered mentation and intubation  Past Medical History:  Diagnosis Date  . Cataracts, bilateral   . Chickenpox cataracts  . COPD (chronic obstructive pulmonary disease) ([place])   . Depression   . Drug addiction ([place])   . Headache   . Hepatitis C virus infection   . Hepatitis C without  hepatic coma   . History of chicken pox   . History of colon polyps   . Seizures ([place])     Family History  Problem Relation Age of Onset  . Breast cancer Paternal Grandmother       Social History:   reports that she has been smoking cigarettes. She has a 20.50 pack-year smoking history. She has never used smokeless tobacco. She reports that she does not drink alcohol and does not use drugs.   Medications  Current Facility-Administered Medications:  .  acyclovir (ZOVIRAX) 545 mg in dextrose 5 % 100 mL IVPB, 10 mg/kg, Intravenous, Q8H, Chand, [place], MD .  amLODipine (NORVASC) tablet 10 mg, 10 mg, Per Tube, Daily, Chand, Sudham, MD .  chlorhexidine gluconate (MEDLINE KIT) (PERIDEX) 0.12 % solution 15 mL, 15 mL, Mouth Rinse, BID, Jacky Kindle, MD .  Derrill Memo ON 10/01/2020] Chlorhexidine Gluconate Cloth 2 % PADS 6 each, 6 each, Topical, Q0600, Jacky Kindle, MD .  docusate sodium (COLACE) capsule 100 mg, 100 mg, Oral, BID PRN, Jacky Kindle, MD .  enoxaparin (LOVENOX) injection 40 mg, 40 mg, Subcutaneous, Q24H, Chand, Sudham, MD .  HYDROmorphone (DILAUDID) tablet 2 mg, 2 mg, Per Tube, Q4H PRN, Tacy Learn, [place]ham, MD .  insulin aspart (novoLOG) injection 0-9 Units, 0-9 Units, Subcutaneous, Q4H, Chand, Sudham, MD .  labetalol (NORMODYNE) injection 10 mg, 10 mg, Intravenous, Q6H, Chand, Sudham, MD .  levETIRAcetam (KEPPRA) 750 mg in sodium chloride 0.9 % 100 mL IVPB, 750 mg, Intravenous, Q12H, Chand, Sudham, MD .  MEDLINE mouth rinse, 15 mL, Mouth Rinse, 10 times per  day, Jacky Kindle, MD .  polyethylene glycol (MIRALAX / GLYCOLAX) packet 17 g, 17 g, Oral, Daily PRN, Jacky Kindle, MD    Exam: Current vital signs: BP (!) 156/95 (BP Location: Left Arm)   Pulse 92   Temp 99.1 F (37.3 C) (Axillary)   Resp 19   SpO2 100%  Vital signs in last 24 hours: Temp:  [96.44 F (35.8 C)-99.1 F (37.3 C)] 99.1 F (37.3 C) (03/27 2000) Pulse Rate:  [73-142] 92 (03/27 1916) Resp:  [15-38] 19  (03/27 1916) BP: (113-270)/(70-236) 156/95 (03/27 1810) SpO2:  [98 %-100 %] 100 % (03/27 1916) FiO2 (%):  [40 %-50 %] 40 % (03/27 1916) General: Intubated, on no sedation-was on propofol which has now been off for while. HEENT: EEG leads on the head, ET tube in place. CVS: Regular rate rhythm Respiratory: Vented Abdomen nondistended nontender Neurological exam Intubated, no sedation No spontaneous movement Breathing over the ventilator Does not open eyes to voice or follow any commands. Cranial: Pupils equal round reactive light, initially did appear that she had some rightward gaze but upon repeat exam she was midline, does not blink to threat from either side, facial symmetry difficult to ascertain.,  Breathing over the ventilator Motor exam: No spontaneous movement To noxious stimulation strongly withdraws bilateral lower extremities.  To noxious stimulation in the upper extremities, there was an initial extensor response followed by withdrawal. Sensory exam: As above   Labs I have reviewed labs in epic and the results pertinent to this consultation are: CSF, 5 WBCs, protein 36, glucose 72.  CBC    Component Value Date/Time   WBC 9.2 09/30/2020 1848   RBC 3.93 09/30/2020 1848   HGB 12.4 09/30/2020 1848   HGB 11.4 (L) 05/09/2012 0748   HCT 36.7 09/30/2020 1848   HCT 34.3 (L) 05/09/2012 0748   PLT 159 09/30/2020 1848   PLT 235 05/09/2012 0748   MCV 93.4 09/30/2020 1848   MCV 98 05/09/2012 0748   MCH 31.6 09/30/2020 1848   MCHC 33.8 09/30/2020 1848   RDW 15.0 09/30/2020 1848   RDW 14.6 (H) 05/09/2012 0748   LYMPHSABS 0.7 (L) 07/24/2017 2140   LYMPHSABS 1.5 05/09/2012 0748   MONOABS 0.7 07/24/2017 2140   MONOABS 0.8 05/09/2012 0748   EOSABS 0.0 07/24/2017 2140   EOSABS 0.0 05/09/2012 0748   BASOSABS 0.0 07/24/2017 2140   BASOSABS 0.0 05/09/2012 0748    CMP     Component Value Date/Time   NA 133 (L) 09/30/2020 0420   NA 137 05/09/2012 0748   K 4.1 09/30/2020 0420    K 2.9 (L) 05/09/2012 0748   CL 102 09/30/2020 0420   CL 99 05/09/2012 0748   CO2 23 09/30/2020 0420   CO2 32 05/09/2012 0748   GLUCOSE 99 09/30/2020 0420   GLUCOSE 108 (H) 05/09/2012 0748   BUN 19 09/30/2020 0420   BUN 11 05/09/2012 0748   CREATININE 1.19 (H) 09/30/2020 1848   CREATININE 0.64 05/09/2012 0748   CALCIUM 9.2 09/30/2020 0420   CALCIUM 9.1 05/09/2012 0748   PROT 6.4 (L) 09/30/2020 0420   PROT 7.1 05/01/2012 0823   ALBUMIN 3.3 (L) 09/30/2020 0420   ALBUMIN 3.9 05/01/2012 0823   AST 41 09/30/2020 0420   AST 13 (L) 05/01/2012 0823   ALT 17 09/30/2020 0420   ALT 21 05/01/2012 0823   ALKPHOS 71 09/30/2020 0420   ALKPHOS 173 (H) 05/01/2012 0823   BILITOT 1.0 09/30/2020 0420   BILITOT 1.4 (H)  05/01/2012 0823   GFRNONAA 51 (L) 09/30/2020 1848   GFRNONAA >60 05/09/2012 0748   GFRAA >60 07/25/2017 0444   GFRAA >60 05/09/2012 0748   Ammonia was 22 on 326, it is 14 today.  Toxicology was positive for benzodiazepines and methadone.  Imaging I have reviewed the images obtained: MRI of the brain not consistent with posterior reversible encephalopathy syndrome.  No acute infarction.  No mass.  Nonspecific white matter disease.  Age advanced chronic microvascular ischemic changes.  Chronic infarcts in the left thalamus and identified mucus.  Assessment: 65 year old with history of narcotic abuse, hep C, remote history of seizures presenting with altered mental status followed by seizure activity requiring her to be intubated. Also was noted to be extremely hypertensive. Remains encephalopathic although pressures have been starting to normalize and she is on antiepileptics. MRI of the brain not consistent with posterior reversible encephalopathy syndrome. CNS infection-bacterial unlikely given the CSF findings.  HSV encephalitis remains in the differentials but with normal protein and 5 cells, I think that would also come back negative. Situation surrounding her altered  mentation is not clear-on one hand there is documentation that she was found in her stools and on the other read said that confusion started while caring for a child.  She also has had a loss of her long-term partner recently so consideration for intentional overdose also remains  The clinical picture currently is of toxic metabolic encephalopathy versus resolved status epilepticus with prolonged postictal state with posterior reversible encephalopathy syndrome less likely and CNS infection also less likely.  Continuous EEG will help figure out how she does overnight and the further course of action  Recommendations: LTM EEG Continue her opiates to avoid withdrawal Continue acyclovir till HSV PCR on CSF comes back negative Continue Keppra 750 twice daily Sedation as needed for vent synchrony. Start sedation holiday starting in the morning to get better exams. Neurology will continue to follow with you.  -- Amie Portland, MD Neurologist [facility] Pager: [phone]   [place] Performed by: Amie Portland, MD Total critical care time: 31 minutes Critical care time was exclusive of separately billable procedures and treating other patients and/or supervising APPs/Residents/Students Critical care was necessary to treat or prevent imminent or life-threatening deterioration due to status epilepticus, toxic metabolic encephalopathy This patient is critically ill and at significant risk for neurological worsening and/or death and care requires constant monitoring. Critical care was time spent personally by me on the following activities: development of treatment plan with patient and/or surrogate as well as nursing, discussions with consultants, evaluation of patient's response to treatment, examination of patient, obtaining history from patient or surrogate, ordering and performing treatments and interventions, ordering and review of laboratory studies, ordering and  review of radiographic studies, pulse oximetry, re-evaluation of patient's condition, participation in multidisciplinary rounds and medical decision making of high complexity in the care of this patient.

## 2020-09-30 NOTE — Progress Notes (Signed)
Patient remains on vent and stable for transport.

## 2020-09-30 NOTE — Progress Notes (Signed)
Propofol drip paused by Leonel Ramsay, MD for neuro exam at approx 11am. Patient became tachycardic in the 150s at approx 1145; propofol restarted at 38mcg/kg/min, verbal order received from Dr. Leonel Ramsay for morphine and ativan. Verbal order received from Dr. Patsey Berthold for 1L bolus of LR. Morphine and LR administered, will give ativan when Dr. Leonel Ramsay comes back to bedside for LP procedure per his direction.

## 2020-09-30 NOTE — Progress Notes (Signed)
NAME:  Natalie [name], MRN:  [id number], DOB:  [birth year], LOS: 1 ADMISSION DATE:  09/28/2020, CONSULTATION DATE: 09/28/2020 REFERRING MD: Sharion Settler, NP, CHIEF COMPLAINT: Altered mental status  History of Present Illness:  65 year old female admitted to telemetry due to acute encephalopathy with unknown LKW time.  Per ED documentation & family interview, the patient's neighbor discovered her altered and aphasic covered in stool on 09/28/2020.  Per the patient's children, sister and husband she has been prescribed methadone daily for a " long time, possibly 20 years".  They state the patient has been taking this faithfully and was allowed to get take-home methadone packets from the clinic because of this.  They denied noticing any alcohol intake or recreational drug use.  Of note her significant other over the past 22 years died suddenly in his sleep in 13-Sep-2020.  Since then she has been noted to be falling at home intermittently, it is unknown if she had a loss of consciousness or not and the patient has not sought out medical attention after these falls. ED course: CT head negative for acute intracranial abnormality but does show chronic lacunar infarct.  UDS positive for benzodiazepines which were given in the emergency room as well as methadone which the patient takes chronically.  Acetaminophen and salicylate levels negative & serum alcohol level negative. Initial vitals: Tachypneic at 24, tachycardic at 106, hypertensive at 179/93 with SPO2 99% on room air. [facility] hospital[facility] service was consulted for admission to telemetry unit, sedating medications were held as patient appeared somnolent but stable with MRI pending.  Rapid response called upon patient's admission to telemetry.  Upon bedside arrival, patient was having full body convulsive tonic-clonic seizure like activity with secretions foam only a yes at the mouth that did not stop with 4 mg of Ativan, 5 mg of metoprolol & 2 mg of  morphine.  Patient was tachycardic in the 190's, tachypneic in the 50's gasping.  CODE BLUE was called for emergent intubation by ED physician due to continuing convulsive seizures. Patient did appear to become post ictal after more than 5 minutes, but was intubated for airway protection due to ongoing somnolence.  PCCM consulted for transfer to ICU.  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 09/28/2020-patient admitted due to altered mental status, upon arrival to 2A patient in status epilepticus ultimately requiring emergent intubation and mechanical ventilation with transfer to ICU. . 09/30/2020-persistent encephalopathy, remains intubated, some posturing noted.  Patient underwent spinal tap per neurology.  Initiated empiric acyclovir.  Resumed half dose of methadone to prevent withdrawal.  Interim History / Subjective:  Unresponsive on vent occasional posturing noted.  Objective   Blood pressure 139/83, pulse 73, temperature (!) 96.44 F (35.8 C), resp. rate 15, height $RemoveBe'5\' 3"'EYVZmTjow$  (1.6 m), weight 54.4 kg, SpO2 100 %.    Vent Mode: PRVC FiO2 (%):  [40 %-50 %] 40 % Set Rate:  [15 bmp] 15 bmp Vt Set:  [450 mL] 450 mL PEEP:  [5 cmH20] 5 cmH20 Plateau Pressure:  [10 cmH20-12 cmH20] 10 cmH20   Intake/Output Summary (Last 24 hours) at 09/30/2020 1000 Last data filed at 09/30/2020 0604 Gross per 24 hour  Intake 962.68 ml  Output 805 ml  Net 157.68 ml   Filed Weights   09/28/20 1326  Weight: 54.4 kg    Examination: (Upon arrival to ICU) General: Adult female, acute on chronically ill appearing, lying in bed intubated & sedated requiring mechanical ventilation  HEENT: MM  pink/moist, anicteric, atraumatic, neck supple Neuro: Unresponsive, unable to follow commands, PERRL +4, MAE CV: s1s2 RRR, sinus tachycardia on monitor, no r/m/g Pulm: Regular, non labored on PRVC at 100% FiO2, breath sounds diminished throughout GI: soft, flat, bs x 4 Skin: Scattered abrasions and ecchymosis from PTA falls Extremities: warm/dry, pulses + 2 R/P, no edema noted  Labs/imaging that I havepersonally reviewed  (right click and "Reselect all SmartList Selections" daily)  09/28/2020 CT head without contrast >> no acute intracranial abnormality, focal hypodensity left thalamus consistent with chronic lacunar infarct new since 2017. 09/28/2020 CT cervical spine without contrast >> no acute cervical spine fracture, multilevel spondylosis and facet hypertrophy. EKG Interpretation Date:  09/28/2020 EKG Time:  15:14 Rate: 106 Rhythm: Sinus Tachycardia QRS Axis:  normal Intervals: prolonged QT/QTc- 410/545 Narrative Interpretation: ST with QT/QTc prolongation  Repeat stat CT head 3/27 pending.  Resolved Hospital Problem list   N/AA  Assessment & Plan:  Acute Hypoxic Respiratory Failure in the setting of status epilepticus for airway protection PMHx: COPD on chronic O2 outpatient - Continue ventilator support & lung protective strategies - Wean PEEP & FiO2 as tolerated, maintain SpO2 > 90% - Head of bed elevated 30 degrees, VAP protocol in place - Plateau pressures less than 30 cm H20  - Intermittent chest x-ray & ABG PRN - Daily WUA with SBT as tolerated  - Ensure adequate pulmonary hygiene  - F/u cultures, trend PCT - Started Aspiration PNA coverage: cefepime & flagyl however, Flagyl discontinued due to persistent encephalopathy - Continue: solu-medrol 40 mg BID  - Budesonide nebs BID, bronchodilators - PAD protocol in place: continue Fentanyl IVP & Propofol drip, with versed IVP  Status Epilepticus PMHx: seizures (not on medications outpatient) - STAT repeat head CT -  Neurology following, Dr. Leonel Ramsay - Will need continuous EEG - LP done today, pending - Empiric acyclovir for potential encephalitis - MRI without acute finding, stat head CT pending - Continue Keppra, 750 mg BID to follow due to renal function - benzodiazepines PRN for seizure activity  Acute Encephalopathy PMHx: chronic methadone use, substance use disorder, Hep C Initial head CT wo contrast negative for acute intracranial abnormality, MRI pending. Liver enzymes & ammonia WNL - Resume half of chronic dose to prevent withdrawal - supportive care  CKD stage IIIb . Lab Results  Component Value Date   CREATININE 1.20 (H) 09/30/2020   BUN 19 09/30/2020   NA 133 (L) 09/30/2020   K 4.1 09/30/2020   CL 102 09/30/2020   CO2 23 09/30/2020    Baseline Cr: 1.44, Cr on admission:1.24 - Strict I/O's: alert provider if UOP < 0.5 mL/kg/hr - Daily BMP, replace electrolytes PRN - Avoid nephrotoxic agents as able, ensure adequate renal perfusion - Consult nephrology if iHD or CRRT indicated   HFrEF- (LVEF 40-45% from echo 2015) Patient not currently on CHF medications outpatient - continuous cardiac monitoring - Echocardiogram ordered, pending - conservative IVF  Hyperglycemia - CBG monitoring Q 4 h, with SSI - Hemoglobin A1C pending - follow ICU hyper/hypo-glycemia protocol  Best practice (right click and "Reselect all SmartList Selections" daily)  Diet:  NPO Pain/Anxiety/Delirium protocol (if indicated): Yes (RASS goal -1,-2) VAP protocol (if indicated): Yes DVT prophylaxis:  Subcutaneous Heparin GI prophylaxis: H2B Glucose control:  SSI Yes Central venous access:  Placing emergently Arterial line:  N/A Foley:  Yes, and it is still needed Mobility:  bed rest  PT consulted: N/A Last date of multidisciplinary goals of care discussion 09/28/20 Code Status:  full code Disposition: ICU  Labs   CBC: Recent Labs  Lab 09/28/20 1347 09/29/20 0430 09/30/20 0420  WBC 5.0  12.4* 9.4  HGB 13.3 13.5 12.4  HCT 41.8 40.0 37.3  MCV 93.7 90.9 92.6  PLT 171 206 503    Basic Metabolic Panel: Recent Labs  Lab 09/28/20 1347 09/29/20 0430 09/29/20 1541 09/30/20 0420  NA 137  137 136  --  133*  K 3.7  3.6 3.0* 3.9 4.1  CL 100  100 100  --  102  CO2 $Re'27  28 27  'oEE$ --  23  GLUCOSE 114*  122* 123*  --  99  BUN $Re'11  11 15  'bJF$ --  19  CREATININE 1.12*  1.24* 1.20*  --  1.20*  CALCIUM 9.2  9.4 8.9  --  9.2  MG 2.0 2.2  --  2.2  PHOS  --  4.1  --  4.5   GFR: Estimated Creatinine Clearance: 39.7 mL/min (A) (by C-G formula based on SCr of 1.2 mg/dL (H)). Recent Labs  Lab 09/28/20 1347 09/29/20 0430 09/30/20 0420  PROCALCITON <0.10 1.42 0.87  WBC 5.0 12.4* 9.4    Liver Function Tests: Recent Labs  Lab 09/28/20 1347 09/29/20 0430 09/30/20 0420  AST 22  21 45* 41  ALT $Re'14  14 16 17  'RUw$ ALKPHOS 99  105 87 71  BILITOT 1.3*  1.4* 1.8* 1.0  PROT 7.3  7.4 6.7 6.4*  ALBUMIN 4.2  4.4 3.8 3.3*   No results for input(s): LIPASE, AMYLASE in the last 168 hours. Recent Labs  Lab 09/28/20 1347 09/29/20 0430  AMMONIA 17 22    ABG    Component Value Date/Time   PHART 7.48 (H) 09/28/2020 2342   PCO2ART 35 09/28/2020 2342   PO2ART 422 (H) 09/28/2020 2342   HCO3 26.1 09/28/2020 2342   ACIDBASEDEF 0.4 09/28/2020 2201   O2SAT 100.0 09/28/2020 2342     Coagulation Profile: No results for input(s): INR, PROTIME in the last 168 hours.  Cardiac Enzymes: Recent Labs  Lab 09/28/20 1347  CKTOTAL 215    HbA1C: Hgb A1c MFr Bld  Date/Time Value Ref Range Status  09/28/2020 01:47 PM 5.9 (H) 4.8 - 5.6 % Final    Comment:    (NOTE) Pre diabetes:          5.7%-6.4%  Diabetes:              >6.4%  Glycemic control for   <7.0% adults with diabetes     CBG: Recent Labs  Lab 09/29/20 1546 09/29/20 2010 09/29/20 2324 09/30/20 0401 09/30/20 0734  GLUCAP 144* 105* 132* 107* 97    Review of Systems:   UTA- patient unresponsive, then intubated &  sedated    Allergies Allergies  Allergen Reactions  . Sulfasalazine Shortness Of Breath  . Aspirin   . Darvon [Propoxyphene]   . Iodine   . Penicillins   . Sulfa Antibiotics   . Zithromax [Azithromycin]      Scheduled Meds: . budesonide (PULMICORT) nebulizer solution  0.25 mg Nebulization BID  . chlorhexidine gluconate (MEDLINE KIT)  15 mL Mouth Rinse BID  . Chlorhexidine Gluconate Cloth  6 each Topical Daily  .  docusate  100 mg Per Tube BID  . insulin aspart  0-15 Units Subcutaneous Q4H  . mouth rinse  15 mL Mouth Rinse 10 times per day  . methadone  70 mg Per Tube Daily  . methylPREDNISolone (SOLU-MEDROL) injection  40 mg Intravenous Q12H  . polyethylene glycol  17 g Per Tube Daily  . sodium chloride flush  3 mL Intravenous Q12H   Continuous Infusions: . sodium chloride Stopped (09/30/20 1150)  . acyclovir    . ceFEPime (MAXIPIME) IV Stopped (09/30/20 1006)  . famotidine (PEPCID) IV Stopped (09/30/20 1109)  . levETIRAcetam Stopped (09/30/20 0947)  . norepinephrine (LEVOPHED) Adult infusion Stopped (09/29/20 1216)  . propofol (DIPRIVAN) infusion 50 mcg/kg/min (09/30/20 1351)  . thiamine injection Stopped (09/30/20 1105)   PRN Meds:.acetaminophen **OR** acetaminophen, fentaNYL (SUBLIMAZE) injection, hydrALAZINE, ipratropium-albuterol, midazolam, midazolam, ondansetron **OR** ondansetron (ZOFRAN) IV   CCM time: The patient is critically ill with multiple organ systems failure and requires high complexity decision making for assessment and support, frequent evaluation and titration of therapies, application of advanced monitoring technologies and extensive interpretation of multiple databases. Critical Care Time devoted to patient care services described in this note is 40 minutes.   Discussed at length with Dr. Leonel Ramsay.  Will initiate transfer to [facility], ICU as the patient will need continuous EEG monitoring.  Discussed with Dr. Micheline Maze PCCM at [facility]   Renold Don, MD [place] P[facility]   *This note was dictated using voice recognition software/Dragon.  Despite best efforts to proofread, errors can occur which can change the meaning.  Any change was purely unintentional.

## 2020-09-30 NOTE — Progress Notes (Addendum)
Subjective: No significant changes  Exam: Vitals:   09/30/20 1230 09/30/20 1245  BP: 128/76 127/72  Pulse: 79 78  Resp: 15 15  Temp:    SpO2: 100% 100%   Gen: In bed, NAD Resp: non-labored breathing, no acute distress Abd: soft, nt  Neuro: MS: opens eyes partially top noxious stimulation.  PJ:KDTOI, corneals intact, EOMI to doll's maneuver, cough presents Motor: she withdraws to noxious stimulation x 4, but also occasionally appears to extensor posture, but not to noxious stimulation.   Sensory: responds to nox stim x 4.   Some extensor posturing is seen at times, but when she is doing it, noxious stimulation prompts quick withdrawal.   Pertinent Labs: Cr 1.2  Impression: 65 year old female with a history of drug abuse as well as remote history of seizures who presents with altered mental status followed by seizure activity.  She continues to be quite encephalopathic, and though I had initially been concerned for PRES, this was not present on initial MRI, though I have seen cases where it did not show up initially. That being said, I would expect her to be improving if that were the case. She has not had signs of CNS infection such as fever, but without other cause this needs to be assessed.   Intentional overdose I had thought may be a consideration given the recent death of her long term partner, but with her confusion starting while caring for a child, I think this may be less likely. Also, I would expect more improvement.   Recommendations: 1) LP for cells, glucose, protein, culture, HSV, will collect extra in case further tests are desired.  2) Would restart some narcotic given chronic dependance.  3) I would favor continuous EEG monitoring and would consider transfer to accomplish this.  4) start acyclovir pending HSV PCR 5) continue keppra 750mg  BID  Roland Rack, MD [facility] [phone]  If 7pm- 7am, please page neurology on call as listed in  [place].

## 2020-09-30 NOTE — Discharge Summary (Signed)
Physician Discharge Summary  Patient ID: Natalie [name] MRN: [id number] DOB/AGE: 09/30/1955 65 y.o.  Admit date: 09/28/2020 Discharge date: 09/30/2020  Admission Diagnoses:  Discharge Diagnoses:  Active Problems:   Encephalopathy   Status epilepticus ([place])   Altered mental status   Acute respiratory failure with hypoxia (HCC)   Pressure injury of skin   Discharged Condition: critical but hemodynamically stable  Hospital Course:  65 year old female admitted to telemetry due to acute encephalopathy with unknown LKW time.  Per ED documentation & family interview, the patient's neighbor discovered her altered and aphasic covered in stool on 09/28/2020.  Per the patient's children, sister and husband she has been prescribed methadone daily for a " long time, possibly 20 years".  They state the patient has been taking this faithfully and was allowed to get take-home methadone packets from the clinic because of this.  They denied noticing any alcohol intake or recreational drug use.  Of note her significant other over the past 22 years died suddenly in his sleep in 08-15-2020.  Since then she has been noted to be falling at home intermittently, it is unknown if she had a loss of consciousness or not and the patient has not sought out medical attention after these falls. ED course: CT head negative for acute intracranial abnormality but does show chronic lacunar infarct.  UDS positive for benzodiazepines which were given in the emergency room as well as methadone which the patient takes chronically. Acetaminophen and salicylate levels negative & serum alcohol level negative.  Rapid response called upon patient's admission to telemetry.  Upon bedside arrival, patient was having full body convulsive tonic-clonic seizure like activity with secretions foam only a yes at the mouth that did not stop with 4 mg of Ativan, 5 mg of metoprolol & 2 mg of morphine.  Patient was tachycardic in the 190's, tachypneic  in the 50's gasping.  CODE BLUE was called for emergent intubation by ED physician due to continuing convulsive seizures. Patient did appear to become post ictal after more than 5 minutes, but was intubated for airway protection due to ongoing somnolence.  PCCM consulted for transfer to ICU.  She remains intubated and mechanically ventilated as of 09/30/2020.  Neurology consult on test performed LP, she is doing some posturing and potential subclinical seizure type activity.  Neurology consultant feels the patient needs to be transferred for continuous EEG monitoring and further management.  She has been empirically started on acyclovir for potential encephalitis.  In addition has been started on half of her normal dose of methadone to prevent withdrawal.  She has been on methadone for over 20 years.  She is otherwise hemodynamically stable however critically ill still.   Consults: neurology   Significant Diagnostic Studies: See chart.  Treatments: See narrative.  Discharge Exam: Blood pressure 127/72, pulse 78, temperature (!) 97 F (36.1 C), temperature source Axillary, resp. rate 15, height _0  (1.6 m), weight 54.4 kg, SpO2 100 %. General: Adult female, acute on chronically ill appearing, lying in bed intubated & sedated requiring mechanical ventilation  HEENT: MM pink/moist, anicteric, atraumatic, neck supple Neuro: Unresponsive, unable to follow commands, PERRL +4, MAE CV: s1s2 RRR, sinus tachycardia on monitor, no r/m/g Pulm: Regular, non labored on PRVC at 100% FiO2, breath sounds diminished throughout GI: soft, flat, bs x 4 Skin: Scattered abrasions and ecchymosis from PTA falls Extremities: warm/dry, pulses + 2 R/P, no edema noted  Allergies  Allergen Reactions  . Sulfasalazine Shortness Of Breath  .  Aspirin   . Darvon [Propoxyphene]   . Iodine   . Penicillins   . Sulfa Antibiotics   . Zithromax [Azithromycin]    Scheduled Meds: . budesonide (PULMICORT) nebulizer solution   0.25 mg Nebulization BID  . chlorhexidine gluconate (MEDLINE KIT)  15 mL Mouth Rinse BID  . Chlorhexidine Gluconate Cloth  6 each Topical Daily  . docusate  100 mg Per Tube BID  . insulin aspart  0-15 Units Subcutaneous Q4H  . mouth rinse  15 mL Mouth Rinse 10 times per day  . methadone  70 mg Per Tube Daily  . methylPREDNISolone (SOLU-MEDROL) injection  40 mg Intravenous Q12H  . polyethylene glycol  17 g Per Tube Daily  . sodium chloride flush  3 mL Intravenous Q12H   Continuous Infusions: . sodium chloride Stopped (09/30/20 1150)  . acyclovir    . ceFEPime (MAXIPIME) IV Stopped (09/30/20 1006)  . famotidine (PEPCID) IV Stopped (09/30/20 1109)  . levETIRAcetam Stopped (09/30/20 0947)  . norepinephrine (LEVOPHED) Adult infusion Stopped (09/29/20 1216)  . propofol (DIPRIVAN) infusion 50 mcg/kg/min (09/30/20 1351)  . thiamine injection Stopped (09/30/20 1105)   PRN Meds:.acetaminophen **OR** acetaminophen, fentaNYL (SUBLIMAZE) injection, hydrALAZINE, ipratropium-albuterol, midazolam, midazolam, ondansetron **OR** ondansetron (ZOFRAN) IV    Disposition:  Transfer to [name] for continuous EEG monitoring      Signed: Renold Don, MD [place] [facility]M 09/30/2020, 2:14 PM

## 2020-09-30 NOTE — Consult Note (Signed)
Pharmacy Antibiotic Note  Natalie [name] is a 65 y.o. 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 PMH includes drug abuse and seizures. Pt presented with AMS and seizure-like activity. Code was called and pt was intubated 3/25 for seizure. Pt is encephalopathic LP completed 3/27 Pharmacy has been consulted for cefepime dosing.  PCT 1.42>0.87  Plan: -- Start acyclovir 10 mg/kg (545 mg) q12h - adjusted for CrCl 25-50 mL/min -- Continue Cefepime 2 g IV q12h -- Monitor clinical picture and renal function -- F/U C&S, LP results, and abx deescalation / LOT  Height: 5\' 3"  (160 cm) Weight: 54.4 kg (120 lb) IBW/kg (Calculated) : 52.4  Temp (24hrs), Avg:97 F (36.1 C), Min:96.4 F (35.8 C), Max:97.7 F (36.5 C)  Recent Labs  Lab 09/28/20 1347 09/29/20 0430 09/30/20 0420  WBC 5.0 12.4* 9.4  CREATININE 1.12*  1.24* 1.20* 1.20*    Estimated Creatinine Clearance: 39.7 mL/min (A) (by C-G formula based on SCr of 1.2 mg/dL (H)).    Allergies  Allergen Reactions  . Sulfasalazine Shortness Of Breath  . Aspirin   . Darvon [Propoxyphene]   . Iodine   . Penicillins   . Sulfa Antibiotics   . Zithromax [Azithromycin]     Antimicrobials this admission: 3/25 cefepime >>  3/25 metronidazole >>  3/27 acyclovir >>  Microbiology results: 3/25 Resp panel: neg 3/26 trach aspirate: GPR 3/26 MRSA PCR neg 3/27 CSF fluid: pending  Thank you for allowing pharmacy to be a part of this patient's care.  Benn Moulder, PharmD Pharmacy Resident  09/30/2020 2:06 PM

## 2020-09-30 NOTE — Progress Notes (Signed)
Patient belongings left at [facility] 1 gold appearing set of earrings 1 silver appearing cross necklace 1 partial denture  Family notified by unit secretary Terri at [phone]; family states they will come to [facility] to pick up belongings.

## 2020-10-01 DIAGNOSIS — G9340 Encephalopathy, unspecified: Secondary | ICD-10-CM | POA: Diagnosis not present

## 2020-10-01 DIAGNOSIS — E440 Moderate protein-calorie malnutrition: Secondary | ICD-10-CM | POA: Insufficient documentation

## 2020-10-01 LAB — CBC
HCT: 42.2 % (ref 36.0–46.0)
Hemoglobin: 13.9 g/dL (ref 12.0–15.0)
MCH: 30.2 pg (ref 26.0–34.0)
MCHC: 32.9 g/dL (ref 30.0–36.0)
MCV: 91.5 fL (ref 80.0–100.0)
Platelets: 193 10*3/uL (ref 150–400)
RBC: 4.61 MIL/uL (ref 3.87–5.11)
RDW: 14.9 % (ref 11.5–15.5)
WBC: 11.7 10*3/uL — ABNORMAL HIGH (ref 4.0–10.5)
nRBC: 0 % (ref 0.0–0.2)

## 2020-10-01 LAB — CULTURE, RESPIRATORY W GRAM STAIN
Culture: NORMAL
Gram Stain: NONE SEEN

## 2020-10-01 LAB — PHOSPHORUS
Phosphorus: 2.1 mg/dL — ABNORMAL LOW (ref 2.5–4.6)
Phosphorus: 5.2 mg/dL — ABNORMAL HIGH (ref 2.5–4.6)
Phosphorus: 3.6 mg/dL (ref 2.5–4.6)

## 2020-10-01 LAB — MAGNESIUM
Magnesium: 2 mg/dL (ref 1.7–2.4)
Magnesium: 2 mg/dL (ref 1.7–2.4)
Magnesium: 1.9 mg/dL (ref 1.7–2.4)

## 2020-10-01 LAB — BASIC METABOLIC PANEL
Anion gap: 11 (ref 5–15)
BUN: 19 mg/dL (ref 8–23)
CO2: 26 mmol/L (ref 22–32)
Calcium: 9.6 mg/dL (ref 8.9–10.3)
Chloride: 99 mmol/L (ref 98–111)
Creatinine, Ser: 1.13 mg/dL — ABNORMAL HIGH (ref 0.44–1.00)
GFR, Estimated: 55 mL/min — ABNORMAL LOW (ref >60)
Glucose, Bld: 136 mg/dL — ABNORMAL HIGH (ref 70–99)
Potassium: 3.5 mmol/L (ref 3.5–5.1)
Sodium: 136 mmol/L (ref 135–145)

## 2020-10-01 LAB — GLUCOSE, CAPILLARY
Glucose-Capillary: 126 mg/dL — ABNORMAL HIGH (ref 70–99)
Glucose-Capillary: 140 mg/dL — ABNORMAL HIGH (ref 70–99)
Glucose-Capillary: 152 mg/dL — ABNORMAL HIGH (ref 70–99)
Glucose-Capillary: 140 mg/dL — ABNORMAL HIGH (ref 70–99)
Glucose-Capillary: 142 mg/dL — ABNORMAL HIGH (ref 70–99)
Glucose-Capillary: 125 mg/dL — ABNORMAL HIGH (ref 70–99)

## 2020-10-01 MED ORDER — ONDANSETRON 4 MG PO TBDP: 4.0000 mg | ORAL_TABLET | Freq: Four times a day (QID) | ORAL | Status: DC | PRN

## 2020-10-01 MED ORDER — GABAPENTIN 250 MG/5ML PO SOLN
300.0000 mg | Freq: Every day | ORAL | Status: DC
  Administered 2020-10-01 – 2020-10-04 (×4): 300 mg
  Filled 2020-10-01 (×5): qty 6

## 2020-10-01 MED ORDER — VITAL HIGH PROTEIN PO LIQD
1000.0000 mL | ORAL | Status: DC
  Administered 2020-10-01: 1000 mL

## 2020-10-01 MED ORDER — CLONIDINE HCL 0.1 MG PO TABS
0.1000 mg | ORAL_TABLET | Freq: Four times a day (QID) | ORAL | Status: DC
  Administered 2020-10-01: 0.1 mg
  Filled 2020-10-01 (×2): qty 1

## 2020-10-01 MED ORDER — VITAL HIGH PROTEIN PO LIQD
1000.0000 mL | ORAL | Status: AC
  Administered 2020-10-01: 1000 mL

## 2020-10-01 MED ORDER — CLONIDINE HCL 0.1 MG PO TABS: 0.1000 mg | ORAL_TABLET | Freq: Every day | ORAL | Status: DC

## 2020-10-01 MED ORDER — CLONIDINE HCL 0.1 MG PO TABS: 0.1000 mg | ORAL_TABLET | ORAL | Status: DC

## 2020-10-01 MED ORDER — IPRATROPIUM-ALBUTEROL 0.5-2.5 (3) MG/3ML IN SOLN
3.0000 mL | Freq: Four times a day (QID) | RESPIRATORY_TRACT | Status: DC
  Administered 2020-10-01 – 2020-10-05 (×15): 3 mL via RESPIRATORY_TRACT
  Filled 2020-10-01 (×16): qty 3

## 2020-10-01 MED ORDER — DICYCLOMINE HCL 20 MG PO TABS
20.0000 mg | ORAL_TABLET | Freq: Four times a day (QID) | ORAL | Status: DC | PRN
  Filled 2020-10-01: qty 1

## 2020-10-01 MED ORDER — METHOCARBAMOL 500 MG PO TABS: 500.0000 mg | ORAL_TABLET | Freq: Three times a day (TID) | ORAL | Status: DC | PRN

## 2020-10-01 MED ORDER — PANTOPRAZOLE SODIUM 40 MG PO PACK
40.0000 mg | PACK | Freq: Every day | ORAL | Status: DC
  Administered 2020-10-01 – 2020-10-04 (×4): 40 mg
  Filled 2020-10-01 (×3): qty 20

## 2020-10-01 MED ORDER — HYDROXYZINE HCL 25 MG PO TABS
25.0000 mg | ORAL_TABLET | Freq: Four times a day (QID) | ORAL | Status: DC | PRN
  Filled 2020-10-01: qty 1

## 2020-10-01 MED ORDER — LACTATED RINGERS IV BOLUS
500.0000 mL | Freq: Once | INTRAVENOUS | Status: AC
  Administered 2020-10-01: 500 mL via INTRAVENOUS

## 2020-10-01 MED ORDER — LACTATED RINGERS IV SOLN: INTRAVENOUS | Status: DC

## 2020-10-01 MED ORDER — LOPERAMIDE HCL 2 MG PO CAPS
2.0000 mg | ORAL_CAPSULE | ORAL | Status: DC | PRN
  Filled 2020-10-01: qty 2

## 2020-10-01 MED ORDER — POTASSIUM CHLORIDE 20 MEQ PO PACK
20.0000 meq | PACK | Freq: Once | ORAL | Status: AC
  Administered 2020-10-01: 20 meq
  Filled 2020-10-01: qty 1

## 2020-10-01 MED ORDER — PROSOURCE TF PO LIQD
45.0000 mL | Freq: Two times a day (BID) | ORAL | Status: AC
Start: 2020-10-01 — End: 2020-10-01
  Administered 2020-10-01 (×2): 45 mL
  Filled 2020-10-01 (×2): qty 45

## 2020-10-01 MED ORDER — MIDAZOLAM HCL 2 MG/2ML IJ SOLN
1.0000 mg | INTRAMUSCULAR | Status: DC | PRN
  Administered 2020-10-01 – 2020-10-02 (×4): 1 mg via INTRAVENOUS
  Filled 2020-10-01 (×4): qty 2

## 2020-10-01 MED ORDER — VITAL HIGH PROTEIN PO LIQD: 1000.0000 mL | ORAL | Status: DC

## 2020-10-01 MED ORDER — VITAL AF 1.2 CAL PO LIQD
1000.0000 mL | ORAL | Status: DC
  Administered 2020-10-02: 1000 mL
  Filled 2020-10-01 (×2): qty 1000

## 2020-10-01 MED ORDER — POTASSIUM PHOSPHATES 15 MMOLE/5ML IV SOLN
15.0000 mmol | Freq: Once | INTRAVENOUS | Status: AC
  Administered 2020-10-01: 15 mmol via INTRAVENOUS
  Filled 2020-10-01: qty 5

## 2020-10-01 MED ORDER — PHENYLEPHRINE HCL-NACL 10-0.9 MG/250ML-% IV SOLN
0.0000 ug/min | INTRAVENOUS | Status: DC
  Administered 2020-10-01: 80 ug/min via INTRAVENOUS
  Administered 2020-10-01: 20 ug/min via INTRAVENOUS
  Administered 2020-10-01: 100 ug/min via INTRAVENOUS
  Administered 2020-10-01: 80 ug/min via INTRAVENOUS
  Administered 2020-10-02: 50 ug/min via INTRAVENOUS
  Administered 2020-10-02: 60 ug/min via INTRAVENOUS
  Administered 2020-10-02: 50 ug/min via INTRAVENOUS
  Administered 2020-10-02: 20 ug/min via INTRAVENOUS
  Filled 2020-10-01 (×3): qty 250
  Filled 2020-10-01: qty 500
  Filled 2020-10-01 (×3): qty 250

## 2020-10-01 MED ORDER — METHADONE HCL 5 MG PO TABS
25.0000 mg | ORAL_TABLET | Freq: Three times a day (TID) | ORAL | Status: DC
  Administered 2020-10-01 – 2020-10-04 (×9): 25 mg
  Filled 2020-10-01 (×9): qty 5

## 2020-10-01 NOTE — Progress Notes (Signed)
Phos 2.1, K+ 3.5 Replaced per protocol

## 2020-10-01 NOTE — Progress Notes (Signed)
NAME:  Natalie [name], MRN:  [id number], DOB:  March 27, 1956, LOS: 1 ADMISSION DATE:  09/30/2020, CONSULTATION DATE: 09/30/2020 REFERRING MD: Dr. Patsey Berthold, CHIEF COMPLAINT: Altered mental status  History of Present Illness:  65 year old female with COPD and CKD stage IIIa who was referred from [facility] for evaluation of altered mental status.  Pertinent  Medical History   Past Medical History:  Diagnosis Date  . Cataracts, bilateral   . Chickenpox cataracts  . COPD (chronic obstructive pulmonary disease) ([place])   . Depression   . Drug addiction ([place])   . Headache   . Hepatitis C virus infection   . Hepatitis C without hepatic coma   . History of chicken pox   . History of colon polyps   . Seizures ([place])      Significant Hospital Events: Including procedures, antibiotic start and stop dates in addition to other pertinent events     3/25-3/27 Patient presented to [facility] emergency department  with altered mental status, after she was found confused and aphasic covered in stool.  extensive work-up at [facility]  Negative ( CT head & MRI head which showed chronic changes, LP: which showed 5 WBCs and 42% of lymphocyte).  she was started empirically on acyclovir. Patient was noted to have upper extremity posturing intermittently, there was suspicion of a status epilepticus, she was transferred to [facility] for continuous EEG monitoring . 3/27 transfer to [facility]. No Sz noted on LTM  Interim History / Subjective:    Objective   Blood pressure (Abnormal) 158/91, pulse (Abnormal) 102, temperature 99.68 F (37.6 C), resp. rate 18, weight 51.1 kg, SpO2 100 %.    Vent Mode: PRVC FiO2 (%):  [30 %-40 %] 30 % Set Rate:  [15 bmp-18 bmp] 18 bmp Vt Set:  [420 mL-450 mL] 420 mL PEEP:  [5 cmH20] 5 cmH20 Pressure Support:  [10 cmH20] 10 cmH20 Plateau Pressure:  [11 cmH20-14 cmH20] 13 cmH20   Intake/Output Summary (Last 24 hours) at 10/01/2020 1004 Last data filed at 10/01/2020 0900 Gross per 24  hour  Intake 766.22 ml  Output 1725 ml  Net -958.78 ml   Filed Weights   10/01/20 0447  Weight: 51.1 kg    Examination: General this is a chronically ill appearing wf currently minimally responsive on vent HENT NCAT pupils dilated orally intubated Pulm dec t/o RR 30s Card rrr abd soft  Ext warm and dry Neuro grimaces to pain. Decerebrate postures w/ uppers. W/d lowers gu cl yellow via tube  Labs/imaging that I havepersonally reviewed  (right click and "Reselect all SmartList Selections" daily)  See below   Resolved Hospital Problem list     Assessment & Plan:  Acute encephalopathy of unknown etiology: differential being a status epilepticus, viral encephalitis, drug induced, PRES versus hepatic encephalopathy as patient has history of hep C. Pres seems less likely I also worry about gabapentin w/d Plan Cont LTM per neuro Cont keppra Add back chronic gabapentin 300mg /d Takes 140mg  methadone daily for several years. Start at 25mg  tid and also add clonidine taper  Serial neuro checks  Supportive care Cont acyclovir until HSV back  Acute hypoxic respiratory failure RR 30s w/ stim last cxr clear MS still won't support extubation Plan Cont full vent support VAP bundle PAD protocol RASS goal 0 Am cxr  CKD stage IIIa Plan Trend   Fluid and electrolyte imbalance: hypophosphatemia  Plan Replace and recheck   Best Practice (right click and "Reselect all SmartList Selections" daily)  Diet:  Tube Feed  If oral type of diet: see above Pain/Anxiety/Delirium protocol (if indicated): Yes (RASS goal 0) VAP protocol (if indicated): Yes DVT prophylaxis: LMWH GI prophylaxis: PPI Glucose control:  SSI Yes Central venous access:  Yes, and it is still needed Arterial line:  N/A Foley:  Yes, and it is no longer needed Restraints ACTION; IS/IS NOT: is not Mobility:  bed rest  PT consulted: N/A Blood pressure target: does not have  Studies pending: still on LTM Culture  data pending: CSF culture  Awaiting HSV Last reviewed culture data:today Antibiotics:acyclovir   Antibiotic de-escalation:  declined  Stop date: does not have pending  Daily labs: ordered Code Status:  full code Prognosis: Serious Last date of multidisciplinary goals of care discussion [pending ] Keep in ICU   My cct 34 min  Erick Colace ACNP-BC [place] Pager # [phone] OR # [phone] if no answer

## 2020-10-01 NOTE — Progress Notes (Signed)
[facility] Progress Note Patient Name: Natalie [name] DOB: [birth year] MRN: [id number]   Date of Service  10/01/2020  HPI/Events of Note  Recieved request for PRN sedation as with episodes of tachypnea  Already on dilaudid 2 mg per tube q 4 to avoid withdrawal  eICU Interventions  Ordered Versed 1 mg q 4 prn but will need to held close to the morning for neuro assessment     Intervention Category Minor Interventions: Agitation / anxiety - evaluation and management  Judd Lien 10/01/2020, 1:42 AM

## 2020-10-01 NOTE — Progress Notes (Signed)
LTM maint complete - EEG-ECG leads relocated re-prep, Cz re-prep

## 2020-10-01 NOTE — Progress Notes (Signed)
Initial Nutrition Assessment  DOCUMENTATION CODES:   Non-severe (moderate) malnutrition in context of chronic illness  INTERVENTION:   Tube feeding via OG tube: Transition to Vital AF 1.2 at 55 ml/h (1320 ml per day) 3/29  Provides 1584 kcal, 99 gm protein, 1070 ml free water daily    NUTRITION DIAGNOSIS:   Moderate Malnutrition related to chronic illness (COPD) as evidenced by moderate fat depletion,severe fat depletion,moderate muscle depletion,severe muscle depletion.  GOAL:   Patient will meet greater than or equal to 90% of their needs  MONITOR:   TF tolerance,Labs  REASON FOR ASSESSMENT:   Consult Enteral/tube feeding initiation and management  ASSESSMENT:   Pt with PMH of COPD, CKD stage III, depression, drug addiction on methadone and gabapentin PTA, hepatitis C, and seizures admitted to [facility] 3/25 with AMS, aphasic and covered in stool. Pt with acute encephalopathy of unknown etiology differential being status epilepticus, viral encephalitis, drug induced, PRES vs hepatic encephalopathy.    Pt discussed during ICU rounds and with RN.  No family present at this time.   3/27 tx to [facility] for continuous EEG   Patient is currently intubated on ventilator support MV: 10.4 L/min Temp (24hrs), Avg:98.2 F (36.8 C), Min:94.82 F (34.9 C), Max:99.68 F (37.6 C)   Medications reviewed and include: SSI Kphos x 1  Labs reviewed: PO4: 2.1 CBG's: 120-152   OG tube: tip in gastric fundus   Current TF:  Vital High Protein @ 40 ml/hr 45 ml ProSource TF BID  Provides: 1040 kcal and 106 grams protein  NUTRITION - FOCUSED PHYSICAL EXAM:  Flowsheet Row Most Recent Value  Orbital Region Severe depletion  Upper Arm Region Moderate depletion  Thoracic and Lumbar Region Moderate depletion  Buccal Region Unable to assess  Temple Region Severe depletion  Clavicle Bone Region Moderate depletion  Clavicle and Acromion Bone Region Severe depletion  Scapular Bone Region  Unable to assess  Dorsal Hand Moderate depletion  Patellar Region Moderate depletion  Anterior Thigh Region Moderate depletion  Posterior Calf Region Moderate depletion  Edema (RD Assessment) None  Hair Reviewed  Eyes Unable to assess  Mouth Unable to assess  Skin Reviewed  [dry]  Nails Reviewed       Diet Order:   Diet Order            Diet NPO time specified  Diet effective now                 EDUCATION NEEDS:   No education needs have been identified at this time  Skin:  Skin Assessment: Reviewed RN Assessment  Last BM:  unknown  Height:   Ht Readings from Last 1 Encounters:  09/28/20 5\' 3"  (1.6 m)    Weight:   Wt Readings from Last 1 Encounters:  10/01/20 51.1 kg    Ideal Body Weight:  52.2 kg  BMI:  Body mass index is 19.96 kg/m.  Estimated Nutritional Needs:   Kcal:  1500-1750  Protein:  80-100 grams  Fluid:  >1.5 L/day  Lockie Pares., RD, LDN, CNSC See AMiON for contact information

## 2020-10-01 NOTE — Procedures (Signed)
EEG Procedure CPT/Type of Study: 33545; 24hr EEG with video Referring Provider: Chand Primary Neurological Diagnosis: encephalopathy  History: This is a 65 yr old patient, undergoing an EEG to evaluate for liver disease, acute encephalopathy. Clinical State: disoriented  Technical Description:  The EEG was performed using standard setting per the guidelines of American Clinical Neurophysiology Society (ACNS).  A minimum of 21 electrodes were placed on scalp according to the International 10-20 or/and 10-10 Systems. Supplemental electrodes were placed as needed. Single EKG electrode was also used to detect cardiac arrhythmia. Patient's behavior was continuously recorded on video simultaneously with EEG. A minimum of 16 channels were used for data display. Each epoch of study was reviewed manually daily and as needed using standard referential and bipolar montages. Computerized quantitative EEG analysis (such as compressed spectral array analysis, trending, automated spike & seizure detection) were used as indicated.   Day 1: from [place] 09/30/20 to 0800 10/01/20  EEG Description: Overall Amplitude:Normal Predominant Frequency: The background activity showed theta-delta activity with about 2-6 Hz, that was frequent. Superimposed Frequencies: sparse beta activity bilaterally The background was symmetric  Background Abnormalities: Diffuse slowing as above Rhythmic or periodic pattern: Generalized periodic discharges; frequent 1hz  sharp GPDs with triphasic morphology and a stimulus-induced component Epileptiform activity: no Electrographic seizures: no Events: no   Breach rhythm: no  Reactivity: Present  Stimulation procedures:  Hyperventilation: not done Photic stimulation: not done  Sleep Background: Stage II  EKG:no significant arrhythmia  Impression: This was an abnormal continuous video EEG due to some diffuse slowing with triphasic waves/GPDs. This was consistent with a  toxic/metabolic encephalopathy pattern. No clear epileptiform morphology or seizures were seen.

## 2020-10-02 DIAGNOSIS — G9340 Encephalopathy, unspecified: Secondary | ICD-10-CM | POA: Diagnosis not present

## 2020-10-02 LAB — T4, FREE: Free T4: 1.01 ng/dL (ref 0.61–1.12)

## 2020-10-02 LAB — AMMONIA: Ammonia: 14 umol/L (ref 9–35)

## 2020-10-02 LAB — PHOSPHORUS
Phosphorus: 3.1 mg/dL (ref 2.5–4.6)
Phosphorus: 2.5 mg/dL (ref 2.5–4.6)

## 2020-10-02 LAB — BASIC METABOLIC PANEL
Anion gap: 8 (ref 5–15)
BUN: 22 mg/dL (ref 8–23)
CO2: 27 mmol/L (ref 22–32)
Calcium: 8.8 mg/dL — ABNORMAL LOW (ref 8.9–10.3)
Chloride: 104 mmol/L (ref 98–111)
Creatinine, Ser: 0.89 mg/dL (ref 0.44–1.00)
GFR, Estimated: >60 mL/min (ref >60)
Glucose, Bld: 137 mg/dL — ABNORMAL HIGH (ref 70–99)
Potassium: 3.1 mmol/L — ABNORMAL LOW (ref 3.5–5.1)
Sodium: 139 mmol/L (ref 135–145)

## 2020-10-02 LAB — GLUCOSE, CAPILLARY
Glucose-Capillary: 95 mg/dL (ref 70–99)
Glucose-Capillary: 120 mg/dL — ABNORMAL HIGH (ref 70–99)
Glucose-Capillary: 151 mg/dL — ABNORMAL HIGH (ref 70–99)
Glucose-Capillary: 120 mg/dL — ABNORMAL HIGH (ref 70–99)
Glucose-Capillary: 135 mg/dL — ABNORMAL HIGH (ref 70–99)
Glucose-Capillary: 131 mg/dL — ABNORMAL HIGH (ref 70–99)

## 2020-10-02 LAB — VITAMIN B12: Vitamin B-12: 85 pg/mL — ABNORMAL LOW (ref 180–914)

## 2020-10-02 LAB — MAGNESIUM
Magnesium: 1.9 mg/dL (ref 1.7–2.4)
Magnesium: 1.9 mg/dL (ref 1.7–2.4)

## 2020-10-02 LAB — FOLATE: Folate: 4.7 ng/mL — ABNORMAL LOW (ref >5.9)

## 2020-10-02 LAB — TSH: TSH: 7.203 u[IU]/mL — ABNORMAL HIGH (ref 0.350–4.500)

## 2020-10-02 MED ORDER — THIAMINE HCL 100 MG PO TABS: 100.0000 mg | ORAL_TABLET | Freq: Every day | ORAL | Status: DC

## 2020-10-02 MED ORDER — THIAMINE HCL 100 MG PO TABS
100.0000 mg | ORAL_TABLET | Freq: Every day | ORAL | Status: DC
  Administered 2020-10-04 – 2020-10-09 (×5): 100 mg
  Filled 2020-10-02 (×6): qty 1

## 2020-10-02 MED ORDER — CYANOCOBALAMIN 1000 MCG/ML IJ SOLN
500.0000 ug | Freq: Every day | INTRAMUSCULAR | Status: AC
  Administered 2020-10-02 – 2020-10-06 (×5): 500 ug via SUBCUTANEOUS
  Filled 2020-10-02: qty 1
  Filled 2020-10-02 (×5): qty 0.5

## 2020-10-02 MED ORDER — THIAMINE HCL 100 MG/ML IJ SOLN
100.0000 mg | Freq: Every day | INTRAMUSCULAR | Status: DC
  Administered 2020-10-02: 100 mg via INTRAVENOUS
  Filled 2020-10-02: qty 2

## 2020-10-02 MED ORDER — DEXMEDETOMIDINE HCL IN NACL 400 MCG/100ML IV SOLN
0.2000 ug/kg/h | INTRAVENOUS | Status: DC
  Administered 2020-10-02: 0.2 ug/kg/h via INTRAVENOUS
  Administered 2020-10-02: 0.4 ug/kg/h via INTRAVENOUS
  Administered 2020-10-03: 0.6 ug/kg/h via INTRAVENOUS
  Administered 2020-10-04: 0.3 ug/kg/h via INTRAVENOUS
  Filled 2020-10-02 (×4): qty 100

## 2020-10-02 MED ORDER — POTASSIUM CHLORIDE 20 MEQ PO PACK
40.0000 meq | PACK | Freq: Two times a day (BID) | ORAL | Status: AC
  Administered 2020-10-02 (×2): 40 meq
  Filled 2020-10-02 (×2): qty 2

## 2020-10-02 MED ORDER — THIAMINE HCL 100 MG/ML IJ SOLN
100.0000 mg | Freq: Every day | INTRAMUSCULAR | Status: DC
  Administered 2020-10-03 – 2020-10-05 (×2): 100 mg via INTRAVENOUS
  Filled 2020-10-02 (×3): qty 2

## 2020-10-02 NOTE — Progress Notes (Signed)
Neurology Progress Note  S: No overnight events. No witness seizure activity, including tremoring or jerking per RN. Reviewed LTM overnight results. Per RN, she wiggles her feet to pain and does not truly withdraw or localize. Can not perform subjective due to patient's AMS. Neurology consulted on 09/30/20 due to witness generalized tonic/clonic seizure activity on day of admission.   O: Current vital signs: BP 118/71   Pulse 90   Temp 99.6 F (37.6 C) (Axillary)   Resp (!) 9   Wt 51.1 kg   SpO2 97%   BMI 19.96 kg/m  Vital signs in last 24 hours: Temp:  [94.82 F (34.9 C)-99.7 F (37.6 C)] 99.6 F (37.6 C) (03/29 0800) Pulse Rate:  [73-107] 90 (03/29 0900) Resp:  [9-22] 9 (03/29 0900) BP: (65-165)/(47-102) 118/71 (03/29 0900) SpO2:  [96 %-100 %] 97 % (03/29 0900) FiO2 (%):  [30 %] 30 % (03/29 0744)  GENERAL: Awake. Opens eyes to loud clapping.  HEENT: Normocephalic and atraumatic LUNGS: ETT in place and on ventilator. CV: RRR on tele.  Ext: warm  Exam on Precedex 0.2   No Versed, Fentanyl, or Propofol  GCS:   Best motor   4         Best eye   3        Best verbal    1          Total:  8  NEURO:  Cranial Nerves:  II: PERRL. If eyes forced open, blinks to threat.  III, IV, VI: Eyes are midline with bilateral upward gaze. No nystagmus with forced turning of head. Dolls eyes with eyes stationary when moving head. V, VII: Furls brow to tactile stimulation. + corneal reflexes.  VIII: hearing intact to voice.  IX, X: + cough reflex.  XI: head is grossly midline. XII: intubated Motor: Movement BLEs spontaneously, but not BUE.  Tone: is rigid to RUE, but can be overcome with repeated arm movement. Other extremities with normal tone.  Bulk is normal Sensation-withdraws to pain with any touch of face and forced eye opening. Withdraws to pain BLE. Localizes to pain LUE. Moves RUE slightly to pain.  Cerebellum: No tremors, no clonus noted.  Gait- bedrest  Medications  Current  Facility-Administered Medications:  .  acyclovir (ZOVIRAX) 545 mg in dextrose 5 % 100 mL IVPB, 10 mg/kg, Intravenous, Q8H, Jacky Kindle, MD, Stopped at 10/02/20 [phone] .  amLODipine (NORVASC) tablet 10 mg, 10 mg, Per Tube, Daily, Jacky Kindle, MD, 10 mg at 10/01/20 0949 .  chlorhexidine gluconate (MEDLINE KIT) (PERIDEX) 0.12 % solution 15 mL, 15 mL, Mouth Rinse, BID, Chand, Sudham, MD, 15 mL at 10/02/20 0741 .  Chlorhexidine Gluconate Cloth 2 % PADS 6 each, 6 each, Topical, Q0600, Jacky Kindle, MD, 6 each at 10/02/20 0142 .  dexmedetomidine (PRECEDEX) 400 MCG/100ML (4 mcg/mL) infusion, 0.2-0.6 mcg/kg/hr, Intravenous, Titrated, Anders Simmonds, MD, Last Rate: 2.56 mL/hr at 10/02/20 0900, 0.2 mcg/kg/hr at 10/02/20 0900 .  dicyclomine (BENTYL) tablet 20 mg, 20 mg, Oral, Q6H PRN, Erick Colace, NP .  docusate sodium (COLACE) capsule 100 mg, 100 mg, Oral, BID PRN, Jacky Kindle, MD .  enoxaparin (LOVENOX) injection 40 mg, 40 mg, Subcutaneous, Q24H, Chand, Sudham, MD, 40 mg at 10/01/20 1833 .  feeding supplement (VITAL AF 1.2 CAL) liquid 1,000 mL, 1,000 mL, Per Tube, Continuous, Mannam, Praveen, MD .  gabapentin (NEURONTIN) 250 MG/5ML solution 300 mg, 300 mg, Per Tube, Daily, Erick Colace, NP, 300 mg at 10/01/20 1205 .  hydrOXYzine (ATARAX/VISTARIL) tablet 25 mg, 25 mg, Oral, Q6H PRN, Salvadore Dom E, NP .  insulin aspart (novoLOG) injection 0-9 Units, 0-9 Units, Subcutaneous, Q4H, Jacky Kindle, MD, 1 Units at 10/01/20 2356 .  ipratropium-albuterol (DUONEB) 0.5-2.5 (3) MG/3ML nebulizer solution 3 mL, 3 mL, Nebulization, Q6H, Salvadore Dom E, NP, 3 mL at 10/02/20 0744 .  labetalol (NORMODYNE) injection 10 mg, 10 mg, Intravenous, Q6H, Chand, Sudham, MD, 10 mg at 10/01/20 1217 .  lactated ringers infusion, , Intravenous, Continuous, Erick Colace, NP, Last Rate: 75 mL/hr at 10/02/20 0900, Infusion Verify at 10/02/20 0900 .  levETIRAcetam (KEPPRA) 750 mg in sodium chloride 0.9 % 100 mL IVPB, 750  mg, Intravenous, Q12H, Jacky Kindle, MD, Stopped at 10/02/20 0709 .  loperamide (IMODIUM) capsule 2-4 mg, 2-4 mg, Oral, PRN, Erick Colace, NP .  MEDLINE mouth rinse, 15 mL, Mouth Rinse, 10 times per day, Jacky Kindle, MD, 15 mL at 10/02/20 0334 .  methadone (DOLOPHINE) tablet 25 mg, 25 mg, Per Tube, Q8H, Erick Colace, NP, 25 mg at 10/02/20 0512 .  methocarbamol (ROBAXIN) tablet 500 mg, 500 mg, Oral, Q8H PRN, Erick Colace, NP .  midazolam (VERSED) injection 1 mg, 1 mg, Intravenous, Q4H PRN, Judd Lien, MD, 1 mg at 10/02/20 0331 .  ondansetron (ZOFRAN-ODT) disintegrating tablet 4 mg, 4 mg, Oral, Q6H PRN, Erick Colace, NP .  pantoprazole sodium (PROTONIX) 40 mg/20 mL oral suspension 40 mg, 40 mg, Per Tube, Q1200, Erick Colace, NP, 40 mg at 10/01/20 1149 .  phenylephrine (NEOSYNEPHRINE) 10-0.9 MG/250ML-% infusion, 0-400 mcg/min, Intravenous, Titrated, Erick Colace, NP, Last Rate: 22.5 mL/hr at 10/02/20 0900, 15 mcg/min at 10/02/20 0900 .  polyethylene glycol (MIRALAX / GLYCOLAX) packet 17 g, 17 g, Oral, Daily PRN, Jacky Kindle, MD .  Derrill Memo ON 10/03/2020] thiamine (B-1) injection 100 mg, 100 mg, Intravenous, Daily **OR** [START ON 10/03/2020] thiamine tablet 100 mg, 100 mg, Per Tube, Daily, Mannam, Praveen, MD  Pertinent Labs Vit B12 85      TSH 7.203     K 3.1     ammonia 14    Glucose 95-120 CSF culture NTD     PCR HSV pending.   Imaging/Testing  CTH 3/27, no change since 09/28/20. No acute abnormality.   MRI brain No acute infarction, hemorrhage, or mass. Nonspecific white matter disease that may reflect age advanced chronic microvascular ischemic changes. 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  10/02/20 EEG This was an abnormal continuous video EEG due to some diffuse slowing with triphasic waves/GPDs, consistent with a toxic/metabolic encephalopathy pattern. The GPD/SIRPIDs improved temporarily midday with decreased stimulation but  returned overnight with increased patient care. These showed no clear ictal evolution.   Assessment: 65 yo female with a history of substance abuse, on Methadone x 20 years. She presented on 09/30/20 after transfer from [facility] for LTM after being found down with witnessed tonic/clonic seizure on arrival. She was emergently intubated secondary to inability to protect airway and started on Keppra. LP was negative for acute signs of infection. CSF culture NTD. She was empirically started on Acyclovir for meningitis until HSV PCR results. EEG x 1 with evidence of triphasic morphology without noted seizure activity but consistent with encephalopathy. Continued EEG overnight with basically same findings consistent with toxic metabolic encephalopathy. No seizure activity, tremors or jerking per RN. MRI brain with evidence of white matter disease which may be contributing to encephalopathy but no other significant abnormality. Labs  show normal ammonia, elevated TSH with free T3/4 pending. B12 level low. PCCM following and per note, concern for hepatic encephalopathy due to history of Hepatitis C, but ammonia normal.   Plan: 1. AMS. Likely toxic metabolic encephalopathy. Seems to be improved since initial exam. Almost off sedation.  2. Substance abuse, found down, ? OD. Methadone per PCCM. 3. Low Vitamin B12- start repletion. 4. Agree with Thiamine.  5. R/o meningitis-CSF bland. Awaiting HSV PCR. Continue Acyclovir for now. 6. Witness seizure activity remote history of seizure activity-d/c'd LTM EEG. Continue Keppra $RemoveBeforeD'750mg'[id number]$  IV q12 hours for now.  [place] Pager Number [phone]

## 2020-10-02 NOTE — Procedures (Signed)
EEG Procedure CPT/Type of Study: 70623; 24hr EEG with video Referring Provider: Chand Primary Neurological Diagnosis: encephalopathy  History: This is a 65 yr old patient, undergoing an EEG to evaluate for liver disease, acute encephalopathy. Clinical State: disoriented  Technical Description:  The EEG was performed using standard setting per the guidelines of American Clinical Neurophysiology Society (ACNS).  A minimum of 21 electrodes were placed on scalp according to the International 10-20 or/and 10-10 Systems. Supplemental electrodes were placed as needed. Single EKG electrode was also used to detect cardiac arrhythmia. Patient's behavior was continuously recorded on video simultaneously with EEG. A minimum of 16 channels were used for data display. Each epoch of study was reviewed manually daily and as needed using standard referential and bipolar montages. Computerized quantitative EEG analysis (such as compressed spectral array analysis, trending, automated spike & seizure detection) were used as indicated.   Day 2: from 0800 10/01/20 to 0730 10/02/20  EEG Description: Overall Amplitude:Normal Predominant Frequency: The background activity showed theta-delta activity with about 2-6 Hz, that was frequent. Superimposed Frequencies: sparse beta activity bilaterally The background was symmetric  Background Abnormalities: Diffuse slowing as above Rhythmic or periodic pattern: Generalized periodic discharges; prolonged runs of 1-2hz  sharp GPDs with triphasic morphology and a stimulus-induced component. There were longer periods without these discharges midday when the patient was resting but increased overnight with stimulation by nursing staff. Epileptiform activity: no Electrographic seizures: no Events: no   Breach rhythm: no  Reactivity: Present  Stimulation procedures:  Hyperventilation: not done Photic stimulation: not done  Sleep Background: Stage II  EKG:no significant  arrhythmia  Impression: This was an abnormal continuous video EEG due to some diffuse slowing with triphasic waves/GPDs, consistent with a toxic/metabolic encephalopathy pattern. The GPD/SIRPIDs improved temporarily midday with decreased stimulation but returned overnight with increased patient care. These showed no clear ictal evolution.

## 2020-10-02 NOTE — Progress Notes (Addendum)
NAME:  Natalie [name], MRN:  [id number], DOB:  1955-10-12, LOS: 2 ADMISSION DATE:  09/30/2020, CONSULTATION DATE: 09/30/2020 REFERRING MD: Dr. Patsey Berthold, CHIEF COMPLAINT: Altered mental status  History of Present Illness:  65 year old female with COPD and CKD stage IIIa who was referred from [facility] for evaluation of altered mental status.  Pertinent  Medical History   Past Medical History:  Diagnosis Date  . Cataracts, bilateral   . Chickenpox cataracts  . COPD (chronic obstructive pulmonary disease) ([place])   . Depression   . Drug addiction ([place])   . Headache   . Hepatitis C virus infection   . Hepatitis C without hepatic coma   . History of chicken pox   . History of colon polyps   . Seizures ([place])      Significant Hospital Events: Including procedures, antibiotic start and stop dates in addition to other pertinent events     3/25-3/27 Patient presented to [facility] emergency department  with altered mental status, after she was found confused and aphasic covered in stool.  extensive work-up at [facility]  Negative ( CT head & MRI head which showed chronic changes, LP: which showed 5 WBCs and 42% of lymphocyte).  she was started empirically on acyclovir. Patient was noted to have upper extremity posturing intermittently, there was suspicion of a status epilepticus, she was transferred to [facility] for continuous EEG monitoring . 3/27 transfer to [facility]. No Sz noted on LTM. Marland Kitchen 3/28 Started on methadone and clonidine but got hypotensive w/ clonidine req Neo so stopped. Added back Neurontin. Started tube feeds. dc'd foley. Started TFs.  . 3/29: agitated so precedex infusion started. Weaning pressors off. More awake localizing. Has right UE weakness really only wd to pain. Over all though seems more awake. Able to tol PSV but MS not supporting extubation   Interim History / Subjective:  Excellent VT on PSV. More awake and a little restless.   Objective   Blood pressure 118/71, pulse  90, temperature 99.6 F (37.6 C), temperature source Axillary, resp. rate (Abnormal) 9, weight 51.1 kg, SpO2 97 %.    Vent Mode: PRVC FiO2 (%):  [30 %] 30 % Set Rate:  [18 bmp] 18 bmp Vt Set:  [420 mL] 420 mL PEEP:  [5 cmH20] 5 cmH20 Plateau Pressure:  [11 cmH20-12 cmH20] 11 cmH20   Intake/Output Summary (Last 24 hours) at 10/02/2020 2330 Last data filed at 10/02/2020 0900 Gross per 24 hour  Intake 4568.43 ml  Output 1257 ml  Net 3311.43 ml   Filed Weights   10/01/20 0447  Weight: 51.1 kg    Examination: General this is a 65 year old white female she is now a little more restless. Still not able to follow commands but more awake generally than 3/28 HENT NCAT no JVD MMM Orally intubated. Left IJ unremarkable Pulm clear Vts 600s on PSV 8 Card RRR abd soft tol TFs gu cl yellow via purwick  Neuro more agitated. Will localize on left. Little flicker w/ pain to right UE only  Labs/imaging that I havepersonally reviewed  (right click and "Reselect all SmartList Selections" daily)  See below   Resolved Hospital Problem list     Assessment & Plan:   Acute encephalopathy of unknown etiology: differential being a status epilepticus, viral encephalitis, drug induced, PRES versus hepatic encephalopathy as patient has history of hep C. Pres seems less likely I also worry about gabapentin w/d Plan LTM as directed by neuro Cont Keppra Cont gabapentin  300qd Cont methadone 25 tid (takes 140mg /d at home) this will take a little time to take effect Cont serial neuro checks Cont acyclovir until HSV back    Acute hypoxic respiratory failure Plan Cont full vent support Am cxr PSV as tolerated VAP bundle Daily assessment for extubation  Drug induced Hypotension Plan Cont neo for SBP >100 Dec LR to 50cc/hr Dc antihypertensives (still had labetolol on profile)  CKD stage IIIa Plan Trend  TSH elevated Plan Free t3 and t4  Fluid and electrolyte imbalance: hypokalemia,    Plan Replace and recheck   Best Practice (right click and "Reselect all SmartList Selections" daily)   Diet:  Tube Feed  If oral type of diet: see above Pain/Anxiety/Delirium protocol (if indicated): Yes (RASS goal -1) VAP protocol (if indicated): Yes DVT prophylaxis: LMWH GI prophylaxis: PPI Glucose control:  SSI Yes Central venous access:  Yes, and it is still needed Arterial line:  N/A Foley:  N/A Restraints ACTION; IS/IS NOT: is not Mobility:  bed rest  PT consulted: N/A Blood pressure target: does not have  Studies pending: still on LTM  Culture data pending: CSF culture  Awaiting HSV Last reviewed culture data:today Antibiotics:acyclovir   Antibiotic de-escalation:  undecided  Stop date: does not have pending  Daily labs: ordered Code Status:  full code Prognosis: Serious Last date of multidisciplinary goals of care discussion [pending ] Keep in ICU   My cct 34 min  Erick Colace ACNP-BC [place] Pager # [phone] OR # [phone] if no answer

## 2020-10-02 NOTE — Progress Notes (Signed)
vLTM EEG complete. No skin breakdown 

## 2020-10-02 NOTE — Progress Notes (Signed)
eLink Physician-Brief Progress Note Patient Name: Natalie [name] DOB: 1955-12-28 MRN: [id number]   Date of Service  10/02/2020  HPI/Events of Note  Agitation - Ordered Versed IV only lasts 45 minutes.  eICU Interventions  Plan: 1. Low dose Precedex IV infusion (0.2 -0.6 mcg/kg/hour). Titrate to RASS = 0.     Intervention Category Major Interventions: Delirium, psychosis, severe agitation - evaluation and management  Dani Wallner [place] 10/02/2020, 2:10 AM

## 2020-10-03 ENCOUNTER — Inpatient Hospital Stay (HOSPITAL_COMMUNITY): Payer: BC Managed Care – PPO

## 2020-10-03 DIAGNOSIS — G9340 Encephalopathy, unspecified: Secondary | ICD-10-CM | POA: Diagnosis not present

## 2020-10-03 LAB — COMPREHENSIVE METABOLIC PANEL
ALT: 26 U/L (ref 0–44)
AST: 36 U/L (ref 15–41)
Albumin: 2.8 g/dL — ABNORMAL LOW (ref 3.5–5.0)
Alkaline Phosphatase: 63 U/L (ref 38–126)
Anion gap: 5 (ref 5–15)
BUN: 19 mg/dL (ref 8–23)
CO2: 27 mmol/L (ref 22–32)
Calcium: 9.3 mg/dL (ref 8.9–10.3)
Chloride: 105 mmol/L (ref 98–111)
Creatinine, Ser: 0.84 mg/dL (ref 0.44–1.00)
GFR, Estimated: >60 mL/min (ref >60)
Glucose, Bld: 130 mg/dL — ABNORMAL HIGH (ref 70–99)
Potassium: 3.8 mmol/L (ref 3.5–5.1)
Sodium: 137 mmol/L (ref 135–145)
Total Bilirubin: 0.6 mg/dL (ref 0.3–1.2)
Total Protein: 5.4 g/dL — ABNORMAL LOW (ref 6.5–8.1)

## 2020-10-03 LAB — CBC
HCT: 34.1 % — ABNORMAL LOW (ref 36.0–46.0)
Hemoglobin: 11.1 g/dL — ABNORMAL LOW (ref 12.0–15.0)
MCH: 30.8 pg (ref 26.0–34.0)
MCHC: 32.6 g/dL (ref 30.0–36.0)
MCV: 94.7 fL (ref 80.0–100.0)
Platelets: 145 10*3/uL — ABNORMAL LOW (ref 150–400)
RBC: 3.6 MIL/uL — ABNORMAL LOW (ref 3.87–5.11)
RDW: 15.1 % (ref 11.5–15.5)
WBC: 5.7 10*3/uL (ref 4.0–10.5)
nRBC: 0 % (ref 0.0–0.2)

## 2020-10-03 LAB — GLUCOSE, CAPILLARY
Glucose-Capillary: 160 mg/dL — ABNORMAL HIGH (ref 70–99)
Glucose-Capillary: 115 mg/dL — ABNORMAL HIGH (ref 70–99)
Glucose-Capillary: 119 mg/dL — ABNORMAL HIGH (ref 70–99)
Glucose-Capillary: 121 mg/dL — ABNORMAL HIGH (ref 70–99)
Glucose-Capillary: 121 mg/dL — ABNORMAL HIGH (ref 70–99)
Glucose-Capillary: 160 mg/dL — ABNORMAL HIGH (ref 70–99)

## 2020-10-03 LAB — T3, FREE: T3, Free: 3 pg/mL (ref 2.0–4.4)

## 2020-10-03 LAB — CSF CULTURE W GRAM STAIN: Culture: NO GROWTH

## 2020-10-03 LAB — HSV 1/2 PCR, CSF
HSV-1 DNA: NEGATIVE
HSV-2 DNA: NEGATIVE

## 2020-10-03 MED ORDER — PROSOURCE TF PO LIQD
45.0000 mL | Freq: Every day | ORAL | Status: DC
  Administered 2020-10-03 – 2020-10-04 (×2): 45 mL
  Filled 2020-10-03 (×2): qty 45

## 2020-10-03 MED ORDER — OSMOLITE 1.2 CAL PO LIQD
1000.0000 mL | ORAL | Status: DC
  Administered 2020-10-03: 1000 mL

## 2020-10-03 NOTE — Progress Notes (Signed)
Initial Nutrition Assessment  DOCUMENTATION CODES:   Non-severe (moderate) malnutrition in context of chronic illness  INTERVENTION:   Tube feeding via Cortrak tube: Osmolite 1.2 at 55 ml/h (1320 ml per day)  45 ml ProSource TF daily  Provides 1624 kcal, 84 gm protein, 1070 ml free water daily    NUTRITION DIAGNOSIS:   Moderate Malnutrition related to chronic illness (COPD) as evidenced by moderate fat depletion,severe fat depletion,moderate muscle depletion,severe muscle depletion. Ongoing.   GOAL:   Patient will meet greater than or equal to 90% of their needs Met with TF.   MONITOR:   TF tolerance,Labs  REASON FOR ASSESSMENT:   Consult Enteral/tube feeding initiation and management  ASSESSMENT:   Pt with PMH of COPD, CKD stage III, depression, drug addiction on methadone and gabapentin PTA, hepatitis C, and seizures admitted to [facility] 3/25 with AMS, aphasic and covered in stool. Pt with acute encephalopathy of unknown etiology differential being status epilepticus, viral encephalitis, drug induced, PRES vs hepatic encephalopathy.    Pt discussed during ICU rounds and with RN.  Pt now off pressors. Failed swallow eval post extubation.   3/27 tx to [facility] for continuous EEG 3/30  Extubated; cortrak placed tip gastric. Pt awake and agitated trying to get out of bed  Medications reviewed and include: vitamin B 12, SSI, protonix, thiamine  precedex  Labs reviewed: CBG's: 115-160    Current TF:  Vital AF 1.2 at 55 ml/h  Provides 1584 kcal, 99 gm protein  Diet Order:   Diet Order            Diet NPO time specified  Diet effective now                 EDUCATION NEEDS:   No education needs have been identified at this time  Skin:  Skin Assessment: Reviewed RN Assessment  Last BM:  unknown  Height:   Ht Readings from Last 1 Encounters:  09/28/20 $RemoveB'5\' 3"'UqVpBHvL$  (1.6 m)    Weight:   Wt Readings from Last 1 Encounters:  10/01/20 51.1 kg    Ideal Body Weight:   52.2 kg  BMI:  Body mass index is 19.96 kg/m.  Estimated Nutritional Needs:   Kcal:  1500-1750  Protein:  80-100 grams  Fluid:  >1.5 L/day  Lockie Pares., RD, LDN, CNSC See AMiON for contact information

## 2020-10-03 NOTE — Procedures (Signed)
Cortrak  Person Inserting Tube:  Jacob Chamblee, Creola Corn, RD Tube Type:  Cortrak - 43 inches Tube Location:  Right nare Initial Placement:  Stomach Secured by: Bridle Technique Used to Measure Tube Placement:  Documented cm marking at nare/ corner of mouth Cortrak Secured At:  62 cm    Cortrak Tube Team Note:  Consult received to place a Cortrak feeding tube.   No x-ray is required. RN may begin using tube.    If the tube becomes dislodged please keep the tube and contact the Cortrak team at [email or web address] (password [email or web address]) for replacement.  If after hours and replacement cannot be delayed, place a NG tube and confirm placement with an abdominal x-ray.    Larkin Ina, MS, RD, LDN RD pager number and weekend/on-call pager number located in [place].

## 2020-10-03 NOTE — Evaluation (Signed)
Clinical/Bedside Swallow Evaluation Patient Details  Name: Natalie [name] MRN: [id number] Date of Birth: 1956/05/04  Today's Date: 10/03/2020 Time: SLP Start Time (ACUTE ONLY): 85 SLP Stop Time (ACUTE ONLY): 1253 SLP Time Calculation (min) (ACUTE ONLY): 13 min  Past Medical History:  Past Medical History:  Diagnosis Date  . Cataracts, bilateral   . Chickenpox cataracts  . COPD (chronic obstructive pulmonary disease) ([place])   . Depression   . Drug addiction ([place])   . Headache   . Hepatitis C virus infection   . Hepatitis C without hepatic coma   . History of chicken pox   . History of colon polyps   . Seizures ([place])    Past Surgical History:  Past Surgical History:  Procedure Laterality Date  . APPENDECTOMY    . CHOLECYSTECTOMY    . COLONOSCOPY WITH PROPOFOL N/A 04/30/2015   Procedure: COLONOSCOPY WITH PROPOFOL;  Surgeon: Josefine Class, MD;  Location: [facility] ENDOSCOPY;  Service: Endoscopy;  Laterality: N/A;  . COLONOSCOPY WITH PROPOFOL N/A 07/13/2017   Procedure: COLONOSCOPY WITH PROPOFOL;  Surgeon: Manya Silvas, MD;  Location: [facility] ENDOSCOPY;  Service: Endoscopy;  Laterality: N/A;  . ESOPHAGOGASTRODUODENOSCOPY (EGD) WITH PROPOFOL N/A 07/13/2017   Procedure: ESOPHAGOGASTRODUODENOSCOPY (EGD) WITH PROPOFOL;  Surgeon: Manya Silvas, MD;  Location: [facility] ENDOSCOPY;  Service: Endoscopy;  Laterality: N/A;  . EYE SURGERY     HPI:  65 year old female with COPD and CKD stage IIIa who was referred from [facility] for evaluation of altered mental status, CT and MRi negative for acute change, LP suggesting of infection, started empirically on acyclovir. Then concern for seizure. Pt intubated from 3/27-3/30.   Assessment / Plan / Recommendation Clinical Impression  Pt presents with significant lethargy, within the same minutes, restless and awake and then fully asleep. Pt also aphonic and generally weak. With constant verbal cueing SLP able to administer ice chips though pt needed cues to  manipulate and swallow the melted ice. Puree managed similarly. Pt could not be successfully cued to sip water. With total assist feeding pt orally held water and swallowed with questionable throat clearing. Overall, pt is too lethargic for consistent oral intake. She also has risk of aspiration given dysphonia and AMS following extubation. Advised RN to consider Cortrak despite concerns she may not tolerate it. If Cortak is lost, RN can attempt pills crushed in puree, especially if she is more alert. Will f/u for trials. SLP Visit Diagnosis: Dysphagia, oropharyngeal phase (R13.12)    Aspiration Risk  Moderate aspiration risk    Diet Recommendation NPO except meds   Medication Administration: Crushed with puree    Other  Recommendations     Follow up Recommendations Skilled Nursing facility      Frequency and Duration min 2x/week  2 weeks       Prognosis Prognosis for Safe Diet Advancement: Good Barriers to Reach Goals: Cognitive deficits      Swallow Study   General HPI: 65 year old female with COPD and CKD stage IIIa who was referred from [facility] for evaluation of altered mental status, CT and MRi negative for acute change, LP suggesting of infection, started empirically on acyclovir. Then concern for seizure. Pt intubated from 3/27-3/30. Type of Study: Bedside Swallow Evaluation Diet Prior to this Study: NPO Temperature Spikes Noted: No Respiratory Status: Room air History of Recent Intubation: Yes Length of Intubations (days): 4 days Date extubated: 10/03/20 Behavior/Cognition: Lethargic/Drowsy;Requires cueing;Doesn't follow directions Oral Cavity Assessment: Within Functional Limits Oral Care Completed by SLP: No Oral  Cavity - Dentition: Edentulous Vision: Impaired for self-feeding Self-Feeding Abilities: Total assist Patient Positioning: Upright in bed Baseline Vocal Quality: Breathy;Hoarse;Low vocal intensity Volitional Cough: Weak Volitional Swallow: Unable to elicit     Oral/Motor/Sensory Function Overall Oral Motor/Sensory Function: Generalized oral weakness   Ice Chips     Thin Liquid Thin Liquid: Impaired Presentation: Cup;Spoon;Straw Oral Phase Impairments: Poor awareness of bolus Oral Phase Functional Implications: Prolonged oral transit;Oral holding Pharyngeal  Phase Impairments: Throat Clearing - Immediate    Nectar Thick Nectar Thick Liquid: Not tested   Honey Thick Honey Thick Liquid: Not tested   Puree Puree: Impaired Presentation: Spoon Oral Phase Impairments: Poor awareness of bolus Oral Phase Functional Implications: Prolonged oral transit   Solid     Solid: Not tested     Herbie [place], MA CCC-SLP  Acute Rehabilitation Services Pager [phone] Office [phone]  Lynann Beaver 10/03/2020,1:56 PM

## 2020-10-03 NOTE — Progress Notes (Addendum)
NAME:  Natalie [name], MRN:  [id number], DOB:  03/15/1956, LOS: 3 ADMISSION DATE:  09/30/2020, CONSULTATION DATE: 09/30/2020 REFERRING MD: Dr. Patsey Berthold, CHIEF COMPLAINT: Altered mental status  History of Present Illness:  65 year old female with COPD and CKD stage IIIa who was referred from [facility] for evaluation of altered mental status.  Pertinent  Medical History   Past Medical History:  Diagnosis Date  . Cataracts, bilateral   . Chickenpox cataracts  . COPD (chronic obstructive pulmonary disease) ([place])   . Depression   . Drug addiction ([place])   . Headache   . Hepatitis C virus infection   . Hepatitis C without hepatic coma   . History of chicken pox   . History of colon polyps   . Seizures ([place])      Significant Hospital Events: Including procedures, antibiotic start and stop dates in addition to other pertinent events     3/25-3/27 Patient presented to [facility] emergency department  with altered mental status, after she was found confused and aphasic covered in stool.  extensive work-up at [facility]  Negative ( CT head & MRI head which showed chronic changes, LP: which showed 5 WBCs and 42% of lymphocyte).  she was started empirically on acyclovir. Patient was noted to have upper extremity posturing intermittently, there was suspicion of a status epilepticus, she was transferred to [facility] for continuous EEG monitoring . 3/27 transfer to [facility]. No Sz noted on LTM. Marland Kitchen 3/28 Started on methadone and clonidine but got hypotensive w/ clonidine req Neo so stopped. Added back Neurontin. Started tube feeds. dc'd foley. Started TFs.  . 3/29: agitated so precedex infusion started. Weaning pressors off. More awake localizing. Has right UE weakness really only wd to pain. Over all though seems more awake. Able to tol PSV but MS not supporting extubation  . 3/30 More awake. Moving all ext. HSV neg so acyclovir stopped. Extubated. Cor trak ordered. Needed restraints. Precedex continued.     Interim History / Subjective:  She passed her SBT so extubated.   Objective   Blood pressure (Abnormal) 143/80, pulse 81, temperature 98.7 F (37.1 C), temperature source Axillary, resp. rate 18, weight 51.1 kg, SpO2 99 %.    Vent Mode: PRVC FiO2 (%):  [30 %-36 %] 36 % Set Rate:  [18 bmp] 18 bmp Vt Set:  [420 mL] 420 mL PEEP:  [5 cmH20] 5 cmH20 Pressure Support:  [8 cmH20] 8 cmH20 Plateau Pressure:  [12 cmH20-13 cmH20] 13 cmH20   Intake/Output Summary (Last 24 hours) at 10/03/2020 0846 Last data filed at 10/03/2020 0800 Gross per 24 hour  Intake 3324.26 ml  Output 1550 ml  Net 1774.26 ml   Filed Weights   10/01/20 0447  Weight: 51.1 kg    Examination: General this is a 65 year old female who is awake today. She is wiggling around in the bed. Rolling back and forth. Today moving all extremities and tracking.  HENT NCAT no JVD. MMM. OETT and OGT both removed Pulm clear no accessory use  Card rrr (tachycardic in low 100s) abd soft not tender had been to TFs Ext warm and dry  Neuro awake. Moves all ext. Today. Tracks. Tries to speak but inaudible   Labs/imaging that I havepersonally reviewed  (right click and "Reselect all SmartList Selections" daily)  See below   Resolved Hospital Problem list   Drug induced hypotension off neo as of 3/30 Hypokalemia  Assessment & Plan:   Acute encephalopathy of unknown etiology:  working dx at this point acute metabolic encephalopathy 2/2 either drug w/d or toxicity +/- post-ictal at one point. Better since resuming gabapentin  Plan Cont keppra Cont gabapentin 300 qd Place FT and cont methadone at 25mg  tid. Her typical dose is 140mg /d but review of clinic notes has her often sleeping thru her nephrology appointments so perhaps lower dose needed.  Cont serial neuro check  Cont precedex for RASS 0 Medical restraints indicated for pt's safety  Acute hypoxic respiratory failure pcxr w/ mild interstitial markings on the left.  Passed  SBT so extubated Plan Wean oxygen Trend fever and wbc curve NPO for now and will prob need swallow eval KVO IVF   CKD stage IIIa Plan trend  Sick Euthyroid Plan F/u tsh after dc     Best Practice (right click and "Reselect all SmartList Selections" daily)   Diet:  Tube Feed  If oral type of diet: see above Pain/Anxiety/Delirium protocol (if indicated): Yes (RASS goal 0) VAP protocol (if indicated): Not indicated DVT prophylaxis: LMWH GI prophylaxis: PPI Glucose control:  SSI Yes Central venous access:  Yes, and it is still needed Arterial line:  N/A Foley:  N/A Restraints ACTION; IS/IS NOT: is Mobility:  bed rest  PT consulted: N/A Blood pressure target: does not have  Studies pending: none today  Culture data pending: none  Last reviewed culture data:today Antibiotics:acyclovir   Antibiotic de-escalation:  ordered  Stop date: off all antimicrobials  Daily labs: ordered Code Status:  full code Prognosis: Serious  Last date of multidisciplinary goals of care discussion [pending ] Keep in ICU   My cct 32 min.   Erick Colace ACNP-BC [place] Pager # [phone] OR # [phone] if no answer

## 2020-10-03 NOTE — Procedures (Signed)
Extubation Procedure Note  Patient Details:   Name: EMONIE ESPERICUETA DOB: 11/23/1955 MRN: [id number]   Airway Documentation:    Vent end date: 10/03/20 Vent end time: 0841   Evaluation  O2 sats: stable throughout Complications: No apparent complications Patient did tolerate procedure well. Bilateral Breath Sounds: Rhonchi,Diminished   Yes, pt could speak post extubation.  Pt extubated to 4 l/m [place] per physician's order.  Earney Navy 10/03/2020, 8:42 AM

## 2020-10-03 NOTE — Progress Notes (Signed)
Notified Marni Griffon, NP UOP = 1900+ over 8 hours. No new orders.

## 2020-10-03 NOTE — Progress Notes (Signed)
Brief Neuro Update:  HSV PCR negative. I discontinued acyclovir.  [place] Pager Number [phone]

## 2020-10-04 ENCOUNTER — Inpatient Hospital Stay (HOSPITAL_COMMUNITY): Payer: BC Managed Care – PPO

## 2020-10-04 DIAGNOSIS — J9601 Acute respiratory failure with hypoxia: Secondary | ICD-10-CM | POA: Diagnosis not present

## 2020-10-04 LAB — GLUCOSE, CAPILLARY
Glucose-Capillary: 110 mg/dL — ABNORMAL HIGH (ref 70–99)
Glucose-Capillary: 166 mg/dL — ABNORMAL HIGH (ref 70–99)
Glucose-Capillary: 114 mg/dL — ABNORMAL HIGH (ref 70–99)
Glucose-Capillary: 121 mg/dL — ABNORMAL HIGH (ref 70–99)
Glucose-Capillary: 124 mg/dL — ABNORMAL HIGH (ref 70–99)
Glucose-Capillary: 141 mg/dL — ABNORMAL HIGH (ref 70–99)

## 2020-10-04 LAB — CBC
HCT: 37.5 % (ref 36.0–46.0)
Hemoglobin: 12.2 g/dL (ref 12.0–15.0)
MCH: 30.7 pg (ref 26.0–34.0)
MCHC: 32.5 g/dL (ref 30.0–36.0)
MCV: 94.2 fL (ref 80.0–100.0)
Platelets: 182 10*3/uL (ref 150–400)
RBC: 3.98 MIL/uL (ref 3.87–5.11)
RDW: 14.7 % (ref 11.5–15.5)
WBC: 7.2 10*3/uL (ref 4.0–10.5)
nRBC: 0 % (ref 0.0–0.2)

## 2020-10-04 LAB — COMPREHENSIVE METABOLIC PANEL
ALT: 30 U/L (ref 0–44)
AST: 34 U/L (ref 15–41)
Albumin: 3 g/dL — ABNORMAL LOW (ref 3.5–5.0)
Alkaline Phosphatase: 56 U/L (ref 38–126)
Anion gap: 5 (ref 5–15)
BUN: 22 mg/dL (ref 8–23)
CO2: 32 mmol/L (ref 22–32)
Calcium: 9.5 mg/dL (ref 8.9–10.3)
Chloride: 101 mmol/L (ref 98–111)
Creatinine, Ser: 0.79 mg/dL (ref 0.44–1.00)
GFR, Estimated: >60 mL/min (ref >60)
Glucose, Bld: 137 mg/dL — ABNORMAL HIGH (ref 70–99)
Potassium: 3.8 mmol/L (ref 3.5–5.1)
Sodium: 138 mmol/L (ref 135–145)
Total Bilirubin: 0.5 mg/dL (ref 0.3–1.2)
Total Protein: 5.9 g/dL — ABNORMAL LOW (ref 6.5–8.1)

## 2020-10-04 MED ORDER — PANTOPRAZOLE SODIUM 40 MG PO PACK
40.0000 mg | PACK | Freq: Every day | ORAL | Status: DC
  Administered 2020-10-05 – 2020-10-11 (×7): 40 mg via ORAL
  Filled 2020-10-04 (×7): qty 20

## 2020-10-04 MED ORDER — METHADONE HCL 10 MG PO TABS
25.0000 mg | ORAL_TABLET | Freq: Three times a day (TID) | ORAL | Status: DC
  Administered 2020-10-04 – 2020-10-11 (×21): 25 mg via ORAL
  Filled 2020-10-04: qty 5
  Filled 2020-10-04 (×11): qty 3
  Filled 2020-10-04: qty 5
  Filled 2020-10-04 (×2): qty 3
  Filled 2020-10-04: qty 5
  Filled 2020-10-04 (×5): qty 3

## 2020-10-04 MED ORDER — DOCUSATE SODIUM 50 MG/5ML PO LIQD
100.0000 mg | Freq: Two times a day (BID) | ORAL | Status: DC | PRN
  Administered 2020-10-04: 100 mg
  Filled 2020-10-04: qty 10

## 2020-10-04 MED ORDER — POLYETHYLENE GLYCOL 3350 17 G PO PACK
17.0000 g | PACK | Freq: Every day | ORAL | Status: DC | PRN
  Administered 2020-10-08: 17 g via ORAL
  Filled 2020-10-04: qty 1

## 2020-10-04 MED ORDER — DOCUSATE SODIUM 50 MG/5ML PO LIQD: 100.0000 mg | Freq: Two times a day (BID) | ORAL | Status: DC | PRN

## 2020-10-04 MED ORDER — RESOURCE THICKENUP CLEAR PO POWD
ORAL | Status: DC | PRN
  Filled 2020-10-04 (×2): qty 125

## 2020-10-04 MED ORDER — LEVETIRACETAM 100 MG/ML PO SOLN: 750.0000 mg | Freq: Two times a day (BID) | ORAL | Status: DC

## 2020-10-04 MED ORDER — POLYETHYLENE GLYCOL 3350 17 G PO PACK
17.0000 g | PACK | Freq: Every day | ORAL | Status: DC | PRN
  Administered 2020-10-04: 17 g
  Filled 2020-10-04: qty 1

## 2020-10-04 MED ORDER — LEVETIRACETAM 750 MG PO TABS
750.0000 mg | ORAL_TABLET | Freq: Two times a day (BID) | ORAL | Status: DC
  Administered 2020-10-04: 750 mg via ORAL
  Filled 2020-10-04 (×3): qty 1

## 2020-10-04 MED ORDER — GABAPENTIN 250 MG/5ML PO SOLN
300.0000 mg | Freq: Every day | ORAL | Status: DC
  Administered 2020-10-05 – 2020-10-06 (×2): 300 mg via ORAL
  Filled 2020-10-04 (×3): qty 6

## 2020-10-04 NOTE — Progress Notes (Signed)
Brief Neuro Update:  Extubated and following commands. Discussed with ICU team. Workup with MRI, LTM, CSF studies are all normal. Neurology will signoff. Please feel free to contact us with any questions or concerns.  [place] Pager Number [phone]

## 2020-10-04 NOTE — Progress Notes (Signed)
  Speech Language Pathology Treatment: Dysphagia  Patient Details Name: Natalie [name] MRN: [id number] DOB: January 05, 1956 Today's Date: 10/04/2020 Time: 0945-1000 SLP Time Calculation (min) (ACUTE ONLY): 15 min  Assessment / Plan / Recommendation Clinical Impression  Pt more alert today in comparison with eval yesterday pm. Pt also now able to achieve audible phonation, though vocal quality consistently wet and increasingly wet with PO intake. Only one cough observed over trials of puree and thin. Recommend instrumental testing to determine ability to start oral diet. NP would like to consider Cortrak removal soon to reduce risk of pulling and aspirating tube feed. Will proceed with MBS.   HPI HPI: 65 year old female with COPD and CKD stage IIIa who was referred from [facility] for evaluation of altered mental status, CT and MRi negative for acute change, LP suggesting of infection, started empirically on acyclovir. Then concern for seizure. Pt intubated from 3/27-3/30.      SLP Plan  MBS       Recommendations  Diet recommendations: NPO                Oral Care Recommendations: Oral care BID Follow up Recommendations: Skilled Nursing facility SLP Visit Diagnosis: Dysphagia, oropharyngeal phase (R13.12) Plan: MBS       GO                Natalie [name], Katherene Ponto 10/04/2020, 11:45 AM

## 2020-10-04 NOTE — Evaluation (Signed)
Physical Therapy Evaluation Patient Details Name: Natalie [name] MRN: [id number] DOB: 10/18/1955 Today's Date: 10/04/2020   History of Present Illness  Pt is a 65 y.o. female who presented to [name] as a transfer from [facility] in which she presented there 3/25 with AMS. Pt actively seizing and intubated 3/25. Extubated October 21, 2022. Of note, pt's husband died 09-12-20 and since then she has been intermittently falling. Urine drug screen was positive for methadone (chronic use) and benzos. CT brain shows chronic lacunar infract but no acute infarct or hemorrhage. EEG consistent with a toxic/metabolic encephalopathy pattern. Workup with MRI, LTM, CSF studies are all normal. PMH: seizures, depression, COPD, bil cataracts, and CKD stage IIIa.   Clinical Impression   Pt presents with condition above and deficits mentioned below, see PT Problem List. Pt is a poor historian and no family members in room at time of eval, thus home information needs to be confirmed. Per pt, she was mod I with all ADLs and functional mobility using crutches, living with her son (who is available 24/7) in a 2nd floor apartment that has an Media planner. Currently, pt demonstrates impaired balance, generalized weakness, incoordination, and impaired cognition. Pt is impulsive and needs continual redirection to remain on task or remember not to stand until PT was prepared. Pt is at high risk for further falls and needs min guard for bed mobility and minA for transfers and short bedroom gait distances with UE support. Will continue to follow acutely Recommending intensive therapies in the CIR setting due to her current level of function being significantly different than her PLOF. At this time pt is unsafe to d/c home alone due to her high risk for falls and impaired cognition.     Follow Up Recommendations CIR;Supervision/Assistance - 24 hour    Equipment Recommendations  Other (comment) (TBD based on confirmation of current  equipment owned)    Recommendations for Other Services Rehab consult;OT consult     Precautions / Restrictions Precautions Precautions: Fall Precaution Comments: seizures; posey belt Restrictions Weight Bearing Restrictions: No      Mobility  Bed Mobility Overal bed mobility: Needs Assistance Bed Mobility: Supine to Sit     Supine to sit: Min guard;HOB elevated     General bed mobility comments: Pt able to sit up with HOB elevated and use of rails with min guard for safety. Pt already sitting up long-sitting in bed upon arrival trying to fling legs out of bed.    Transfers Overall transfer level: Needs assistance Equipment used: 1 person hand held assist Transfers: Sit to/from Stand Sit to Stand: Min assist         General transfer comment: MinA for steadying and powering up to stand with bil UEs on PT anterior to her.  Ambulation/Gait Ambulation/Gait assistance: Min assist Gait Distance (Feet): 10 Feet Assistive device: 1 person hand held assist Gait Pattern/deviations: Step-through pattern;Decreased step length - right;Decreased step length - left;Decreased stride length Gait velocity: reduced Gait velocity interpretation: <1.31 ft/sec, indicative of household ambulator General Gait Details: Pt with bil UEs on PT's arms anterior to her for support. Pt ambulates slowly and unsteadily with poor lower extremity motor planning, needing multi-modal cues to direct. Pt cued to walk to PT but instead tried to step onto PT's feet, poor direction understanding and following. MinA to steady pt due to trunk sway.  Stairs            Wheelchair Mobility    Modified Rankin (Stroke Patients  Only) Modified Rankin (Stroke Patients Only) Pre-Morbid Rankin Score: No symptoms Modified Rankin: Moderately severe disability     Balance Overall balance assessment: Needs assistance Sitting-balance support: No upper extremity supported;Feet supported Sitting balance-Leahy  Scale: Fair Sitting balance - Comments: Static sitting EOB with min guard.   Standing balance support: Bilateral upper extremity supported;During functional activity Standing balance-Leahy Scale: Poor Standing balance comment: Reliant on 1-2 UE support for standing balance.                             Pertinent Vitals/Pain Pain Assessment: Faces Faces Pain Scale: No hurt Pain Intervention(s): Monitored during session    Home Living Family/patient expects to be discharged to:: Private residence Living Arrangements: Children (son) Available Help at Discharge: Family;Available 24 hours/day (son works from home) Type of Home: Apartment Home Access: [place]: Two level;Bed/bath upstairs;Able to live on main level with bedroom/bathroom Home Equipment: Gilford Rile - 2 wheels;Crutches;Bedside commode;Shower seat;Grab bars - toilet;Grab bars - tub/shower;Wheelchair - manual;Hospital bed Additional Comments: Unsure how much is accurate, pt is poor historian. She states her son has been living with her, even before her significant other passed away.    Prior Function Level of Independence: Independent with assistive device(s)         Comments: One moment pt states her son helps her and then the next she says she is independent. Pt states she drives and uses crtuches to ambulate majority of time and sometimes a RW. Unsure of accuracy as pt is poor historian.     Hand Dominance        Extremity/Trunk Assessment   Upper Extremity Assessment Upper Extremity Assessment: Generalized weakness    Lower Extremity Assessment Lower Extremity Assessment: RLE deficits/detail;LLE deficits/detail RLE Deficits / Details: MMT scores of 4 to 4+ grossly RLE Sensation:  (denies numbness/tingling) RLE Coordination: decreased fine motor;decreased gross motor LLE Deficits / Details: MMT scores of 4 to 4+ grossly LLE Sensation:  (denies numbness/tingling) LLE Coordination:  decreased fine motor;decreased gross motor    Cervical / Trunk Assessment Cervical / Trunk Assessment: Kyphotic  Communication   Communication: No difficulties  Cognition Arousal/Alertness: Awake/alert Behavior During Therapy: Flat affect;Impulsive;Restless Overall Cognitive Status: Impaired/Different from baseline Area of Impairment: Orientation;Attention;Memory;Following commands;Safety/judgement;Awareness;Problem solving                 Orientation Level: Disoriented to;Time;Situation (year is "8" and month is "June") Current Attention Level: Selective Memory: Decreased short-term memory;Decreased recall of precautions Following Commands: Follows one step commands consistently;Follows one step commands with increased time;Follows multi-step commands inconsistently Safety/Judgement: Decreased awareness of safety;Decreased awareness of deficits Awareness: Intellectual Problem Solving: Slow processing;Difficulty sequencing;Requires verbal cues;Requires tactile cues General Comments: No family present to confirm prior cognition, but per chart she appears to not be at her baseline. Disoriented to time and situation but oriented to self and specific hospital. Pt with poor attention span, needing redirection to remain on task often as she is impulsive and fidgety trying to get up even when just cued to remain sitting and she confirmed she understood. Poor sequencing and understanding of cues as she has difficulty with simple commands to tap feet simultanously and instead tries to tap PT's feet and when cued to walk anterior to PT she tried to take steps onto PT's feet.      General Comments General comments (skin integrity, edema, etc.): BP 159/92 end of session, unable to get BP prior  to start due to pt fidgeting, RN aware    Exercises     Assessment/Plan    PT Assessment Patient needs continued PT services  PT Problem List Decreased strength;Decreased range of motion;Decreased  activity tolerance;Decreased balance;Decreased mobility;Decreased coordination;Decreased cognition;Decreased knowledge of use of DME;Decreased safety awareness;Decreased knowledge of precautions       PT Treatment Interventions DME instruction;Gait training;Stair training;Functional mobility training;Therapeutic activities;Therapeutic exercise;Balance training;Neuromuscular re-education;Cognitive remediation;Patient/family education    PT Goals (Current goals can be found in the Care Plan section)  Acute Rehab PT Goals Patient Stated Goal: to get up PT Goal Formulation: With patient Time For Goal Achievement: 10/18/20 Potential to Achieve Goals: Good    Frequency Min 3X/week   Barriers to discharge        Co-evaluation               AM-PAC PT "6 Clicks" Mobility  Outcome Measure Help needed turning from your back to your side while in a flat bed without using bedrails?: A Little Help needed moving from lying on your back to sitting on the side of a flat bed without using bedrails?: A Little Help needed moving to and from a bed to a chair (including a wheelchair)?: A Little Help needed standing up from a chair using your arms (e.g., wheelchair or bedside chair)?: A Little Help needed to walk in hospital room?: A Little Help needed climbing 3-5 steps with a railing? : A Lot 6 Click Score: 17    End of Session Equipment Utilized During Treatment: Gait belt Activity Tolerance: Patient tolerated treatment well Patient left: in chair;with call bell/phone within reach;with chair alarm set;with restraints reapplied Nurse Communication: Mobility status;Other (comment) (BP) PT Visit Diagnosis: Unsteadiness on feet (R26.81);Other abnormalities of gait and mobility (R26.89);Muscle weakness (generalized) (M62.81);History of falling (Z91.81);Difficulty in walking, not elsewhere classified (R26.2)    Time: 1157-2620 PT Time Calculation (min) (ACUTE ONLY): 31 min   Charges:   PT  Evaluation $PT Eval Moderate Complexity: 1 Mod PT Treatments $Therapeutic Activity: 8-22 mins        Moishe Spice, PT, DPT Acute Rehabilitation Services  Pager: [phone] Office: [phone]   Orvan Falconer 10/04/2020, 5:29 PM

## 2020-10-04 NOTE — Progress Notes (Signed)
NAME:  Natalie [name], MRN:  [id number], DOB:  [birth year], LOS: 4 ADMISSION DATE:  09/30/2020, CONSULTATION DATE: 09/30/2020 REFERRING MD: Dr. Patsey Berthold, CHIEF COMPLAINT: Altered mental status  History of Present Illness:  65 year old female with COPD and CKD stage IIIa who was referred from [facility] for evaluation of altered mental status.  Pertinent  Medical History   Past Medical History:  Diagnosis Date  . Cataracts, bilateral   . Chickenpox cataracts  . COPD (chronic obstructive pulmonary disease) ([place])   . Depression   . Drug addiction ([place])   . Headache   . Hepatitis C virus infection   . Hepatitis C without hepatic coma   . History of chicken pox   . History of colon polyps   . Seizures ([place])      Significant Hospital Events: Including procedures, antibiotic start and stop dates in addition to other pertinent events     3/25-3/27 Patient presented to [facility] emergency department  with altered mental status, after she was found confused and aphasic covered in stool.  extensive work-up at [facility]  Negative ( CT head & MRI head which showed chronic changes, LP: which showed 5 WBCs and 42% of lymphocyte).  she was started empirically on acyclovir. Patient was noted to have upper extremity posturing intermittently, there was suspicion of a status epilepticus, she was transferred to [facility] for continuous EEG monitoring . 3/27 transfer to [facility]. No Sz noted on LTM. Marland Kitchen 3/28 Started on methadone and clonidine but got hypotensive w/ clonidine req Neo so stopped. Added back Neurontin. Started tube feeds. dc'd foley. Started TFs.  . 3/29: agitated so precedex infusion started. Weaning pressors off. More awake localizing. Has right UE weakness really only wd to pain. Over all though seems more awake. Able to tol PSV but MS not supporting extubation  . 3/30 More awake. Moving all ext. HSV neg so acyclovir stopped. Extubated. Cor trak Placed. Needed restraints. Precedex continued.   Seen by SLP but pt could not be successfully cued to sip water and was felt too lethargic for oral diet . 3/31 sp clear. Oriented X 2. Strength good. Still needing frequent re-direction. Bedside sitter ordered w/ plan to stop precedex. SLP, PT ordered. OOB.   Interim History / Subjective:  She looks so much better. Still confused and needs redirection but so much better  Objective   Blood pressure 135/75, pulse (Abnormal) 102, temperature 99 F (37.2 C), temperature source Oral, resp. rate 10, weight 51.1 kg, SpO2 98 %.    FiO2 (%):  [28 %] 28 %   Intake/Output Summary (Last 24 hours) at 10/04/2020 0858 Last data filed at 10/04/2020 0800 Gross per 24 hour  Intake 1937.08 ml  Output 2250 ml  Net -312.92 ml   Filed Weights   10/01/20 0447  Weight: 51.1 kg    Examination: General this is a 65 year old female she remains restless and fidgety but continues to improve day-by-day HENT NCAT no JVD MMM Pulm clear and on room air Card rrr abd soft not tender Ext warm and dry  Neuro awake. Sp now clear. Oriented x2. Moves all ext w/ equal strength.  GU inc of urine  Labs/imaging that I havepersonally reviewed  (right click and "Reselect all SmartList Selections" daily)  See below   Resolved Hospital Problem list   Drug induced hypotension off neo as of 3/30 Hypokalemia  Acute hypoxic respiratory failure extubated 3/30 Assessment & Plan:   Acute encephalopathy of unknown etiology:  working dx at this point acute metabolic encephalopathy 2/2 either drug w/d or toxicity +/- post-ictal at one point. 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  If oral type of diet: see above Pain/Anxiety/Delirium protocol (if indicated): Yes (RASS goal 0) ->if can take orals we can dc precedex VAP protocol (if indicated): Not indicated DVT prophylaxis: LMWH GI prophylaxis: PPI Glucose control:  SSI Yes Central venous access:  Yes, and it is still needed Arterial line:  N/A Foley:  N/A Restraints ACTION; IS/IS NOT: is Mobility:  OOB  PT consulted: Yes Blood pressure target: does not have  Studies pending: SLP eval +/- MBS Culture data pending: none  Last reviewed culture data:today Antibiotics: NA  Antibiotic de-escalation:  ordered  Stop date: off all antimicrobials  Daily labs: not indicated Code Status:  full code Prognosis: Serious and Not life-threatening  Last date of multidisciplinary goals of care discussion [place].Antu ] Keep in ICU   My cct NA   Erick Colace ACNP-BC [place] Pager # [phone] OR # [phone] if no answer

## 2020-10-04 NOTE — Progress Notes (Signed)
Modified Barium Swallow Progress Note  Patient Details  Name: Natalie [name] MRN: [id number] Date of Birth: Dec 31, 1955  Today's Date: 10/04/2020  Modified Barium Swallow completed.  Full report located under Chart Review in the Imaging Section.  Brief recommendations include the following:  Clinical Impression  Pt demonstrates a mild oral dysphagia with decreased ability to masticate (unsure if pt uses dentures at baseline, edentulous during assessment) and pocketing of unmasticated solids. Pt has instances of silent aspiration just before adequate laryngeal closure is achieved with thin liquids regardless of bolus size. Cued cough ineffective. Pt could not achieve compensatory positions/strategies. Nectar thick liquids are tolerated well and nectar and dys 2 diet is recommended. Will f/u.   Swallow Evaluation Recommendations       SLP Diet Recommendations: Dysphagia 2 (Fine chop) solids;Nectar thick liquid   Liquid Administration via: Cup;Straw   Medication Administration: Whole meds with liquid   Supervision: Patient able to self feed   Compensations: Slow rate;Small sips/bites   Postural Changes: Seated upright at 90 degrees            Karlin Heilman, Katherene Ponto 10/04/2020,2:29 PM

## 2020-10-04 NOTE — Evaluation (Signed)
Speech Language Pathology Evaluation Patient Details Name: Natalie [name] MRN: [id number] DOB: 10-03-1955 Today's Date: 10/04/2020 Time: 0945-1000 SLP Time Calculation (min) (ACUTE ONLY): 15 min  Problem List:  Patient Active Problem List   Diagnosis Date Noted  . Malnutrition of moderate degree 10/01/2020  . Pressure injury of skin 09/30/2020  . Encephalopathy acute 09/30/2020  . Altered mental status   . Respiratory failure ([place])   . Encephalopathy 09/28/2020  . Status epilepticus ([place])   . NSTEMI (non-ST elevated myocardial infarction) ([place]) 07/24/2017  . Cataracts, bilateral 03/30/2017  . COPD (chronic obstructive pulmonary disease) ([place]) 03/30/2017  . Depression 03/30/2017  . Drug addiction ([place]) 03/30/2017  . Seizures ([place]) 03/30/2017  . Chronic migraine without aura without status migrainosus, not intractable 07/26/2014  . 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 CKD (chronic kidney disease) stage 3, GFR 30-59 ml/min (HCC) 08/04/2013  . Edema 03/24/2011  . Tobacco use disorder 03/24/2011  . 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 Migraine headache 01/10/2011   Past Medical History:  Past Medical History:  Diagnosis Date  . Cataracts, bilateral   . Chickenpox cataracts  . COPD (chronic obstructive pulmonary disease) ([place])   . Depression   . Drug addiction ([place])   . Headache   . Hepatitis C virus infection   . Hepatitis C without hepatic coma   . History of chicken pox   . History of colon polyps   . Seizures ([place])    Past Surgical History:  Past Surgical History:  Procedure Laterality Date  . APPENDECTOMY    . CHOLECYSTECTOMY    . COLONOSCOPY WITH PROPOFOL N/A 04/30/2015   Procedure: COLONOSCOPY WITH PROPOFOL;  Surgeon: Josefine Class, MD;  Location: [facility] ENDOSCOPY;  Service: Endoscopy;  Laterality: N/A;  . COLONOSCOPY WITH PROPOFOL N/A 07/13/2017   Procedure: COLONOSCOPY WITH PROPOFOL;  Surgeon: Manya Silvas, MD;  Location[facility] ENDOSCOPY;  Service: Endoscopy;  Laterality: N/A;  . ESOPHAGOGASTRODUODENOSCOPY (EGD) WITH PROPOFOL N/A 07/13/2017   Procedure: ESOPHAGOGASTRODUODENOSCOPY (EGD) WITH PROPOFOL;  Surgeon: Manya Silvas, MD;  Location: [facility] ENDOSCOPY;  Service: Endoscopy;  Laterality: N/A;  . EYE SURGERY     HPI:  65 year old female with COPD and CKD stage IIIa who was referred from [facility] for evaluation of altered mental status, CT and MRi negative for acute change, LP suggesting of infection, started empirically on acyclovir. Then concern for seizure. Pt intubated from 3/27-3/30.   Assessment / Plan / Recommendation Clinical Impression  Pt demonstrates cognitive linguistic impairments impacting functional communication ability. Pt can participate in short conversations about needs or about biographical information. In confrontation tasks she is highly perseverative, making her accuracy with following commands, naming and yes no questions very poor. Her auditory comprehension is likely better than it appears due to this. With more open ended descriptive questions, pt quickly descends into language of confusion and often stops talking with some awareness of being lost in her purpose. She will do better with purposeful functional tasks with visual and contextual cues than she will with verbal only directions. Will continue efforts. May benefit [facility] at [facility].    SLP Assessment  SLP Recommendation/Assessment: Patient needs continued Speech Lanaguage Pathology Services SLP Visit Diagnosis: Cognitive communication deficit (R41.841)    Follow Up Recommendations  Inpatient Rehab    Frequency and Duration min 2x/week  2 weeks      SLP Evaluation Cognition  Overall Cognitive Status: Impaired/Different from baseline Arousal/Alertness: Awake/alert Orientation Level: Oriented to  person;Oriented to place;Disoriented to situation;Disoriented to time Attention: Focused;Sustained Focused  Attention: Appears intact Sustained Attention: Impaired Sustained Attention Impairment: Verbal basic Memory: Impaired Memory Impairment: Storage deficit;Retrieval deficit;Decreased recall of new information Awareness: Impaired Awareness Impairment: Intellectual impairment;Emergent impairment Problem Solving: Impaired Problem Solving Impairment: Verbal basic;Functional basic Safety/Judgment: Impaired       Comprehension  Auditory Comprehension Overall Auditory Comprehension: Impaired Yes/No Questions: Impaired Basic Immediate Environment Questions: 0-24% accurate Commands: Impaired One Step Basic Commands: 0-24% accurate Conversation: Simple Interfering Components:  (perseveration)    Expression Verbal Expression Overall Verbal Expression: Impaired Initiation: No impairment Automatic Speech: Counting;Social Response;Day of week Level of Generative/Spontaneous Verbalization: Phrase Repetition: No impairment Naming: Impairment Responsive: Not tested Confrontation: Impaired Convergent: Not tested Divergent: 0-24% accurate Verbal Errors: Perseveration;Not aware of errors;Language of confusion   Oral / Motor  Oral Motor/Sensory Function Overall Oral Motor/Sensory Function: Within functional limits Motor Speech Overall Motor Speech: Appears within functional limits for tasks assessed   GO                   Herbie Baltimore, MA CCC-SLP  Acute Rehabilitation Services Pager [phone] Office [phone]  Lynann Beaver 10/04/2020, 12:01 PM

## 2020-10-05 DIAGNOSIS — N1831 Chronic kidney disease, stage 3a: Secondary | ICD-10-CM

## 2020-10-05 DIAGNOSIS — E440 Moderate protein-calorie malnutrition: Secondary | ICD-10-CM

## 2020-10-05 DIAGNOSIS — G9340 Encephalopathy, unspecified: Secondary | ICD-10-CM | POA: Diagnosis not present

## 2020-10-05 LAB — GLUCOSE, CAPILLARY
Glucose-Capillary: 107 mg/dL — ABNORMAL HIGH (ref 70–99)
Glucose-Capillary: 123 mg/dL — ABNORMAL HIGH (ref 70–99)
Glucose-Capillary: 150 mg/dL — ABNORMAL HIGH (ref 70–99)

## 2020-10-05 MED ORDER — LEVETIRACETAM 100 MG/ML PO SOLN
750.0000 mg | Freq: Two times a day (BID) | ORAL | Status: DC
  Administered 2020-10-05 – 2020-10-06 (×3): 750 mg via ORAL
  Filled 2020-10-05 (×3): qty 10

## 2020-10-05 MED ORDER — IPRATROPIUM-ALBUTEROL 0.5-2.5 (3) MG/3ML IN SOLN
3.0000 mL | Freq: Two times a day (BID) | RESPIRATORY_TRACT | Status: DC
  Administered 2020-10-05 – 2020-10-10 (×9): 3 mL via RESPIRATORY_TRACT
  Filled 2020-10-05 (×8): qty 3

## 2020-10-05 MED FILL — Medication: Qty: 1 | Status: AC

## 2020-10-05 NOTE — Progress Notes (Signed)
NAME:  Natalie [name], MRN:  [id number], DOB:  [birth year], LOS: 5 ADMISSION DATE:  09/30/2020, CONSULTATION DATE: 09/30/2020 REFERRING MD: Dr. Patsey Berthold, CHIEF COMPLAINT: Altered mental status  History of Present Illness:  65 year old female with COPD and CKD stage IIIa who was referred from [facility] for evaluation of altered mental status.  Pertinent  Medical History   Past Medical History:  Diagnosis Date  . Cataracts, bilateral   . Chickenpox cataracts  . COPD (chronic obstructive pulmonary disease) ([place])   . Depression   . Drug addiction ([place])   . Headache   . Hepatitis C virus infection   . Hepatitis C without hepatic coma   . History of chicken pox   . History of colon polyps   . Seizures ([place])      Significant Hospital Events: Including procedures, antibiotic start and stop dates in addition to other pertinent events     3/25-3/27 Patient presented to [facility] emergency department  with altered mental status, after she was found confused and aphasic covered in stool.  extensive work-up at [facility]  Negative ( CT head & MRI head which showed chronic changes, LP: which showed 5 WBCs and 42% of lymphocyte).  she was started empirically on acyclovir. Patient was noted to have upper extremity posturing intermittently, there was suspicion of a status epilepticus, she was transferred to [facility] for continuous EEG monitoring . 3/27 transfer to [facility]. No Sz noted on LTM. Marland Kitchen 3/28 Started on methadone and clonidine but got hypotensive w/ clonidine req Neo so stopped. Added back Neurontin. Started tube feeds. dc'd foley. Started TFs.  . 3/29: agitated so precedex infusion started. Weaning pressors off. More awake localizing. Has right UE weakness really only wd to pain. Over all though seems more awake. Able to tol PSV but MS not supporting extubation  . 3/30 More awake. Moving all ext. HSV neg so acyclovir stopped. Extubated. Cor trak Placed. Needed restraints. Precedex continued.   Seen by SLP but pt could not be successfully cued to sip water and was felt too lethargic for oral diet . 3/31 sp clear. Oriented X 2. Strength good. Still needing frequent re-direction. Bedside sitter ordered & stopped precedex. SLP, PT ordered. OOB. MBS completed. Started Dysphagia thick diet w/ fine chop solids and nectar thick liquids. The patient pulled out her Feeding tube and CVL.  Marland Kitchen 4/1 alert and oriented x 3 still impulsive but better   Interim History / Subjective:   Wants to go home but still very weak and impulsive  Objective   Blood pressure 123/81, pulse 94, temperature 99.3 F (37.4 C), temperature source Oral, resp. rate 20, weight 47.7 kg, SpO2 94 %.        Intake/Output Summary (Last 24 hours) at 10/05/2020 0824 Last data filed at 10/05/2020 0600 Gross per 24 hour  Intake 179.17 ml  Output 1025 ml  Net -845.83 ml   Filed Weights   10/01/20 0447 10/05/20 0500  Weight: 51.1 kg 47.7 kg    Examination:  General this is a 65 year old female she is resting in bed and in no distress HENT NCAT no JVD pulm clear Card rrr abd soft  Ext warm and dry  Neuro oriented x 3 today. Moves all ext. Still impulsive  gu cl yellow  Labs/imaging that I havepersonally reviewed  (right click and "Reselect all SmartList Selections" daily)  See below   Resolved Hospital Problem list   Drug induced hypotension off neo as of 3/30  Hypokalemia  Acute hypoxic respiratory failure extubated 3/30 Assessment & Plan:   Acute encephalopathy of unknown etiology: working dx at this point acute metabolic encephalopathy 2/2 either drug w/d or toxicity +/- post-ictal at one point. Better w/ time and continuing home meds Plan Cont Keppra Cont Gabapentin 300mg /d Cont methadone at 25mg  tid (we did not resume the 140mg  d/ seems to be improving on lower dose and out-pt review of notes suggest perhaps that dosing may have been a little higher than needed.  Will need sitter    CKD stage  IIIa Plan Am chem  Sick Euthyroid Plan F/u outpt chem    Best Practice (right click and "Reselect all SmartList Selections" daily)   Diet:  Oral  If oral type of diet: dysphagia; thin chopped. Nectar thick  Pain/Anxiety/Delirium protocol (if indicated): No ->if can take orals we can dc precedex VAP protocol (if indicated): Not indicated DVT prophylaxis: LMWH GI prophylaxis: PPI Glucose control:  SSI No Central venous access:  N/A Arterial line:  N/A Foley:  N/A Restraints ACTION; IS/IS NOT: is Mobility:  OOB  PT consulted: Yes Blood pressure target: does not have  Studies pending: none Culture data pending: none  Last reviewed culture data:today Antibiotics: NA  Antibiotic de-escalation:  ordered  Stop date: off all antimicrobials  Daily labs: not indicated Code Status:  full code Prognosis: Not life-threatening  Last date of multidisciplinary goals of care discussion [pending ] Move out of ICU   My cct NA   Erick Colace ACNP-BC [place] Pager # [phone] OR # [phone] if no answer

## 2020-10-05 NOTE — Progress Notes (Signed)
  Speech Language Pathology Treatment: Dysphagia;Cognitive-Linquistic  Patient Details Name: Natalie [name] MRN: [id number] DOB: 02/24/1956 Today's Date: 10/05/2020 Time: 1275-1700 SLP Time Calculation (min) (ACUTE ONLY): 13 min  Assessment / Plan / Recommendation Clinical Impression  Pt seen with lunch meal. IS not attempting much of the dys2 texture which she feels is too difficult to masticate. Pt is able to take small sips of nectar thick liquids and purees without coughing, though intermittent wet vocal quality is noted, consistent with MBS. Will downgrade diet to purees and nectar, which pt finds agreeable. Pt much more fluent today, but phonemic errors and perseverations persist in confrontational naming and conversation. Used visual cues and comparisons to increase awareness and elicit self correction. Continue to recommend CIR at d/c.    HPI HPI: 65 year old female with COPD and CKD stage IIIa who was referred from [facility] for evaluation of altered mental status, CT and MRi negative for acute change, LP suggesting of infection, started empirically on acyclovir. Then concern for seizure. Pt intubated from 3/27-3/30.      SLP Plan  Continue with current plan of care       Recommendations  Diet recommendations: Dysphagia 1 (puree);Nectar-thick liquid Medication Administration: Crushed with puree Supervision: Patient able to self feed Compensations: Slow rate;Small sips/bites Postural Changes and/or Swallow Maneuvers: Seated upright 90 degrees                General recommendations: Rehab consult Oral Care Recommendations: Oral care BID Follow up Recommendations: Inpatient Rehab Plan: Continue with current plan of care       GO               Natalie [place], MA [place] Pager [phone] Office [phone]  Natalie [name] 10/05/2020, 2:33 PM

## 2020-10-05 NOTE — Progress Notes (Signed)
Informed pt's son Rodman Key that pt is moving to [address]. All questions answered at this time.

## 2020-10-05 NOTE — Progress Notes (Signed)
Physical Therapy Treatment Patient Details Name: Natalie [name] MRN: [id number] DOB: 01-10-1956 Today's Date: 10/05/2020    History of Present Illness 65 y.o. female who presented to [name] 3/27 as a transfer from [facility] in which she presented there 3/25 with AMS. Pt actively seizing and intubated 3/25. Extubated 10-25-2022. Of note, pt's husband died 2020/09/16 and since then she has been intermittently falling. Urine drug screen was positive for methadone (chronic use) and benzos. CT brain shows chronic lacunar infract but no acute infarct or hemorrhage. EEG consistent with a toxic/metabolic encephalopathy pattern. Workup with MRI, LTM, CSF studies are all normal. PMH: seizures, depression, COPD, bil cataracts, and CKD stage IIIa.    PT Comments    Pt demonstrating improved activity tolerance and balance this date, ambulating within the room with a RW and minA this date. Pt A&Ox4 at this time, but demonstrates STM deficits, poor attention span, and poor awareness into her deficits/safety. Pt needing repeated cues to widen her gait stance and remain proximal to her RW with her quickly forgetting and reverting back to her previous techniques. Pt with intermittent LOB bouts, needing minA to recover. Pt is at risk for falls and would be unsafe to be home alone. Pt changed her details about her home to that she lives in a mobile home with 2-3 STE. Will need to confirm home set-up and available assistance and equipment at d/c. Will continue to follow acutely. Current recommendations remain appropriate.   Follow Up Recommendations  CIR;Supervision/Assistance - 24 hour     Equipment Recommendations  Other (comment) (TBD based on confirmation of current equipment owned)    Recommendations for Other Services       Precautions / Restrictions Precautions Precautions: Fall Precaution Comments: seizures; posey belt Restrictions Weight Bearing Restrictions: No    Mobility  Bed Mobility Overal  bed mobility: Needs Assistance Bed Mobility: Supine to Sit;Sit to Supine     Supine to sit: Min guard;HOB elevated Sit to supine: Min guard   General bed mobility comments: Pt able to sit up with HOB elevated and use of rails with min guard for safety. Pt already sitting up long-sitting in bed upon arrival trying to fling legs out of bed.    Transfers Overall transfer level: Needs assistance Equipment used: Rolling walker (2 wheeled) Transfers: Sit to/from Stand Sit to Stand: Min assist         General transfer comment: MinA for steadying and powering up to stand with bil UEs on PT anterior to her.  Ambulation/Gait Ambulation/Gait assistance: Min assist Gait Distance (Feet): 150 Feet (x3 bouts of ~30 ft > ~150 ft > ~100 ft) Assistive device: Rolling walker (2 wheeled) Gait Pattern/deviations: Step-through pattern;Decreased step length - right;Decreased step length - left;Decreased stride length;Narrow base of support Gait velocity: reduced Gait velocity interpretation: <1.31 ft/sec, indicative of household ambulator General Gait Details: Pt ambulating with very narrow, almost heel-to-toe, BOS. Cues provided verbally and visually through placing PT's foot between her feet to demonstrate the stance width they should be. Pt able to maintain for up to ~5 steps then reverts back to narrow stance. Intermittent lateral LOB, needing minA to recover. Poor RW management, despite cues.   Stairs             Wheelchair Mobility    Modified Rankin (Stroke Patients Only) Modified Rankin (Stroke Patients Only) Pre-Morbid Rankin Score: No symptoms Modified Rankin: Moderately severe disability     Balance Overall balance assessment: Needs assistance Sitting-balance  support: No upper extremity supported;Feet supported Sitting balance-Leahy Scale: Fair Sitting balance - Comments: Static sitting EOB with min guard.   Standing balance support: Bilateral upper extremity  supported;During functional activity Standing balance-Leahy Scale: Poor Standing balance comment: Reliant on 1-2 UE support for standing balance.                            Cognition Arousal/Alertness: Awake/alert Behavior During Therapy: WFL for tasks assessed/performed Overall Cognitive Status: Impaired/Different from baseline Area of Impairment: Attention;Memory;Following commands;Safety/judgement;Awareness;Problem solving;Orientation                 Orientation Level: Disoriented to;Time;Situation (year is "1999") Current Attention Level: Sustained Memory: Decreased short-term memory;Decreased recall of precautions Following Commands: Follows one step commands consistently;Follows one step commands with increased time;Follows multi-step commands inconsistently Safety/Judgement: Decreased awareness of safety;Decreased awareness of deficits Awareness: Intellectual Problem Solving: Slow processing;Difficulty sequencing;Requires verbal cues;Requires tactile cues General Comments: Pt with decreased orientation and stating the year is 1999 despite cues to re-orient. Pt with decreased ST memory, awareness, and following of commands. When following commnads to write on white board; pt requiring Max cues to locate white board. When asking pt "to say hi" on write board, pt wrote her name. Then providing direct commands to write hello and she did so.      Exercises      General Comments General comments (skin integrity, edema, etc.): VSS on RA      Pertinent Vitals/Pain Pain Assessment: Faces Faces Pain Scale: No hurt Pain Intervention(s): Monitored during session    Home Living Family/patient expects to be discharged to:: Private residence Living Arrangements: Children (son) Available Help at Discharge: Family;Available 24 hours/day (son works from home) Type of Home: Apartment Home Access: [place]: Two level;Bed/bath upstairs;Able to live on main  level with bedroom/bathroom Home Equipment: Gilford Rile - 2 wheels;Crutches;Bedside commode;Shower seat;Grab bars - toilet;Grab bars - tub/shower;Wheelchair - manual;Hospital bed Additional Comments: Unsure how much is accurate, pt is poor historian. She states her son has been living with her, even before her significant other passed away.    Prior Function Level of Independence: Independent with assistive device(s)      Comments: One moment pt states her son helps her and then the next she says she is independent. Pt states she drives and uses crtuches to ambulate majority of time and sometimes a RW. Unsure of accuracy as pt is poor historian.   PT Goals (current goals can now be found in the care plan section) Acute Rehab PT Goals Patient Stated Goal: to walk PT Goal Formulation: With patient Time For Goal Achievement: 10/18/20 Potential to Achieve Goals: Good Progress towards PT goals: Progressing toward goals    Frequency    Min 3X/week      PT Plan Current plan remains appropriate    Co-evaluation              AM-PAC PT "6 Clicks" Mobility   Outcome Measure  Help needed turning from your back to your side while in a flat bed without using bedrails?: A Little Help needed moving from lying on your back to sitting on the side of a flat bed without using bedrails?: A Little Help needed moving to and from a bed to a chair (including a wheelchair)?: A Little Help needed standing up from a chair using your arms (e.g., wheelchair or bedside chair)?: A Little Help needed to walk in hospital room?:  A Little Help needed climbing 3-5 steps with a railing? : A Lot 6 Click Score: 17    End of Session Equipment Utilized During Treatment: Gait belt Activity Tolerance: Patient tolerated treatment well Patient left: with call bell/phone within reach;with restraints reapplied;in bed;with bed alarm set Nurse Communication: Mobility status PT Visit Diagnosis: Unsteadiness on feet  (R26.81);Other abnormalities of gait and mobility (R26.89);Muscle weakness (generalized) (M62.81);History of falling (Z91.81);Difficulty in walking, not elsewhere classified (R26.2)     Time: 5573-2202 PT Time Calculation (min) (ACUTE ONLY): 29 min  Charges:  $Gait Training: 23-37 mins                     Moishe Spice, PT, DPT Acute Rehabilitation Services  Pager: [phone] Office: [place] 10/05/2020, 2:52 PM

## 2020-10-05 NOTE — Progress Notes (Signed)
Inpatient Rehab Admissions Coordinator Note:   Per therapy recommendations, pt was screened for CIR candidacy by Shann Medal, PT, DPT.  At this time we are recommending a CIR consult.  If pt would like to be considered for admit, please place a IP Rehab MD consult order.  Please contact me with questions.   Shann Medal, PT, DPT [phone] 10/05/20 10:59 AM

## 2020-10-05 NOTE — Evaluation (Signed)
Occupational Therapy Evaluation Patient Details Name: Natalie [name] MRN: [id number] DOB: December 07, 1955 Today's Date: 10/05/2020    History of Present Illness 65 y.o. female who presented to [name] 3/27 as a transfer from [facility] in which she presented there 3/25 with AMS. Pt actively seizing and intubated 3/25. Extubated 10/31/2022. Of note, pt's husband died 09/22/20 and since then she has been intermittently falling. Urine drug screen was positive for methadone (chronic use) and benzos. CT brain shows chronic lacunar infract but no acute infarct or hemorrhage. EEG consistent with a toxic/metabolic encephalopathy pattern. Workup with MRI, LTM, CSF studies are all normal. PMH: seizures, depression, COPD, bil cataracts, and CKD stage IIIa.   Clinical Impression   PTA, pt was living with her son and was independent. Pt currently requiring Min A for ADLs and functional mobility with use of RW. Pt presenting with poor balance, cognition, and safety. Pt requiring cues throughout for problem solving, awareness, and safety. Pt would benefit from further acute OT to facilitate safe dc. Recommend dc to CIR for intensive OT to optimize safety, independence with ADLs, and return to PLOF.     Follow Up Recommendations  CIR    Equipment Recommendations  None recommended by OT    Recommendations for Other Services PT consult;Rehab consult;Speech consult     Precautions / Restrictions Precautions Precautions: Fall Precaution Comments: seizures; posey belt Restrictions Weight Bearing Restrictions: No      Mobility Bed Mobility Overal bed mobility: Needs Assistance Bed Mobility: Supine to Sit;Sit to Supine     Supine to sit: Min guard;HOB elevated Sit to supine: Min guard   General bed mobility comments: Pt able to sit up with HOB elevated and use of rails with min guard for safety. Pt already sitting up long-sitting in bed upon arrival trying to fling legs out of bed.    Transfers Overall  transfer level: Needs assistance Equipment used: Rolling walker (2 wheeled) Transfers: Sit to/from Stand Sit to Stand: Min assist         General transfer comment: MinA for steadying and powering up to stand with bil UEs on PT anterior to her.    Balance Overall balance assessment: Needs assistance Sitting-balance support: No upper extremity supported;Feet supported Sitting balance-Leahy Scale: Fair Sitting balance - Comments: Static sitting EOB with min guard.   Standing balance support: Bilateral upper extremity supported;During functional activity Standing balance-Leahy Scale: Poor Standing balance comment: Reliant on 1-2 UE support for standing balance.                           ADL either performed or assessed with clinical judgement   ADL Overall ADL's : Needs assistance/impaired Eating/Feeding: Supervision/ safety;Set up;Sitting   Grooming: Supervision/safety;Set up;Sitting   Upper Body Bathing: Minimal assistance;Sitting   Lower Body Bathing: Minimal assistance;Sit to/from stand   Upper Body Dressing : Minimal assistance;Sitting   Lower Body Dressing: Minimal assistance;Sit to/from stand   Toilet Transfer: Minimal assistance;RW;Ambulation (simulated to reclienr)           Functional mobility during ADLs: Minimal assistance;Rolling walker General ADL Comments: Pt presenting with decreased balance and cognition impacting her safe performance of ADLS     Vision         Perception     Praxis      Pertinent Vitals/Pain Pain Assessment: Faces Faces Pain Scale: No hurt Pain Intervention(s): Monitored during session     Hand Dominance Right  Extremity/Trunk Assessment Upper Extremity Assessment Upper Extremity Assessment: Generalized weakness   Lower Extremity Assessment RLE Deficits / Details: MMT scores of 4 to 4+ grossly RLE Sensation:  (denies numbness/tingling) RLE Coordination: decreased fine motor;decreased gross motor LLE  Deficits / Details: MMT scores of 4 to 4+ grossly LLE Sensation:  (denies numbness/tingling) LLE Coordination: decreased fine motor;decreased gross motor   Cervical / Trunk Assessment Cervical / Trunk Assessment: Kyphotic   Communication Communication Communication: No difficulties   Cognition Arousal/Alertness: Awake/alert Behavior During Therapy: WFL for tasks assessed/performed Overall Cognitive Status: Impaired/Different from baseline Area of Impairment: Attention;Memory;Following commands;Safety/judgement;Awareness;Problem solving;Orientation                 Orientation Level: Disoriented to;Time;Situation (year is "1999") Current Attention Level: Sustained Memory: Decreased short-term memory;Decreased recall of precautions Following Commands: Follows one step commands consistently;Follows one step commands with increased time;Follows multi-step commands inconsistently Safety/Judgement: Decreased awareness of safety;Decreased awareness of deficits Awareness: Intellectual Problem Solving: Slow processing;Difficulty sequencing;Requires verbal cues;Requires tactile cues General Comments: Pt with decreased orientation and stating the year is 1999 despite cues to re-orient. Pt with decreased ST memory, awareness, and following of commands. When following commnads to write on white board; pt requiring Max cues to locate white board. When asking pt "to say hi" on write board, pt wrote her name. Then providing direct commands to write hello and she did so.   General Comments  VSS on RA    Exercises     Shoulder Instructions      Home Living Family/patient expects to be discharged to:: Private residence Living Arrangements: Children (son) Available Help at Discharge: Family;Available 24 hours/day (son works from home) Type of Home: Apartment Home Access: [place]: Two level;Bed/bath upstairs;Able to live on main level with bedroom/bathroom     Bathroom  Shower/Tub: Teacher, early years/pre: Standard     Home Equipment: Environmental consultant - 2 wheels;Crutches;Bedside commode;Shower seat;Grab bars - toilet;Grab bars - tub/shower;Wheelchair - manual;Hospital bed   Additional Comments: Unsure how much is accurate, pt is poor historian. She states her son has been living with her, even before her significant other passed away.      Prior Functioning/Environment Level of Independence: Independent with assistive device(s)        Comments: One moment pt states her son helps her and then the next she says she is independent. Pt states she drives and uses crtuches to ambulate majority of time and sometimes a RW. Unsure of accuracy as pt is poor historian.        OT Problem List: Decreased strength;Decreased range of motion;Decreased activity tolerance;Impaired balance (sitting and/or standing);Decreased safety awareness;Decreased knowledge of use of DME or AE;Decreased knowledge of precautions      OT Treatment/Interventions: Self-care/ADL training;Therapeutic exercise;Energy conservation;DME and/or AE instruction;Therapeutic activities;Patient/family education    OT Goals(Current goals can be found in the care plan section) Acute Rehab OT Goals Patient Stated Goal: to walk OT Goal Formulation: With patient Time For Goal Achievement: 10/19/20 Potential to Achieve Goals: Good  OT Frequency: Min 2X/week   Barriers to D/C:            Co-evaluation              AM-PAC OT "6 Clicks" Daily Activity     Outcome Measure Help from another person eating meals?: A Little Help from another person taking care of personal grooming?: A Little Help from another person toileting, which includes using toliet, bedpan,  or urinal?: A Little Help from another person bathing (including washing, rinsing, drying)?: A Little Help from another person to put on and taking off regular upper body clothing?: A Little Help from another person to put on and  taking off regular lower body clothing?: A Little 6 Click Score: 18   End of Session Equipment Utilized During Treatment: Gait belt;Rolling walker Nurse Communication: Mobility status  Activity Tolerance: Patient tolerated treatment well Patient left: in chair;with call bell/phone within reach;with chair alarm set;with restraints reapplied  OT Visit Diagnosis: Unsteadiness on feet (R26.81);Other abnormalities of gait and mobility (R26.89);Muscle weakness (generalized) (M62.81)                Time: 5732-2567 OT Time Calculation (min): 26 min Charges:  OT General Charges $OT Visit: 1 Visit OT Evaluation $OT Eval Moderate Complexity: 1 Mod OT Treatments $Self Care/Home Management : 8-22 mins  Eimi Viney MSOT, OTR/L Acute Rehab Pager: [phone] Office: [place] 10/05/2020, 2:34 PM

## 2020-10-06 LAB — BASIC METABOLIC PANEL
Anion gap: 19 — ABNORMAL HIGH (ref 5–15)
BUN: 27 mg/dL — ABNORMAL HIGH (ref 8–23)
CO2: 16 mmol/L — ABNORMAL LOW (ref 22–32)
Calcium: 9.6 mg/dL (ref 8.9–10.3)
Chloride: 102 mmol/L (ref 98–111)
Creatinine, Ser: 1.04 mg/dL — ABNORMAL HIGH (ref 0.44–1.00)
GFR, Estimated: >60 mL/min (ref >60)
Glucose, Bld: 91 mg/dL (ref 70–99)
Potassium: 5 mmol/L (ref 3.5–5.1)
Sodium: 137 mmol/L (ref 135–145)

## 2020-10-06 LAB — AMMONIA: Ammonia: <9 umol/L — ABNORMAL LOW (ref 9–35)

## 2020-10-06 LAB — METHYLMALONIC ACID, SERUM: Methylmalonic Acid, Quantitative: 1928 nmol/L — ABNORMAL HIGH (ref 0–378)

## 2020-10-06 MED ORDER — GABAPENTIN 250 MG/5ML PO SOLN
100.0000 mg | Freq: Every day | ORAL | Status: DC
  Administered 2020-10-07 – 2020-10-10 (×4): 100 mg via ORAL
  Filled 2020-10-06 (×6): qty 2

## 2020-10-06 MED ORDER — SODIUM CHLORIDE 0.9 % IV SOLN: INTRAVENOUS | Status: DC

## 2020-10-06 NOTE — Plan of Care (Signed)
  Problem: Safety: Goal: Non-violent Restraint(s) Outcome: Progressing   Problem: Safety: Goal: Non-violent Restraint(s) Outcome: Progressing   

## 2020-10-06 NOTE — Progress Notes (Signed)
Inpatient Rehab Admissions:  Inpatient Rehab Consult received.  I met with patient at the bedside for rehabilitation assessment and to discuss goals and expectations of an inpatient rehab admission.  Pt acknowledged understanding.  Pt interested in pursuing CIR.  Pt gave permission to contact son, Rodman Key.  He also acknowledged understanding and supports pt pursuing CIR.  Will continue to follow.   Signed: Gayland Curry, [place], [place] Admissions Coordinator [phone]

## 2020-10-06 NOTE — Hospital Course (Signed)
56 white female Chronic hep C COPD chronic respiratory failure 2 L-chronic tobacco use Methadone habituation with prior drug addiction/opiates  Admitted at [facility] 4/19-3/79 toxic metabolic encephalopathy, aphasic-CT head MR   -LP-Rx acyclovir?  Aseptic/viral meningitis -Had upper extremity posturing therefore considered to have morning status epilepticus 3/27 transfer to [facility] 3/28 started on methadone clonidine developed hypotension 3/29 Precedex started because of agitation pressors discontinued 3/30 acyclovir discontinued and patient extubated core track placed Precedex continued-SLP could not be cued successfully 3/31 started dysphagia diet-patient pulled out feeding tube CVL-Precedex discontinued 4/2 transferred to [place]

## 2020-10-06 NOTE — Progress Notes (Signed)
PROGRESS NOTE   Natalie [name]  QZR:[id number] DOB: 02/20/1956 DOA: 09/30/2020 PCP: Henrietta Hoover, MD  Brief Narrative:   19 white female Chronic hep C COPD chronic respiratory failure 2 L-chronic tobacco use Methadone habituation with prior drug addiction/opiates  Admitted at [facility] 3/54-5/62 toxic metabolic encephalopathy, aphasic-CT head MR   -LP-Rx acyclovir?  Aseptic/viral meningitis -Had upper extremity posturing therefore considered to have morning status epilepticus 3/27 transfer to [facility] 3/28 started on methadone clonidine developed hypotension 3/29 Precedex started because of agitation pressors discontinued 3/30 acyclovir discontinued and patient extubated core track placed Precedex continued-SLP could not be cued successfully 3/31 started dysphagia diet-patient pulled out feeding tube CVL-Precedex discontinued 4/2 transferred to [place] based course  Toxic metabolic encephalopathy on admission without source Suspect either methadone withdrawal or overdose of the same Reinitiated at lower dose of 25 3 times daily D/c Keppra per Dr. Alferd Patee Per collateral from son Matt-patient has been on Methadone-has been on this for a "long time"--many years--sounds like she had an issue with Pain meds and was going to a methadone clinic for help--she was going to [facility] [[phone]--patient lost her BF 22 years ago, with the stress of losing him may have caused her to "forget" Other work-up inclusive of MRI LP EEG all negative She is redirectable and does not require further Precedex Completed 5 days B12 Chronic hep C Obtain viral load-unclear if she has ever been treated-suspect prior IVDU Rpt ammonia level Mild hyperkalemia AKI 2/2 ATN--CKDiiia Slightly rising creatinine/BUN Initiated NS 75 cc/H Downward dose adjust gabapentin to 100 at bedtime Chr smoker-prior chr resp fialure on 2L oxygen desat screen later in  admit Dysphagia/cognitive deficit?  SLP rec's dys 1 diet Will ask for non emergent cognitive linguistic eval   DVT prophylaxis: loveneox Code Status: FULL Family Communication: Son Tanylah, Schnoebelen [phone]  Disposition:  Status is: Inpatient  Remains inpatient appropriate because:Hemodynamically unstable, Ongoing active pain requiring inpatient pain management and Inpatient level of care appropriate due to severity of illness   Dispo: The patient is from: Home              Anticipated d/c is to: SNF              Patient currently is not medically stable to d/c.   Difficult to place patient No   Consultants:   None yet  Procedures: n  Antimicrobials: n    Subjective:  Tangential Answers most questions vaguely Nurse reports pulled out 1 IV today  Objective: Vitals:   10/06/20 0007 10/06/20 0431 10/06/20 0759 10/06/20 0822  BP: (!) 122/58 (!) 127/93  111/70  Pulse: 85 74    Resp: _0 Temp: 98.1 F (36.7 C) 97.6 F (36.4 C)  98.2 F (36.8 C)  TempSrc: Oral Oral  Oral  SpO2: 96% 100% 94%   Weight:        Intake/Output Summary (Last 24 hours) at 10/06/2020 1124 Last data filed at 10/06/2020 0558 Gross per 24 hour  Intake 360 ml  Output 450 ml  Net -90 ml   Filed Weights   10/01/20 0447 10/05/20 0500  Weight: 51.1 kg 47.7 kg    Examination:  Tangential, cannot keep on topic cta b no added sound abd soft  No LE edema  Data Reviewed: personally reviewed   CBC    Component Value Date/Time   WBC 7.2 10/04/2020 0606   RBC 3.98 10/04/2020 0606  HGB 12.2 10/04/2020 0606   HGB 11.4 (L) 05/09/2012 0748   HCT 37.5 10/04/2020 0606   HCT 34.3 (L) 05/09/2012 0748   PLT 182 10/04/2020 0606   PLT 235 05/09/2012 0748   MCV 94.2 10/04/2020 0606   MCV 98 05/09/2012 0748   MCH 30.7 10/04/2020 0606   MCHC 32.5 10/04/2020 0606   RDW 14.7 10/04/2020 0606   RDW 14.6 (H) 05/09/2012 0748   LYMPHSABS 0.7 (L) 07/24/2017 2140   LYMPHSABS 1.5 05/09/2012  0748   MONOABS 0.7 07/24/2017 2140   MONOABS 0.8 05/09/2012 0748   EOSABS 0.0 07/24/2017 2140   EOSABS 0.0 05/09/2012 0748   BASOSABS 0.0 07/24/2017 2140   BASOSABS 0.0 05/09/2012 0748   CMP Latest Ref Rng & Units 10/06/2020 10/04/2020 10/03/2020  Glucose 70 - 99 mg/dL 91 137(H) 130(H)  BUN 8 - 23 mg/dL 27(H) 22 19  Creatinine 0.44 - 1.00 mg/dL 1.04(H) 0.79 0.84  Sodium 135 - 145 mmol/L 137 138 137  Potassium 3.5 - 5.1 mmol/L 5.0 3.8 3.8  Chloride 98 - 111 mmol/L 102 101 105  CO2 22 - 32 mmol/L 16(L) 32 27  Calcium 8.9 - 10.3 mg/dL 9.6 9.5 9.3  Total Protein 6.5 - 8.1 g/dL - 5.9(L) 5.4(L)  Total Bilirubin 0.3 - 1.2 mg/dL - 0.5 0.6  Alkaline Phos 38 - 126 U/L - 56 63  AST 15 - 41 U/L - 34 36  ALT 0 - 44 U/L - 30 26     Radiology Studies: DG Swallowing Func-Speech Pathology  Result Date: 10/04/2020 Objective Swallowing Evaluation: Type of Study: MBS-Modified Barium Swallow Study  Patient Details Name: Natalie [name] MRN: [id number] Date of Birth: 1955-12-25 Today's Date: 10/04/2020 Time: SLP Start Time (ACUTE ONLY): 1340 -SLP Stop Time (ACUTE ONLY): 4818 SLP Time Calculation (min) (ACUTE ONLY): 15 min Past Medical History: Past Medical History: Diagnosis Date . Cataracts, bilateral  . Chickenpox cataracts . COPD (chronic obstructive pulmonary disease) ([place])  . Depression  . Drug addiction ([place])  . Headache  . Hepatitis C virus infection  . Hepatitis C without hepatic coma  . History of chicken pox  . History of colon polyps  . Seizures ([place])  Past Surgical History: Past Surgical History: Procedure Laterality Date . APPENDECTOMY   . CHOLECYSTECTOMY   . COLONOSCOPY WITH PROPOFOL N/A 04/30/2015  Procedure: COLONOSCOPY WITH PROPOFOL;  Surgeon: Josefine Class, MD;  Location: [facility] ENDOSCOPY;  Service: Endoscopy;  Laterality: N/A; . COLONOSCOPY WITH PROPOFOL N/A 07/13/2017  Procedure: COLONOSCOPY WITH PROPOFOL;  Surgeon: Manya Silvas, MD;  Location: [facility] ENDOSCOPY;  Service: Endoscopy;  Laterality:  N/A; . ESOPHAGOGASTRODUODENOSCOPY (EGD) WITH PROPOFOL N/A 07/13/2017  Procedure: ESOPHAGOGASTRODUODENOSCOPY (EGD) WITH PROPOFOL;  Surgeon: Manya Silvas, MD;  Location: [facility] ENDOSCOPY;  Service: Endoscopy;  Laterality: N/A; . EYE SURGERY   HPI: 65 year old female with COPD and CKD stage IIIa who was referred from [facility] for evaluation of altered mental status, CT and MRi negative for acute change, LP suggesting of infection, started empirically on acyclovir. Then concern for seizure. Pt intubated from 3/27-3/30.  No data recorded Assessment / Plan / Recommendation CHL IP CLINICAL IMPRESSIONS 10/04/2020 Clinical Impression Pt demonstrates a mild oral dysphagia with decreased ability to masticate (unsure if pt uses dentures at baseline, edentulous during assessment) and pocketing of unmasticated solids. Pt has instances of silent aspiration just before adequate laryngeal closure is achieved with thin liquids regardless of bolus size. Cued cough ineffective. Pt could not achieve compensatory positions/strategies due to  mentation. Nectar thick liquids are tolerated well and nectar and dys 2 diet is recommended. Will f/u. SLP Visit Diagnosis -- Attention and concentration deficit following -- Frontal lobe and executive function deficit following -- Impact on safety and function --   CHL IP TREATMENT RECOMMENDATION 10/03/2020 Treatment Recommendations Therapy as outlined in treatment plan below   Prognosis 10/04/2020 Prognosis for Safe Diet Advancement Good Barriers to Reach Goals -- Barriers/Prognosis Comment -- CHL IP DIET RECOMMENDATION 10/04/2020 SLP Diet Recommendations Dysphagia 2 (Fine chop) solids;Nectar thick liquid Liquid Administration via Cup;Straw Medication Administration Whole meds with liquid Compensations Slow rate;Small sips/bites Postural Changes Seated upright at 90 degrees   No flowsheet data found.  CHL IP FOLLOW UP RECOMMENDATIONS 10/04/2020 Follow up Recommendations Inpatient Rehab   CHL IP FREQUENCY  AND DURATION 10/04/2020 Speech Therapy Frequency (ACUTE ONLY) min 2x/week Treatment Duration 2 weeks      CHL IP ORAL PHASE 10/04/2020 Oral Phase Impaired Oral - Pudding Teaspoon -- Oral - Pudding Cup -- Oral - Honey Teaspoon -- Oral - Honey Cup -- Oral - Nectar Teaspoon -- Oral - Nectar Cup Lingual/palatal residue Oral - Nectar Straw Lingual/palatal residue Oral - Thin Teaspoon -- Oral - Thin Cup Lingual/palatal residue Oral - Thin Straw Lingual/palatal residue Oral - Puree Lingual/palatal residue Oral - Mech Soft Lingual/palatal residue;Decreased bolus cohesion;Impaired mastication Oral - Regular -- Oral - Multi-Consistency -- Oral - Pill -- Oral Phase - Comment --  CHL IP PHARYNGEAL PHASE 10/04/2020 Pharyngeal Phase Impaired Pharyngeal- Pudding Teaspoon -- Pharyngeal -- Pharyngeal- Pudding Cup -- Pharyngeal -- Pharyngeal- Honey Teaspoon -- Pharyngeal -- Pharyngeal- Honey Cup -- Pharyngeal -- Pharyngeal- Nectar Teaspoon -- Pharyngeal -- Pharyngeal- Nectar Cup WFL Pharyngeal -- Pharyngeal- Nectar Straw WFL Pharyngeal -- Pharyngeal- Thin Teaspoon -- Pharyngeal -- Pharyngeal- Thin Cup Penetration/Aspiration before swallow Pharyngeal Material enters airway, passes BELOW cords without attempt by patient to eject out (silent aspiration);Material does not enter airway Pharyngeal- Thin Straw Penetration/Aspiration before swallow;Trace aspiration Pharyngeal Material enters airway, passes BELOW cords without attempt by patient to eject out (silent aspiration) Pharyngeal- Puree WFL Pharyngeal -- Pharyngeal- Mechanical Soft -- Pharyngeal -- Pharyngeal- Regular -- Pharyngeal -- Pharyngeal- Multi-consistency -- Pharyngeal -- Pharyngeal- Pill -- Pharyngeal -- Pharyngeal Comment --  No flowsheet data found. Herbie [place], MA CCC-SLP Acute Rehabilitation Services Pager [phone] Office [phone] Lynann Beaver 10/04/2020, 2:31 PM                Scheduled Meds: . chlorhexidine gluconate (MEDLINE KIT)  15 mL Mouth  Rinse BID  . Chlorhexidine Gluconate Cloth  6 each Topical Q0600  . enoxaparin (LOVENOX) injection  40 mg Subcutaneous Q24H  . gabapentin  300 mg Oral Daily  . ipratropium-albuterol  3 mL Nebulization BID  . levETIRAcetam  750 mg Oral BID  . methadone  25 mg Oral Q8H  . pantoprazole sodium  40 mg Oral Q1200  . thiamine  100 mg Per Tube Daily   Or  . thiamine injection  100 mg Intravenous Daily   Continuous Infusions: . sodium chloride 75 mL/hr at 10/06/20 0850     LOS: 6 days   Time spent: 40  Nita Sells, MD [facility] To contact the attending provider between 7A-7P or the covering provider during after hours 7P-7A, please log into the web site [email or web address] and access using universal [place] password for that web site. If you do not have the password, please call the hospital operator.  10/06/2020, 11:24 AM

## 2020-10-07 LAB — CBC WITH DIFFERENTIAL/PLATELET
Abs Immature Granulocytes: 0.02 10*3/uL (ref 0.00–0.07)
Basophils Absolute: 0 10*3/uL (ref 0.0–0.1)
Basophils Relative: 0 %
Eosinophils Absolute: 0 10*3/uL (ref 0.0–0.5)
Eosinophils Relative: 0 %
HCT: 40.3 % (ref 36.0–46.0)
Hemoglobin: 12.9 g/dL (ref 12.0–15.0)
Immature Granulocytes: 0 %
Lymphocytes Relative: 20 %
Lymphs Abs: 1.2 10*3/uL (ref 0.7–4.0)
MCH: 30.9 pg (ref 26.0–34.0)
MCHC: 32 g/dL (ref 30.0–36.0)
MCV: 96.6 fL (ref 80.0–100.0)
Monocytes Absolute: 0.5 10*3/uL (ref 0.1–1.0)
Monocytes Relative: 10 %
Neutro Abs: 4 10*3/uL (ref 1.7–7.7)
Neutrophils Relative %: 70 %
Platelets: 271 10*3/uL (ref 150–400)
RBC: 4.17 MIL/uL (ref 3.87–5.11)
RDW: 15.4 % (ref 11.5–15.5)
WBC: 5.7 10*3/uL (ref 4.0–10.5)
nRBC: 0 % (ref 0.0–0.2)

## 2020-10-07 LAB — COMPREHENSIVE METABOLIC PANEL
ALT: 26 U/L (ref 0–44)
AST: 19 U/L (ref 15–41)
Albumin: 3.2 g/dL — ABNORMAL LOW (ref 3.5–5.0)
Alkaline Phosphatase: 61 U/L (ref 38–126)
Anion gap: 6 (ref 5–15)
BUN: 19 mg/dL (ref 8–23)
CO2: 31 mmol/L (ref 22–32)
Calcium: 9.3 mg/dL (ref 8.9–10.3)
Chloride: 101 mmol/L (ref 98–111)
Creatinine, Ser: 1.06 mg/dL — ABNORMAL HIGH (ref 0.44–1.00)
GFR, Estimated: 59 mL/min — ABNORMAL LOW (ref >60)
Glucose, Bld: 85 mg/dL (ref 70–99)
Potassium: 4 mmol/L (ref 3.5–5.1)
Sodium: 138 mmol/L (ref 135–145)
Total Bilirubin: 0.6 mg/dL (ref 0.3–1.2)
Total Protein: 6.2 g/dL — ABNORMAL LOW (ref 6.5–8.1)

## 2020-10-07 LAB — GLUCOSE, CAPILLARY
Glucose-Capillary: 115 mg/dL — ABNORMAL HIGH (ref 70–99)
Glucose-Capillary: 104 mg/dL — ABNORMAL HIGH (ref 70–99)

## 2020-10-07 NOTE — Progress Notes (Signed)
PROGRESS NOTE   Natalie [name]  GQQ:[id number] DOB: 1955-10-24 DOA: 09/30/2020 PCP: Henrietta Hoover, MD  Brief Narrative:   22 white female Chronic hep C COPD chronic respiratory failure 2 L-chronic tobacco use Methadone habituation with prior drug addiction/opiates  Admitted at [facility] 6/71-2/45 toxic metabolic encephalopathy, aphasic-CT head MR   -LP-Rx acyclovir?  Aseptic/viral meningitis -Had upper extremity posturing therefore considered to have morning status epilepticus 3/27 transfer to [facility] 3/28 started on methadone clonidine developed hypotension 3/29 Precedex started because of agitation pressors discontinued 3/30 acyclovir discontinued and patient extubated core track placed Precedex continued-SLP could not be cued successfully 3/31 started dysphagia diet-patient pulled out feeding tube CVL-Precedex discontinued 4/2 transferred to [place] based course  Toxic metabolic encephalopathy on admission without source Suspect either methadone withdrawal or overdose of the same Reinitiated at lower dose of 25 3 times daily D/c Keppra per Dr. Alferd Patee has been on Methadone many years-- methadone clinic for help--she was going to [place] [phone]--patient lost her BF 2 years ago, with the stress of losing him may have caused her to "forget" Await reports from [facility] [place]-secretary and nursing aware Other work-up inclusive of MRI LP EEG all negative She is redirectable but does need telemetry sitter Completed 5 days B12 Chronic hep C Await viral load from 4/2-ammonia level is negative Mild hyperkalemia AKI 2/2 ATN--CKDiiia Continue NS 75 cc/H for now Downward dose adjust gabapentin to 100 at bedtime Chr smoker-prior chr resp fialure on 2L oxygen desat screen later in admit Dysphagia/cognitive deficit?  SLP rec's dys 1 diet Await non emergent cognitive linguistic eval  DVT prophylaxis: loveneox Code Status:  FULL Family Communication: Son Vanetta, [place] [phone]  Disposition:  Status is: Inpatient  Remains inpatient appropriate because:Hemodynamically unstable, Ongoing active pain requiring inpatient pain management and Inpatient level of care appropriate due to severity of illness\  Dispo: The patient is from: Home              Anticipated d/c is to: SNF Long discussion at the bedside with both sons who are present Patient has stabilized clinically and I expect may be ready in the next several days to discharge however she will need to be without telemetry sitter and we will need to find may be a memory unit place her at In addition we will also need to consolidate her pain regimen plan as she is on chronic methadone              Patient currently is medically stable to d/c.   Difficult to place patient No   Consultants:   None yet  Procedures: n  Antimicrobials: n    Subjective:  Tangential She gets confused at some of my questions and seems somewhat stressed out   Objective: Vitals:   10/07/20 0838 10/07/20 0841 10/07/20 1212 10/07/20 1559  BP: 107/66  (!) 103/57 124/66  Pulse:  76 74 79  Resp: _0 Temp: 97.7 F (36.5 C)  97.8 F (36.6 C) 98.2 F (36.8 C)  TempSrc: Oral  Oral Oral  SpO2: 96% 98% 96% 97%  Weight:        Intake/Output Summary (Last 24 hours) at 10/07/2020 1648 Last data filed at 10/07/2020 0101 Gross per 24 hour  Intake 698.59 ml  Output --  Net 698.59 ml   Filed Weights   10/01/20 0447 10/05/20 0500  Weight: 51.1 kg 47.7 kg    Examination:  Still remains tangential  A little bit more agitated this afternoon when I saw her but redirectable Chest clear no rales rhonchi S1-S2 no murmur Quite asthenic and frail No lower extremity edema  Data Reviewed: personally reviewed   CBC    Component Value Date/Time   WBC 5.7 10/07/2020 0123   RBC 4.17 10/07/2020 0123   HGB 12.9 10/07/2020 0123   HGB 11.4 (L) 05/09/2012 0748   HCT  40.3 10/07/2020 0123   HCT 34.3 (L) 05/09/2012 0748   PLT 271 10/07/2020 0123   PLT 235 05/09/2012 0748   MCV 96.6 10/07/2020 0123   MCV 98 05/09/2012 0748   MCH 30.9 10/07/2020 0123   MCHC 32.0 10/07/2020 0123   RDW 15.4 10/07/2020 0123   RDW 14.6 (H) 05/09/2012 0748   LYMPHSABS 1.2 10/07/2020 0123   LYMPHSABS 1.5 05/09/2012 0748   MONOABS 0.5 10/07/2020 0123   MONOABS 0.8 05/09/2012 0748   EOSABS 0.0 10/07/2020 0123   EOSABS 0.0 05/09/2012 0748   BASOSABS 0.0 10/07/2020 0123   BASOSABS 0.0 05/09/2012 0748   CMP Latest Ref Rng & Units 10/07/2020 10/06/2020 10/04/2020  Glucose 70 - 99 mg/dL 85 91 137(H)  BUN 8 - 23 mg/dL 19 27(H) 22  Creatinine 0.44 - 1.00 mg/dL 1.06(H) 1.04(H) 0.79  Sodium 135 - 145 mmol/L 138 137 138  Potassium 3.5 - 5.1 mmol/L 4.0 5.0 3.8  Chloride 98 - 111 mmol/L 101 102 101  CO2 22 - 32 mmol/L 31 16(L) 32  Calcium 8.9 - 10.3 mg/dL 9.3 9.6 9.5  Total Protein 6.5 - 8.1 g/dL 6.2(L) - 5.9(L)  Total Bilirubin 0.3 - 1.2 mg/dL 0.6 - 0.5  Alkaline Phos 38 - 126 U/L 61 - 56  AST 15 - 41 U/L 19 - 34  ALT 0 - 44 U/L 26 - 30     Radiology Studies: No results found.   Scheduled Meds: . chlorhexidine gluconate (MEDLINE KIT)  15 mL Mouth Rinse BID  . gabapentin  100 mg Oral QHS  . ipratropium-albuterol  3 mL Nebulization BID  . methadone  25 mg Oral Q8H  . pantoprazole sodium  40 mg Oral Q1200  . thiamine  100 mg Per Tube Daily   Or  . thiamine injection  100 mg Intravenous Daily   Continuous Infusions: . sodium chloride 75 mL/hr at 10/07/20 1353     LOS: 7 days   Time spent: [place], MD [facility] To contact the attending provider between 7A-7P or the covering provider during after hours 7P-7A, please log into the web site [email or web address] and access using universal [place] password for that web site. If you do not have the password, please call the hospital operator.  10/07/2020, 4:48 PM

## 2020-10-07 NOTE — Plan of Care (Signed)
  Problem: Safety: Goal: Non-violent Restraint(s) Outcome: Progressing   Problem: Safety: Goal: Non-violent Restraint(s) Outcome: Progressing   

## 2020-10-07 NOTE — Plan of Care (Signed)
  Problem: Safety: Goal: Non-violent Restraint(s) 10/07/2020 1700 by Drucie Ip I, RN Outcome: Progressing 10/07/2020 1654 by Drucie Ip I, RN Outcome: Progressing   Problem: Safety: Goal: Non-violent Restraint(s) 10/07/2020 1700 by Drucie Ip I, RN Outcome: Progressing 10/07/2020 1654 by Drucie Ip I, RN Outcome: Progressing

## 2020-10-08 LAB — HCV RNA QUANT: HCV Quantitative: NOT DETECTED IU/mL (ref >50)

## 2020-10-08 LAB — GLUCOSE, CAPILLARY: Glucose-Capillary: 131 mg/dL — ABNORMAL HIGH (ref 70–99)

## 2020-10-08 MED ORDER — FOLIC ACID 1 MG PO TABS
1.0000 mg | ORAL_TABLET | Freq: Every day | ORAL | Status: DC
  Administered 2020-10-08 – 2020-10-11 (×4): 1 mg via ORAL
  Filled 2020-10-08 (×4): qty 1

## 2020-10-08 MED ORDER — VITAMIN B-12 1000 MCG PO TABS
1000.0000 ug | ORAL_TABLET | Freq: Every day | ORAL | Status: DC
  Administered 2020-10-08 – 2020-10-11 (×4): 1000 ug via ORAL
  Filled 2020-10-08 (×4): qty 1

## 2020-10-08 NOTE — Progress Notes (Signed)
Physical Therapy Treatment Patient Details Name: Natalie [name] MRN: [id number] DOB: 08/07/1955 Today's Date: 10/08/2020    History of Present Illness 65 y.o. female who presented to [name] as a transfer from [facility] in which she presented there 3/25 with AMS. Pt actively seizing and intubated 3/25. Extubated 10-26-22. Of note, pt's husband died 2020-09-17 and since then she has been intermittently falling. Urine drug screen was positive for methadone (chronic use) and benzos. CT brain shows chronic lacunar infract but no acute infarct or hemorrhage. EEG consistent with a toxic/metabolic encephalopathy pattern. Workup with MRI, LTM, CSF studies are all normal. PMH: seizures, depression, COPD, bil cataracts, and CKD stage IIIa.    PT Comments    Pt progressing towards physical therapy goals. She was very pleasant and motivated to work with PT this morning. Per discussion with CSW, it appears the current plan is for return home with son(s) to assist. Due to continued cognitive deficits including memory, feel this patient will need 24 hour supervision for safety. Noted pt with significant decreased safety awareness and awareness of current deficits during session today. CIR would be beneficial to decrease burden of care on son(s) and to maximize functional independence and safety at d/c. Will continue to follow.    Follow Up Recommendations  CIR;Supervision/Assistance - 24 hour     Equipment Recommendations  Other (comment) (TBD based on confirmation of current equipment owned)    Recommendations for Other Services Rehab consult     Precautions / Restrictions Precautions Precautions: Fall Precaution Comments: seizures Restrictions Weight Bearing Restrictions: No    Mobility  Bed Mobility Overal bed mobility: Needs Assistance Bed Mobility: Supine to Sit     Supine to sit: Min guard;HOB elevated     General bed mobility comments: Increased time and use of railings required  to transition to EOB.    Transfers Overall transfer level: Needs assistance Equipment used: Rolling walker (2 wheeled);None Transfers: Sit to/from Stand Sit to Stand: Min assist         General transfer comment: Light assist for steadying as pt powered up to full stand from EOB and then again from low toilet height.  Ambulation/Gait Ambulation/Gait assistance: Min assist Gait Distance (Feet): 200 Feet Assistive device: Rolling walker (2 wheeled) Gait Pattern/deviations: Step-through pattern;Decreased step length - right;Decreased step length - left;Decreased stride length;Narrow base of support Gait velocity: Decreased Gait velocity interpretation: <1.8 ft/sec, indicate of risk for recurrent falls General Gait Details: Slow and guarded with narrow BOS. Pt able to improve posture with cues but not maintaining.   Stairs             Wheelchair Mobility    Modified Rankin (Stroke Patients Only)       Balance Overall balance assessment: Needs assistance Sitting-balance support: No upper extremity supported;Feet supported Sitting balance-Leahy Scale: Fair Sitting balance - Comments: Static sitting EOB with min guard.   Standing balance support: Bilateral upper extremity supported;During functional activity Standing balance-Leahy Scale: Poor Standing balance comment: Reliant on 1-2 UE support for standing balance.                            Cognition Arousal/Alertness: Awake/alert Behavior During Therapy: WFL for tasks assessed/performed Overall Cognitive Status: Impaired/Different from baseline Area of Impairment: Attention;Memory;Following commands;Safety/judgement;Awareness;Problem solving                   Current Attention Level: Sustained Memory: Decreased short-term memory;Decreased  recall of precautions Following Commands: Follows one step commands consistently;Follows one step commands with increased time;Follows multi-step commands  inconsistently Safety/Judgement: Decreased awareness of safety;Decreased awareness of deficits Awareness: Intellectual Problem Solving: Slow processing;Difficulty sequencing;Requires verbal cues;Requires tactile cues        Exercises      General Comments        Pertinent Vitals/Pain Pain Assessment: No/denies pain Faces Pain Scale: No hurt    Home Living                      Prior Function            PT Goals (current goals can now be found in the care plan section) Acute Rehab PT Goals Patient Stated Goal: to walk PT Goal Formulation: With patient Time For Goal Achievement: 10/18/20 Potential to Achieve Goals: Good Progress towards PT goals: Progressing toward goals    Frequency    Min 3X/week      PT Plan Current plan remains appropriate    Co-evaluation              AM-PAC PT "6 Clicks" Mobility   Outcome Measure  Help needed turning from your back to your side while in a flat bed without using bedrails?: A Little Help needed moving from lying on your back to sitting on the side of a flat bed without using bedrails?: A Little Help needed moving to and from a bed to a chair (including a wheelchair)?: A Little Help needed standing up from a chair using your arms (e.g., wheelchair or bedside chair)?: A Little Help needed to walk in hospital room?: A Little Help needed climbing 3-5 steps with a railing? : A Lot 6 Click Score: 17    End of Session Equipment Utilized During Treatment: Gait belt Activity Tolerance: Patient tolerated treatment well Patient left: with call bell/phone within reach;with restraints reapplied;in bed;with bed alarm set Nurse Communication: Mobility status PT Visit Diagnosis: Unsteadiness on feet (R26.81);Other abnormalities of gait and mobility (R26.89);Muscle weakness (generalized) (M62.81);History of falling (Z91.81);Difficulty in walking, not elsewhere classified (R26.2)     Time: 4481-8563 PT Time Calculation  (min) (ACUTE ONLY): 26 min  Charges:  $Gait Training: 23-37 mins                     Rolinda Roan, PT, DPT Acute Rehabilitation Services Pager: [phone] Office: [place] 10/08/2020, 1:41 PM

## 2020-10-08 NOTE — Progress Notes (Signed)
Inpatient Rehab Admissions Coordinator:  Spoke with pt's son Rodman Key regarding if CIR is still desired for pt.  He told AC that he, his brother, and father are trying to make a determination of which venue (CIR, SNF) pt should pursue for ongoing therapy.  Awaiting return call with family's decision.  Will continue to follow.   Gayland Curry, [place], [place] Admissions Coordinator [phone]

## 2020-10-08 NOTE — Progress Notes (Addendum)
NEUROLOGY CONSULTATION PROGRESS NOTE   Date of service: October 08, 2020 Patient Name: Natalie [name] MRN:  [id number] DOB:  02-07-1956  Brief HPI  Natalie [name] is a 65 y.o. female with PMH significant for  has a past medical history of Cataracts, bilateral, Chickenpox (cataracts), COPD (chronic obstructive pulmonary disease) ([place]), Depression, Drug addiction ([place]), Headache, Hepatitis C virus infection, Hepatitis C without hepatic coma, History of chicken pox, History of colon polyps, and Seizures ([place]). who presents with altered mental status on day of admission. All of hx was obtained pt's son and sister via phone. The last time that the pt's son talked to the pt was on 09/26/20 and pt was at baseline and did not complain of anything. Of note, pt's significant other of 22 years passed away on 23-Aug-2020 and since that time pt has been intermittently falling. Pt has fallen within the last month hit her head but it unknown if pt had LOC or not. At that time, pt did not seek medical attention after falling. Also, pt has hx of drug use but family is unaware of the drugs that pt previously used.   Interval Hx  Neurology recalled for concerns for abnormal exam with past pointing and echolalia.  Patient seen and examined. When asked what brought her to the hospital she reports she does not know. She reports that she has been acting funny. When asked how long she had been "acting funny" she reports that she does not know. She reports that earlier today she had a period of vision trouble. She reports it only lasted a few minutes. When asked how long she has had these episodes she said she did not know. Her sisters report she has been a little forgetful but about a month ago her partner of 54 yrs died and then a few days before she was hospitalized her dog died. Denies any SI/HI  Vitals   Vitals:   10/08/20 0539 10/08/20 0843 10/08/20 0923 10/08/20 1123  BP:  139/78  138/85  Pulse:  69  73  Resp:  18  18  Temp:   97.9 F (36.6 C)  97.6 F (36.4 C)  TempSrc:  Oral  Axillary  SpO2:  97% 97% 95%  Weight: 52.5 kg        Body mass index is 20.5 kg/m.  Physical Exam   General: Laying comfortably in bed; in no acute distress.  HENT: Normal oropharynx and mucosa. Normal external appearance of ears and nose.  Neck: Supple, no pain or tenderness  CV: No JVD. No peripheral edema.  Pulmonary: Symmetric Chest rise. Normal respiratory effort.  Abdomen: Soft to touch, non-tender.  Ext: No cyanosis, edema, or deformity  Skin: No rash. Normal palpation of skin.   Musculoskeletal: Normal digits and nails by inspection. No clubbing.   Neurologic Examination  Mental status/Cognition: Alert, oriented only to self. Would often require a few promptings to get her to follow commands.  Speech/language: Fluent, comprehension intact, object naming intact, repetition intact. Cranial nerves:   CN II Pupils equal and reactive to light   CN III,IV,VI EOM intact, no gaze preference or deviation, no nystagmus    CN V normal sensation in V1, V2, and V3 segments bilaterally    CN VII no asymmetry, no nasolabial fold flattening    CN VIII normal hearing to speech    CN IX & X normal palatal elevation, no uvular deviation    CN XI 5/5 head turn and 5/5  shoulder shrug bilaterally    CN XII midline tongue protrusion    Motor: No vertical drift in UE bilaterally. LE also antigravity without drift b/l. Sensation:  Light touch Intact bilaterally   Pin prick    Temperature    Vibration   Proprioception    Coordination/Complex Motor:  - Finger to Nose intact bilaterally - Heel to shin intact bilaterally - Gait: deferred  Labs   Basic Metabolic Panel:  Lab Results  Component Value Date   NA 138 10/07/2020   K 4.0 10/07/2020   CO2 31 10/07/2020   GLUCOSE 85 10/07/2020   BUN 19 10/07/2020   CREATININE 1.06 (H) 10/07/2020   CALCIUM 9.3 10/07/2020   GFRNONAA 59 (L) 10/07/2020   GFRAA >60 07/25/2017   HbA1c:   Lab Results  Component Value Date   HGBA1C 5.9 (H) 09/28/2020   LDL: No results found for: [facility] Urine Drug Screen:     Component Value Date/Time   LABOPIA NONE DETECTED 09/28/2020 1555   COCAINSCRNUR NONE DETECTED 09/28/2020 1555   LABBENZ POSITIVE (A) 09/28/2020 1555   AMPHETMU NONE DETECTED 09/28/2020 1555   THCU NONE DETECTED 09/28/2020 1555   LABBARB NONE DETECTED 09/28/2020 1555    Alcohol Level     Component Value Date/Time   ETH <10 09/28/2020 1347   No results found for: PHENYTOIN, ZONISAMIDE, LAMOTRIGINE, LEVETIRACETA No results found for: PHENYTOIN, PHENOBARB, VALPROATE, CBMZ  Imaging and Diagnostic studies  Results for orders placed during the hospital encounter of 09/28/20  CT HEAD WO CONTRAST  Narrative CLINICAL DATA:  Seizure  EXAM: CT HEAD WITHOUT CONTRAST  TECHNIQUE: Contiguous axial images were obtained from the base of the skull through the vertex without intravenous contrast.  COMPARISON:  09/28/2020  FINDINGS: Brain: There is no acute intracranial hemorrhage, mass effect, or edema. Gray-white differentiation is preserved. There is no extra-axial fluid collection. Ventricles and sulci are stable in size and configuration. Patchy and confluent areas of hypoattenuation in the supratentorial with nonspecific but may reflect stable chronic microvascular ischemic changes greater than expected for age. 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  Vascular: No new findings.  Skull: Calvarium is unremarkable.  Sinuses/Orbits: No acute finding.  Other: None.  IMPRESSION: No acute intracranial abnormality or significant change since 09/28/2020.   Electronically Signed By: Macy Mis M.D. On: 09/30/2020 14:55  MMSE Orientation: 0 out of 10  Year- 2029   Season- fall   Month-Feb   Date- March 30 or 31st  Time- After lunch closer to dinner (it was 2:10 PM)    [place] know   [place] know     Hospital- Didn't know   Floor- Didn't know Registration: 3 out of 3 (apple,table,peny) Attention and Calculation: 0 out of 5 asked her to spell WORLD backwards twice and she spelled [id number] Recall: 2 out of 3 (apple and penny, not table) Language:7 out of 8  Name 2 objects- pen and glasses  Repeated-no if's and or buts Followed 3 step command- put her left thumb on her right ear   Patient read close your eyes and did so   When asked to write a sentence she wrote- your hour housees Copying: 0 out of 1 drew a hexagon and pentagon but correctly interlocked them  TOTAL SCORE: 12 out of 30  Impression   CANDAS DEEMER is a 65 y.o. female with PMH significant for has a past medical history of Cataracts, bilateral, Chickenpox (cataracts), COPD (  chronic obstructive pulmonary disease) ([place]), Depression, Drug addiction ([place]), Headache, Hepatitis C virus infection, Hepatitis C without hepatic coma, History of chicken pox, History of colon polyps, and Seizures ([place]).  Admitted for breakthrough seizure versus toxic metabolic encephalopathy. Her neurologic examination is notable for weakness which is generalized. No focal cranial nerve, motor or sensory deficits. Her cognition on the other hand is something that is not back at baseline. According to family, she was not confused prior to this hospitalization.  Her MMSE shows a score of 12/30, which is severe cognitive impairment. Unclear if this is something progressive or she has pseudodementia in the setting of depression due to recent personal losses.  This morning medicine team's exam was concerning for some past pointing on her finger-nose-finger testing and some echolalia as well, which unfortunately I was not able to personally confirm but it is not a bad idea to pursue an MRI of the brain to ensure that a stroke or other changes were not missed on the initial MRI that was done during this admission.  Impression: -Toxic metabolic  encephalopathy -Evaluate for stroke -Evaluate for reversible causes of cognitive impairment and memory loss.   Recommendations  B12 and Folate were found to be low 6 days ago will start supplementation Repeat MRI ordered Ordered RPR to screen for Syphilis   ______________________________________________________________________    Fatima Sanger MD Resident   Attending Neurohospitalist Addendum Patient seen and examined with APP/Resident. Agree with the history and physical as documented above. Agree with the plan as documented, which I helped formulate. I have independently reviewed the chart, obtained history, review of systems and examined the patient.I have personally reviewed pertinent head/neck/spine imaging (CT/MRI). Please feel free to call with any questions. Discussed plan with Dr. Verlon Au -- Amie [place], MD Neurologist [facility] Pager: [phone]

## 2020-10-08 NOTE — Progress Notes (Signed)
Occupational Therapy Treatment Patient Details Name: Natalie [name] MRN: [id number] DOB: 1955-07-29 Today's Date: 10/08/2020    History of present illness 65 y.o. female who presented [name] as a transfer from [facility] in which she presented there 3/25 with AMS. Pt actively seizing and intubated 3/25. Extubated 2022-10-19. Of note, pt's husband died 10-Sep-2020 and since then she has been intermittently falling. Urine drug screen was positive for methadone (chronic use) and benzos. CT brain shows chronic lacunar infract but no acute infarct or hemorrhage. EEG consistent with a toxic/metabolic encephalopathy pattern. Workup with MRI, LTM, CSF studies are all normal. PMH: seizures, depression, COPD, bil cataracts, and CKD stage IIIa.   OT comments  Patient supine in bed and agreeable to OT session.  Impulsively exiting bed, scooting down to footboard to exit as all rails were still up as therapist was setting up session.  Patient requires min assist for transfers and in room mobility using RW, min assist for toileting and min guard for grooming at sink.  She is able to follow 1 step commands but is easily distracted and requires simple commands only, poor awareness of safety, deficits and RW mgmt; increased distractions with visitors entering room.  Pt declined OOB in recliner, positioned bed in chair position for her to visit with her sisters.  Will follow acutely. CIR remains appropriate at this time.    Follow Up Recommendations  CIR;Supervision/Assistance - 24 hour    Equipment Recommendations  None recommended by OT    Recommendations for Other Services      Precautions / Restrictions Precautions Precautions: Fall Precaution Comments: seizures Restrictions Weight Bearing Restrictions: No       Mobility Bed Mobility Overal bed mobility: Needs Assistance Bed Mobility: Supine to Sit;Sit to Supine     Supine to sit: Supervision;HOB elevated Sit to supine: Supervision;HOB  elevated   General bed mobility comments: cueing for safety, supervision for safety    Transfers Overall transfer level: Needs assistance Equipment used: Rolling walker (2 wheeled);None Transfers: Sit to/from Stand Sit to Stand: Min assist         General transfer comment: min assist to steady from EOB and commode, cueing for hand placement and RW mgmt    Balance Overall balance assessment: Needs assistance Sitting-balance support: No upper extremity supported;Feet supported Sitting balance-Leahy Scale: Fair Sitting balance - Comments: Static sitting EOB with min guard.   Standing balance support: Bilateral upper extremity supported;During functional activity;No upper extremity supported Standing balance-Leahy Scale: Poor Standing balance comment: relies on UE support dynamically, able to engage in ADLs statically with min guard                           ADL either performed or assessed with clinical judgement   ADL Overall ADL's : Needs assistance/impaired     Grooming: Min guard;Wash/dry hands;Brushing hair;Standing;Wash/dry face Grooming Details (indicate cue type and reason): at sink                 Toilet Transfer: Minimal assistance;Ambulation;Regular Toilet;RW;Grab bars Toilet Transfer Details (indicate cue type and reason): cueing for safety Toileting- Clothing Manipulation and Hygiene: Minimal assistance;Sit to/from stand Toileting - Clothing Manipulation Details (indicate cue type and reason): clothing mgmt and safety     Functional mobility during ADLs: Minimal assistance;Rolling walker General ADL Comments: Pt presenting with decreased balance and cognition impacting her safe performance of ADLS     Vision  Perception     Praxis      Cognition Arousal/Alertness: Awake/alert Behavior During Therapy: Impulsive Overall Cognitive Status: Impaired/Different from baseline Area of Impairment: Attention;Memory;Following  commands;Safety/judgement;Awareness;Problem solving                   Current Attention Level: Sustained Memory: Decreased short-term memory;Decreased recall of precautions Following Commands: Follows one step commands consistently;Follows one step commands with increased time;Follows multi-step commands inconsistently Safety/Judgement: Decreased awareness of safety;Decreased awareness of deficits Awareness: Intellectual Problem Solving: Slow processing;Difficulty sequencing;Requires verbal cues;Requires tactile cues General Comments: Pt orientation not assessed, patient impulsively moving towards EOB before therapist returned to room (setting off alarms) and reports urgent need to use restroom.  Presenting with decreased safety awareness, attention, problem solving. Able to follow 1 step commands with increased time, but difficulty with mulitple step commands.        Exercises     Shoulder Instructions       General Comments poor RW mgmt and safety awareness, cueing to avoid obstacles and manage RW; pt distracted easily especially when her sisters arrived    Pertinent Vitals/ Pain       Pain Assessment: No/denies pain Faces Pain Scale: No hurt  Home Living                                          Prior Functioning/Environment              Frequency  Min 2X/week        Progress Toward Goals  OT Goals(current goals can now be found in the care plan section)  Progress towards OT goals: Progressing toward goals  Acute Rehab OT Goals Patient Stated Goal: to walk OT Goal Formulation: With patient  Plan Discharge plan remains appropriate;Frequency remains appropriate    Co-evaluation                 AM-PAC OT "6 Clicks" Daily Activity     Outcome Measure   Help from another person eating meals?: A Little Help from another person taking care of personal grooming?: A Little Help from another person toileting, which includes using  toliet, bedpan, or urinal?: A Little Help from another person bathing (including washing, rinsing, drying)?: A Little Help from another person to put on and taking off regular upper body clothing?: A Little Help from another person to put on and taking off regular lower body clothing?: A Little 6 Click Score: 18    End of Session Equipment Utilized During Treatment: Rolling walker  OT Visit Diagnosis: Unsteadiness on feet (R26.81);Other abnormalities of gait and mobility (R26.89);Muscle weakness (generalized) (M62.81)   Activity Tolerance Patient tolerated treatment well   Patient Left in bed;with call bell/phone within reach;with bed alarm set;with family/visitor present   Nurse Communication Mobility status;Precautions        Time: 8032-1224 OT Time Calculation (min): 10 min  Charges: OT General Charges $OT Visit: 1 Visit OT Treatments $Self Care/Home Management : 8-22 mins   Jolaine Artist, OT [place] Pager [phone] Office [place] 10/08/2020, 3:18 PM

## 2020-10-08 NOTE — Plan of Care (Signed)

## 2020-10-08 NOTE — NC FL2 (Signed)
[place] LEVEL OF CARE SCREENING TOOL     IDENTIFICATION  Patient Name: Natalie [name] Birthdate: May 30, 1956 Sex: female Admission Date (Current Location): 09/30/2020  [place] and [place] Number:  Engineering geologist and Address:  The . [facility], [place] [address], [place], [place] [address]      Provider Number: [id number]  Attending Physician Name and Address:  Nita Sells, MD  Relative Name and Phone Number:       Current Level of Care: Hospital Recommended Level of Care: [place] Prior Approval Number:    Date Approved/Denied:   PASRR Number: [id number]  Discharge Plan: SNF    Current Diagnoses: Patient Active Problem List   Diagnosis Date Noted  . Malnutrition of moderate degree 10/01/2020  . Pressure injury of skin 09/30/2020  . Encephalopathy acute 09/30/2020  . Altered mental status   . Respiratory failure ([place])   . Encephalopathy 09/28/2020  . Status epilepticus ([place])   . NSTEMI (non-ST elevated myocardial infarction) ([place]) 07/24/2017  . Cataracts, bilateral 03/30/2017  . COPD (chronic obstructive pulmonary disease) ([place]) 03/30/2017  . Depression 03/30/2017  . Drug addiction ([place]) 03/30/2017  . Seizures ([place]) 03/30/2017  . Chronic migraine without aura without status migrainosus, not intractable 07/26/2014  . 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 CKD (chronic kidney disease) stage 3, GFR 30-59 ml/min (HCC) 08/04/2013  . Edema 03/24/2011  . Tobacco use disorder 03/24/2011  . 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 Migraine headache 01/10/2011    Orientation RESPIRATION BLADDER Height & Weight     Self,Place  Normal Incontinent Weight: 115 lb 11.9 oz (52.5 kg) Height:     BEHAVIORAL SYMPTOMS/MOOD NEUROLOGICAL BOWEL NUTRITION STATUS      Continent Diet (see DC summary)  AMBULATORY STATUS COMMUNICATION OF NEEDS Skin   Limited Assist Verbally Normal                        Personal Care Assistance Level of Assistance  Bathing,Feeding,Dressing Bathing Assistance: Limited assistance Feeding assistance: Limited assistance Dressing Assistance: Limited assistance     Functional Limitations Info             SPECIAL CARE FACTORS FREQUENCY  PT (By licensed PT),OT (By licensed OT)     PT Frequency: 5x/wk OT Frequency: 5x/wk            Contractures Contractures Info: Not present    Additional Factors Info  Code Status,Allergies Code Status Info: Full Allergies Info: Sulfasalazine, Aspirin, Darvon (Propoxyphene), Iodine, Penicillins, Sulfa Antibiotics, Zithromax (Azithromycin)           Current Medications (10/08/2020):  This is the current hospital active medication list Current Facility-Administered Medications  Medication Dose Route Frequency Provider Last Rate Last Admin  . 0.9 %  sodium chloride infusion   Intravenous Continuous Nita Sells, MD 75 mL/hr at 10/08/20 0258 New Bag at 10/08/20 0258  . chlorhexidine gluconate (MEDLINE KIT) (PERIDEX) 0.12 % solution 15 mL  15 mL Mouth Rinse BID Erick Colace, NP   15 mL at 10/08/20 0913  . folic acid (FOLVITE) tablet 1 mg  1 mg Oral Daily Briant Cedar, MD   1 mg at 10/08/20 1521  . gabapentin (NEURONTIN) 250 MG/5ML solution 100 mg  100 mg Oral QHS Nita Sells, MD   100 mg at 10/07/20 2130  . ipratropium-albuterol (DUONEB) 0.5-2.5 (3) MG/3ML nebulizer solution 3 mL  3 mL Nebulization BID Kary Kos,  Ottis Stain, NP   3 mL at 10/08/20 0300  . methadone (DOLOPHINE) tablet 25 mg  25 mg Oral Q8H Erick Colace, NP   25 mg at 10/08/20 1521  . pantoprazole sodium (PROTONIX) 40 mg/20 mL oral suspension 40 mg  40 mg Oral Q1200 Erick Colace, NP   40 mg at 10/08/20 1323  . polyethylene glycol (MIRALAX / GLYCOLAX) packet 17 g  17 g Oral Daily PRN Erick Colace, NP   17 g at 10/08/20 0917  . Resource ThickenUp Clear   Oral PRN Erick Colace, NP   Given at 10/05/20 9233   . thiamine tablet 100 mg  100 mg Per Tube Daily Erick Colace, NP   100 mg at 10/08/20 0076   Or  . thiamine (B-1) injection 100 mg  100 mg Intravenous Daily Erick Colace, NP   100 mg at 10/05/20 1044  . vitamin B-12 (CYANOCOBALAMIN) tablet 1,000 mcg  1,000 mcg Oral Daily Briant Cedar, MD   1,000 mcg at 10/08/20 1521     Discharge Medications: Please see discharge summary for a list of discharge medications.  Relevant Imaging Results:  Relevant Lab Results:   Additional Information SS#: [id number]  Geralynn Ochs, LCSW

## 2020-10-08 NOTE — Progress Notes (Signed)
PROGRESS NOTE   Natalie [name]  JQB:[id number] DOB: 1955/12/03 DOA: 09/30/2020 PCP: Henrietta Hoover, MD  Brief Narrative:   57 white female Chronic hep C COPD chronic respiratory failure 2 L-chronic tobacco use Methadone habituation with prior drug addiction/opiates  Admitted at [facility] [place] 4/09-7/35 toxic metabolic encephalopathy, aphasic-CT head MR   -LP-Rx acyclovir?  Aseptic/viral meningitis -Had upper extremity posturing therefore considered to have morning status epilepticus 3/27 transfer to [facility] 3/28 started on methadone clonidine developed hypotension 3/29 Precedex started because of agitation pressors discontinued 3/30 acyclovir discontinued and patient extubated core track placed Precedex continued-SLP could not be cued successfully 3/31 started dysphagia diet-patient pulled out feeding tube CVL-Precedex discontinued 4/2 transferred to [place] based course  Toxic metabolic encephalopathy on admission without source Suspect either methadone withdrawal or overdose of the same DDx?  Neurosyphilis?  Pseudodementia D/c Keppra per discussion with Dr. Alferd Patee has been on Methadone many years-- methadone clinic for help--she was going to [facility] currently on 25 3 times daily methadone clinic since the 90s-has been taking opiates since she was 14 because of dental procedures which is how she became habituated to the same [place] notes obtained from [facility] office 4/4] --patient lost her BF recently and in addition to dog, with the stress of losing him may have caused her to "forget"--?  Pseudodementia on my discussion with Dr. Malen Gauze Other work-up inclusive of MRI LP EEG all negative Repeat MRI is ordered because she still has some past-pointing bilaterally and per Dr. Elnora Morrison discussion with me there was a concern for press syndrome when the patient was initially admitted Completed 5 days B12--- B12 level can be falsely  elevated-intermediary step methylmalonic acid ordered to see if this is true B12 deficiency Chronic hep C Await viral load from 4/2-ammonia level is negative Mild hyperkalemia AKI 2/2 ATN--CKDiiia Saline lock today NS 75 cc/H for now reevaluate with labs in a.m. Downward dose adjust gabapentin to 100 at bedtime Chr smoker-prior chr resp fialure on 2L oxygen Stable off oxygen Dysphagia/cognitive deficit?  SLP rec's dys 1 diet Await non emergent cognitive linguistic eval   DVT prophylaxis: loveneox Code Status: FULL Family Communication: Son updated at the bedside by me 4/3 [facility] Son [phone]  Disposition:  Status is: Inpatient  Remains inpatient appropriate because:Hemodynamically unstable, Ongoing active pain requiring inpatient pain management and Inpatient level of care appropriate due to severity of illness\  Dispo: The patient is from: Home               Anticipated d/c is to: Either skilled facility or CIR in the next several days              Patient currently is medically stable to d/c.   Difficult to place patient No   Consultants:   None yet  Procedures: n  Antimicrobials: n    Subjective:  Tangential When I examined and discussed with her she tells-2020 when I asked her the season and the month She can tell me that the president is Barbette Or but incorrectly names Clinton as a former president previous to that Less impulsive-easily redirectable   Objective: Vitals:   10/08/20 0843 10/08/20 0923 10/08/20 1123 10/08/20 1654  BP: 139/78  138/85 (!) 160/77  Pulse: 69  73 67  Resp: $Remo'18  18 18  'VMMNE$ Temp: 97.9 F (36.6 C)  97.6 F (36.4 C) 98 F (36.7 C)  TempSrc: Oral  Axillary Oral  SpO2: 97% 97% 95%  95%  Weight:        Intake/Output Summary (Last 24 hours) at 10/08/2020 1658 Last data filed at 10/08/2020 1300 Gross per 24 hour  Intake 360 ml  Output --  Net 360 ml   Filed Weights   10/01/20 0447 10/05/20 0500 10/08/20 0539  Weight: 51.1 kg 47.7  kg 52.5 kg    Examination:  Slightly tangential Chest clear no rales rhonchi S1-S2 no murmur Quite asthenic and frail No lower extremity edema Finger-nose-finger is dysmetric bilaterally Reflexes are brisk Sensory is grossly intact bilaterally Power is 5/5 gait was assessed earlier today she was walking to the restroom with a walker and no impaired cadence  Data Reviewed: personally reviewed   CBC    Component Value Date/Time   WBC 5.7 10/07/2020 0123   RBC 4.17 10/07/2020 0123   HGB 12.9 10/07/2020 0123   HGB 11.4 (L) 05/09/2012 0748   HCT 40.3 10/07/2020 0123   HCT 34.3 (L) 05/09/2012 0748   PLT 271 10/07/2020 0123   PLT 235 05/09/2012 0748   MCV 96.6 10/07/2020 0123   MCV 98 05/09/2012 0748   MCH 30.9 10/07/2020 0123   MCHC 32.0 10/07/2020 0123   RDW 15.4 10/07/2020 0123   RDW 14.6 (H) 05/09/2012 0748   LYMPHSABS 1.2 10/07/2020 0123   LYMPHSABS 1.5 05/09/2012 0748   MONOABS 0.5 10/07/2020 0123   MONOABS 0.8 05/09/2012 0748   EOSABS 0.0 10/07/2020 0123   EOSABS 0.0 05/09/2012 0748   BASOSABS 0.0 10/07/2020 0123   BASOSABS 0.0 05/09/2012 0748   CMP Latest Ref Rng & Units 10/07/2020 10/06/2020 10/04/2020  Glucose 70 - 99 mg/dL 85 91 137(H)  BUN 8 - 23 mg/dL 19 27(H) 22  Creatinine 0.44 - 1.00 mg/dL 1.06(H) 1.04(H) 0.79  Sodium 135 - 145 mmol/L 138 137 138  Potassium 3.5 - 5.1 mmol/L 4.0 5.0 3.8  Chloride 98 - 111 mmol/L 101 102 101  CO2 22 - 32 mmol/L 31 16(L) 32  Calcium 8.9 - 10.3 mg/dL 9.3 9.6 9.5  Total Protein 6.5 - 8.1 g/dL 6.2(L) - 5.9(L)  Total Bilirubin 0.3 - 1.2 mg/dL 0.6 - 0.5  Alkaline Phos 38 - 126 U/L 61 - 56  AST 15 - 41 U/L 19 - 34  ALT 0 - 44 U/L 26 - 30     Radiology Studies: No results found.   Scheduled Meds: . chlorhexidine gluconate (MEDLINE KIT)  15 mL Mouth Rinse BID  . folic acid  1 mg Oral Daily  . gabapentin  100 mg Oral QHS  . ipratropium-albuterol  3 mL Nebulization BID  . methadone  25 mg Oral Q8H  . pantoprazole sodium   40 mg Oral Q1200  . thiamine  100 mg Per Tube Daily   Or  . thiamine injection  100 mg Intravenous Daily  . vitamin B-12  1,000 mcg Oral Daily   Continuous Infusions: . sodium chloride 75 mL/hr at 10/08/20 0258     LOS: 8 days   Time spent: [place], MD [facility] To contact the attending provider between 7A-7P or the covering provider during after hours 7P-7A, please log into the web site [email or web address] and access using universal [place] password for that web site. If you do not have the password, please call the hospital operator.  10/08/2020, 4:58 PM

## 2020-10-09 ENCOUNTER — Inpatient Hospital Stay (HOSPITAL_COMMUNITY): Payer: BC Managed Care – PPO

## 2020-10-09 DIAGNOSIS — I639 Cerebral infarction, unspecified: Secondary | ICD-10-CM

## 2020-10-09 LAB — RENAL FUNCTION PANEL
Albumin: 3.2 g/dL — ABNORMAL LOW (ref 3.5–5.0)
Anion gap: 7 (ref 5–15)
BUN: 10 mg/dL (ref 8–23)
CO2: 30 mmol/L (ref 22–32)
Calcium: 9.6 mg/dL (ref 8.9–10.3)
Chloride: 102 mmol/L (ref 98–111)
Creatinine, Ser: 0.98 mg/dL (ref 0.44–1.00)
GFR, Estimated: >60 mL/min (ref >60)
Glucose, Bld: 102 mg/dL — ABNORMAL HIGH (ref 70–99)
Phosphorus: 4.5 mg/dL (ref 2.5–4.6)
Potassium: 3.7 mmol/L (ref 3.5–5.1)
Sodium: 139 mmol/L (ref 135–145)

## 2020-10-09 LAB — VITAMIN B1: Vitamin B1 (Thiamine): 107.2 nmol/L (ref 66.5–200.0)

## 2020-10-09 LAB — RPR: RPR Ser Ql: NONREACTIVE

## 2020-10-09 MED ORDER — ONDANSETRON HCL 4 MG/2ML IJ SOLN
4.0000 mg | Freq: Once | INTRAMUSCULAR | Status: AC
  Administered 2020-10-10: 4 mg via INTRAVENOUS
  Filled 2020-10-09: qty 2

## 2020-10-09 MED ORDER — CLOPIDOGREL BISULFATE 75 MG PO TABS
75.0000 mg | ORAL_TABLET | Freq: Every day | ORAL | Status: DC
  Administered 2020-10-09 – 2020-10-11 (×3): 75 mg via ORAL
  Filled 2020-10-09 (×3): qty 1

## 2020-10-09 MED ORDER — GADOBUTROL 1 MMOL/ML IV SOLN
5.0000 mL | Freq: Once | INTRAVENOUS | Status: AC | PRN
  Administered 2020-10-09: 5 mL via INTRAVENOUS

## 2020-10-09 MED ORDER — LORAZEPAM 2 MG/ML IJ SOLN
0.2500 mg | Freq: Once | INTRAMUSCULAR | Status: AC
  Administered 2020-10-10: 0.25 mg via INTRAVENOUS
  Filled 2020-10-09: qty 1

## 2020-10-09 MED ORDER — STROKE: EARLY STAGES OF RECOVERY BOOK
Freq: Once | Status: AC
  Filled 2020-10-09: qty 1

## 2020-10-09 NOTE — Progress Notes (Signed)
SLP Cancellation Note  Patient Details Name: Natalie [name] MRN: [id number] DOB: 29-Apr-1956   Cancelled treatment:       Reason Eval/Treat Not Completed: Other (comment). Received new orders for cognitive eval from Dr. Verlon Au. SLP is already treating this pts cognition and goals and treatment plan are in place. Unable to schedule session with pt today, but will continue to treat pt 2x a week for cognition and swallowing.    Yassin Scales, Katherene Ponto 10/09/2020, 8:54 AM

## 2020-10-09 NOTE — Progress Notes (Signed)
Occupational Therapy Treatment Patient Details Name: Natalie [name] MRN: [id number] DOB: 1955-09-06 Today's Date: 10/09/2020    History of present illness 65 y.o. female who presented to [name] as a [facility] from [facility] in which she presented there 3/25 with AMS. Pt actively seizing and intubated 3/25. Extubated 10/19/22. Of note, pt's husband died 09/10/2020 and since then she has been intermittently falling. Urine drug screen was positive for methadone (chronic use) and benzos. CT brain shows chronic lacunar infract but no acute infarct or hemorrhage. EEG consistent with a toxic/metabolic encephalopathy pattern. Workup with MRI, LTM, CSF studies are all normal. PMH: seizures, depression, COPD, bil cataracts, and CKD stage IIIa.   OT comments  Patient continues to make steady progress towards goals in skilled OT session. Patient's session encompassed functional mobility, self care tasks, and re-assessment of cognition. Pt discovered by therapy attempting to get OOB y herself, therefore therapist providing assistance. Pt requiring one person HHA to bathroom due to impulsivity but would benefit from RW for longer distances. Pt remains disoriented, requiring cues for impulsivity, to use soap to wash hands, and requesting therapist "turn her tv's on" when referring to the external monitor system and call bell system in room. Of note, pt unable to turn on her actual tv or work bed controls in session, with therapist providing assistance. Pt was able to identify which button to press if she needs help, with further reinforcement of pt safety. Pt would continue to greatly benefit from CIR in order to address deficits in higher level cognitive tasks and functional mobility. Therapy will continue to follow while in house.    Follow Up Recommendations  CIR;Supervision/Assistance - 24 hour    Equipment Recommendations  None recommended by OT    Recommendations for Other Services      Precautions  / Restrictions Precautions Precautions: Fall Precaution Comments: seizures Restrictions Weight Bearing Restrictions: No       Mobility Bed Mobility Overal bed mobility: Needs Assistance Bed Mobility: Supine to Sit;Sit to Supine     Supine to sit: Supervision Sit to supine: Supervision   General bed mobility comments: cueing for safety, supervision for safety    Transfers Overall transfer level: Needs assistance Equipment used: 1 person hand held assist Transfers: Sit to/from Stand Sit to Stand: Min assist         General transfer comment: min assist to steady from EOB due to impulsivity using one person HHA but recommending RW for longer distances    Balance Overall balance assessment: Needs assistance Sitting-balance support: No upper extremity supported;Feet supported Sitting balance-Leahy Scale: Fair     Standing balance support: Bilateral upper extremity supported;During functional activity;No upper extremity supported Standing balance-Leahy Scale: Poor Standing balance comment: relies on UE support dynamically, able to engage in ADLs statically with min guard Single Leg Stance - Right Leg: 2 (seconds) Single Leg Stance - Left Leg: 2 (seconds)                       ADL either performed or assessed with clinical judgement   ADL Overall ADL's : Needs assistance/impaired Eating/Feeding: Supervision/ safety;Set up;Sitting   Grooming: Min guard;Wash/dry hands;Standing;Wash/dry face Grooming Details (indicate cue type and reason): at sink, cues to use soap                 Toilet Transfer: Minimal assistance;Ambulation;Regular Toilet;Grab bars Toilet Transfer Details (indicate cue type and reason): cueing for safey, pt attempting to get  OOB independently therefore OTA providing assistance, pt unsteady on feet requiring one person HHA Toileting- Clothing Manipulation and Hygiene: Minimal assistance;Sit to/from stand Toileting - Clothing Manipulation  Details (indicate cue type and reason): clothing mgmt and safety     Functional mobility during ADLs: Minimal assistance;Rolling walker;Cueing for safety;Cueing for sequencing General ADL Comments: Pt recieved getting OOB to go to the bathroom, provided one person HHA, significant confusion and impulsivity noted     Vision       Perception     Praxis      Cognition Arousal/Alertness: Awake/alert Behavior During Therapy: Impulsive;Restless Overall Cognitive Status: Impaired/Different from baseline Area of Impairment: Attention;Memory;Following commands;Safety/judgement;Awareness;Problem solving;Orientation                 Orientation Level: Disoriented to;Time;Situation Current Attention Level: Sustained Memory: Decreased short-term memory;Decreased recall of precautions Following Commands: Follows one step commands consistently;Follows one step commands with increased time;Follows multi-step commands inconsistently Safety/Judgement: Decreased awareness of safety;Decreased awareness of deficits Awareness: Intellectual Problem Solving: Slow processing;Difficulty sequencing;Requires verbal cues;Requires tactile cues General Comments: Asking therapist to "turn on her TVs" pointing to remote monitor and call bell monitor on wall        Exercises     Shoulder Instructions       General Comments Stood at desk to perform single limb stance tasks and training; one significant loss of balance posteriorly when orking on R single limb stance, requiring heavy mod assist to prevent fall    Pertinent Vitals/ Pain       Pain Assessment: No/denies pain Faces Pain Scale: No hurt Pain Intervention(s): Limited activity within patient's tolerance;Monitored during session;Repositioned  Home Living                                          Prior Functioning/Environment              Frequency  Min 2X/week        Progress Toward Goals  OT Goals(current  goals can now be found in the care plan section)  Progress towards OT goals: Progressing toward goals  Acute Rehab OT Goals Patient Stated Goal: to walk OT Goal Formulation: With patient Time For Goal Achievement: 10/19/20 Potential to Achieve Goals: Good  Plan Discharge plan remains appropriate;Frequency remains appropriate    Co-evaluation                 AM-PAC OT "6 Clicks" Daily Activity     Outcome Measure   Help from another person eating meals?: A Little Help from another person taking care of personal grooming?: A Little Help from another person toileting, which includes using toliet, bedpan, or urinal?: A Little Help from another person bathing (including washing, rinsing, drying)?: A Little Help from another person to put on and taking off regular upper body clothing?: A Little Help from another person to put on and taking off regular lower body clothing?: A Little 6 Click Score: 18    End of Session    OT Visit Diagnosis: Unsteadiness on feet (R26.81);Other abnormalities of gait and mobility (R26.89);Muscle weakness (generalized) (M62.81)   Activity Tolerance Patient tolerated treatment well   Patient Left in bed;with call bell/phone within reach;with bed alarm set   Nurse Communication Mobility status;Precautions (therapist providing assist to bathroom due to pt exiting independently)        Time: 8416-6063 OT Time Calculation (min):  12 min  Charges: OT General Charges $OT Visit: 1 Visit OT Treatments $Self Care/Home Management : 8-22 mins  Corinne Ports E. Teryl Gubler, COTA/L Acute Rehabilitation Services [place] 10/09/2020, 2:37 PM

## 2020-10-09 NOTE — Plan of Care (Signed)

## 2020-10-09 NOTE — Progress Notes (Signed)
Physical Therapy Treatment Patient Details Name: Natalie [name] MRN: [id number] DOB: Sep 17, 1955 Today's Date: 10/09/2020    History of Present Illness 65 y.o. female who presented to [name] 3/27 as a transfer from [name] in which she presented there 3/25 with AMS. Pt actively seizing and intubated 3/25. Extubated 10-21-22. Of note, pt's husband died 09/12/2020 and since then she has been intermittently falling. Urine drug screen was positive for methadone (chronic use) and benzos. CT brain shows chronic lacunar infract but no acute infarct or hemorrhage. EEG consistent with a toxic/metabolic encephalopathy pattern. Workup with MRI, LTM, CSF studies are all normal. PMH: seizures, depression, COPD, bil cataracts, and CKD stage IIIa.    PT Comments    Continuing work on functional mobility and activity tolerance;  Session focused on gait and balance dysfunction; Erratic step width during gait, and had a major loss of balance while doing balance exercises focused on single limb stance; very pleasant and participating   Follow Up Recommendations  CIR;Supervision/Assistance - 24 hour     Equipment Recommendations  Other (comment) (TBD based on confirmation of current equipment owned)    Recommendations for Other Services Rehab consult     Precautions / Restrictions Precautions Precautions: Fall Precaution Comments: seizures    Mobility  Bed Mobility Overal bed mobility: Needs Assistance Bed Mobility: Supine to Sit;Sit to Supine     Supine to sit: Supervision Sit to supine: Supervision   General bed mobility comments: cueing for safety, supervision for safety    Transfers Overall transfer level: Needs assistance Equipment used: Rolling walker (2 wheeled);None Transfers: Sit to/from Stand Sit to Stand: Min assist         General transfer comment: min assist to steady from EOB  cueing for hand placement and RW mgmt  Ambulation/Gait Ambulation/Gait assistance: Min  assist;Mod assist Gait Distance (Feet): 200 Feet Assistive device: Rolling walker (2 wheeled);1 person hand held assist Gait Pattern/deviations: Step-through pattern;Decreased step length - right;Decreased step length - left;Decreased stride length;Narrow base of support Gait velocity: Decreased   General Gait Details: Slow and guarded with narrow BOS at times; erratic step width; Pt able to improve posture with cues but not maintaining; tends to reach out for UE support when walking without RW   Stairs             Wheelchair Mobility    Modified Rankin (Stroke Patients Only)       Balance     Sitting balance-Leahy Scale: Fair       Standing balance-Leahy Scale: Poor   Single Leg Stance - Right Leg: 2 (seconds) Single Leg Stance - Left Leg: 2 (seconds)                        Cognition Arousal/Alertness: Awake/alert Behavior During Therapy: Impulsive Overall Cognitive Status: Impaired/Different from baseline Area of Impairment: Attention;Memory;Following commands;Safety/judgement;Awareness;Problem solving                 Orientation Level: Disoriented to;Time;Situation Current Attention Level: Sustained Memory: Decreased short-term memory;Decreased recall of precautions Following Commands: Follows one step commands consistently;Follows one step commands with increased time;Follows multi-step commands inconsistently Safety/Judgement: Decreased awareness of safety;Decreased awareness of deficits Awareness: Intellectual Problem Solving: Slow processing;Difficulty sequencing;Requires verbal cues;Requires tactile cues General Comments: Pleasant and participating      Exercises      General Comments General comments (skin integrity, edema, etc.): Stood at desk to perform single limb stance tasks and training; one  significant loss of balance posteriorly when orking on R single limb stance, requiring heavy mod assist to prevent fall      Pertinent  Vitals/Pain Pain Assessment: No/denies pain Faces Pain Scale: No hurt Pain Intervention(s): Monitored during session    Home Living                      Prior Function            PT Goals (current goals can now be found in the care plan section) Acute Rehab PT Goals Patient Stated Goal: to walk PT Goal Formulation: With patient Time For Goal Achievement: 10/18/20 Potential to Achieve Goals: Good Progress towards PT goals: Progressing toward goals    Frequency    Min 3X/week      PT Plan Current plan remains appropriate    Co-evaluation              AM-PAC PT "6 Clicks" Mobility   Outcome Measure  Help needed turning from your back to your side while in a flat bed without using bedrails?: A Little Help needed moving from lying on your back to sitting on the side of a flat bed without using bedrails?: A Little Help needed moving to and from a bed to a chair (including a wheelchair)?: A Little Help needed standing up from a chair using your arms (e.g., wheelchair or bedside chair)?: A Little Help needed to walk in hospital room?: A Little Help needed climbing 3-5 steps with a railing? : A Lot 6 Click Score: 17    End of Session Equipment Utilized During Treatment: Gait belt Activity Tolerance: Patient tolerated treatment well Patient left: with call bell/phone within reach;with restraints reapplied;in bed;with bed alarm set Nurse Communication: Mobility status PT Visit Diagnosis: Unsteadiness on feet (R26.81);Other abnormalities of gait and mobility (R26.89);Muscle weakness (generalized) (M62.81);History of falling (Z91.81);Difficulty in walking, not elsewhere classified (R26.2)     Time: 1300-1310 PT Time Calculation (min) (ACUTE ONLY): 10 min  Charges:                        Natalie [name], PT  Acute Rehabilitation Services Pager [phone] Office [phone]    Natalie [name] 10/09/2020, 2:07 PM

## 2020-10-09 NOTE — Plan of Care (Signed)
  Problem: Education: Goal: Expressions of having a comfortable level of knowledge regarding the disease process will increase Outcome: Progressing   Problem: Coping: Goal: Ability to adjust to condition or change in health will improve Outcome: Progressing Goal: Ability to identify appropriate support needs will improve Outcome: Progressing   Problem: Health Behavior/Discharge Planning: Goal: Compliance with prescribed medication regimen will improve Outcome: Progressing   Problem: Medication: Goal: Risk for medication side effects will decrease Outcome: Progressing   Problem: Clinical Measurements: Goal: Complications related to the disease process, condition or treatment will be avoided or minimized Outcome: Progressing Goal: Diagnostic test results will improve Outcome: Progressing   Problem: Safety: Goal: Verbalization of understanding the information provided will improve Outcome: Progressing   Problem: Self-Concept: Goal: Level of anxiety will decrease Outcome: Progressing Goal: Ability to verbalize feelings about condition will improve Outcome: Progressing   Problem: Education: Goal: Knowledge of disease or condition will improve Outcome: Progressing Goal: Knowledge of secondary prevention will improve Outcome: Progressing Goal: Knowledge of patient specific risk factors addressed and post discharge goals established will improve Outcome: Progressing Goal: Individualized Educational Video(s) Outcome: Progressing   Problem: Coping: Goal: Will verbalize positive feelings about self Outcome: Progressing   Problem: Health Behavior/Discharge Planning: Goal: Ability to manage health-related needs will improve Outcome: Progressing   Problem: Self-Care: Goal: Ability to participate in self-care as condition permits will improve Outcome: Progressing

## 2020-10-09 NOTE — Progress Notes (Addendum)
NEUROLOGY CONSULTATION PROGRESS NOTE   Date of service: October 09, 2020 Patient Name: Natalie [name] MRN:  [id number] DOB:  [birth year]  Brief HPI  Natalie [name] is a 65 y.o. female with PMH significant for Cataracts, bilateral, Chickenpox (cataracts), COPD (chronic obstructive pulmonary disease) ([place]), Depression, Drug addiction ([place]), Headache, Hepatitis C virus infection, Hepatitis C without hepatic coma, History of chicken pox, History of colon polyps, and Seizures ([place]) who presents with altered mental status on day of admission. All of hx was obtained pt's son and sister via phone. The last time that the pt's son talked to the pt was on 09/26/20 and pt was at baseline and did not complain of anything. Of note, pt's significant other of 22 years passed away on Sep 09, 2020 and since that time pt has been intermittently falling. Pt has fallen within the last month hit her head but it unknown if pt had LOC or not. At that time, pt did not seek medical attention after falling. Also, pt has hx of drug use but family is unaware of the drugs that pt previously used.   Interval Hx   Neurology consulted yesterday for abnormal exam findings of past pointing and echolalia. On exam these were not present but she did have significant impairment on MMSE. Ordered repeat MRI for possible stroke. 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 Discussed the findings of a stroke with her and the concerns about her memory.   Vitals   Vitals:   10/09/20 0040 10/09/20 0348 10/09/20 0859 10/09/20 1142  BP: (!) 176/72 (!) 173/82 140/90 (!) 151/73  Pulse: 69 75 71 79  Resp: 16 19 18 18   Temp: 97.9 F (36.6 C) 97.8 F (36.6 C) 98 F (36.7 C) 98 F (36.7 C)  TempSrc: Oral Oral Oral Oral  SpO2: 93% 96% 94% 96%  Weight:  52 kg       Body mass index is 20.31 kg/m.  Physical Exam   General: Laying comfortably in bed; in no acute distress.  HENT: Normal oropharynx and mucosa. Normal external appearance of ears and nose.   Neck: Supple, no pain or tenderness  CV: No JVD. No peripheral edema.  Pulmonary: Symmetric Chest rise. Normal respiratory effort.  Abdomen: Soft to touch, non-tender.  Ext: No cyanosis, edema, or deformity  Skin: No rash. Normal palpation of skin.   Musculoskeletal: Normal digits and nails by inspection. No clubbing.   Neurologic Examination  Mental status/Cognition: Alert, oriented to self, and year. Still has significant confusion.  Speech/language: Fluent, comprehension intact, object naming intact, repetition intact. Cranial nerves:   CN II Pupils equal and reactive to light   CN III,IV,VI EOM intact, no gaze preference or deviation, no nystagmus    CN V normal sensation in V1, V2, and V3 segments bilaterally    CN VII no asymmetry, no nasolabial fold flattening    CN VIII normal hearing to speech    CN IX & X normal palatal elevation, no uvular deviation    CN XI 5/5 head turn and 5/5 shoulder shrug bilaterally    CN XII midline tongue protrusion    Motor: No vertical drift in UE bilaterally. LE also antigravity without drift b/l. Sensation:  Light touch Intact bilaterally   Pin prick    Temperature    Vibration   Proprioception    Coordination/Complex Motor:  - Finger to Nose intact bilaterally - Heel to shin intact bilaterally - Gait: deferred   Labs  Basic Metabolic Panel:  Lab Results  Component Value Date   NA 139 10/09/2020   K 3.7 10/09/2020   CO2 30 10/09/2020   GLUCOSE 102 (H) 10/09/2020   BUN 10 10/09/2020   CREATININE 0.98 10/09/2020   CALCIUM 9.6 10/09/2020   GFRNONAA >60 10/09/2020   GFRAA >60 07/25/2017   HbA1c:  Lab Results  Component Value Date   HGBA1C 5.9 (H) 09/28/2020   LDL: No results found for: [facility] Urine Drug Screen:     Component Value Date/Time   LABOPIA NONE DETECTED 09/28/2020 1555   COCAINSCRNUR NONE DETECTED 09/28/2020 1555   LABBENZ POSITIVE (A) 09/28/2020 1555   AMPHETMU NONE DETECTED 09/28/2020 1555    THCU NONE DETECTED 09/28/2020 1555   LABBARB NONE DETECTED 09/28/2020 1555    Alcohol Level     Component Value Date/Time   ETH <10 09/28/2020 1347   No results found for: PHENYTOIN, ZONISAMIDE, LAMOTRIGINE, LEVETIRACETA No results found for: PHENYTOIN, PHENOBARB, VALPROATE, CBMZ  Imaging and Diagnostic studies  Results for orders placed during the hospital encounter of 09/28/20  CT HEAD WO CONTRAST  Narrative CLINICAL DATA:  Seizure  EXAM: CT HEAD WITHOUT CONTRAST  TECHNIQUE: Contiguous axial images were obtained from the base of the skull through the vertex without intravenous contrast.  COMPARISON:  09/28/2020  FINDINGS: Brain: There is no acute intracranial hemorrhage, mass effect, or edema. Gray-white differentiation is preserved. There is no extra-axial fluid collection. Ventricles and sulci are stable in size and configuration. Patchy and confluent areas of hypoattenuation in the supratentorial with nonspecific but may reflect stable chronic microvascular ischemic changes greater than expected for age. 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  Vascular: No new findings.  Skull: Calvarium is unremarkable.  Sinuses/Orbits: No acute finding.  Other: None.  IMPRESSION: No acute intracranial abnormality or significant change since 09/28/2020.   Electronically Signed By: Macy Mis M.D. On: 09/30/2020 14:55   MR Brain W WO Contrast- Acute 6 cm infarct in the anterior left MCA territory is new from the MRI 09/29/2020. Multifocal mild petechial hemorrhage. No malignant hemorrhagic transformation or no mass effect. Elsewhere stable MRI appearance of the brain since last month. Chronically advanced but nonspecific cerebral white matter disease. Chronic lacunar infarcts in the deep gray matter nuclei.   MMSE 4/4 Orientation: 0 out of 10  Year- 2029   Season- fall   Month-Feb   Date- March 30 or 31st  Time- After lunch closer to dinner (it  was 2:10 PM)    [place] know   [place] know    Hospital- Didn't know   Floor- Didn't know Registration: 3 out of 3 (apple,table,peny) Attention and Calculation: 0 out of 5 asked her to spell WORLD backwards twice and she spelled [id number] Recall: 2 out of 3 (apple and penny, not table) Language:7 out of 8  Name 2 objects- pen and glasses  Repeated-no if's and or buts Followed 3 step command- put her left thumb on her right ear   Patient read close your eyes and did so   When asked to write a sentence she wrote- your hour housees Copying: 0 out of 1 drew a hexagon and pentagon but correctly interlocked them  TOTAL SCORE: 12 out of 14  Impression   65 year old with past history of COPD, depression, polysubstance abuse, hepatitis C, seizures, admitted for breakthrough seizure versus toxic metabolic encephalopathy and upon extubation continue to appear confused to family. Does show diminished cognitive  capacity in terms of her MMSE but also has word finding difficulty. MRI brain was completed yesterday-shows a moderate size anterior division of the left MCA territory infarct. This infarct is new from the MRI scan that was done on her admission on 09/29/2020. At the time of admission-she was seen at [facility] only after she was intubated and was on sedation. Unclear last known well All flow-voids appear open on the MRI-will obtain CTA head and neck for further evaluation. We will need full stroke work-up.  Recommendations  Telemetry Frequent neurochecks A1c Lipid panel CTA head and neck 2D echo PT OT speech therapy Continue B12 and folate replacement Allergic to aspirin-ordered Plavix.  Stroke team will follow with you.   ______________________________________________________________________   Fatima Sanger MD Resident  Attending Neurohospitalist Addendum Patient seen and examined with APP/Resident. Agree with the history and physical as  documented above. Agree with the plan as documented, which I helped formulate. I have independently reviewed the chart, obtained history, review of systems and examined the patient.I have personally reviewed pertinent head/neck/spine imaging (CT/MRI). Please feel free to call with any questions. --- Amie Portland, MD [facility] Pager: [phone]  If 7pm to 7am, please call on call as listed on AMION.

## 2020-10-09 NOTE — TOC Initial Note (Signed)
Transition of Care [facility]) - Initial/Assessment Note    Patient Details  Name: Natalie [name] MRN: [id number] Date of Birth: 22-Apr-1956  [facility] CM/SW Contact:    Geralynn Ochs, LCSW Phone Number: 10/09/2020, 4:30 PM  Clinical Narrative:     CSW coordinated with Rehab Admissions Coordinator, who indicated that family had questions and unsure about disposition, would like to meet and discuss with CSW. CSW met with patient's son, Rodman Key, and sisters to discuss disposition options. Family expressed concern about her going home, definitely wanted to pursue post-acute rehab and wanted the best option for her. CSW discussed difference between CIR and SNF, and that CIR would provide the best rehabilitation for the patient. Family agreeable to continue to pursue CIR, provided permission to reach out to insurance. Family also asked about what happens if insurance declines, and CSW discussed back-up options that can be discussed further in detail if we need to, pending insurance decision. Family appreciative of update and information. CSW updated Rehab Admissions Coordinator of family choice. CSW to follow.              Expected Discharge Plan: IP Rehab Facility Barriers to Discharge: Continued Medical Work up,Insurance Authorization   Patient Goals and CMS Choice Patient states their goals for this hospitalization and ongoing recovery are:: patient unable to participate in goal setting, not fully oriented CMS Medicare.gov Compare Post Acute Care list provided to:: Patient Represenative (must comment) Choice offered to / list presented to : Adult Children  Expected Discharge Plan and Services Expected Discharge Plan: [place]     Post Acute Care Choice: IP Rehab Living arrangements for the past 2 months: Single Family Home                                      Prior Living Arrangements/Services Living arrangements for the past 2 months: Single Family  Home Lives with:: Self Patient language and need for interpreter reviewed:: No Do you feel safe going back to the place where you live?: Yes      Need for Family Participation in Patient Care: Yes (Comment) Care giver support system in place?: No (comment)   Criminal Activity/Legal Involvement Pertinent to Current Situation/Hospitalization: No - Comment as needed  Activities of Daily Living      Permission Sought/Granted Permission sought to share information with : Facility Retail banker granted to share information with : Yes, Verbal Permission Granted  Share Information with NAME: Rodman Key  Permission granted to share info w AGENCY: SNF  Permission granted to share info w Relationship: Son     Emotional Assessment Appearance:: Appears stated age Attitude/Demeanor/Rapport: Unable to Assess Affect (typically observed): Unable to Assess Orientation: : Oriented to Self,Oriented to Place      Admission diagnosis:  Encephalopathy acute [G93.40] Patient Active Problem List   Diagnosis Date Noted  . Malnutrition of moderate degree 10/01/2020  . Pressure injury of skin 09/30/2020  . Encephalopathy acute 09/30/2020  . Altered mental status   . Respiratory failure ([place])   . Encephalopathy 09/28/2020  . Status epilepticus ([place])   . NSTEMI (non-ST elevated myocardial infarction) ([place]) 07/24/2017  . Cataracts, bilateral 03/30/2017  . COPD (chronic obstructive pulmonary disease) ([place]) 03/30/2017  . Depression 03/30/2017  . Drug addiction ([place]) 03/30/2017  . Seizures ([place]) 03/30/2017  . Chronic migraine without aura without status migrainosus, not intractable 07/26/2014  .  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 CKD (chronic kidney disease) stage 3, GFR 30-59 ml/min (HCC) 08/04/2013  . Edema 03/24/2011  . Tobacco use disorder 03/24/2011  . 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 Migraine headache 01/10/2011   PCP:   Henrietta Hoover, MD Pharmacy:   [facility], [place] [address] [place] [address] Phone: [phone] Fax: [phone]     Social Determinants of Health (SDOH) Interventions    Readmission Risk Interventions No flowsheet data found.

## 2020-10-09 NOTE — PMR Pre-admission (Signed)
PMR Admission Coordinator Pre-Admission Assessment  [name] is an 65 y.o., female MRN: [id number] DOB: [birth year] Height:   Weight: 52 kg  Insurance Information HMO:     PPO: yes     PCP:      IPA:      80/20:      OTHER:  PRIMARY: BCBS       Policy#: [id number] Subscriber: patient CM Name:       Phone#:      Fax#: [phone] Pre-Cert#: [id number]  auth good for 7 days from 4/6 with updates due to fax listed above.  Employer:  Benefits:  Phone #:[phone]     Name: Linton Ham. Date: 07/08/19-still active     Deduct: $800 ($800 met)      Out of Pocket Max: $4,500 ($1,048.97 met)      Life Max: NA CIR: 80% coverage, 20% co-insurance     SNF: 80% coverage, 20% co-insurance; limited to 200 days/year Outpatient: 80% coverage; limited to 20 visits per discipline/year     Co-Pay: 20% Home Health: 80% coverage;limited by medical necessity      Co-Pay: 20% DME: 80% coverage     Co-Pay: 20% Providers: in-network  SECONDARY: Medicaid [place] Access - verified 10/11/20 with coverage code [facility] Policy#: [id number]     Phone#: [phone]  Financial Counselor:       Phone#:   The "Data Collection Information Summary" for patients in Inpatient Rehabilitation Facilities with attached "Privacy Act [place] Records" was provided and verbally reviewed with: N/A  Emergency Contact Information Contact Information    Name Relation Home Work Mobile   [facility] Son [phone]     Jalei, Shibley [phone]  [phone]      Current Medical History  Patient Admitting Diagnosis: debility  History of Present Illness: Natalie [name] is a 65 year old female with history of COPD- oxygen dependent, Hep C, remote seizure, CKD-III (Dr.Kolluru), frequent falls, chronic pain-on methadone, major depression (recent loss of partner and pet) who was admitted to [facility] with 09/28/20 after a neighbor found her confused with difficulty talking and covered in stool.  UDS positive  for methadone and benzo's. CT head negative and MRI ordered but aborted due to generalized seizure activity not responsive to ativan and started on propofol. She had tachypnea and tachycardia and was intubated emergently. She was loaded with Keppra and antibiotics due to concerns of aspiration PNA.  Dr.Kirkpatrick expressed concerns of PRES but MRI brain negative for acute changes.  LP done to rule out aseptic/viral meningitis and acycovir added. She continued to be encephalopathic and was transferred to [facility] for LT-EEG monitoring.  EEG negative for seizures and showed metabolic encephalopathy and was found to have low folic YJWL/K95 levels. HSV PCR negative and acyclovir d/c.  She tolerated extubation 03/30 and respiratory status stable. Keppra d/c per neurology input. 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  Stroke work up initiated and recommendations for 30-day event monitor were made.  Therapy evaluations were completed and pt was recommended for CIR.   Patient's medical record from [facility] has been reviewed by the rehabilitation admission coordinator and physician.  Past Medical History  Past Medical History:  Diagnosis Date  . Cataracts, bilateral   . Chickenpox cataracts  . COPD (chronic obstructive pulmonary  disease) ([place])   . Depression   . Drug addiction ([place])   . Headache   . Hepatitis C virus infection   . Hepatitis C without hepatic coma   . History of chicken pox   . History of colon polyps   . Seizures ([place])     Family History   family history includes Breast cancer in her paternal grandmother.  Prior Rehab/Hospitalizations Has the patient had prior rehab or hospitalizations prior to admission? No  Has the patient had major surgery during 100 days  prior to admission? No   Current Medications  Current Facility-Administered Medications:  .  atorvastatin (LIPITOR) tablet 40 mg, 40 mg, Oral, Daily, Rosalin Hawking, MD, 40 mg at 10/10/20 1059 .  chlorhexidine gluconate (MEDLINE KIT) (PERIDEX) 0.12 % solution 15 mL, 15 mL, Mouth Rinse, BID, Salvadore Dom E, NP, 15 mL at 10/10/20 0834 .  clopidogrel (PLAVIX) tablet 75 mg, 75 mg, Oral, Daily, Amie Portland, MD, 75 mg at 10/10/20 1059 .  folic acid (FOLVITE) tablet 1 mg, 1 mg, Oral, Daily, Pashayan, Redgie Grayer, MD, 1 mg at 10/10/20 1058 .  gabapentin (NEURONTIN) 250 MG/5ML solution 100 mg, 100 mg, Oral, QHS, Samtani, Jai-Gurmukh, MD, 100 mg at 10/10/20 2312 .  ipratropium-albuterol (DUONEB) 0.5-2.5 (3) MG/3ML nebulizer solution 3 mL, 3 mL, Nebulization, Q6H PRN, Hosie Poisson, MD .  methadone (DOLOPHINE) tablet 25 mg, 25 mg, Oral, Q8H, Salvadore Dom E, NP, 25 mg at 10/11/20 0549 .  pantoprazole sodium (PROTONIX) 40 mg/20 mL oral suspension 40 mg, 40 mg, Oral, Q1200, Erick Colace, NP, 40 mg at 10/10/20 1236 .  polyethylene glycol (MIRALAX / GLYCOLAX) packet 17 g, 17 g, Oral, Daily PRN, Erick Colace, NP, 17 g at 10/08/20 0917 .  Resource ThickenUp Clear, , Oral, PRN, Erick Colace, NP, Given at 10/05/20 [phone] .  thiamine (B-1) injection 100 mg, 100 mg, Intravenous, Daily **OR** thiamine tablet 100 mg, 100 mg, Oral, Daily, Joselyn Glassman A, RPH .  vitamin B-12 (CYANOCOBALAMIN) tablet 1,000 mcg, 1,000 mcg, Oral, Daily, Pashayan, Redgie Grayer, MD, 1,000 mcg at 10/10/20 1059  Patients Current Diet:  Diet Order            DIET - DYS 1 Room service appropriate? Yes; Fluid consistency: Thin  Diet effective now                 Precautions / Restrictions Precautions Precautions: Fall Precaution Comments: seizures Restrictions Weight Bearing Restrictions: No   Has the patient had 2 or more falls or a fall with injury in the past year? No  Prior Activity Level Community (5-7x/wk):  drives, works  Prior Functional Level Self Care: Did the patient need help bathing, dressing, using the toilet or eating? Independent  Indoor Mobility: Did the patient need assistance with walking from room to room (with or without device)? Independent  Stairs: Did the patient need assistance with internal or external stairs (with or without device)? Independent  Functional Cognition: Did the patient need help planning regular tasks such as shopping or remembering to take medications? Independent  Home Assistive Devices / Equipment Home Equipment: Walker - 2 wheels,Crutches,Bedside commode,Shower seat,Grab bars - toilet,Grab bars - tub/shower,Wheelchair - manual,Hospital bed  Prior Device Use: Indicate devices/aids used by the patient prior to current illness, exacerbation or injury? has cane but hasn't used in a few months per son report  Current Functional Level Cognition  Arousal/Alertness: Awake/alert Overall Cognitive Status: Impaired/Different from baseline Current Attention Level: Sustained Orientation Level:  Oriented to person,Oriented to place,Oriented to situation Following Commands: Follows one step commands consistently,Follows one step commands with increased time,Follows multi-step commands inconsistently Safety/Judgement: Decreased awareness of safety,Decreased awareness of deficits General Comments: Asking therapist to "turn on her TVs" pointing to remote monitor and call bell monitor on wall Attention: Focused,Sustained Focused Attention: Appears intact Sustained Attention: Impaired Sustained Attention Impairment: Verbal basic Memory: Impaired Memory Impairment: Storage deficit,Retrieval deficit,Decreased recall of new information Awareness: Impaired Awareness Impairment: Intellectual impairment,Emergent impairment Problem Solving: Impaired Problem Solving Impairment: Verbal basic,Functional basic Safety/Judgment: Impaired    Extremity Assessment (includes  Sensation/Coordination)  Upper Extremity Assessment: Generalized weakness  Lower Extremity Assessment: RLE deficits/detail,LLE deficits/detail RLE Deficits / Details: MMT scores of 4 to 4+ grossly RLE Sensation:  (denies numbness/tingling) RLE Coordination: decreased fine motor,decreased gross motor LLE Deficits / Details: MMT scores of 4 to 4+ grossly LLE Sensation:  (denies numbness/tingling) LLE Coordination: decreased fine motor,decreased gross motor    ADLs  Overall ADL's : Needs assistance/impaired Eating/Feeding: Supervision/ safety,Set up,Sitting Grooming: Min guard,Wash/dry hands,Standing,Wash/dry face Grooming Details (indicate cue type and reason): at sink, cues to use soap Upper Body Bathing: Minimal assistance,Sitting Lower Body Bathing: Minimal assistance,Sit to/from stand Upper Body Dressing : Minimal assistance,Sitting Lower Body Dressing: Minimal assistance,Sit to/from stand Toilet Transfer: Minimal assistance,Ambulation,Regular Toilet,Grab bars Toilet Transfer Details (indicate cue type and reason): cueing for safey, pt attempting to get OOB independently therefore OTA providing assistance, pt unsteady on feet requiring one person HHA Toileting- Clothing Manipulation and Hygiene: Minimal assistance,Sit to/from stand Toileting - Clothing Manipulation Details (indicate cue type and reason): clothing mgmt and safety Functional mobility during ADLs: Minimal assistance,Rolling walker,Cueing for safety,Cueing for sequencing General ADL Comments: Pt recieved getting OOB to go to the bathroom, provided one person HHA, significant confusion and impulsivity noted    Mobility  Overal bed mobility: Needs Assistance Bed Mobility: Supine to Sit,Sit to Supine Supine to sit: Supervision Sit to supine: Supervision General bed mobility comments: cueing for safety, supervision for safety    Transfers  Overall transfer level: Needs assistance Equipment used: 1 person hand held  assist Transfers: Sit to/from Stand Sit to Stand: Min assist General transfer comment: min assist to steady from EOB due to impulsivity using one person HHA but recommending RW for longer distances    Ambulation / Gait / Stairs / Wheelchair Mobility  Ambulation/Gait Ambulation/Gait assistance: Min assist,Mod assist Gait Distance (Feet): 200 Feet Assistive device: Rolling walker (2 wheeled),1 person hand held assist Gait Pattern/deviations: Step-through pattern,Decreased step length - right,Decreased step length - left,Decreased stride length,Narrow base of support General Gait Details: Slow and guarded with narrow BOS at times; erratic step width; Pt able to improve posture with cues but not maintaining; tends to reach out for UE support when walking without RW Gait velocity: Decreased Gait velocity interpretation: <1.8 ft/sec, indicate of risk for recurrent falls    Posture / Balance Dynamic Sitting Balance Sitting balance - Comments: Static sitting EOB with min guard. Static Standing Balance Single Leg Stance - Right Leg: 2 (seconds) Single Leg Stance - Left Leg: 2 (seconds) Balance Overall balance assessment: Needs assistance Sitting-balance support: No upper extremity supported,Feet supported Sitting balance-Leahy Scale: Fair Sitting balance - Comments: Static sitting EOB with min guard. Standing balance support: Bilateral upper extremity supported,During functional activity,No upper extremity supported Standing balance-Leahy Scale: Poor Standing balance comment: relies on UE support dynamically, able to engage in ADLs statically with min guard Single Leg Stance - Right Leg: 2 (seconds) Single Leg Stance -  Left Leg: 2 (seconds)    Special needs/care consideration Skin Ecchymosis: arm/bilateral; Catheter entry/exit: back/lower and Designated visitor Carlisle Torgeson, son   Previous Home Environment (from acute therapy documentation) Living Arrangements: Children (Son in the process  of moving in with her)  Lives With: Son Available Help at Discharge: Family,Available 24 hours/day Type of Home: House Home Layout: Two level Alternate Level Stairs-Rails: Right Alternate Level Stairs-Number of Steps: 14 Home Access: Stairs to enter Entrance Stairs-Rails: Left Entrance Stairs-Number of Steps: 5 Bathroom Shower/Tub: Chiropodist: Standard Bathroom Accessibility: Yes How Accessible: Accessible via walker [place]: No Additional Comments: Unsure how much is accurate, pt is poor historian. She states her son has been living with her, even before her significant other passed away.  Discharge Living Setting Plans for Discharge Living Setting: Patient's home Type of Home at Discharge: House Discharge Home Layout: Two level Alternate Level Stairs-Rails: Right Alternate Level Stairs-Number of Steps: 14 Discharge Home Access: Stairs to enter Entrance Stairs-Rails: Left Entrance Stairs-Number of Steps: 5 Discharge Bathroom Shower/Tub: Tub/shower unit Discharge Bathroom Toilet: Standard Discharge Bathroom Accessibility: Yes How Accessible: Accessible via walker Does the patient have any problems obtaining your medications?: No  Social/Family/Support Systems Anticipated Caregiver: Vesper Trant, son Anticipated Caregiver's Contact Information: [phone] Caregiver Availability: 24/7 Discharge Plan Discussed with Primary Caregiver: Yes Is Caregiver In Agreement with Plan?: Yes Does Caregiver/Family have Issues with Lodging/Transportation while Pt is in Rehab?: No  Goals Patient/Family Goal for Rehab: Mod I: PT/OT/SLP Expected length of stay: 7-10 days Pt/Family Agrees to Admission and willing to participate: Yes Program Orientation Provided & Reviewed with Pt/Caregiver Including Roles  & Responsibilities: Yes  Decrease burden of Care through IP rehab admission: NA  Possible need for SNF placement upon discharge: Not  anticipated  Patient Condition: I have [name], spoken with CSW, and patient and son. I met with patient at the bedside for inpatient rehabilitation assessment.  Patient will benefit from ongoing PT, OT and SLP, can actively participate in 3 hours of therapy a day 5 days of the week, and can make measurable gains during the admission.  Patient will also benefit from the coordinated team approach during an Inpatient Acute Rehabilitation admission.  The patient will receive intensive therapy as well as Rehabilitation physician, nursing, social worker, and care management interventions.  Due to bladder management, bowel management, safety, skin/wound care, disease management, medication administration, pain management and patient education the patient requires 24 hour a day rehabilitation nursing.  The patient is currently min to mod assist with mobility and basic ADLs.  Discharge setting and therapy post discharge at home with home health is anticipated.  Patient has agreed to participate in the Acute Inpatient Rehabilitation Program and will admit today.  Preadmission Screen Completed By:  Retta Diones, 10/11/2020 10:16 AM ______________________________________________________________________   Discussed status with Dr. Dagoberto Ligas and Dr. Naaman Plummer on 10/11/20 at 58 and received approval for admission today.  Admission Coordinator:  Retta Diones, RN, time 1300/Date 10/11/20   Assessment/Plan: Diagnosis: 1. Does the need for close, 24 hr/day Medical supervision in concert with the patient's rehab needs make it unreasonable for this patient to be served in a less intensive setting? Yes 2. Co-Morbidities requiring supervision/potential complications: L MCA infarct, seizures?, CKD3, chronic pain, on methadone; depression- lost partner/pet; frequent falls, COPD- O2 dependent.  3. Due to bladder management, bowel management, safety, skin/wound care, disease management,  medication administration, pain management and patient education,  does the patient require 24 hr/day rehab nursing? Yes 4. Does the patient require coordinated care of a physician, rehab nurse, PT, OT, and SLP to address physical and functional deficits in the context of the above medical diagnosis(es)? Yes Addressing deficits in the following areas: balance, endurance, locomotion, strength, transferring, bowel/bladder control, bathing, dressing, feeding, grooming, toileting, cognition, speech, language and swallowing 5. Can the patient actively participate in an intensive therapy program of at least 3 hrs of therapy 5 days a week? Yes 6. The potential for patient to make measurable gains while on inpatient rehab is good and fair 7. Anticipated functional outcomes upon discharge from inpatient rehab: modified independent and supervision PT, modified independent and supervision OT, supervision and min assist SLP 8. Estimated rehab length of stay to reach the above functional goals is: 7-10 days 9. Anticipated discharge destination: Home 10. Overall Rehab/Functional Prognosis: good and fair   MD Signature:

## 2020-10-09 NOTE — Progress Notes (Signed)
PROGRESS NOTE   Natalie [name]  HQI:[id number] DOB: 1955/07/30 DOA: 09/30/2020 PCP: Henrietta Hoover, MD  Brief Narrative:   60 white female Chronic hep C COPD chronic respiratory failure 2 L-chronic tobacco use Methadone habituation with prior drug addiction/opiates  Admitted at [facility] 1/32-4/40 toxic metabolic encephalopathy, aphasic-CT head MR   -LP-Rx acyclovir?  Aseptic/viral meningitis -Had upper extremity posturing therefore considered to have morning status epilepticus 3/27 transfer to [facility] 3/28 started on methadone clonidine developed hypotension 3/29 Precedex started because of agitation pressors discontinued 3/30 acyclovir discontinued and patient extubated core track placed Precedex continued-SLP could not be cued successfully 3/31 started dysphagia diet-patient pulled out feeding tube CVL-Precedex discontinued 4/2 transferred to [facility] 4/5 MRI shows Acute stroke   Hospital-Problem based course  Toxic metabolic encephalopathy on admission without source Suspect either methadone withdrawal or overdose of the same DDx?  Neurosyphilis?  Pseudodementia D/c Keppra per discussion with Dr. Alferd Patee Other work-up inclusive of MRI LP EEG all negative Repeat MRI 4/5 shows CVA--so will pursue full stroke pathway-ECHO/CTA Lipid Panel/A1c all pending foll methylmalonic acid  Methadone habituation has been on Methadonesince the 90s-has been taking opiates since she was 14 because of dental procedures which is how she became habituated to the same [see notes obtained from [facility] office 4/4]  was going to [facility] [[phone] currently on 25 3 times daily methadone clinic  At the son's request I will request palliative care to help wean the methadone to off if possible prior to discharge Chronic hep C Await viral load from 4/2-ammonia level is negative Mild hyperkalemia AKI 2/2 ATN--CKDiiia Saline lock NS 75 cc/H  periodic labs Downward dose  adjust gabapentin to 100 at bedtime Chr smoker-prior chr resp fialure on 2L oxygen Stable off oxygen Dysphagia/cognitive deficit?  SLP rec's dys 1 diet Appreciate cognitive linguistic eval   DVT prophylaxis: loveneox Code Status: FULL Family Communication: Son updated at the bedside by me 4/3 [facility] Son [phone]  Disposition:  Status is: Inpatient  Remains inpatient appropriate because:Hemodynamically unstable, Ongoing active pain requiring inpatient pain management and Inpatient level of care appropriate due to severity of illness\  Dispo: The patient is from: Home               Anticipated d/c is to: Either skilled facility or CIR in the next several days              Patient currently is medically stable to d/c.   Difficult to place patient No   Consultants:   None yet  Procedures: n  Antimicrobials: n    Subjective:  Tangential Continues to have some passpointing MRI shows stroke that happened sometime between the 26th and 4/5 I had a 30-minute discussion with her son about implications of a stroke and how sometimes these diagnoses can be missed on intubated patients who seem otherwise physically able to do some ADLs He expressed understanding We had a long discussion about patient's methadone in addition to work-up and I reiterated our recommendations that CR would probably be the best disposition for her   Objective: Vitals:   10/09/20 0348 10/09/20 0859 10/09/20 1142 10/09/20 1611  BP: (!) 173/82 140/90 (!) 151/73 (!) 149/85  Pulse: 75 71 79 70  Resp: $Remo'19 18 18 18  'DdQKC$ Temp: 97.8 F (36.6 C) 98 F (36.7 C) 98 F (36.7 C) 97.8 F (36.6 C)  TempSrc: Oral Oral Oral Oral  SpO2: 96% 94% 96% 95%  Weight: 52 kg  No intake or output data in the 24 hours ending 10/09/20 1722 Filed Weights   10/05/20 0500 10/08/20 0539 10/09/20 0348  Weight: 47.7 kg 52.5 kg 52 kg    Examination:  No icterus no pallor Chest clear no rales rhonchi S1-S2 no  murmur Quite asthenic and frail No lower extremity edema Finger-nose-finger is dysmetric bilaterally Reflexes are brisk sensory intact-gait not assessed   Data Reviewed: personally reviewed   CBC    Component Value Date/Time   WBC 5.7 10/07/2020 0123   RBC 4.17 10/07/2020 0123   HGB 12.9 10/07/2020 0123   HGB 11.4 (L) 05/09/2012 0748   HCT 40.3 10/07/2020 0123   HCT 34.3 (L) 05/09/2012 0748   PLT 271 10/07/2020 0123   PLT 235 05/09/2012 0748   MCV 96.6 10/07/2020 0123   MCV 98 05/09/2012 0748   MCH 30.9 10/07/2020 0123   MCHC 32.0 10/07/2020 0123   RDW 15.4 10/07/2020 0123   RDW 14.6 (H) 05/09/2012 0748   LYMPHSABS 1.2 10/07/2020 0123   LYMPHSABS 1.5 05/09/2012 0748   MONOABS 0.5 10/07/2020 0123   MONOABS 0.8 05/09/2012 0748   EOSABS 0.0 10/07/2020 0123   EOSABS 0.0 05/09/2012 0748   BASOSABS 0.0 10/07/2020 0123   BASOSABS 0.0 05/09/2012 0748   CMP Latest Ref Rng & Units 10/09/2020 10/07/2020 10/06/2020  Glucose 70 - 99 mg/dL 102(H) 85 91  BUN 8 - 23 mg/dL 10 19 27(H)  Creatinine 0.44 - 1.00 mg/dL 0.98 1.06(H) 1.04(H)  Sodium 135 - 145 mmol/L 139 138 137  Potassium 3.5 - 5.1 mmol/L 3.7 4.0 5.0  Chloride 98 - 111 mmol/L 102 101 102  CO2 22 - 32 mmol/L 30 31 16(L)  Calcium 8.9 - 10.3 mg/dL 9.6 9.3 9.6  Total Protein 6.5 - 8.1 g/dL - 6.2(L) -  Total Bilirubin 0.3 - 1.2 mg/dL - 0.6 -  Alkaline Phos 38 - 126 U/L - 61 -  AST 15 - 41 U/L - 19 -  ALT 0 - 44 U/L - 26 -     Radiology Studies: MR BRAIN W WO CONTRAST  Addendum Date: 10/09/2020   ADDENDUM REPORT: 10/09/2020 10:45 ADDENDUM: Study discussed by telephone with Neurology Dr. Rory Percy at 1033 hours on 10/09/2020. Electronically Signed   By: Genevie Ann M.D.   On: 10/09/2020 10:45   Result Date: 10/09/2020 CLINICAL DATA:  65 year old female with altered mental status. History of seizures. EXAM: MRI HEAD WITHOUT AND WITH CONTRAST TECHNIQUE: Multiplanar, multiecho pulse sequences of the brain and surrounding structures were  obtained without and with intravenous contrast. CONTRAST:  29mL GADAVIST GADOBUTROL 1 MMOL/ML IV SOLN COMPARISON:  Brain MRI 09/29/2020. FINDINGS: Brain: Roughly 6 cm confluent area of restricted diffusion in the left anterior MCA territory (series 2, image 30) affecting the left insula, left inferior frontal gyrus, left temporal and frontal operculum. Cytotoxic edema and gyriform post ischemic enhancement. Mild associated multifocal petechial hemorrhage (series 7, image 65). No significant mass effect. No other restricted diffusion. Advanced bilateral cerebral white matter T2 and FLAIR hyperintensity in both hemispheres appears stable, including bilateral deep white matter capsule involvement. Chronic lacunar infarct of the left thalamus. Chronic lacunar infarcts versus perivascular spaces in the right lentiform. Patchy T2 and FLAIR hyperintensity in the pons. No chronic cortical encephalomalacia identified. No other intracranial blood products. No midline shift, mass effect, evidence of mass lesion, ventriculomegaly, extra-axial collection. Cervicomedullary junction and pituitary are within normal limits. No other abnormal intracranial enhancement.  No dural thickening. Vascular: Major intracranial vascular  flow voids are stable since last month. Pneumatized right anterior clinoid process. Major dural venous sinuses are enhancing and appear to be patent. Skull and upper cervical spine: Negative for age visible cervical spine. Visualized bone marrow signal is within normal limits. Sinuses/Orbits: Stable, negative. Other: Trace mastoid fluid has increased. Negative visible nasopharynx. Visible internal auditory structures appear normal. Negative visible scalp and face. IMPRESSION: 1. Acute 6 cm infarct in the anterior left MCA territory is new from the MRI 09/29/2020. Multifocal mild petechial hemorrhage. No malignant hemorrhagic transformation or no mass effect. 2. Elsewhere stable MRI appearance of the brain since  last month. Chronically advanced but nonspecific cerebral white matter disease. Chronic lacunar infarcts in the deep gray matter nuclei. Electronically Signed: By: Genevie Ann M.D. On: 10/09/2020 10:23     Scheduled Meds: . chlorhexidine gluconate (MEDLINE KIT)  15 mL Mouth Rinse BID  . clopidogrel  75 mg Oral Daily  . folic acid  1 mg Oral Daily  . gabapentin  100 mg Oral QHS  . ipratropium-albuterol  3 mL Nebulization BID  . methadone  25 mg Oral Q8H  . pantoprazole sodium  40 mg Oral Q1200  . thiamine  100 mg Per Tube Daily   Or  . thiamine injection  100 mg Intravenous Daily  . vitamin B-12  1,000 mcg Oral Daily   Continuous Infusions:    LOS: 9 days   Time spent: [place], MD [facility] To contact the attending provider between 7A-7P or the covering provider during after hours 7P-7A, please log into the web site [email or web address] and access using universal  password for that web site. If you do not have the password, please call the hospital operator.  10/09/2020, 5:22 PM

## 2020-10-09 NOTE — Progress Notes (Signed)
Nutrition Follow-up  DOCUMENTATION CODES:  Non-severe (moderate) malnutrition in context of chronic illness  INTERVENTION:  Continue current diet as ordered  Magic cup TID with meals, each supplement provides 290 kcal and 9 grams of protein  NUTRITION DIAGNOSIS:  Moderate Malnutrition related to chronic illness (COPD) as evidenced by moderate fat depletion,severe fat depletion,moderate muscle depletion,severe muscle depletion.  Intake improving  GOAL:  Patient will meet greater than or equal to 90% of their needs  progressing  MONITOR:  Labs,PO intake,Supplement acceptance,Diet advancement  REASON FOR ASSESSMENT:  Consult Enteral/tube feeding initiation and management  ASSESSMENT:  Pt with PMH of COPD, CKD stage III, depression, drug addiction on methadone and gabapentin PTA, hepatitis C, and seizures admitted to [facility] 3/25 with AMS, aphasic and covered in stool. Pt with acute encephalopathy of unknown etiology differential being status epilepticus, viral encephalitis, drug induced, PRES vs hepatic encephalopathy.  3/27 tx to [facility] for continuous EEG 3/30  Extubated; cortrak placed tip gastric. Pt awake and agitated trying to get out of bed 3/31 - pt removed cortrak tube, MBS completed and diet advanced to D2, nectar thick liquids 4/1 - diet downgraded to D1 with nectar thick liquids per pt request  Medications reviewed and include: folic acid 1mg  daily, thiamine 100mg  daily, vitamin b12 1048mcg daily Labs reviewed  Pt resting in bed at the time of visit. Alert and conversant. Lunch tray at bedside well consumed. Pt reports that her appetite is improving and she feels she is doing much better with intake. Satisfied with current diet order (SLP cleared for D2, but pt requested D1) Magic cup on lunch tray 100% consumed. Pt reports that she likes the orange and chocolate flavors. Will add to each meal. Noted 42% average intake x 3 recorded meals (ranges 25-50%). Seems to be trending  up.   NUTRITION - FOCUSED PHYSICAL EXAM: Flowsheet Row Most Recent Value  Orbital Region Severe depletion  Upper Arm Region Moderate depletion  Thoracic and Lumbar Region Moderate depletion  Buccal Region Unable to assess  Temple Region Severe depletion  Clavicle Bone Region Moderate depletion  Clavicle and Acromion Bone Region Severe depletion  Scapular Bone Region Unable to assess  Dorsal Hand Moderate depletion  Patellar Region Moderate depletion  Anterior Thigh Region Moderate depletion  Posterior Calf Region Moderate depletion  Edema (RD Assessment) None  Hair Reviewed  Eyes Unable to assess  Mouth Unable to assess  Skin Reviewed  [dry]  Nails Reviewed     Diet Order:   Diet Order            DIET - DYS 1 Room service appropriate? Yes; Fluid consistency: Nectar Thick  Diet effective now                EDUCATION NEEDS:  No education needs have been identified at this time  Skin:  Skin Assessment: Reviewed RN Assessment  Last BM:  unsure  Height:  Ht Readings from Last 1 Encounters:  09/28/20 5\' 3"  (1.6 m)   Weight:  Wt Readings from Last 3 Encounters:  10/09/20 52 kg  09/28/20 54.4 kg  07/25/17 49.6 kg    Ideal Body Weight:  52.2 kg  BMI:  Body mass index is 20.31 kg/m.  Estimated Nutritional Needs:   Kcal:  1500-1750  Protein:  80-100 grams  Fluid:  >1.5 L/day   Ranell Patrick, RD, LDN Clinical Dietitian Pager on [place]

## 2020-10-09 NOTE — Progress Notes (Signed)
Pt unable to complete MRI this evening d/t pt "being nauseous and scared". Dr. Hal Hope informed. See new orders.

## 2020-10-10 ENCOUNTER — Inpatient Hospital Stay (HOSPITAL_COMMUNITY): Payer: BC Managed Care – PPO

## 2020-10-10 DIAGNOSIS — I6389 Other cerebral infarction: Secondary | ICD-10-CM

## 2020-10-10 DIAGNOSIS — I639 Cerebral infarction, unspecified: Secondary | ICD-10-CM

## 2020-10-10 DIAGNOSIS — I6330 Cerebral infarction due to thrombosis of unspecified cerebral artery: Secondary | ICD-10-CM | POA: Insufficient documentation

## 2020-10-10 DIAGNOSIS — E538 Deficiency of other specified B group vitamins: Secondary | ICD-10-CM

## 2020-10-10 DIAGNOSIS — F17200 Nicotine dependence, unspecified, uncomplicated: Secondary | ICD-10-CM

## 2020-10-10 DIAGNOSIS — R4189 Other symptoms and signs involving cognitive functions and awareness: Secondary | ICD-10-CM

## 2020-10-10 LAB — LIPID PANEL
Cholesterol: 207 mg/dL — ABNORMAL HIGH (ref 0–200)
HDL: 57 mg/dL (ref >40)
LDL Cholesterol: 136 mg/dL — ABNORMAL HIGH (ref 0–99)
Total CHOL/HDL Ratio: 3.6 RATIO
Triglycerides: 69 mg/dL (ref <150)
VLDL: 14 mg/dL (ref 0–40)

## 2020-10-10 LAB — RENAL FUNCTION PANEL
Albumin: 3.4 g/dL — ABNORMAL LOW (ref 3.5–5.0)
Anion gap: 9 (ref 5–15)
BUN: 16 mg/dL (ref 8–23)
CO2: 32 mmol/L (ref 22–32)
Calcium: 10.1 mg/dL (ref 8.9–10.3)
Chloride: 100 mmol/L (ref 98–111)
Creatinine, Ser: 1.19 mg/dL — ABNORMAL HIGH (ref 0.44–1.00)
GFR, Estimated: 51 mL/min — ABNORMAL LOW (ref >60)
Glucose, Bld: 102 mg/dL — ABNORMAL HIGH (ref 70–99)
Phosphorus: 5.3 mg/dL — ABNORMAL HIGH (ref 2.5–4.6)
Potassium: 4 mmol/L (ref 3.5–5.1)
Sodium: 141 mmol/L (ref 135–145)

## 2020-10-10 LAB — ECHOCARDIOGRAM COMPLETE
Area-P 1/2: 3.08 cm2
P 1/2 time: 342 msec
S' Lateral: 3 cm
Weight: 1834.23 oz

## 2020-10-10 LAB — HEMOGLOBIN A1C
Hgb A1c MFr Bld: 5.6 % (ref 4.8–5.6)
Mean Plasma Glucose: 114.02 mg/dL

## 2020-10-10 MED ORDER — GADOBUTROL 1 MMOL/ML IV SOLN
5.5000 mL | Freq: Once | INTRAVENOUS | Status: AC | PRN
  Administered 2020-10-10: 5.5 mL via INTRAVENOUS

## 2020-10-10 MED ORDER — ATORVASTATIN CALCIUM 40 MG PO TABS
40.0000 mg | ORAL_TABLET | Freq: Every day | ORAL | Status: DC
  Administered 2020-10-10 – 2020-10-11 (×2): 40 mg via ORAL
  Filled 2020-10-10 (×2): qty 1

## 2020-10-10 MED ORDER — IPRATROPIUM-ALBUTEROL 0.5-2.5 (3) MG/3ML IN SOLN: 3.0000 mL | Freq: Four times a day (QID) | RESPIRATORY_TRACT | Status: DC | PRN

## 2020-10-10 NOTE — Progress Notes (Signed)
PROGRESS NOTE    Natalie [name]  ZOX:[id number] DOB: 05-Mar-1956 DOA: 09/30/2020 PCP: Henrietta Hoover, MD    No chief complaint on file.   Brief Narrative:  65 year old lady prior history of COPD, depression hepatitis C viral infection presents with altered mental status.  MRI on admission negative for a repeat MRI on 10/09/2020 showed left MCA infarct.  Neurology on board and she is currently undergoing stroke work-up.  Assessment & Plan:   Active Problems:   Respiratory failure (HCC)   Encephalopathy acute   Malnutrition of moderate degree  Anterior left MCA infarct Evident on the repeat MRI.  MRA of the head and neck is pending Neurology on board recommending 81-XBJ cardioembolic monitoring. Echocardiogram showed left ventricular ejection fraction of 56% without any regional wall abnormalities.  LDL is 136, hemoglobin A1c at 5.6 Therapy evaluations recommending CIR.  SLP evaluation recommending dysphagia 1 diet with nectar thick fluids. Continue with Plavix 75 mg and statin 40 mg daily. Venous duplex of the lower extremity is negative for DVT.  Pt refusing going to cir or SNF, wants to go home.    History of substance abuse Currently on methadone.   GERD Continue with PPI.  Hyperlipidemia LDL is 136 and continue with statin.   COPD No wheezing heard on exam.     DVT prophylaxis: (Lovenox) Code Status: Full code.  Family Communication: none at bedside.  Disposition:   Status is: Inpatient  Remains inpatient appropriate because:Ongoing diagnostic testing needed not appropriate for outpatient work up   Dispo: The patient is from: Home              Anticipated d/c is to: CIR              Patient currently is not medically stable to d/c.   Difficult to place patient No       Consultants:   Neurology.   Procedures:  MRI brain shows 6 cm stroke.  MRA pending.  Echocardiogram done.  Venous duplex lower extremity.    Antimicrobials: none.     Subjective: No chest pain or sob. No nausea, vomiting or abdominal pain.   Objective: Vitals:   10/10/20 0404 10/10/20 0753 10/10/20 0755 10/10/20 1133  BP: 136/71 124/61  128/70  Pulse: 72     Resp: 16 12    Temp: 98.6 F (37 C) 98.2 F (36.8 C)  98.3 F (36.8 C)  TempSrc: Oral Oral  Oral  SpO2: 98%  93%   Weight: 52 kg       Intake/Output Summary (Last 24 hours) at 10/10/2020 1204 Last data filed at 10/09/2020 1300 Gross per 24 hour  Intake 320 ml  Output --  Net 320 ml   Filed Weights   10/08/20 0539 10/09/20 0348 10/10/20 0404  Weight: 52.5 kg 52 kg 52 kg    Examination:  General exam: Appears calm and comfortable  Respiratory system: Clear to auscultation. Respiratory effort normal. Cardiovascular system: S1 & S2 heard, RRR. No JVD, No pedal edema. Gastrointestinal system: Abdomen is nondistended, soft and nontender. Normal bowel sounds heard. Central nervous system: Alert , has mild right upper extremity weakness, no sensory deficits Extremities: Symmetric 5 x 5 power. Skin: No rashes, lesions or ulcers Psychiatry:  Mood & affect appropriate.     Data Reviewed: I have personally reviewed following labs and imaging studies  CBC: Recent Labs  Lab 10/04/20 0606 10/07/20 0123  WBC 7.2 5.7  NEUTROABS  --  4.0  HGB  12.2 12.9  HCT 37.5 40.3  MCV 94.2 96.6  PLT 182 779    Basic Metabolic Panel: Recent Labs  Lab 10/04/20 0606 10/06/20 0240 10/07/20 0123 10/09/20 0705 10/10/20 0406  NA 138 137 138 139 141  K 3.8 5.0 4.0 3.7 4.0  CL 101 102 101 102 100  CO2 32 16* 31 30 32  GLUCOSE 137* 91 85 102* 102*  BUN 22 27* _0 CREATININE 0.79 1.04* 1.06* 0.98 1.19*  CALCIUM 9.5 9.6 9.3 9.6 10.1  PHOS  --   --   --  4.5 5.3*    GFR: Estimated Creatinine Clearance: 39.7 mL/min (A) (by C-G formula based on SCr of 1.19 mg/dL (H)).  Liver Function Tests: Recent Labs  Lab 10/04/20 0606 10/07/20 0123 10/09/20 0705 10/10/20 0406  AST 34 19  --    --   ALT 30 26  --   --   ALKPHOS 56 61  --   --   BILITOT 0.5 0.6  --   --   PROT 5.9* 6.2*  --   --   ALBUMIN 3.0* 3.2* 3.2* 3.4*    CBG: Recent Labs  Lab 10/05/20 0746 10/05/20 1149 10/07/20 0728 10/07/20 1112 10/08/20 0841  GLUCAP 123* 150* 115* 104* 131*     Recent Results (from the past 240 hour(s))  CSF culture w Stat Gram Stain     Status: None   Collection Time: 09/30/20  1:33 PM   Specimen: CSF; Cerebrospinal Fluid  Result Value Ref Range Status   Specimen Description   Final    CSF Performed at [facility], [address], [place], [place] [address]    Special Requests   Final    NONE Performed at [facility], [address] [place], [place] [address]    Gram Stain   Final    WBC PRESENT,BOTH PMN AND MONONUCLEAR NO ORGANISMS SEEN CYTOSPIN SMEAR    Culture   Final    NO GROWTH 3 DAYS Performed at [facility], [place] [address], [place], [place] [address]    Report Status 10/03/2020 FINAL  Final         Radiology Studies: MR BRAIN W WO CONTRAST  Addendum Date: 10/09/2020   ADDENDUM REPORT: 10/09/2020 10:45 ADDENDUM: Study discussed by telephone with Neurology Dr. Rory Percy at 1033 hours on 10/09/2020. Electronically Signed   By: Genevie Ann M.D.   On: 10/09/2020 10:45   Result Date: 10/09/2020 CLINICAL DATA:  65 year old female with altered mental status. History of seizures. EXAM: MRI HEAD WITHOUT AND WITH CONTRAST TECHNIQUE: Multiplanar, multiecho pulse sequences of the brain and surrounding structures were obtained without and with intravenous contrast. CONTRAST:  45m GADAVIST GADOBUTROL 1 MMOL/ML IV SOLN COMPARISON:  Brain MRI 09/29/2020. FINDINGS: Brain: Roughly 6 cm confluent area of restricted diffusion in the left anterior MCA territory (series 2, image 30) affecting the left insula, left inferior frontal gyrus, left temporal and frontal operculum. Cytotoxic edema and gyriform post ischemic enhancement. Mild associated multifocal  petechial hemorrhage (series 7, image 65). No significant mass effect. No other restricted diffusion. Advanced bilateral cerebral white matter T2 and FLAIR hyperintensity in both hemispheres appears stable, including bilateral deep white matter capsule involvement. Chronic lacunar infarct of the left thalamus. Chronic lacunar infarcts versus perivascular spaces in the right lentiform. Patchy T2 and FLAIR hyperintensity in the pons. No chronic cortical encephalomalacia identified. No other intracranial blood products. No midline shift, mass effect, evidence of mass  lesion, ventriculomegaly, extra-axial collection. Cervicomedullary junction and pituitary are within normal limits. No other abnormal intracranial enhancement.  No dural thickening. Vascular: Major intracranial vascular flow voids are stable since last month. Pneumatized right anterior clinoid process. Major dural venous sinuses are enhancing and appear to be patent. Skull and upper cervical spine: Negative for age visible cervical spine. Visualized bone marrow signal is within normal limits. Sinuses/Orbits: Stable, negative. Other: Trace mastoid fluid has increased. Negative visible nasopharynx. Visible internal auditory structures appear normal. Negative visible scalp and face. IMPRESSION: 1. Acute 6 cm infarct in the anterior left MCA territory is new from the MRI 09/29/2020. Multifocal mild petechial hemorrhage. No malignant hemorrhagic transformation or no mass effect. 2. Elsewhere stable MRI appearance of the brain since last month. Chronically advanced but nonspecific cerebral white matter disease. Chronic lacunar infarcts in the deep gray matter nuclei. Electronically Signed: By: Genevie Ann M.D. On: 10/09/2020 10:23   ECHOCARDIOGRAM COMPLETE  Result Date: 10/10/2020    ECHOCARDIOGRAM REPORT   Patient Name:   Natalie [name] Date of Exam: 10/10/2020 Medical Rec #:  [id number]   Height:       63.0 in Accession #:    [id number]  Weight:       114.6 lb Date  of Birth:  [birth year]   BSA:          1.526 m Patient Age:    77 years    BP:           124/61 mmHg Patient Gender: F           HR:           89 bpm. Exam Location:  Inpatient Procedure: 2D Echo, 3D Echo, Cardiac Doppler, Color Doppler and Strain Analysis Indications:    Stroke I63.9  History:        Patient has no prior history of Echocardiogram examinations.                 COPD; Risk Factors:Current Smoker.  Sonographer:    Darlina Sicilian RDCS Referring Phys: [id number] ASHISH ARORA IMPRESSIONS  1. Left ventricular ejection fraction, by estimation, is 55 to 60%. Left ventricular ejection fraction by 3D volume is 56 %. The left ventricle has normal function. The left ventricle has no regional wall motion abnormalities. Left ventricular diastolic  parameters are consistent with Grade I diastolic dysfunction (impaired relaxation). The average left ventricular global longitudinal strain is -23.4 %. The global longitudinal strain is normal.  2. Right ventricular systolic function is normal. The right ventricular size is normal. Tricuspid regurgitation signal is inadequate for assessing PA pressure.  3. The mitral valve is abnormal. Trivial mitral valve regurgitation.  4. The aortic valve is calcified. Aortic valve regurgitation is mild. Mild to moderate aortic valve sclerosis/calcification is present, without any evidence of aortic stenosis.  5. The inferior vena cava is normal in size with greater than 50% respiratory variability, suggesting right atrial pressure of 3 mmHg. Comparison(s): No prior Echocardiogram. Conclusion(s)/Recommendation(s): No intracardiac source of embolism detected on this transthoracic study. A transesophageal echocardiogram is recommended to exclude cardiac source of embolism if clinically indicated. FINDINGS  Left Ventricle: Left ventricular ejection fraction, by estimation, is 55 to 60%. Left ventricular ejection fraction by 3D volume is 56 %. The left ventricle has normal function. The left  ventricle has no regional wall motion abnormalities. The average left ventricular global longitudinal strain is -23.4 %. The global longitudinal strain is normal. The left ventricular internal cavity size was  normal in size. There is no left ventricular hypertrophy. Left ventricular diastolic parameters are consistent  with Grade I diastolic dysfunction (impaired relaxation). Indeterminate filling pressures. Right Ventricle: The right ventricular size is normal. No increase in right ventricular wall thickness. Right ventricular systolic function is normal. Tricuspid regurgitation signal is inadequate for assessing PA pressure. Left Atrium: Left atrial size was normal in size. Right Atrium: Right atrial size was normal in size. Pericardium: There is no evidence of pericardial effusion. Mitral Valve: The mitral valve is abnormal. There is mild thickening of the mitral valve leaflet(s). Mild mitral annular calcification. Trivial mitral valve regurgitation. Tricuspid Valve: The tricuspid valve is grossly normal. Tricuspid valve regurgitation is trivial. Aortic Valve: The aortic valve is calcified. There is mild to moderate aortic valve annular calcification. Aortic valve regurgitation is mild. Aortic regurgitation PHT measures 342 msec. Mild to moderate aortic valve sclerosis/calcification is present, without any evidence of aortic stenosis. Pulmonic Valve: The pulmonic valve was grossly normal. Pulmonic valve regurgitation is trivial. Aorta: The aortic root and ascending aorta are structurally normal, with no evidence of dilitation. Venous: The inferior vena cava is normal in size with greater than 50% respiratory variability, suggesting right atrial pressure of 3 mmHg. IAS/Shunts: No atrial level shunt detected by color flow Doppler.  LEFT VENTRICLE PLAX 2D LVIDd:         4.40 cm         Diastology LVIDs:         3.00 cm         LV e' medial:    6.20 cm/s LV PW:         0.70 cm         LV E/e' medial:  8.7 LV IVS:         0.90 cm         LV e' lateral:   7.29 cm/s LVOT diam:     2.00 cm         LV E/e' lateral: 7.4 LV SV:         58 LV SV Index:   38              2D LVOT Area:     3.14 cm        Longitudinal                                Strain                                2D Strain GLS  -21.7 %                                (A2C):                                2D Strain GLS  -21.0 %                                (A3C):                                2D Strain GLS  -27.5 %                                (  A4C):                                2D Strain GLS  -23.4 %                                Avg:                                 3D Volume EF                                LV 3D EF:    Left                                             ventricular                                             ejection                                             fraction by                                             3D volume                                             is 56 %.                                 3D Volume EF:                                3D EF:        56 % RIGHT VENTRICLE RV S prime:     16.20 cm/s TAPSE (M-mode): 1.6 cm LEFT ATRIUM             Index       RIGHT ATRIUM          Index LA diam:        3.20 cm 2.10 cm/m  RA Area:     9.62 cm LA Vol (A2C):   21.4 ml 14.02 ml/m RA Volume:   18.50 ml 12.12 ml/m LA Vol (A4C):   17.8 ml 11.66 ml/m LA Biplane Vol: 20.5 ml 13.43 ml/m  AORTIC VALVE LVOT Vmax:   116.00 cm/s LVOT Vmean:  68.100 cm/s LVOT VTI:    0.186 m AI PHT:      342 msec  AORTA Ao Root diam: 2.70 cm MITRAL VALVE MV Area (PHT): 3.08 cm    SHUNTS  MV Decel Time: 246 msec    Systemic VTI:  0.19 m MV E velocity: 54.00 cm/s  Systemic Diam: 2.00 cm MV A velocity: 58.30 cm/s MV E/A ratio:  0.93 Lyman Bishop MD Electronically signed by Lyman Bishop MD Signature Date/Time: 10/10/2020/10:50:09 AM    Final         Scheduled Meds: . atorvastatin  40 mg Oral Daily  . chlorhexidine gluconate (MEDLINE KIT)  15 mL Mouth  Rinse BID  . clopidogrel  75 mg Oral Daily  . folic acid  1 mg Oral Daily  . gabapentin  100 mg Oral QHS  . ipratropium-albuterol  3 mL Nebulization BID  . LORazepam  0.25 mg Intravenous Once  . methadone  25 mg Oral Q8H  . ondansetron (ZOFRAN) IV  4 mg Intravenous Once  . pantoprazole sodium  40 mg Oral Q1200  . thiamine  100 mg Per Tube Daily   Or  . thiamine injection  100 mg Intravenous Daily  . vitamin B-12  1,000 mcg Oral Daily   Continuous Infusions:   LOS: 10 days       Hosie Poisson, MD [facility]   To contact the attending provider between 7A-7P or the covering provider during after hours 7P-7A, please log into the web site [email or web address] and access using universal [place] password for that web site. If you do not have the password, please call the hospital operator.  10/10/2020, 12:04 PM

## 2020-10-10 NOTE — Progress Notes (Signed)
Palliative care brief note  Discussed with Dr. Verlon Au regarding son's desire to wean Natalie [name] off of methadone prior to discharge.  Chart reviewed.  Natalie [name] has been on methadone for many years for prior substance abuse and receives medication through [facility].    As her methadone use is related to substance use and not prescribed for pain due to a life limiting illness, this is something that falls outside the scope and training of palliative care.  Our team does not have expertise in titrating or weaning medications for substance use disorders.  I would recommend reaching out to her outpatient methadone clinic if further guidance is needed.    Natalie Rough, MD [facility] Team [phone]  NO CHARGE NOTE

## 2020-10-10 NOTE — Progress Notes (Signed)
Inpatient Rehab Admissions Coordinator:   Insurance auth received, however, note new CVA on imaging yesterday and workup still being completed.  Discussed with Dr. Posey Pronto and will hold admission today to allow for acute workup to be complete.  Will f/u tomorrow.   Shann Medal, PT, DPT Admissions Coordinator [phone] 10/10/20  11:07 AM

## 2020-10-10 NOTE — H&P (Shared)
Physical Medicine and Rehabilitation Admission H&P    CC: Functional deficits due to encephalopathy.    HPI: Natalie [name] is a 65 year old female with history of COPD- oxygen dependent, Hep C, remote seizure, CKD-III (Dr.Kolluru), frequent falls, chronic pain-on methadone, major depression (recent loss of partner and pet) who was admitted to [facility] with 09/28/20 after a neighbor found her confused with difficulty talking and covered in stool.  UDS positive for methadone and benzo's. CT head negative and MRI ordered but aborted due to generalized seizure activity not responsive to ativan and started on propofol. She had tachypnea and tachycardia and was intubated emergently. She was loaded with Keppra and antibiotics due to concerns of aspiration PNA.  Dr.Kirkpatrick expressed concerns of PRES but MRI brain negative for acute changes.  LP done to rule out aseptic/viral meningitis and acycovir added. She continued to be encephalopathic and was transferred to [facility] for LT-EEG monitoring.  EEG negative for seizures and showed metabolic encephalopathy and was found to have low folic WPYK/D98 levels. HSV PCR negative and acyclovir d/c.  She tolerated extubation 03/30 and respiratory status stable. Keppra d/c per neurology input. 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  Stroke work up initiated--LE dopplers negative and 30 day event monitor recommended by Dr. Erlinda Hong to rule out source of embolic stroke. Plavix added due to ASA allergy. She has had worsening of confusion with perseveration, confabulation, poor awareness of deficits as well as balance deficits with unsteady gait. CIR recommended due to functional decline.     Review of Systems  Unable to perform ROS: Mental acuity  Eyes: Negative for  blurred vision.  Gastrointestinal: Negative for abdominal pain.  Neurological: Positive for headaches.  Psychiatric/Behavioral: Positive for memory loss.      Past Medical History:  Diagnosis Date  . Cataracts, bilateral   . Chickenpox cataracts  . COPD (chronic obstructive pulmonary disease) ([place])   . Depression   . Drug addiction ([place])   . Headache   . Hepatitis C virus infection   . Hepatitis C without hepatic coma   . History of chicken pox   . History of colon polyps   . Seizures ([place])     Past Surgical History:  Procedure Laterality Date  . APPENDECTOMY    . CHOLECYSTECTOMY    . COLONOSCOPY WITH PROPOFOL N/A 04/30/2015   Procedure: COLONOSCOPY WITH PROPOFOL;  Surgeon: Josefine Class, MD;  Location: [facility] ENDOSCOPY;  Service: Endoscopy;  Laterality: N/A;  . COLONOSCOPY WITH PROPOFOL N/A 07/13/2017   Procedure: COLONOSCOPY WITH PROPOFOL;  Surgeon: Manya Silvas, MD;  Location: [facility] ENDOSCOPY;  Service: Endoscopy;  Laterality: N/A;  . ESOPHAGOGASTRODUODENOSCOPY (EGD) WITH PROPOFOL N/A 07/13/2017   Procedure: ESOPHAGOGASTRODUODENOSCOPY (EGD) WITH PROPOFOL;  Surgeon: Manya Silvas, MD;  Location: [facility] ENDOSCOPY;  Service: Endoscopy;  Laterality: N/A;  . EYE SURGERY      Family History  Problem Relation Age of Onset  . Breast cancer Paternal Grandmother     Social History:  Has lived alone for 2 months since boyfriend passed away. Son plans on moving in to assist after discharge. 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 Per reports that she quit smoking at least 3 years ago.  She has a 20.50 pack-year smoking history. She has never used smokeless tobacco. She  reports that she does not drink alcohol and does not use drugs.   Allergies  Allergen Reactions  . Sulfasalazine Shortness Of Breath  . Aspirin   . Darvon [Propoxyphene]   . Iodine   . Penicillins   . Sulfa Antibiotics   . Zithromax [Azithromycin]     Medications Prior to Admission  Medication Sig  Dispense Refill  . [START ON 10/12/2020] atorvastatin (LIPITOR) 40 MG tablet Take 1 tablet (40 mg total) by mouth daily. 30 tablet 1  . [START ON 10/12/2020] clopidogrel (PLAVIX) 75 MG tablet Take 1 tablet (75 mg total) by mouth daily. 30 tablet 1  . [START ON 07/12/1094] folic acid (FOLVITE) 1 MG tablet Take 1 tablet (1 mg total) by mouth daily. 30 tablet 1  . gabapentin (NEURONTIN) 250 MG/5ML solution Take 2 mLs (100 mg total) by mouth at bedtime. 470 mL 12  . Maltodextrin-Xanthan Gum (RESOURCE THICKENUP CLEAR) POWD Use as needed. 288 g 4  . methadone (DOLOPHINE) 5 MG tablet Take 5 tablets (25 mg total) by mouth every 8 (eight) hours. 30 tablet 0  . [START ON 10/12/2020] pantoprazole sodium (PROTONIX) 40 mg/20 mL PACK Take 20 mLs (40 mg total) by mouth daily at 12 noon. 30 mL 0  . [START ON 10/12/2020] thiamine 100 MG tablet Take 1 tablet (100 mg total) by mouth daily. 30 tablet 1  . [START ON 10/12/2020] vitamin B-12 1000 MCG tablet Take 1 tablet (1,000 mcg total) by mouth daily. 30 tablet 1    Drug Regimen Review { DRUG REGIMEN [id number]}  Home: Home Living Family/patient expects to be discharged to:: Private residence Living Arrangements: Children (Son in the process of moving in with her) Available Help at Discharge: Family,Available 24 hours/day Type of Home: House Home Access: Stairs to enter [employer] of Steps: 5 Entrance Stairs-Rails: Left Home Layout: Two level Alternate Level Stairs-Number of Steps: 14 Alternate Level Stairs-Rails: Right Bathroom Shower/Tub: Chiropodist: Standard Bathroom Accessibility: Yes Home Equipment: Walker - 2 wheels,Crutches,Bedside commode,Shower seat,Grab bars - toilet,Grab bars - tub/shower,Wheelchair - manual,Hospital bed Additional Comments: Unsure how much is accurate, pt is poor historian. She states her son has been living with her, even before her significant other passed away.  Lives With: Son   Functional  History: Prior Function Level of Independence: Independent with assistive device(s) Comments: One moment pt states her son helps her and then the next she says she is independent. Pt states she drives and uses crtuches to ambulate majority of time and sometimes a RW. Unsure of accuracy as pt is poor historian.  Functional Status:  Mobility: Bed Mobility Overal bed mobility: Needs Assistance Bed Mobility: Supine to Sit,Sit to Supine Supine to sit: Supervision Sit to supine: Supervision General bed mobility comments: cueing for safety, supervision for safety Transfers Overall transfer level: Needs assistance Equipment used: 1 person hand held assist Transfers: Sit to/from Stand Sit to Stand: Min assist General transfer comment: min assist to steady from EOB due to impulsivity using one person HHA but recommending RW for longer distances Ambulation/Gait Ambulation/Gait assistance: Min assist,Mod assist Gait Distance (Feet): 200 Feet Assistive device: Rolling walker (2 wheeled),1 person hand held assist Gait Pattern/deviations: Step-through pattern,Decreased step length - right,Decreased step length - left,Decreased stride length,Narrow base of support General Gait Details: Slow and guarded with narrow BOS at times; erratic step width; Pt able to improve posture with cues but not maintaining; tends to reach out for UE support when walking without RW Gait velocity: Decreased  Gait velocity interpretation: <1.8 ft/sec, indicate of risk for recurrent falls    ADL: ADL Overall ADL's : Needs assistance/impaired Eating/Feeding: Supervision/ safety,Set up,Sitting Grooming: Min guard,Wash/dry hands,Standing,Wash/dry face Grooming Details (indicate cue type and reason): at sink, cues to use soap Upper Body Bathing: Minimal assistance,Sitting Lower Body Bathing: Minimal assistance,Sit to/from stand Upper Body Dressing : Minimal assistance,Sitting Lower Body Dressing: Minimal assistance,Sit  to/from stand Toilet Transfer: Minimal assistance,Ambulation,Regular Toilet,Grab bars Toilet Transfer Details (indicate cue type and reason): cueing for safey, pt attempting to get OOB independently therefore OTA providing assistance, pt unsteady on feet requiring one person HHA Toileting- Clothing Manipulation and Hygiene: Minimal assistance,Sit to/from stand Toileting - Clothing Manipulation Details (indicate cue type and reason): clothing mgmt and safety Functional mobility during ADLs: Minimal assistance,Rolling walker,Cueing for safety,Cueing for sequencing General ADL Comments: Pt recieved getting OOB to go to the bathroom, provided one person HHA, significant confusion and impulsivity noted  Cognition: Cognition Overall Cognitive Status: Impaired/Different from baseline Arousal/Alertness: Awake/alert Orientation Level: Oriented to person,Oriented to place,Disoriented to time,Disoriented to situation Attention: Focused,Sustained Focused Attention: Appears intact Sustained Attention: Impaired Sustained Attention Impairment: Verbal basic Memory: Impaired Memory Impairment: Storage deficit,Retrieval deficit,Decreased recall of new information Awareness: Impaired Awareness Impairment: Intellectual impairment,Emergent impairment Problem Solving: Impaired Problem Solving Impairment: Verbal basic,Functional basic Safety/Judgment: Impaired Cognition Arousal/Alertness: Awake/alert Behavior During Therapy: Impulsive,Restless Overall Cognitive Status: Impaired/Different from baseline Area of Impairment: Attention,Memory,Following commands,Safety/judgement,Awareness,Problem solving,Orientation Orientation Level: Disoriented to,Time,Situation Current Attention Level: Sustained Memory: Decreased short-term memory,Decreased recall of precautions Following Commands: Follows one step commands consistently,Follows one step commands with increased time,Follows multi-step commands  inconsistently Safety/Judgement: Decreased awareness of safety,Decreased awareness of deficits Awareness: Intellectual Problem Solving: Slow processing,Difficulty sequencing,Requires verbal cues,Requires tactile cues General Comments: Asking therapist to "turn on her TVs" pointing to remote monitor and call bell monitor on wall   Blood pressure 118/69, pulse 71, temperature 97.9 F (36.6 C), resp. rate 16, weight 52 kg, SpO2 93 %. Physical Exam Vitals and nursing note reviewed.  Constitutional:      Appearance: She is ill-appearing.  Neurological:     Mental Status: She is alert.     Comments: Oriented to self and place. Unable to recall age/dob/month. Perseverative speech at times. Kept eyes closed--briefly opened them--pupils equal. Restless and tried to get out of bed multiple times once activated.   Psychiatric:        Behavior: Behavior is uncooperative.     Results for orders placed or performed during the hospital encounter of 09/30/20 (from the past 48 hour(s))  Hemoglobin A1c     Status: None   Collection Time: 10/10/20  4:06 AM  Result Value Ref Range   Hgb A1c MFr Bld 5.6 4.8 - 5.6 %    Comment: (NOTE) Pre diabetes:          5.7%-6.4%  Diabetes:              >6.4%  Glycemic control for   <7.0% adults with diabetes    Mean Plasma Glucose 114.02 mg/dL    Comment: Performed at [facility] [address], [place], [place] [address]  Lipid panel     Status: Abnormal   Collection Time: 10/10/20  4:06 AM  Result Value Ref Range   Cholesterol 207 (H) 0 - 200 mg/dL   Triglycerides 69 <150 mg/dL   HDL 57 >40 mg/dL   Total CHOL/HDL Ratio 3.6 RATIO   VLDL 14 0 - 40 mg/dL   LDL Cholesterol 136 (H) 0 -  99 mg/dL    Comment:        Total Cholesterol/HDL:CHD Risk Coronary Heart Disease Risk Table                     Men   Women  1/2 Average Risk   3.4   3.3  Average Risk       5.0   4.4  2 X Average Risk   9.6   7.1  3 X Average Risk  23.4   11.0        Use the  calculated Patient Ratio above and the CHD Risk Table to determine the patient's CHD Risk.        ATP III CLASSIFICATION (LDL):  <100     mg/dL   Optimal  100-129  mg/dL   Near or Above                    Optimal  130-159  mg/dL   Borderline  160-189  mg/dL   High  >190     mg/dL   Very High Performed at [facility] [address], [place], [place] [address]   Renal function panel     Status: Abnormal   Collection Time: 10/10/20  4:06 AM  Result Value Ref Range   Sodium 141 135 - 145 mmol/L   Potassium 4.0 3.5 - 5.1 mmol/L   Chloride 100 98 - 111 mmol/L   CO2 32 22 - 32 mmol/L   Glucose, Bld 102 (H) 70 - 99 mg/dL    Comment: Glucose reference range applies only to samples taken after fasting for at least 8 hours.   BUN 16 8 - 23 mg/dL   Creatinine, Ser 1.19 (H) 0.44 - 1.00 mg/dL   Calcium 10.1 8.9 - 10.3 mg/dL   Phosphorus 5.3 (H) 2.5 - 4.6 mg/dL   Albumin 3.4 (L) 3.5 - 5.0 g/dL   GFR, Estimated 51 (L) >60 mL/min    Comment: (NOTE) Calculated using the CKD-EPI Creatinine Equation (2021)    Anion gap 9 5 - 15    Comment: Performed at [facility] [address], [place], [place] [address]   MR ANGIO HEAD WO CONTRAST  Result Date: 10/10/2020 CLINICAL DATA:  Stroke follow-up. EXAM: MRA HEAD WITHOUT CONTRAST TECHNIQUE: Angiographic images of the Circle of Willis were obtained using MRA technique without intravenous contrast. COMPARISON:  None. FINDINGS: The visualized portions of the intracranial vertebral arteries are widely patent to the basilar. Patent left PICA, right AICA, and bilateral SCA origins are identified. The basilar artery is widely patent. Posterior communicating arteries are not clearly identified and may be diminutive or absent. Both PCAs are patent proximally without evidence of a significant P1 or P2 stenosis. There are missing right P3 and more distal branch vessels. The internal carotid arteries are widely patent from skull base to carotid termini.  A1 and M1 segments are widely patent bilaterally. There is occlusion of the left M2 superior division 1 cm distal to the MCA bifurcation. No aneurysm is identified. IMPRESSION: 1. Left M2 superior division occlusion. 2. Multiple missing distal right PCA branch vessels. Electronically Signed   By: Logan Bores M.D.   On: 10/10/2020 17:03   MR ANGIO NECK W WO CONTRAST  Result Date: 10/10/2020 CLINICAL DATA:  Stroke follow-up. EXAM: MRA NECK WITHOUT AND WITH CONTRAST TECHNIQUE: Multiplanar and multiecho pulse sequences of the neck were obtained without and with intravenous contrast. Angiographic images of  the neck were obtained using MRA technique without and with intravenous contrast. CONTRAST:  5.45mL GADAVIST GADOBUTROL 1 MMOL/ML IV SOLN COMPARISON:  None. FINDINGS: There is a normal variant aortic arch branching pattern with common origin of the brachiocephalic and left common carotid arteries. There are mild stenoses of both subclavian arteries proximally. The common carotid and cervical internal carotid arteries are patent bilaterally without evidence of a dissection or significant stenosis. There is mild irregularity of the mid and distal left common carotid artery which may reflect atherosclerosis resulting in less than 50% narrowing. The vertebral arteries are patent and codominant with antegrade flow bilaterally. No dissection or significant vertebral artery stenosis is identified. IMPRESSION: 1. No significant cervical carotid or vertebral artery stenosis. 2. Mild bilateral subclavian artery stenoses. Electronically Signed   By: Logan Bores M.D.   On: 10/10/2020 18:06   ECHOCARDIOGRAM COMPLETE  Result Date: 10/10/2020    ECHOCARDIOGRAM REPORT   Patient Name:   Natalie [name] Date of Exam: 10/10/2020 Medical Rec #:  [id number]   Height:       63.0 in Accession #:    [id number]  Weight:       114.6 lb Date of Birth:  1956-03-07   BSA:          1.526 m Patient Age:    33 years    BP:           124/61 mmHg  Patient Gender: F           HR:           89 bpm. Exam Location:  Inpatient Procedure: 2D Echo, 3D Echo, Cardiac Doppler, Color Doppler and Strain Analysis Indications:    Stroke I63.9  History:        Patient has no prior history of Echocardiogram examinations.                 COPD; Risk Factors:Current Smoker.  Sonographer:    Darlina Sicilian RDCS Referring Phys: [id number] ASHISH ARORA IMPRESSIONS  1. Left ventricular ejection fraction, by estimation, is 55 to 60%. Left ventricular ejection fraction by 3D volume is 56 %. The left ventricle has normal function. The left ventricle has no regional wall motion abnormalities. Left ventricular diastolic  parameters are consistent with Grade I diastolic dysfunction (impaired relaxation). The average left ventricular global longitudinal strain is -23.4 %. The global longitudinal strain is normal.  2. Right ventricular systolic function is normal. The right ventricular size is normal. Tricuspid regurgitation signal is inadequate for assessing PA pressure.  3. The mitral valve is abnormal. Trivial mitral valve regurgitation.  4. The aortic valve is calcified. Aortic valve regurgitation is mild. Mild to moderate aortic valve sclerosis/calcification is present, without any evidence of aortic stenosis.  5. The inferior vena cava is normal in size with greater than 50% respiratory variability, suggesting right atrial pressure of 3 mmHg. Comparison(s): No prior Echocardiogram. Conclusion(s)/Recommendation(s): No intracardiac source of embolism detected on this transthoracic study. A transesophageal echocardiogram is recommended to exclude cardiac source of embolism if clinically indicated. FINDINGS  Left Ventricle: Left ventricular ejection fraction, by estimation, is 55 to 60%. Left ventricular ejection fraction by 3D volume is 56 %. The left ventricle has normal function. The left ventricle has no regional wall motion abnormalities. The average left ventricular global  longitudinal strain is -23.4 %. The global longitudinal strain is normal. The left ventricular internal cavity size was normal in size. There is no left ventricular hypertrophy. Left ventricular diastolic  parameters are consistent  with Grade I diastolic dysfunction (impaired relaxation). Indeterminate filling pressures. Right Ventricle: The right ventricular size is normal. No increase in right ventricular wall thickness. Right ventricular systolic function is normal. Tricuspid regurgitation signal is inadequate for assessing PA pressure. Left Atrium: Left atrial size was normal in size. Right Atrium: Right atrial size was normal in size. Pericardium: There is no evidence of pericardial effusion. Mitral Valve: The mitral valve is abnormal. There is mild thickening of the mitral valve leaflet(s). Mild mitral annular calcification. Trivial mitral valve regurgitation. Tricuspid Valve: The tricuspid valve is grossly normal. Tricuspid valve regurgitation is trivial. Aortic Valve: The aortic valve is calcified. There is mild to moderate aortic valve annular calcification. Aortic valve regurgitation is mild. Aortic regurgitation PHT measures 342 msec. Mild to moderate aortic valve sclerosis/calcification is present, without any evidence of aortic stenosis. Pulmonic Valve: The pulmonic valve was grossly normal. Pulmonic valve regurgitation is trivial. Aorta: The aortic root and ascending aorta are structurally normal, with no evidence of dilitation. Venous: The inferior vena cava is normal in size with greater than 50% respiratory variability, suggesting right atrial pressure of 3 mmHg. IAS/Shunts: No atrial level shunt detected by color flow Doppler.  LEFT VENTRICLE PLAX 2D LVIDd:         4.40 cm         Diastology LVIDs:         3.00 cm         LV e' medial:    6.20 cm/s LV PW:         0.70 cm         LV E/e' medial:  8.7 LV IVS:        0.90 cm         LV e' lateral:   7.29 cm/s LVOT diam:     2.00 cm         LV E/e'  lateral: 7.4 LV SV:         58 LV SV Index:   38              2D LVOT Area:     3.14 cm        Longitudinal                                Strain                                2D Strain GLS  -21.7 %                                (A2C):                                2D Strain GLS  -21.0 %                                (A3C):                                2D Strain GLS  -27.5 %                                (  A4C):                                2D Strain GLS  -23.4 %                                Avg:                                 3D Volume EF                                LV 3D EF:    Left                                             ventricular                                             ejection                                             fraction by                                             3D volume                                             is 56 %.                                 3D Volume EF:                                3D EF:        56 % RIGHT VENTRICLE RV S prime:     16.20 cm/s TAPSE (M-mode): 1.6 cm LEFT ATRIUM             Index       RIGHT ATRIUM          Index LA diam:        3.20 cm 2.10 cm/m  RA Area:     9.62 cm LA Vol (A2C):   21.4 ml 14.02 ml/m RA Volume:   18.50 ml 12.12 ml/m LA Vol (A4C):   17.8 ml 11.66 ml/m LA Biplane Vol: 20.5 ml 13.43 ml/m  AORTIC VALVE LVOT Vmax:   116.00 cm/s LVOT Vmean:  68.100 cm/s LVOT VTI:    0.186 m AI PHT:      342 msec  AORTA Ao Root diam: 2.70 cm MITRAL VALVE MV Area (PHT): 3.08 cm    SHUNTS  MV Decel Time: 246 msec    Systemic VTI:  0.19 m MV E velocity: 54.00 cm/s  Systemic Diam: 2.00 cm MV A velocity: 58.30 cm/s MV E/A ratio:  0.93 Lyman Bishop MD Electronically signed by Lyman Bishop MD Signature Date/Time: 10/10/2020/10:50:09 AM    Final    VAS [place] LOWER EXTREMITY VENOUS (DVT)  Result Date: 10/10/2020  Lower Venous DVT Study Indications: Stroke.  Comparison Study: No prior study Performing Technologist: Maudry Mayhew MHA, RDMS, RVT,  RDCS  Examination Guidelines: A complete evaluation includes B-mode imaging, spectral Doppler, color Doppler, and power Doppler as needed of all accessible portions of each vessel. Bilateral testing is considered an integral part of a complete examination. Limited examinations for reoccurring indications may be performed as noted. The reflux portion of the exam is performed with the patient in reverse Trendelenburg.  +---------+---------------+---------+-----------+----------+--------------+ RIGHT    CompressibilityPhasicitySpontaneityPropertiesThrombus Aging +---------+---------------+---------+-----------+----------+--------------+ CFV      Full           Yes      Yes                                 +---------+---------------+---------+-----------+----------+--------------+ SFJ      Full                                                        +---------+---------------+---------+-----------+----------+--------------+ FV Prox  Full                                                        +---------+---------------+---------+-----------+----------+--------------+ FV Mid   Full                                                        +---------+---------------+---------+-----------+----------+--------------+ FV DistalFull                                                        +---------+---------------+---------+-----------+----------+--------------+ PFV      Full                                                        +---------+---------------+---------+-----------+----------+--------------+ POP      Full           Yes      Yes                                 +---------+---------------+---------+-----------+----------+--------------+ PTV      Full                                                        +---------+---------------+---------+-----------+----------+--------------+  PERO     Full                                                         +---------+---------------+---------+-----------+----------+--------------+   +---------+---------------+---------+-----------+----------+--------------+ LEFT     CompressibilityPhasicitySpontaneityPropertiesThrombus Aging +---------+---------------+---------+-----------+----------+--------------+ CFV      Full           Yes      Yes                                 +---------+---------------+---------+-----------+----------+--------------+ SFJ      Full                                                        +---------+---------------+---------+-----------+----------+--------------+ FV Prox  Full                                                        +---------+---------------+---------+-----------+----------+--------------+ FV Mid   Full                                                        +---------+---------------+---------+-----------+----------+--------------+ FV DistalFull                                                        +---------+---------------+---------+-----------+----------+--------------+ PFV      Full                                                        +---------+---------------+---------+-----------+----------+--------------+ POP      Full           Yes      Yes                                 +---------+---------------+---------+-----------+----------+--------------+ PTV      Full                                                        +---------+---------------+---------+-----------+----------+--------------+ PERO     Full                                                        +---------+---------------+---------+-----------+----------+--------------+  Summary: RIGHT: - There is no evidence of deep vein thrombosis in the lower extremity.  - No cystic structure found in the popliteal fossa.  LEFT: - There is no evidence of deep vein thrombosis in the lower extremity.  - No cystic structure found in the popliteal fossa.   *See table(s) above for measurements and observations. Electronically signed by Harold Barban MD on 10/10/2020 at 49:57:39 PM.    Final        Medical Problem List and Plan: 1.  *** secondary to ***  -patient may *** shower  -ELOS/Goals: *** 2.  Antithrombotics: -DVT/anticoagulation:  Pharmaceutical: Lovenox  -antiplatelet therapy: Plavix 3. Chronic pain/Pain Management: Methadone 25 mg tid with gabapentin at bedtime.  4. Mood: LCSW to follow for evaluation and support.   -antipsychotic agents: N/A 5. Neuropsych: This patient is not capable of making decisions on her own behalf. 6. Skin/Wound Care: Routine pressure relief measures.  7. Fluids/Electrolytes/Nutrition: Monitor I/O. Check lytes in am.  8. Vitamin B12 deficiency: Continue po supplement.  9. Thiamine deficiency:  Continue po thiamine daily.  10. CKD: SCr 1.2 at baseline. Will monitor BMET serially. 11. COPD: Was using oxygen 3L per [place] in addition to nebs (Per family/record review)  Will resume at 1 liter and titrate as needed.  Nebs prn.  12. Encephalopathy: Will order enclosure bed due to ongoing confusion and fall risk.    ***  Bary Leriche, PA-C 10/11/2020

## 2020-10-10 NOTE — Progress Notes (Signed)
Bilateral lower extremity venous duplex completed. Refer to "CV Proc" under chart review to view preliminary results.  10/10/2020 2:23 PM Kelby Aline., MHA, RVT, RDCS, RDMS

## 2020-10-10 NOTE — Progress Notes (Signed)
  Speech Language Pathology Treatment:    Patient Details Name: Natalie [name] MRN: [id number] DOB: 03-Jan-1956 Today's Date: 10/10/2020 Time: 0900-0920 SLP Time Calculation (min) (ACUTE ONLY): 20 min  Assessment / Plan / Recommendation Clinical Impression  Pt demonstrates mild improvement in awareness of perseveration errors today. She was able to state that she know knows she had a stroke and SLP provided explanation and intellectual awareness cues of aphasic errors. When naming colors, pt was able to identify error in 75% of trials but correct error in less than 25% of trials. She greatly benefited from semantic cues in this task. In a picture naming task, perseveration and semantic paraphasias were more severe given a more open set. Pt needed increased cueing, typically a closed phrase from a song or rhyme to correct errors. Emergent awareness very poor with increased word complexity.  Pt now tolerating thin liquids well, but dentures are not present. Pt and I called her son to request these so pt can potentially upgrade her solids. FOr now, will upgrade to thin liquids.   HPI HPI: 65 year old female with COPD and CKD stage IIIa who was referred from [facility] for evaluation of altered mental status, CT and MRi negative for acute change, LP suggesting of infection, started empirically on acyclovir. Then concern for seizure. Pt intubated from 3/27-3/30.      SLP Plan  Continue with current plan of care       Recommendations  Diet recommendations: Dysphagia 1 (puree);Thin liquid Liquids provided via: Cup Medication Administration: Whole meds with puree Supervision: Patient able to self feed Compensations: Slow rate;Small sips/bites Postural Changes and/or Swallow Maneuvers: Seated upright 90 degrees                Oral Care Recommendations: Oral care BID Follow up Recommendations: Inpatient Rehab Plan: Continue with current plan of care       GO                Shauntay Brunelli, Katherene Ponto 10/10/2020, 9:54 AM

## 2020-10-10 NOTE — Progress Notes (Signed)
  Echocardiogram 2D Echocardiogram with 3D and strain has been performed.  Natalie [name] 10/10/2020, 10:33 AM

## 2020-10-10 NOTE — Progress Notes (Addendum)
STROKE TEAM PROGRESS NOTE   INTERVAL HISTORY Patient received MRI as primary team has been concerned for encephalopathy with symptoms of echolalia and past pointing. MRI 10/09/2020 noted Acute 6 cm anterior L MCA stroke, suggesting the stroke occurred while patient was in the hospital as this was on the 8th day of hospitalization. Patient had received MRI 3/26 and this was not noted.  On exam today patient reports that she recalls why she came to hospital, but is not able to explain why neurology was recently consulted. When prompted patient does not seem aware that she has been displaying bizarre behavior in the hospital and does not recall any symptoms of neuro deficit that would have occurred while in the hospital. Patient reports that she feels "fine" but she has been noticing some "not normal things" and proceeds to talk about how the cars she sees outside of her window are not normal.  Patient reports that she also has her son coming to bring her thermometer and is confused when it is explained that this is not necessary. Patient also was provided a coloring book by the staff but is worried to use the crayons " I want to keep them as long as possible" so she prefers to look through the book.  Vitals:   10/09/20 2328 10/10/20 0404 10/10/20 0753 10/10/20 0755  BP: (!) 155/77 136/71 124/61   Pulse: 64 72    Resp: 19 16 12    Temp: 97.9 F (36.6 C) 98.6 F (37 C) 98.2 F (36.8 C)   TempSrc: Oral Oral Oral   SpO2: 93% 98%  93%  Weight:  52 kg     CBC:  Recent Labs  Lab 10/04/20 0606 10/07/20 0123  WBC 7.2 5.7  NEUTROABS  --  4.0  HGB 12.2 12.9  HCT 37.5 40.3  MCV 94.2 96.6  PLT 182 032   Basic Metabolic Panel:  Recent Labs  Lab 10/09/20 0705 10/10/20 0406  NA 139 141  K 3.7 4.0  CL 102 100  CO2 30 32  GLUCOSE 102* 102*  BUN 10 16  CREATININE 0.98 1.19*  CALCIUM 9.6 10.1  PHOS 4.5 5.3*   Lipid Panel:  Recent Labs  Lab 10/10/20 0406  CHOL 207*  TRIG 69  HDL 57  CHOLHDL  3.6  VLDL 14  LDLCALC 136*   HgbA1c:  Recent Labs  Lab 10/10/20 0406  HGBA1C 5.6   Urine Drug Screen: No results for input(s): LABOPIA, COCAINSCRNUR, LABBENZ, AMPHETMU, THCU, LABBARB in the last 168 hours.  Alcohol Level No results for input(s): ETH in the last 168 hours.  IMAGING past 24 hours ECHOCARDIOGRAM COMPLETE  Result Date: 10/10/2020    ECHOCARDIOGRAM REPORT   Patient Name:   Natalie [name] Date of Exam: 10/10/2020 Medical Rec #:  [id number]   Height:       63.0 in Accession #:    [id number]  Weight:       114.6 lb Date of Birth:  1956-06-22   BSA:          1.526 m Patient Age:    65 years    BP:           124/61 mmHg Patient Gender: F           HR:           89 bpm. Exam Location:  Inpatient Procedure: 2D Echo, 3D Echo, Cardiac Doppler, Color Doppler and Strain Analysis Indications:    Stroke I63.9  History:  Patient has no prior history of Echocardiogram examinations.                 COPD; Risk Factors:Current Smoker.  Sonographer:    Darlina Sicilian RDCS Referring Phys: [id number] ASHISH ARORA IMPRESSIONS  1. Left ventricular ejection fraction, by estimation, is 55 to 60%. Left ventricular ejection fraction by 3D volume is 56 %. The left ventricle has normal function. The left ventricle has no regional wall motion abnormalities. Left ventricular diastolic  parameters are consistent with Grade I diastolic dysfunction (impaired relaxation). The average left ventricular global longitudinal strain is -23.4 %. The global longitudinal strain is normal.  2. Right ventricular systolic function is normal. The right ventricular size is normal. Tricuspid regurgitation signal is inadequate for assessing PA pressure.  3. The mitral valve is abnormal. Trivial mitral valve regurgitation.  4. The aortic valve is calcified. Aortic valve regurgitation is mild. Mild to moderate aortic valve sclerosis/calcification is present, without any evidence of aortic stenosis.  5. The inferior vena cava is normal in size  with greater than 50% respiratory variability, suggesting right atrial pressure of 3 mmHg. Comparison(s): No prior Echocardiogram. Conclusion(s)/Recommendation(s): No intracardiac source of embolism detected on this transthoracic study. A transesophageal echocardiogram is recommended to exclude cardiac source of embolism if clinically indicated. FINDINGS  Left Ventricle: Left ventricular ejection fraction, by estimation, is 55 to 60%. Left ventricular ejection fraction by 3D volume is 56 %. The left ventricle has normal function. The left ventricle has no regional wall motion abnormalities. The average left ventricular global longitudinal strain is -23.4 %. The global longitudinal strain is normal. The left ventricular internal cavity size was normal in size. There is no left ventricular hypertrophy. Left ventricular diastolic parameters are consistent  with Grade I diastolic dysfunction (impaired relaxation). Indeterminate filling pressures. Right Ventricle: The right ventricular size is normal. No increase in right ventricular wall thickness. Right ventricular systolic function is normal. Tricuspid regurgitation signal is inadequate for assessing PA pressure. Left Atrium: Left atrial size was normal in size. Right Atrium: Right atrial size was normal in size. Pericardium: There is no evidence of pericardial effusion. Mitral Valve: The mitral valve is abnormal. There is mild thickening of the mitral valve leaflet(s). Mild mitral annular calcification. Trivial mitral valve regurgitation. Tricuspid Valve: The tricuspid valve is grossly normal. Tricuspid valve regurgitation is trivial. Aortic Valve: The aortic valve is calcified. There is mild to moderate aortic valve annular calcification. Aortic valve regurgitation is mild. Aortic regurgitation PHT measures 342 msec. Mild to moderate aortic valve sclerosis/calcification is present, without any evidence of aortic stenosis. Pulmonic Valve: The pulmonic valve was  grossly normal. Pulmonic valve regurgitation is trivial. Aorta: The aortic root and ascending aorta are structurally normal, with no evidence of dilitation. Venous: The inferior vena cava is normal in size with greater than 50% respiratory variability, suggesting right atrial pressure of 3 mmHg. IAS/Shunts: No atrial level shunt detected by color flow Doppler.  LEFT VENTRICLE PLAX 2D LVIDd:         4.40 cm         Diastology LVIDs:         3.00 cm         LV e' medial:    6.20 cm/s LV PW:         0.70 cm         LV E/e' medial:  8.7 LV IVS:        0.90 cm  LV e' lateral:   7.29 cm/s LVOT diam:     2.00 cm         LV E/e' lateral: 7.4 LV SV:         58 LV SV Index:   38              2D LVOT Area:     3.14 cm        Longitudinal                                Strain                                2D Strain GLS  -21.7 %                                (A2C):                                2D Strain GLS  -21.0 %                                (A3C):                                2D Strain GLS  -27.5 %                                (A4C):                                2D Strain GLS  -23.4 %                                Avg:                                 3D Volume EF                                LV 3D EF:    Left                                             ventricular                                             ejection                                             fraction by  3D volume                                             is 56 %.                                 3D Volume EF:                                3D EF:        56 % RIGHT VENTRICLE RV S prime:     16.20 cm/s TAPSE (M-mode): 1.6 cm LEFT ATRIUM             Index       RIGHT ATRIUM          Index LA diam:        3.20 cm 2.10 cm/m  RA Area:     9.62 cm LA Vol (A2C):   21.4 ml 14.02 ml/m RA Volume:   18.50 ml 12.12 ml/m LA Vol (A4C):   17.8 ml 11.66 ml/m LA Biplane Vol: 20.5 ml 13.43 ml/m  AORTIC  VALVE LVOT Vmax:   116.00 cm/s LVOT Vmean:  68.100 cm/s LVOT VTI:    0.186 m AI PHT:      342 msec  AORTA Ao Root diam: 2.70 cm MITRAL VALVE MV Area (PHT): 3.08 cm    SHUNTS MV Decel Time: 246 msec    Systemic VTI:  0.19 m MV E velocity: 54.00 cm/s  Systemic Diam: 2.00 cm MV A velocity: 58.30 cm/s MV E/A ratio:  0.93 Lyman Bishop MD Electronically signed by Lyman Bishop MD Signature Date/Time: 10/10/2020/10:50:09 AM    Final     PHYSICAL EXAM General: Laying comfortably in bed during echo and then sits up in bed after almost in an upright fetal position. HENT: Normal oropharynx and mucosa. Normal external appearance of ears and nose.  Neck: Supple, no pain or tenderness  CV: No JVD. No peripheral edema.  Pulmonary: Symmetric Chest rise. Normal respiratory effort.  Abdomen: Soft to touch, non-tender.  Ext: No cyanosis, edema, or deformity  Skin: No rash. Normal palpation of skin.   Musculoskeletal: Normal digits and nails by inspection. No clubbing.   Neurologic Examination  Mental status/Cognition: Alert, oriented to self, and year. Still has significant confusion, patient is not oriented to month or day ( I don't believe what it says on the board) not oriented to city but is aware she is in a hospital. Patient reports she does not believe the board because some nurses have told her it is not correct (but the board is correct and even when writer tells patient this she seems shocked an not fully convinced). Patient appeared to confabulate at times when she could not think of why she said what she said, such as when patient admitted "some things are normal and some things are not". Patient was not able to spell WORLD backwards, but could spell it forward.   Speech/language:Fluent and repetition intact. Patient has some trouble with naming, but was able to get 4/5 things correctly but could not recall the word "pinky" when asked to name the finger being held up. Patient also had some delayed  comprehension and had to have things explained multiple times  before she appeared to somewhat understand.  Cranial nerves: CN II Pupils equal and reactive to light  CN III,IV,VI EOM intact, no gaze preference or deviation, no nystagmus . 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  CN V normal sensation in V1, V2, and V3 segments bilaterally   CN VII no asymmetry, no nasolabial fold flattening   CN VIII normal hearing to speech   CN IX &X normal palatal elevation, no uvular deviation   CN XI 5/5 head turn and 5/5 shoulder shrug bilaterally   CN XII Mild deviation to the right    Motor: 4/5 in RUE 5/5 LUE 5/5 BLE. Sensation: Intact to light touch symmetrically in all extremities Coordination/Complex Motor:  - Finger to Noseintact bilaterally - Gait:deferred   ASSESSMENT/PLAN [name] is a 65 y.o. female with history of Cataracts, bilateral, Chickenpox (cataracts), COPD (chronic obstructive pulmonary disease) ([place]), Depression, Drug addiction ([place]), Headache, Hepatitis C virus infection, Hepatitis C without hepatic coma, History of chicken pox, History of colon polyps, and Seizures ([place]) who presented with altered mental statusonday of admission. Patient initial MRI was negative for acute intracranial abnormality; however repeat 10/09/2020 noted an acute infarct of the L MCA. Patient currently undergoing stroke workup (etiology likely small vessel disease as chronic lacunar infarcts were also noted); however physical exam is not extremely concerning and the mild R sided weakness can be attributed to this infarct. Do not recommend bubble study as patient would be a poor candidate for PFO repair therefore this would not change management.  Patient's exam is more concerning for apparent persistent cognitive decline. Patient continues to struggle with tasks that require higher/ executive functioning and reasoning. At this time patient appears to confabulate a bit in an attempt to try  to cover, but patient MMSE  (12/20) yesterday was suggestive of severe cognitive impairment.   Stroke: Anterior Left MCA Infarct, embolic pattern, stroke work up underway  MRI brain left anterior MCA infarct  MRA head and neck pending  LE venous Doppler pending  Recommend 30 day cardioembolic monitoring if MRA and LE venous doppler are negative  2D Echo : LVEF: 55 to 60%  LDL 136  HgbA1c 5.6  UDS + methadone (home meds)  VTE prophylaxis -  SCDs  No antithrombotic prior to admission, now on clopidogrel 75 mg daily. Allergy to ASA.  Therapy recommendations:  CIR recommended  Disposition:  Pending   Cognitive impairment  VitB12 deficiency  MMSE 12/30 per Dr. Rory Percy 10/08/2020  Lack of insight, disorientation, confabulation   B12 = 85, MMA elevated  On B12 supplement  Recommend outpt neuropsych evaluation   Hyperlipidemia  LDL 136, goal < 70  Atorvastatin 40mg   Continue statin at discharge  Tobacco abuse  Current smoker  Smoking cessation counseling provided  Pt is willing to quit  Other Stroke Risk Factors  Migraines  ? Alcohol use, on FA/B1/MVI  Other Active Problems  Chronic pain - on methadone  Elevated TSH but normal FT4 and FT3  ? Hx of seizure  Hospital day # 10  Damita Dunnings, MD PGY-1  ATTENDING NOTE: I reviewed above note and agree with the assessment and plan. Pt was seen and examined.   65 year old female with history of bilateral cataracts, COPD, smoker, substance abuse, headache, hepatitis C, chickenpox, ?seizure admitted for altered mental status on 09/28/2020.  Initial CT and MRI brain showed no acute infarct but severe leukoariosis.  However, patient continued to have altered mental status, confusion and disorientation.  Repeat  MRI 10/09/2020 showed new left anterior MCA infarct.  Neurology was reconsulted.  MRI head and neck pending, EF 55 to 60%, LE venous Doppler pending.  LDL 136, A1c 5.6.  B12 =85, MMA elevated, TSH 7.203 but  normal free T4 and free T3.  Creatinine 1.19.  On exam, patient lack of insight for her condition, orientated to self and age, however not to time or place.  Had MMSE testing by Dr. Malen Gauze yesterday which was 12/30.  Otherwise, no focal neurologic deficit.  No cranial nerve deficit, moving all extremities, sensation subjectively symmetrical, finger-to-nose intact.  Etiology for patient stroke not quite clear, however, it does show embolic pattern.  Will follow up with MRA head and neck and LE venous Doppler.  If all negative, will consider 30-day Cardiac event monitoring to rule out A. fib.  Patient does have cognitive impairment with low MMSE and lack of insight for her condition.  B12 deficiency on supplement.  Recommend outpatient neuropsych evaluation.  Smoking cessation education provided.  For detailed assessment and plan, please refer to above as I have made changes wherever appropriate.   Rosalin Hawking, MD PhD Stroke Neurology 10/10/2020 4:26 PM  To contact Stroke Continuity provider, please refer to [email or web address] After hours, contact General Neurology

## 2020-10-10 NOTE — Progress Notes (Signed)
MRI rep spoke w/ at 2300 pertaining to pt having new pre-MRI meds. Rep stated that they would contact staff once there is a slot for pt to come back down to have MRI completed.

## 2020-10-11 ENCOUNTER — Other Ambulatory Visit: Payer: Self-pay

## 2020-10-11 ENCOUNTER — Inpatient Hospital Stay (HOSPITAL_COMMUNITY)
Admission: RE | Admit: 2020-10-11 | Discharge: 2020-10-19 | DRG: 056 | Disposition: A | Discharge status: Home Health | Payer: BC Managed Care – PPO | Source: Intra-hospital | Attending: Physical Medicine & Rehabilitation | Admitting: Physical Medicine & Rehabilitation

## 2020-10-11 ENCOUNTER — Encounter (HOSPITAL_COMMUNITY): Payer: Self-pay | Admitting: Physical Medicine & Rehabilitation

## 2020-10-11 DIAGNOSIS — R569 Unspecified convulsions: Secondary | ICD-10-CM | POA: Diagnosis present

## 2020-10-11 DIAGNOSIS — E8809 Other disorders of plasma-protein metabolism, not elsewhere classified: Secondary | ICD-10-CM | POA: Diagnosis not present

## 2020-10-11 DIAGNOSIS — R2689 Other abnormalities of gait and mobility: Secondary | ICD-10-CM | POA: Diagnosis present

## 2020-10-11 DIAGNOSIS — G894 Chronic pain syndrome: Secondary | ICD-10-CM

## 2020-10-11 DIAGNOSIS — N1832 Chronic kidney disease, stage 3b: Secondary | ICD-10-CM | POA: Diagnosis present

## 2020-10-11 DIAGNOSIS — I69398 Other sequelae of cerebral infarction: Secondary | ICD-10-CM

## 2020-10-11 DIAGNOSIS — G9340 Encephalopathy, unspecified: Secondary | ICD-10-CM | POA: Diagnosis not present

## 2020-10-11 DIAGNOSIS — G9341 Metabolic encephalopathy: Secondary | ICD-10-CM | POA: Diagnosis not present

## 2020-10-11 DIAGNOSIS — Z882 Allergy status to sulfonamides status: Secondary | ICD-10-CM

## 2020-10-11 DIAGNOSIS — K5900 Constipation, unspecified: Secondary | ICD-10-CM | POA: Diagnosis present

## 2020-10-11 DIAGNOSIS — Z9981 Dependence on supplemental oxygen: Secondary | ICD-10-CM | POA: Diagnosis not present

## 2020-10-11 DIAGNOSIS — I69319 Unspecified symptoms and signs involving cognitive functions following cerebral infarction: Secondary | ICD-10-CM | POA: Diagnosis not present

## 2020-10-11 DIAGNOSIS — R41844 Frontal lobe and executive function deficit: Secondary | ICD-10-CM

## 2020-10-11 DIAGNOSIS — I63512 Cerebral infarction due to unspecified occlusion or stenosis of left middle cerebral artery: Secondary | ICD-10-CM | POA: Diagnosis present

## 2020-10-11 DIAGNOSIS — E519 Thiamine deficiency, unspecified: Secondary | ICD-10-CM | POA: Diagnosis present

## 2020-10-11 DIAGNOSIS — J449 Chronic obstructive pulmonary disease, unspecified: Secondary | ICD-10-CM | POA: Diagnosis present

## 2020-10-11 DIAGNOSIS — I69391 Dysphagia following cerebral infarction: Secondary | ICD-10-CM | POA: Diagnosis not present

## 2020-10-11 DIAGNOSIS — I69320 Aphasia following cerebral infarction: Secondary | ICD-10-CM

## 2020-10-11 DIAGNOSIS — Z886 Allergy status to analgesic agent status: Secondary | ICD-10-CM

## 2020-10-11 DIAGNOSIS — E46 Unspecified protein-calorie malnutrition: Secondary | ICD-10-CM | POA: Diagnosis present

## 2020-10-11 DIAGNOSIS — R4701 Aphasia: Secondary | ICD-10-CM | POA: Diagnosis present

## 2020-10-11 DIAGNOSIS — Z681 Body mass index (BMI) 19 or less, adult: Secondary | ICD-10-CM

## 2020-10-11 DIAGNOSIS — I6330 Cerebral infarction due to thrombosis of unspecified cerebral artery: Secondary | ICD-10-CM

## 2020-10-11 DIAGNOSIS — E538 Deficiency of other specified B group vitamins: Secondary | ICD-10-CM | POA: Diagnosis present

## 2020-10-11 DIAGNOSIS — Z79891 Long term (current) use of opiate analgesic: Secondary | ICD-10-CM

## 2020-10-11 DIAGNOSIS — N183 Chronic kidney disease, stage 3 unspecified: Secondary | ICD-10-CM | POA: Diagnosis present

## 2020-10-11 DIAGNOSIS — Z880 Allergy status to penicillin: Secondary | ICD-10-CM | POA: Diagnosis not present

## 2020-10-11 DIAGNOSIS — Z803 Family history of malignant neoplasm of breast: Secondary | ICD-10-CM

## 2020-10-11 DIAGNOSIS — I69314 Frontal lobe and executive function deficit following cerebral infarction: Secondary | ICD-10-CM | POA: Diagnosis not present

## 2020-10-11 DIAGNOSIS — Z87891 Personal history of nicotine dependence: Secondary | ICD-10-CM

## 2020-10-11 DIAGNOSIS — N1831 Chronic kidney disease, stage 3a: Secondary | ICD-10-CM | POA: Diagnosis not present

## 2020-10-11 DIAGNOSIS — I639 Cerebral infarction, unspecified: Secondary | ICD-10-CM | POA: Diagnosis present

## 2020-10-11 DIAGNOSIS — B1920 Unspecified viral hepatitis C without hepatic coma: Secondary | ICD-10-CM | POA: Diagnosis present

## 2020-10-11 DIAGNOSIS — I69351 Hemiplegia and hemiparesis following cerebral infarction affecting right dominant side: Principal | ICD-10-CM

## 2020-10-11 DIAGNOSIS — Z881 Allergy status to other antibiotic agents status: Secondary | ICD-10-CM

## 2020-10-11 DIAGNOSIS — Z888 Allergy status to other drugs, medicaments and biological substances status: Secondary | ICD-10-CM

## 2020-10-11 DIAGNOSIS — Z885 Allergy status to narcotic agent status: Secondary | ICD-10-CM

## 2020-10-11 LAB — BLOOD GAS, ARTERIAL
Acid-Base Excess: 2.8 mmol/L — ABNORMAL HIGH (ref 0.0–2.0)
Acid-base deficit: 0.4 mmol/L (ref 0.0–2.0)
Bicarbonate: 24.8 mmol/L (ref 20.0–28.0)
Bicarbonate: 26.1 mmol/L (ref 20.0–28.0)
FIO2: 1
FIO2: 1
MECHVT: 450 mL
MECHVT: 450 mL
O2 Saturation: 100 %
O2 Saturation: 88.1 %
PEEP: 5 cmH2O
PEEP: 5 cmH2O
Patient temperature: 37
Patient temperature: 37
RATE: 15 resp/min
RATE: 15 resp/min
pCO2 arterial: 35 mmHg (ref 32.0–48.0)
pCO2 arterial: 42 mmHg (ref 32.0–48.0)
pH, Arterial: 7.38 (ref 7.350–7.450)
pH, Arterial: 7.48 — ABNORMAL HIGH (ref 7.350–7.450)
pO2, Arterial: 422 mmHg — ABNORMAL HIGH (ref 83.0–108.0)
pO2, Arterial: 56 mmHg — ABNORMAL LOW (ref 83.0–108.0)

## 2020-10-11 MED ORDER — GABAPENTIN 250 MG/5ML PO SOLN: 100.0000 mg | Freq: Every day | ORAL | 12 refills | Status: DC

## 2020-10-11 MED ORDER — DIPHENHYDRAMINE HCL 12.5 MG/5ML PO ELIX
12.5000 mg | ORAL_SOLUTION | Freq: Four times a day (QID) | ORAL | Status: DC | PRN
  Administered 2020-10-12: 12.5 mg via ORAL
  Filled 2020-10-11: qty 10

## 2020-10-11 MED ORDER — RESOURCE THICKENUP CLEAR PO POWD
ORAL | Status: DC | PRN
  Filled 2020-10-11: qty 125

## 2020-10-11 MED ORDER — FOLIC ACID 1 MG PO TABS
1.0000 mg | ORAL_TABLET | Freq: Every day | ORAL | Status: DC
  Administered 2020-10-12 – 2020-10-19 (×8): 1 mg via ORAL
  Filled 2020-10-11 (×8): qty 1

## 2020-10-11 MED ORDER — ATORVASTATIN CALCIUM 40 MG PO TABS
40.0000 mg | ORAL_TABLET | Freq: Every day | ORAL | Status: DC
  Administered 2020-10-12 – 2020-10-19 (×8): 40 mg via ORAL
  Filled 2020-10-11 (×8): qty 1

## 2020-10-11 MED ORDER — CLOPIDOGREL BISULFATE 75 MG PO TABS
75.0000 mg | ORAL_TABLET | Freq: Every day | ORAL | Status: DC
  Administered 2020-10-12 – 2020-10-19 (×8): 75 mg via ORAL
  Filled 2020-10-11 (×8): qty 1

## 2020-10-11 MED ORDER — CYANOCOBALAMIN 1000 MCG PO TABS: 1000.0000 ug | ORAL_TABLET | Freq: Every day | ORAL | 1 refills | Status: DC

## 2020-10-11 MED ORDER — POLYETHYLENE GLYCOL 3350 17 G PO PACK
17.0000 g | PACK | Freq: Every day | ORAL | Status: DC | PRN
  Administered 2020-10-13: 17 g via ORAL
  Filled 2020-10-11: qty 1

## 2020-10-11 MED ORDER — PROCHLORPERAZINE 25 MG RE SUPP: 12.5000 mg | Freq: Four times a day (QID) | RECTAL | Status: DC | PRN

## 2020-10-11 MED ORDER — TRAZODONE HCL 50 MG PO TABS: 25.0000 mg | ORAL_TABLET | Freq: Every evening | ORAL | Status: DC | PRN

## 2020-10-11 MED ORDER — ACETAMINOPHEN 325 MG PO TABS
325.0000 mg | ORAL_TABLET | ORAL | Status: DC | PRN
  Administered 2020-10-12: 650 mg via ORAL
  Filled 2020-10-11 (×2): qty 2

## 2020-10-11 MED ORDER — ENOXAPARIN SODIUM 40 MG/0.4ML ~~LOC~~ SOLN
40.0000 mg | SUBCUTANEOUS | Status: DC
Start: 2020-10-11 — End: 2020-10-15
  Administered 2020-10-11 – 2020-10-14 (×4): 40 mg via SUBCUTANEOUS
  Filled 2020-10-11 (×4): qty 0.4

## 2020-10-11 MED ORDER — ALUM & MAG HYDROXIDE-SIMETH 200-200-20 MG/5ML PO SUSP: 30.0000 mL | ORAL | Status: DC | PRN

## 2020-10-11 MED ORDER — METHADONE HCL 10 MG PO TABS
25.0000 mg | ORAL_TABLET | Freq: Three times a day (TID) | ORAL | Status: DC
  Administered 2020-10-11 – 2020-10-14 (×8): 25 mg via ORAL
  Filled 2020-10-11 (×3): qty 3
  Filled 2020-10-11 (×2): qty 2
  Filled 2020-10-11: qty 3
  Filled 2020-10-11: qty 2
  Filled 2020-10-11: qty 3

## 2020-10-11 MED ORDER — METHADONE HCL 5 MG PO TABS: 25.0000 mg | ORAL_TABLET | Freq: Three times a day (TID) | ORAL | 0 refills | Status: DC

## 2020-10-11 MED ORDER — THIAMINE HCL 100 MG PO TABS
100.0000 mg | ORAL_TABLET | Freq: Every day | ORAL | Status: DC
  Administered 2020-10-11: 100 mg via ORAL
  Filled 2020-10-11: qty 1

## 2020-10-11 MED ORDER — PANTOPRAZOLE SODIUM 40 MG PO PACK: 40.0000 mg | PACK | Freq: Every day | ORAL | 0 refills | Status: DC

## 2020-10-11 MED ORDER — VITAMIN B-12 1000 MCG PO TABS
1000.0000 ug | ORAL_TABLET | Freq: Every day | ORAL | Status: DC
  Administered 2020-10-12 – 2020-10-19 (×8): 1000 ug via ORAL
  Filled 2020-10-11 (×8): qty 1

## 2020-10-11 MED ORDER — PROCHLORPERAZINE EDISYLATE 10 MG/2ML IJ SOLN: 5.0000 mg | Freq: Four times a day (QID) | INTRAMUSCULAR | Status: DC | PRN

## 2020-10-11 MED ORDER — PANTOPRAZOLE SODIUM 40 MG PO PACK
40.0000 mg | PACK | Freq: Every day | ORAL | Status: DC
  Administered 2020-10-12 – 2020-10-19 (×7): 40 mg via ORAL
  Filled 2020-10-11 (×7): qty 20

## 2020-10-11 MED ORDER — CLOPIDOGREL BISULFATE 75 MG PO TABS: 75.0000 mg | ORAL_TABLET | Freq: Every day | ORAL | 1 refills | Status: DC

## 2020-10-11 MED ORDER — BISACODYL 10 MG RE SUPP
10.0000 mg | Freq: Every day | RECTAL | Status: DC | PRN
  Filled 2020-10-11: qty 1

## 2020-10-11 MED ORDER — IPRATROPIUM-ALBUTEROL 0.5-2.5 (3) MG/3ML IN SOLN: 3.0000 mL | RESPIRATORY_TRACT | Status: DC | PRN

## 2020-10-11 MED ORDER — THIAMINE HCL 100 MG/ML IJ SOLN: 100.0000 mg | Freq: Every day | INTRAMUSCULAR | Status: DC

## 2020-10-11 MED ORDER — GUAIFENESIN-DM 100-10 MG/5ML PO SYRP: 5.0000 mL | ORAL_SOLUTION | Freq: Four times a day (QID) | ORAL | Status: DC | PRN

## 2020-10-11 MED ORDER — THIAMINE HCL 100 MG PO TABS: 100.0000 mg | ORAL_TABLET | Freq: Every day | ORAL | 1 refills | Status: DC

## 2020-10-11 MED ORDER — FOLIC ACID 1 MG PO TABS: 1.0000 mg | ORAL_TABLET | Freq: Every day | ORAL | 1 refills | Status: DC

## 2020-10-11 MED ORDER — ATORVASTATIN CALCIUM 40 MG PO TABS: 40.0000 mg | ORAL_TABLET | Freq: Every day | ORAL | 1 refills | Status: DC

## 2020-10-11 MED ORDER — RESOURCE THICKENUP CLEAR PO POWD: ORAL | 4 refills | Status: DC

## 2020-10-11 MED ORDER — PROCHLORPERAZINE MALEATE 5 MG PO TABS: 5.0000 mg | ORAL_TABLET | Freq: Four times a day (QID) | ORAL | Status: DC | PRN

## 2020-10-11 MED ORDER — GABAPENTIN 250 MG/5ML PO SOLN
100.0000 mg | Freq: Every day | ORAL | Status: DC
  Administered 2020-10-11 – 2020-10-17 (×7): 100 mg via ORAL
  Filled 2020-10-11 (×7): qty 2

## 2020-10-11 MED ORDER — THIAMINE HCL 100 MG PO TABS
100.0000 mg | ORAL_TABLET | Freq: Every day | ORAL | Status: DC
  Administered 2020-10-12 – 2020-10-19 (×8): 100 mg via ORAL
  Filled 2020-10-11 (×8): qty 1

## 2020-10-11 MED ORDER — FLEET ENEMA 7-19 GM/118ML RE ENEM: 1.0000 | ENEMA | Freq: Once | RECTAL | Status: DC | PRN

## 2020-10-11 NOTE — Progress Notes (Signed)
Courtney Heys, MD  Physician  Physical Medicine and Rehabilitation  PMR Pre-admission     Signed  Date of Service:  10/09/2020 6:31 PM      Related encounter: Admission (Discharged) from 09/30/2020 in [facility]       Signed          Show:Clear all _0 Manual_1 Template_2 Copied  Added by: _3 Lind Covert, Runell Gess, CCC-SLP_4 Retta Diones, RN_5 Lovorn, Megan, MD_6 Michel Santee, PT   _7 Hover for details  PMR Admission Coordinator Pre-Admission Assessment  [name] is an 65 y.o., female MRN: [id number] DOB: 07-08-1955 Height:   Weight: 52 kg  Insurance Information HMO:     PPO: yes     PCP:      IPA:      80/20:      OTHER:  PRIMARY: BCBS       Policy#: [id number] Subscriber: patient CM Name:       Phone#:      Fax#: [phone] Pre-Cert#: [id number]  auth good for 7 days from 4/6 with updates due to fax listed above.  Employer:  Benefits:  Phone #:[phone]     Name: Linton Ham. Date: 07/08/19-still active     Deduct: $800 ($800 met)      Out of Pocket Max: $4,500 ($1,048.97 met)      Life Max: NA CIR: 80% coverage, 20% co-insurance     SNF: 80% coverage, 20% co-insurance; limited to 200 days/year Outpatient: 80% coverage; limited to 20 visits per discipline/year     Co-Pay: 20% Home Health: 80% coverage;limited by medical necessity      Co-Pay: 20% DME: 80% coverage     Co-Pay: 20% Providers: in-network  SECONDARY: Medicaid [place] - verified 10/11/20 with coverage code [facility] Policy#: [id number]     Phone#: [phone]  Financial Counselor:       Phone#:   The "Data Collection Information Summary" for patients in Inpatient Rehabilitation Facilities with attached "Privacy Act [place] Records" was provided and verbally reviewed with: N/A  Emergency Contact Information         Contact Information    Name Relation Home Work Mobile   [facility] Son [phone]     Sarahanne, Novakowski  [phone]  [phone]      Current Medical History  Patient Admitting Diagnosis: debility  History of Present Illness: Natalie [name] is a 65 year old female with history of COPD- oxygen dependent, Hep C, remote seizure, CKD-III (Dr.Kolluru), frequent falls, chronic pain-on methadone, major depression (recent loss of partner and pet) who was admitted to [facility] with 09/28/20 after a neighbor found her confused with difficulty talking and covered in stool. UDS positive for methadone and benzo's. CT head negative and MRI ordered but aborted due to generalized seizure activity not responsive to ativan and started on propofol. She had tachypnea and tachycardia and was intubated emergently. She was loaded with Keppra and antibiotics due to concerns of aspiration PNA. Dr.Kirkpatrick expressed concerns of PRES but MRI brain negative for acute changes. LP done to rule out aseptic/viral meningitis and acycovir added. She continued to be encephalopathic and was transferred to [facility] for LT-EEG monitoring. EEG negative for seizures and showed metabolic encephalopathy and was found to have low folic HDQQ/I29 levels. HSV PCR negative and acyclovir d/c.  She tolerated extubation 03/30 and respiratory status stable. Keppra d/c per neurology input. 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 Stroke work up initiated and recommendations for 30-day event monitor were made.  Therapy evaluations were completed and pt was recommended for CIR.   Patient's medical record from [facility] has been reviewed by the rehabilitation admission coordinator and physician.  Past Medical History      Past Medical History:  Diagnosis Date  . Cataracts, bilateral   . Chickenpox cataracts  . COPD (chronic  obstructive pulmonary disease) ([place])   . Depression   . Drug addiction ([place])   . Headache   . Hepatitis C virus infection   . Hepatitis C without hepatic coma   . History of chicken pox   . History of colon polyps   . Seizures ([place])     Family History   family history includes Breast cancer in her paternal grandmother.  Prior Rehab/Hospitalizations Has the patient had prior rehab or hospitalizations prior to admission? No  Has the patient had major surgery during 100 days prior to admission? No              Current Medications  Current Facility-Administered Medications:  .  atorvastatin (LIPITOR) tablet 40 mg, 40 mg, Oral, Daily, Rosalin Hawking, MD, 40 mg at 10/10/20 1059 .  chlorhexidine gluconate (MEDLINE KIT) (PERIDEX) 0.12 % solution 15 mL, 15 mL, Mouth Rinse, BID, Salvadore Dom E, NP, 15 mL at 10/10/20 0834 .  clopidogrel (PLAVIX) tablet 75 mg, 75 mg, Oral, Daily, Amie Portland, MD, 75 mg at 10/10/20 1059 .  folic acid (FOLVITE) tablet 1 mg, 1 mg, Oral, Daily, Pashayan, Redgie Grayer, MD, 1 mg at 10/10/20 1058 .  gabapentin (NEURONTIN) 250 MG/5ML solution 100 mg, 100 mg, Oral, QHS, Samtani, Jai-Gurmukh, MD, 100 mg at 10/10/20 2312 .  ipratropium-albuterol (DUONEB) 0.5-2.5 (3) MG/3ML nebulizer solution 3 mL, 3 mL, Nebulization, Q6H PRN, Hosie Poisson, MD .  methadone (DOLOPHINE) tablet 25 mg, 25 mg, Oral, Q8H, Salvadore Dom E, NP, 25 mg at 10/11/20 0549 .  pantoprazole sodium (PROTONIX) 40 mg/20 mL oral suspension 40 mg, 40 mg, Oral, Q1200, Erick Colace, NP, 40 mg at 10/10/20 1236 .  polyethylene glycol (MIRALAX / GLYCOLAX) packet 17 g, 17 g, Oral, Daily PRN, Erick Colace, NP, 17 g at 10/08/20 0917 .  Resource ThickenUp Clear, , Oral, PRN, Erick Colace, NP, Given at 10/05/20 [phone] .  thiamine (B-1) injection 100 mg, 100 mg, Intravenous, Daily **OR** thiamine tablet 100 mg, 100 mg, Oral, Daily, Joselyn Glassman A, RPH .  vitamin B-12 (CYANOCOBALAMIN) tablet  1,000 mcg, 1,000 mcg, Oral, Daily, Pashayan, Redgie Grayer, MD, 1,000 mcg at 10/10/20 1059  Patients Current Diet:     Diet Order                  DIET - DYS 1 Room service appropriate? Yes; Fluid consistency: Thin  Diet effective now                  Precautions / Restrictions Precautions Precautions: Fall Precaution Comments: seizures Restrictions Weight Bearing Restrictions: No   Has the patient had 2 or more falls or a fall with injury in the past year? No  Prior Activity Level Community (5-7x/wk): drives, works  Prior Functional Level Self Care: Did the patient need help bathing, dressing, using the toilet or eating? Independent  Indoor Mobility: Did the patient need assistance with walking from room to room (with or  without device)? Independent  Stairs: Did the patient need assistance with internal or external stairs (with or without device)? Independent  Functional Cognition: Did the patient need help planning regular tasks such as shopping or remembering to take medications? Independent  Home Assistive Devices / Equipment Home Equipment: Walker - 2 wheels,Crutches,Bedside commode,Shower seat,Grab bars - toilet,Grab bars - tub/shower,Wheelchair - manual,Hospital bed  Prior Device Use: Indicate devices/aids used by the patient prior to current illness, exacerbation or injury? has cane but hasn't used in a few months per son report  Current Functional Level Cognition  Arousal/Alertness: Awake/alert Overall Cognitive Status: Impaired/Different from baseline Current Attention Level: Sustained Orientation Level: Oriented to person,Oriented to place,Oriented to situation Following Commands: Follows one step commands consistently,Follows one step commands with increased time,Follows multi-step commands inconsistently Safety/Judgement: Decreased awareness of safety,Decreased awareness of deficits General Comments: Asking therapist to "turn on her TVs"  pointing to remote monitor and call bell monitor on wall Attention: Focused,Sustained Focused Attention: Appears intact Sustained Attention: Impaired Sustained Attention Impairment: Verbal basic Memory: Impaired Memory Impairment: Storage deficit,Retrieval deficit,Decreased recall of new information Awareness: Impaired Awareness Impairment: Intellectual impairment,Emergent impairment Problem Solving: Impaired Problem Solving Impairment: Verbal basic,Functional basic Safety/Judgment: Impaired    Extremity Assessment (includes Sensation/Coordination)  Upper Extremity Assessment: Generalized weakness  Lower Extremity Assessment: RLE deficits/detail,LLE deficits/detail RLE Deficits / Details: MMT scores of 4 to 4+ grossly RLE Sensation:  (denies numbness/tingling) RLE Coordination: decreased fine motor,decreased gross motor LLE Deficits / Details: MMT scores of 4 to 4+ grossly LLE Sensation:  (denies numbness/tingling) LLE Coordination: decreased fine motor,decreased gross motor    ADLs  Overall ADL's : Needs assistance/impaired Eating/Feeding: Supervision/ safety,Set up,Sitting Grooming: Min guard,Wash/dry hands,Standing,Wash/dry face Grooming Details (indicate cue type and reason): at sink, cues to use soap Upper Body Bathing: Minimal assistance,Sitting Lower Body Bathing: Minimal assistance,Sit to/from stand Upper Body Dressing : Minimal assistance,Sitting Lower Body Dressing: Minimal assistance,Sit to/from stand Toilet Transfer: Minimal assistance,Ambulation,Regular Toilet,Grab bars Toilet Transfer Details (indicate cue type and reason): cueing for safey, pt attempting to get OOB independently therefore OTA providing assistance, pt unsteady on feet requiring one person HHA Toileting- Clothing Manipulation and Hygiene: Minimal assistance,Sit to/from stand Toileting - Clothing Manipulation Details (indicate cue type and reason): clothing mgmt and safety Functional mobility  during ADLs: Minimal assistance,Rolling walker,Cueing for safety,Cueing for sequencing General ADL Comments: Pt recieved getting OOB to go to the bathroom, provided one person HHA, significant confusion and impulsivity noted    Mobility  Overal bed mobility: Needs Assistance Bed Mobility: Supine to Sit,Sit to Supine Supine to sit: Supervision Sit to supine: Supervision General bed mobility comments: cueing for safety, supervision for safety    Transfers  Overall transfer level: Needs assistance Equipment used: 1 person hand held assist Transfers: Sit to/from Stand Sit to Stand: Min assist General transfer comment: min assist to steady from EOB due to impulsivity using one person HHA but recommending RW for longer distances    Ambulation / Gait / Stairs / Wheelchair Mobility  Ambulation/Gait Ambulation/Gait assistance: Min assist,Mod assist Gait Distance (Feet): 200 Feet Assistive device: Rolling walker (2 wheeled),1 person hand held assist Gait Pattern/deviations: Step-through pattern,Decreased step length - right,Decreased step length - left,Decreased stride length,Narrow base of support General Gait Details: Slow and guarded with narrow BOS at times; erratic step width; Pt able to improve posture with cues but not maintaining; tends to reach out for UE support when walking without RW Gait velocity: Decreased Gait velocity interpretation: <1.8 ft/sec, indicate of risk for  recurrent falls    Posture / Balance Dynamic Sitting Balance Sitting balance - Comments: Static sitting EOB with min guard. Static Standing Balance Single Leg Stance - Right Leg: 2 (seconds) Single Leg Stance - Left Leg: 2 (seconds) Balance Overall balance assessment: Needs assistance Sitting-balance support: No upper extremity supported,Feet supported Sitting balance-Leahy Scale: Fair Sitting balance - Comments: Static sitting EOB with min guard. Standing balance support: Bilateral upper extremity  supported,During functional activity,No upper extremity supported Standing balance-Leahy Scale: Poor Standing balance comment: relies on UE support dynamically, able to engage in ADLs statically with min guard Single Leg Stance - Right Leg: 2 (seconds) Single Leg Stance - Left Leg: 2 (seconds)    Special needs/care consideration Skin Ecchymosis: arm/bilateral; Catheter entry/exit: back/lower and Designated visitor Annaclaire Walsworth, son   Previous Home Environment (from acute therapy documentation) Living Arrangements: Children (Son in the process of moving in with her)  Lives With: Son Available Help at Discharge: Family,Available 24 hours/day Type of Home: House Home Layout: Two level Alternate Level Stairs-Rails: Right Alternate Level Stairs-Number of Steps: 14 Home Access: Stairs to enter Entrance Stairs-Rails: Left Entrance Stairs-Number of Steps: 5 Bathroom Shower/Tub: Chiropodist: Standard Bathroom Accessibility: Yes How Accessible: Accessible via walker [place]: No Additional Comments: Unsure how much is accurate, pt is poor historian. She states her son has been living with her, even before her significant other passed away.  Discharge Living Setting Plans for Discharge Living Setting: Patient's home Type of Home at Discharge: House Discharge Home Layout: Two level Alternate Level Stairs-Rails: Right Alternate Level Stairs-Number of Steps: 14 Discharge Home Access: Stairs to enter Entrance Stairs-Rails: Left Entrance Stairs-Number of Steps: 5 Discharge Bathroom Shower/Tub: Tub/shower unit Discharge Bathroom Toilet: Standard Discharge Bathroom Accessibility: Yes How Accessible: Accessible via walker Does the patient have any problems obtaining your medications?: No  Social/Family/Support Systems Anticipated Caregiver: Arwyn Besaw, son Anticipated Caregiver's Contact Information: [phone] Caregiver Availability: 24/7 Discharge  Plan Discussed with Primary Caregiver: Yes Is Caregiver In Agreement with Plan?: Yes Does Caregiver/Family have Issues with Lodging/Transportation while Pt is in Rehab?: No  Goals Patient/Family Goal for Rehab: Mod I: PT/OT/SLP Expected length of stay: 7-10 days Pt/Family Agrees to Admission and willing to participate: Yes Program Orientation Provided & Reviewed with Pt/Caregiver Including Roles  & Responsibilities: Yes  Decrease burden of Care through IP rehab admission: NA  Possible need for SNF placement upon discharge: Not anticipated  Patient Condition: I have reviewed [name], spoken with CSW, and patient and son. I met with patient at the bedside for inpatient rehabilitation assessment.  Patient will benefit from ongoing PT, OT and SLP, can actively participate in 3 hours of therapy a day 5 days of the week, and can make measurable gains during the admission.  Patient will also benefit from the coordinated team approach during an Inpatient Acute Rehabilitation admission.  The patient will receive intensive therapy as well as Rehabilitation physician, nursing, social worker, and care management interventions.  Due to bladder management, bowel management, safety, skin/wound care, disease management, medication administration, pain management and patient education the patient requires 24 hour a day rehabilitation nursing.  The patient is currently min to mod assist with mobility and basic ADLs.  Discharge setting and therapy post discharge at home with home health is anticipated.  Patient has agreed to participate in the Acute Inpatient Rehabilitation Program and will admit today.  Preadmission Screen Completed By:  Retta Diones, 10/11/2020 10:16  AM ______________________________________________________________________   Discussed status with Dr. Dagoberto Ligas and Dr. Naaman Plummer on 10/11/20 at 1000 and received approval for admission today.  Admission Coordinator:   Retta Diones, RN, time 1300/Date 10/11/20   Assessment/Plan: Diagnosis: 1. Does the need for close, 24 hr/day Medical supervision in concert with the patient's rehab needs make it unreasonable for this patient to be served in a less intensive setting? Yes 2. Co-Morbidities requiring supervision/potential complications: L MCA infarct, seizures?, CKD3, chronic pain, on methadone; depression- lost partner/pet; frequent falls, COPD- O2 dependent.  3. Due to bladder management, bowel management, safety, skin/wound care, disease management, medication administration, pain management and patient education, does the patient require 24 hr/day rehab nursing? Yes 4. Does the patient require coordinated care of a physician, rehab nurse, PT, OT, and SLP to address physical and functional deficits in the context of the above medical diagnosis(es)? Yes Addressing deficits in the following areas: balance, endurance, locomotion, strength, transferring, bowel/bladder control, bathing, dressing, feeding, grooming, toileting, cognition, speech, language and swallowing 5. Can the patient actively participate in an intensive therapy program of at least 3 hrs of therapy 5 days a week? Yes 6. The potential for patient to make measurable gains while on inpatient rehab is good and fair 7. Anticipated functional outcomes upon discharge from inpatient rehab: modified independent and supervision PT, modified independent and supervision OT, supervision and min assist SLP 8. Estimated rehab length of stay to reach the above functional goals is: 7-10 days 9. Anticipated discharge destination: Home 10. Overall Rehab/Functional Prognosis: good and fair   MD Signature:           Revision History                                                 Note Details  Jan Fireman, MD File Time 10/11/2020 1:21 PM  Author Type Physician Status Signed  Last Editor Courtney Heys, MD Service Physical Medicine and  [place] # [id number] Admit Date 10/11/2020

## 2020-10-11 NOTE — Progress Notes (Signed)
Met with the patient for introduction and review of the role of the nurse CM and collaboration with the SW to facilitate discharge process. Patient with encephalopathy; confused, pleasant but unable to note why she is in the hospital. Does acknowledge awareness of having seizures but does not recall other information. Handouts and educational materials on secondary stroke risks left in the patient room for the patient's son. Continue to follow up with patient's son to review concerns, educational needs. Margarito Liner

## 2020-10-11 NOTE — H&P (Signed)
Physical Medicine and Rehabilitation Admission H&P    CC: Functional deficits due to encephalopathy.    HPI: Natalie [name] is a 65 year old female with history of COPD- oxygen dependent, Hep C, remote seizure, CKD-III (Dr.Kolluru), frequent falls, chronic pain-on methadone, major depression (recent loss of partner and pet) who was admitted to [facility] with 09/28/20 after a neighbor found her confused with difficulty talking and covered in stool.  UDS positive for methadone and benzo's. CT head negative and MRI ordered but aborted due to generalized seizure activity not responsive to ativan and started on propofol. She had tachypnea and tachycardia and was intubated emergently. She was loaded with Keppra and antibiotics due to concerns of aspiration PNA.  Dr.Kirkpatrick expressed concerns of PRES but MRI brain negative for acute changes.  LP done to rule out aseptic/viral meningitis and acycovir added. She continued to be encephalopathic and was transferred to [facility] for LT-EEG monitoring.  EEG negative for seizures and showed metabolic encephalopathy and was found to have low folic BDZH/G99 levels. HSV PCR negative and acyclovir d/c.  She tolerated extubation 03/30 and respiratory status stable. Keppra d/c per neurology input. 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  Stroke work up initiated--LE dopplers negative and 30 day event monitor recommended by Dr. Erlinda Hong to rule out source of embolic stroke. Plavix added due to ASA allergy. She has had worsening of confusion with perseveration, confabulation, poor awareness of deficits as well as balance deficits with unsteady gait. CIR recommended due to functional decline.     Pt reports " I usually have a BM every few months"!; also said the same for  bladder- denies having BM lately or voiding "in days".  Pt thinks her speech is better daily and doesn't feel she has any word errors today.  Uses O2 "prn" at home- not all the time.  Denies current pain, and said "doesn't have often", but when does takes pain meds- was hard to determine location of her pain- it appears it's low back? She said she only took Methadone "prn", but I don't think that's accurate.  Review of Systems  Unable to perform ROS: Mental acuity  Eyes: Negative for blurred vision.  Gastrointestinal: Negative for abdominal pain.  Neurological: Positive for headaches.  Psychiatric/Behavioral: Positive for memory loss.      Past Medical History:  Diagnosis Date  . Cataracts, bilateral   . Chickenpox cataracts  . COPD (chronic obstructive pulmonary disease) ([place])   . Depression   . Drug addiction ([place])   . Headache   . Hepatitis C virus infection   . Hepatitis C without hepatic coma   . History of chicken pox   . History of colon polyps   . Seizures ([place])     Past Surgical History:  Procedure Laterality Date  . APPENDECTOMY    . CHOLECYSTECTOMY    . COLONOSCOPY WITH PROPOFOL N/A 04/30/2015   Procedure: COLONOSCOPY WITH PROPOFOL;  Surgeon: Josefine Class, MD;  Location: [facility] ENDOSCOPY;  Service: Endoscopy;  Laterality: N/A;  . COLONOSCOPY WITH PROPOFOL N/A 07/13/2017   Procedure: COLONOSCOPY WITH PROPOFOL;  Surgeon: Manya Silvas, MD;  Location: [facility] ENDOSCOPY;  Service: Endoscopy;  Laterality: N/A;  . ESOPHAGOGASTRODUODENOSCOPY (EGD) WITH PROPOFOL N/A 07/13/2017   Procedure: ESOPHAGOGASTRODUODENOSCOPY (EGD) WITH PROPOFOL;  Surgeon: Vira Agar,  Gavin Pound, MD;  Location: [facility] ENDOSCOPY;  Service: Endoscopy;  Laterality: N/A;  . EYE SURGERY      Family History  Problem Relation Age of Onset  . Breast cancer Paternal Grandmother     Social History:  Has lived alone for 2 months since boyfriend passed away. Son plans on moving in to assist after discharge.  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 Per reports that she quit smoking at least 3 years ago.  She has a 20.50 pack-year smoking history. She has never used smokeless tobacco. She reports that she does not drink alcohol and does not use drugs.   Allergies  Allergen Reactions  . Sulfasalazine Shortness Of Breath  . Aspirin   . Darvon [Propoxyphene]   . Iodine   . Penicillins   . Sulfa Antibiotics   . Zithromax [Azithromycin]     Medications Prior to Admission  Medication Sig Dispense Refill  . [START ON 10/12/2020] atorvastatin (LIPITOR) 40 MG tablet Take 1 tablet (40 mg total) by mouth daily. 30 tablet 1  . [START ON 10/12/2020] clopidogrel (PLAVIX) 75 MG tablet Take 1 tablet (75 mg total) by mouth daily. 30 tablet 1  . [START ON 09/10/4678] folic acid (FOLVITE) 1 MG tablet Take 1 tablet (1 mg total) by mouth daily. 30 tablet 1  . gabapentin (NEURONTIN) 250 MG/5ML solution Take 2 mLs (100 mg total) by mouth at bedtime. 470 mL 12  . Maltodextrin-Xanthan Gum (RESOURCE THICKENUP CLEAR) POWD Use as needed. 288 g 4  . methadone (DOLOPHINE) 5 MG tablet Take 5 tablets (25 mg total) by mouth every 8 (eight) hours. 30 tablet 0  . [START ON 10/12/2020] pantoprazole sodium (PROTONIX) 40 mg/20 mL PACK Take 20 mLs (40 mg total) by mouth daily at 12 noon. 30 mL 0  . [START ON 10/12/2020] thiamine 100 MG tablet Take 1 tablet (100 mg total) by mouth daily. 30 tablet 1  . [START ON 10/12/2020] vitamin B-12 1000 MCG tablet Take 1 tablet (1,000 mcg total) by mouth daily. 30 tablet 1    Drug Regimen Review  Drug regimen was reviewed and remains appropriate with no significant issues identified  Home: Home Living Living Arrangements: Children,Other (Comment) (son)   Functional History:    Functional Status:  Mobility:          ADL:    Cognition: Cognition Orientation Level: Oriented to person,Oriented to place,Disoriented to time,Disoriented to situation     Weight 48.3 kg. Physical Exam Vitals  and nursing note reviewed.  Constitutional:      Appearance: She is ill-appearing.     Comments: Pt sitting up in bed; appears OK, but saying things don't sound correct about B/B and pain- hard to distinguish if due to confusion or aphasia or both, NAD  HENT:     Head: Normocephalic and atraumatic.     Comments: Smile equal/tongue midline    Right Ear: External ear normal.     Left Ear: External ear normal.     Nose: Nose normal. No congestion.     Mouth/Throat:     Mouth: Mucous membranes are dry.     Pharynx: Oropharynx is clear. No oropharyngeal exudate.  Eyes:     General:        Right eye: No discharge.        Left eye: No discharge.     Extraocular Movements: Extraocular movements intact.  Cardiovascular:     Rate and Rhythm: Normal rate and regular rhythm.  Heart sounds: Normal heart sounds. No murmur heard. No gallop.   Pulmonary:     Comments: Slightly coarse, but good air movement- no W/R/R Abdominal:     Comments: .soft, but appears somewhat distended; hypoactive; NT  Musculoskeletal:     Cervical back: Normal range of motion and neck supple.     Comments: MS: UE's 5/5 in LUE RUE- 5/5 except finger abd 5-/5 LLE- 5/5 RLE_ HF 5-/5, otherwise 5/5   Skin:    General: Skin is warm and dry.     Comments: No skin breakdown seen  Neurological:     Mental Status: She is alert.     Comments: Oriented to self and place. Unable to recall age/dob/month. Perseverative speech at times. Kept eyes closed--briefly opened them--pupils equal. Restless and tried to get out of bed multiple times once activated.  Very perseverative- when completed 1 task, kept repeating, the verbal as well as strength exam- esp verbally.  Naming - called pen a pencil- but then called a stethoscope a pencil; and a cell phone was a "spencil" and " I make pencils on them when asked that they do. " Significant anomia, but also wasn't able to recognize the word substitutions/errors at all. Not able to name  where she was/why here- confusion vs aphasia?  Psychiatric:        Behavior: Behavior is uncooperative.     Comments: Flat, slightly confused; slightly impulsive     Results for orders placed or performed during the hospital encounter of 09/30/20 (from the past 48 hour(s))  Hemoglobin A1c     Status: None   Collection Time: 10/10/20  4:06 AM  Result Value Ref Range   Hgb A1c MFr Bld 5.6 4.8 - 5.6 %    Comment: (NOTE) Pre diabetes:          5.7%-6.4%  Diabetes:              >6.4%  Glycemic control for   <7.0% adults with diabetes    Mean Plasma Glucose 114.02 mg/dL    Comment: Performed at [facility] [address] [place], [place] [address]  Lipid panel     Status: Abnormal   Collection Time: 10/10/20  4:06 AM  Result Value Ref Range   Cholesterol 207 (H) 0 - 200 mg/dL   Triglycerides 69 <150 mg/dL   HDL 57 >40 mg/dL   Total CHOL/HDL Ratio 3.6 RATIO   VLDL 14 0 - 40 mg/dL   LDL Cholesterol 136 (H) 0 - 99 mg/dL    Comment:        Total Cholesterol/HDL:CHD Risk Coronary Heart Disease Risk Table                     Men   Women  1/2 Average Risk   3.4   3.3  Average Risk       5.0   4.4  2 X Average Risk   9.6   7.1  3 X Average Risk  23.4   11.0        Use the calculated Patient Ratio above and the CHD Risk Table to determine the patient's CHD Risk.        ATP III CLASSIFICATION (LDL):  <100     mg/dL   Optimal  100-129  mg/dL   Near or Above                    Optimal  130-159  mg/dL  Borderline  160-189  mg/dL   High  >190     mg/dL   Very High Performed at [facility] [address], [place], [place] [address]   Renal function panel     Status: Abnormal   Collection Time: 10/10/20  4:06 AM  Result Value Ref Range   Sodium 141 135 - 145 mmol/L   Potassium 4.0 3.5 - 5.1 mmol/L   Chloride 100 98 - 111 mmol/L   CO2 32 22 - 32 mmol/L   Glucose, Bld 102 (H) 70 - 99 mg/dL    Comment: Glucose reference range applies only to samples taken after  fasting for at least 8 hours.   BUN 16 8 - 23 mg/dL   Creatinine, Ser 1.19 (H) 0.44 - 1.00 mg/dL   Calcium 10.1 8.9 - 10.3 mg/dL   Phosphorus 5.3 (H) 2.5 - 4.6 mg/dL   Albumin 3.4 (L) 3.5 - 5.0 g/dL   GFR, Estimated 51 (L) >60 mL/min    Comment: (NOTE) Calculated using the CKD-EPI Creatinine Equation (2021)    Anion gap 9 5 - 15    Comment: Performed at [facility] [address] [place], [place] [address]   MR ANGIO HEAD WO CONTRAST  Result Date: 10/10/2020 CLINICAL DATA:  Stroke follow-up. EXAM: MRA HEAD WITHOUT CONTRAST TECHNIQUE: Angiographic images of the Circle of Willis were obtained using MRA technique without intravenous contrast. COMPARISON:  None. FINDINGS: The visualized portions of the intracranial vertebral arteries are widely patent to the basilar. Patent left PICA, right AICA, and bilateral SCA origins are identified. The basilar artery is widely patent. Posterior communicating arteries are not clearly identified and may be diminutive or absent. Both PCAs are patent proximally without evidence of a significant P1 or P2 stenosis. There are missing right P3 and more distal branch vessels. The internal carotid arteries are widely patent from skull base to carotid termini. A1 and M1 segments are widely patent bilaterally. There is occlusion of the left M2 superior division 1 cm distal to the MCA bifurcation. No aneurysm is identified. IMPRESSION: 1. Left M2 superior division occlusion. 2. Multiple missing distal right PCA branch vessels. Electronically Signed   By: Logan Bores M.D.   On: 10/10/2020 17:03   MR ANGIO NECK W WO CONTRAST  Result Date: 10/10/2020 CLINICAL DATA:  Stroke follow-up. EXAM: MRA NECK WITHOUT AND WITH CONTRAST TECHNIQUE: Multiplanar and multiecho pulse sequences of the neck were obtained without and with intravenous contrast. Angiographic images of the neck were obtained using MRA technique without and with intravenous contrast. CONTRAST:  5.19mL GADAVIST  GADOBUTROL 1 MMOL/ML IV SOLN COMPARISON:  None. FINDINGS: There is a normal variant aortic arch branching pattern with common origin of the brachiocephalic and left common carotid arteries. There are mild stenoses of both subclavian arteries proximally. The common carotid and cervical internal carotid arteries are patent bilaterally without evidence of a dissection or significant stenosis. There is mild irregularity of the mid and distal left common carotid artery which may reflect atherosclerosis resulting in less than 50% narrowing. The vertebral arteries are patent and codominant with antegrade flow bilaterally. No dissection or significant vertebral artery stenosis is identified. IMPRESSION: 1. No significant cervical carotid or vertebral artery stenosis. 2. Mild bilateral subclavian artery stenoses. Electronically Signed   By: Logan Bores M.D.   On: 10/10/2020 18:06   ECHOCARDIOGRAM COMPLETE  Result Date: 10/10/2020    ECHOCARDIOGRAM REPORT   Patient Name:   Natalie [name] Date  of Exam: 10/10/2020 Medical Rec #:  [id number]   Height:       63.0 in Accession #:    [id number]  Weight:       114.6 lb Date of Birth:  1955/11/20   BSA:          1.526 m Patient Age:    57 years    BP:           124/61 mmHg Patient Gender: F           HR:           89 bpm. Exam Location:  Inpatient Procedure: 2D Echo, 3D Echo, Cardiac Doppler, Color Doppler and Strain Analysis Indications:    Stroke I63.9  History:        Patient has no prior history of Echocardiogram examinations.                 COPD; Risk Factors:Current Smoker.  Sonographer:    Darlina Sicilian RDCS Referring Phys: [id number] ASHISH ARORA IMPRESSIONS  1. Left ventricular ejection fraction, by estimation, is 55 to 60%. Left ventricular ejection fraction by 3D volume is 56 %. The left ventricle has normal function. The left ventricle has no regional wall motion abnormalities. Left ventricular diastolic  parameters are consistent with Grade I diastolic dysfunction (impaired  relaxation). The average left ventricular global longitudinal strain is -23.4 %. The global longitudinal strain is normal.  2. Right ventricular systolic function is normal. The right ventricular size is normal. Tricuspid regurgitation signal is inadequate for assessing PA pressure.  3. The mitral valve is abnormal. Trivial mitral valve regurgitation.  4. The aortic valve is calcified. Aortic valve regurgitation is mild. Mild to moderate aortic valve sclerosis/calcification is present, without any evidence of aortic stenosis.  5. The inferior vena cava is normal in size with greater than 50% respiratory variability, suggesting right atrial pressure of 3 mmHg. Comparison(s): No prior Echocardiogram. Conclusion(s)/Recommendation(s): No intracardiac source of embolism detected on this transthoracic study. A transesophageal echocardiogram is recommended to exclude cardiac source of embolism if clinically indicated. FINDINGS  Left Ventricle: Left ventricular ejection fraction, by estimation, is 55 to 60%. Left ventricular ejection fraction by 3D volume is 56 %. The left ventricle has normal function. The left ventricle has no regional wall motion abnormalities. The average left ventricular global longitudinal strain is -23.4 %. The global longitudinal strain is normal. The left ventricular internal cavity size was normal in size. There is no left ventricular hypertrophy. Left ventricular diastolic parameters are consistent  with Grade I diastolic dysfunction (impaired relaxation). Indeterminate filling pressures. Right Ventricle: The right ventricular size is normal. No increase in right ventricular wall thickness. Right ventricular systolic function is normal. Tricuspid regurgitation signal is inadequate for assessing PA pressure. Left Atrium: Left atrial size was normal in size. Right Atrium: Right atrial size was normal in size. Pericardium: There is no evidence of pericardial effusion. Mitral Valve: The mitral valve  is abnormal. There is mild thickening of the mitral valve leaflet(s). Mild mitral annular calcification. Trivial mitral valve regurgitation. Tricuspid Valve: The tricuspid valve is grossly normal. Tricuspid valve regurgitation is trivial. Aortic Valve: The aortic valve is calcified. There is mild to moderate aortic valve annular calcification. Aortic valve regurgitation is mild. Aortic regurgitation PHT measures 342 msec. Mild to moderate aortic valve sclerosis/calcification is present, without any evidence of aortic stenosis. Pulmonic Valve: The pulmonic valve was grossly normal. Pulmonic valve regurgitation is trivial. Aorta: The aortic root and ascending aorta are  structurally normal, with no evidence of dilitation. Venous: The inferior vena cava is normal in size with greater than 50% respiratory variability, suggesting right atrial pressure of 3 mmHg. IAS/Shunts: No atrial level shunt detected by color flow Doppler.  LEFT VENTRICLE PLAX 2D LVIDd:         4.40 cm         Diastology LVIDs:         3.00 cm         LV e' medial:    6.20 cm/s LV PW:         0.70 cm         LV E/e' medial:  8.7 LV IVS:        0.90 cm         LV e' lateral:   7.29 cm/s LVOT diam:     2.00 cm         LV E/e' lateral: 7.4 LV SV:         58 LV SV Index:   38              2D LVOT Area:     3.14 cm        Longitudinal                                Strain                                2D Strain GLS  -21.7 %                                (A2C):                                2D Strain GLS  -21.0 %                                (A3C):                                2D Strain GLS  -27.5 %                                (A4C):                                2D Strain GLS  -23.4 %                                Avg:                                 3D Volume EF                                LV 3D EF:    Left  ventricular                                             ejection                                              fraction by                                             3D volume                                             is 56 %.                                 3D Volume EF:                                3D EF:        56 % RIGHT VENTRICLE RV S prime:     16.20 cm/s TAPSE (M-mode): 1.6 cm LEFT ATRIUM             Index       RIGHT ATRIUM          Index LA diam:        3.20 cm 2.10 cm/m  RA Area:     9.62 cm LA Vol (A2C):   21.4 ml 14.02 ml/m RA Volume:   18.50 ml 12.12 ml/m LA Vol (A4C):   17.8 ml 11.66 ml/m LA Biplane Vol: 20.5 ml 13.43 ml/m  AORTIC VALVE LVOT Vmax:   116.00 cm/s LVOT Vmean:  68.100 cm/s LVOT VTI:    0.186 m AI PHT:      342 msec  AORTA Ao Root diam: 2.70 cm MITRAL VALVE MV Area (PHT): 3.08 cm    SHUNTS MV Decel Time: 246 msec    Systemic VTI:  0.19 m MV E velocity: 54.00 cm/s  Systemic Diam: 2.00 cm MV A velocity: 58.30 cm/s MV E/A ratio:  0.93 Lyman Bishop MD Electronically signed by Lyman Bishop MD Signature Date/Time: 10/10/2020/10:50:09 AM    Final    VAS [place] LOWER EXTREMITY VENOUS (DVT)  Result Date: 10/10/2020  Lower Venous DVT Study Indications: Stroke.  Comparison Study: No prior study Performing Technologist: Maudry Mayhew MHA, RDMS, RVT, RDCS  Examination Guidelines: A complete evaluation includes B-mode imaging, spectral Doppler, color Doppler, and power Doppler as needed of all accessible portions of each vessel. Bilateral testing is considered an integral part of a complete examination. Limited examinations for reoccurring indications may be performed as noted. The reflux portion of the exam is performed with the patient in reverse Trendelenburg.  +---------+---------------+---------+-----------+----------+--------------+ RIGHT    CompressibilityPhasicitySpontaneityPropertiesThrombus Aging +---------+---------------+---------+-----------+----------+--------------+ CFV      Full           Yes      Yes                                  +---------+---------------+---------+-----------+----------+--------------+  SFJ      Full                                                        +---------+---------------+---------+-----------+----------+--------------+ FV Prox  Full                                                        +---------+---------------+---------+-----------+----------+--------------+ FV Mid   Full                                                        +---------+---------------+---------+-----------+----------+--------------+ FV DistalFull                                                        +---------+---------------+---------+-----------+----------+--------------+ PFV      Full                                                        +---------+---------------+---------+-----------+----------+--------------+ POP      Full           Yes      Yes                                 +---------+---------------+---------+-----------+----------+--------------+ PTV      Full                                                        +---------+---------------+---------+-----------+----------+--------------+ PERO     Full                                                        +---------+---------------+---------+-----------+----------+--------------+   +---------+---------------+---------+-----------+----------+--------------+ LEFT     CompressibilityPhasicitySpontaneityPropertiesThrombus Aging +---------+---------------+---------+-----------+----------+--------------+ CFV      Full           Yes      Yes                                 +---------+---------------+---------+-----------+----------+--------------+ SFJ      Full                                                        +---------+---------------+---------+-----------+----------+--------------+  FV Prox  Full                                                         +---------+---------------+---------+-----------+----------+--------------+ FV Mid   Full                                                        +---------+---------------+---------+-----------+----------+--------------+ FV DistalFull                                                        +---------+---------------+---------+-----------+----------+--------------+ PFV      Full                                                        +---------+---------------+---------+-----------+----------+--------------+ POP      Full           Yes      Yes                                 +---------+---------------+---------+-----------+----------+--------------+ PTV      Full                                                        +---------+---------------+---------+-----------+----------+--------------+ PERO     Full                                                        +---------+---------------+---------+-----------+----------+--------------+     Summary: RIGHT: - There is no evidence of deep vein thrombosis in the lower extremity.  - No cystic structure found in the popliteal fossa.  LEFT: - There is no evidence of deep vein thrombosis in the lower extremity.  - No cystic structure found in the popliteal fossa.  *See table(s) above for measurements and observations. Electronically signed by Harold Barban MD on 10/10/2020 at 66:57:39 PM.    Final        Medical Problem List and Plan: 1.  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   -patient may  shower  -ELOS/Goals: 2-3 weeks- mod I to supervision for PT,OT and SLP 2.  Antithrombotics: -DVT/anticoagulation:  Pharmaceutical: Lovenox  -antiplatelet therapy: Plavix 3. Chronic pain/Pain Management: Methadone 25 mg tid with gabapentin at bedtime.  4. Mood: LCSW to follow for evaluation and support.   -antipsychotic agents: N/A 5.  Neuropsych: This patient is not capable  of making decisions on her own behalf. 6. Skin/Wound Care: Routine pressure relief measures.  7. Fluids/Electrolytes/Nutrition: Monitor I/O. Check lytes in am.  8. Vitamin B12 deficiency: Continue po supplement.  9. Thiamine deficiency:  Continue po thiamine daily.  10. CKD: SCr 1.2 at baseline. Will monitor BMET serially. 11. COPD: Was using oxygen 3L per [place] in addition to nebs (Per family/record review)  Will resume at 1 liter and titrate as needed.  Nebs prn. Of note, wasn't wearing O2 when admitted.  12. Encephalopathy: Will order enclosure bed due to ongoing confusion and fall risk. 13. Aphasia/confusion- as per #12.  14. Constipation?- not clear when had her LBM- will verify is voiding and having BMs   Bary Leriche- PA-C 10/11/2020    I have personally performed a face to face diagnostic evaluation of this patient and formulated the key components of the plan.  Additionally, I have personally reviewed laboratory data, imaging studies, as well as relevant notes and concur with the physician assistant's documentation above.   The patient's status has not changed from the original H&P.  Any changes in documentation from the acute care chart have been noted above.     Courtney Heys, MD 10/11/2020

## 2020-10-11 NOTE — Progress Notes (Addendum)
STROKE TEAM PROGRESS NOTE   INTERVAL HISTORY Patient reports she is very tired today and somewhat irritable but willing to talk. Patient reports that she does not want to be transferred to CIR today, and would rather go home "to rest" and then "return on Monday." Patient agrees that it is dangerous for her to go home, but still would like to go and appears to perseverate on going home, and not able to talk about anything else.   Vitals:   10/10/20 2332 10/11/20 0413 10/11/20 0742 10/11/20 1103  BP: 111/76 136/68 132/76 111/67  Pulse: 85 66 63 67  Resp:  10    Temp: 98 F (36.7 C) 97.7 F (36.5 C) 97.6 F (36.4 C) 98.3 F (36.8 C)  TempSrc: Oral Axillary Oral Oral  SpO2: (!) 89%  92% 92%  Weight:       CBC:  Recent Labs  Lab 10/07/20 0123  WBC 5.7  NEUTROABS 4.0  HGB 12.9  HCT 40.3  MCV 96.6  PLT 979   Basic Metabolic Panel:  Recent Labs  Lab 10/09/20 0705 10/10/20 0406  NA 139 141  K 3.7 4.0  CL 102 100  CO2 30 32  GLUCOSE 102* 102*  BUN 10 16  CREATININE 0.98 1.19*  CALCIUM 9.6 10.1  PHOS 4.5 5.3*   Lipid Panel:  Recent Labs  Lab 10/10/20 0406  CHOL 207*  TRIG 69  HDL 57  CHOLHDL 3.6  VLDL 14  LDLCALC 136*   HgbA1c:  Recent Labs  Lab 10/10/20 0406  HGBA1C 5.6   Urine Drug Screen: No results for input(s): LABOPIA, COCAINSCRNUR, LABBENZ, AMPHETMU, THCU, LABBARB in the last 168 hours.  Alcohol Level No results for input(s): ETH in the last 168 hours.  IMAGING past 24 hours MR ANGIO HEAD WO CONTRAST  Result Date: 10/10/2020 CLINICAL DATA:  Stroke follow-up. EXAM: MRA HEAD WITHOUT CONTRAST TECHNIQUE: Angiographic images of the Circle of Willis were obtained using MRA technique without intravenous contrast. COMPARISON:  None. FINDINGS: The visualized portions of the intracranial vertebral arteries are widely patent to the basilar. Patent left PICA, right AICA, and bilateral SCA origins are identified. The basilar artery is widely patent. Posterior  communicating arteries are not clearly identified and may be diminutive or absent. Both PCAs are patent proximally without evidence of a significant P1 or P2 stenosis. There are missing right P3 and more distal branch vessels. The internal carotid arteries are widely patent from skull base to carotid termini. A1 and M1 segments are widely patent bilaterally. There is occlusion of the left M2 superior division 1 cm distal to the MCA bifurcation. No aneurysm is identified. IMPRESSION: 1. Left M2 superior division occlusion. 2. Multiple missing distal right PCA branch vessels. Electronically Signed   By: Logan Bores M.D.   On: 10/10/2020 17:03   MR ANGIO NECK W WO CONTRAST  Result Date: 10/10/2020 CLINICAL DATA:  Stroke follow-up. EXAM: MRA NECK WITHOUT AND WITH CONTRAST TECHNIQUE: Multiplanar and multiecho pulse sequences of the neck were obtained without and with intravenous contrast. Angiographic images of the neck were obtained using MRA technique without and with intravenous contrast. CONTRAST:  5.55mL GADAVIST GADOBUTROL 1 MMOL/ML IV SOLN COMPARISON:  None. FINDINGS: There is a normal variant aortic arch branching pattern with common origin of the brachiocephalic and left common carotid arteries. There are mild stenoses of both subclavian arteries proximally. The common carotid and cervical internal carotid arteries are patent bilaterally without evidence of a dissection or significant stenosis. There  is mild irregularity of the mid and distal left common carotid artery which may reflect atherosclerosis resulting in less than 50% narrowing. The vertebral arteries are patent and codominant with antegrade flow bilaterally. No dissection or significant vertebral artery stenosis is identified. IMPRESSION: 1. No significant cervical carotid or vertebral artery stenosis. 2. Mild bilateral subclavian artery stenoses. Electronically Signed   By: Logan Bores M.D.   On: 10/10/2020 18:06   VAS [place] LOWER EXTREMITY  VENOUS (DVT)  Result Date: 10/10/2020  Lower Venous DVT Study Indications: Stroke.  Comparison Study: No prior study Performing Technologist: Maudry Mayhew MHA, RDMS, RVT, RDCS  Examination Guidelines: A complete evaluation includes B-mode imaging, spectral Doppler, color Doppler, and power Doppler as needed of all accessible portions of each vessel. Bilateral testing is considered an integral part of a complete examination. Limited examinations for reoccurring indications may be performed as noted. The reflux portion of the exam is performed with the patient in reverse Trendelenburg.  +---------+---------------+---------+-----------+----------+--------------+ RIGHT    CompressibilityPhasicitySpontaneityPropertiesThrombus Aging +---------+---------------+---------+-----------+----------+--------------+ CFV      Full           Yes      Yes                                 +---------+---------------+---------+-----------+----------+--------------+ SFJ      Full                                                        +---------+---------------+---------+-----------+----------+--------------+ FV Prox  Full                                                        +---------+---------------+---------+-----------+----------+--------------+ FV Mid   Full                                                        +---------+---------------+---------+-----------+----------+--------------+ FV DistalFull                                                        +---------+---------------+---------+-----------+----------+--------------+ PFV      Full                                                        +---------+---------------+---------+-----------+----------+--------------+ POP      Full           Yes      Yes                                 +---------+---------------+---------+-----------+----------+--------------+ PTV  Full                                                         +---------+---------------+---------+-----------+----------+--------------+ PERO     Full                                                        +---------+---------------+---------+-----------+----------+--------------+   +---------+---------------+---------+-----------+----------+--------------+ LEFT     CompressibilityPhasicitySpontaneityPropertiesThrombus Aging +---------+---------------+---------+-----------+----------+--------------+ CFV      Full           Yes      Yes                                 +---------+---------------+---------+-----------+----------+--------------+ SFJ      Full                                                        +---------+---------------+---------+-----------+----------+--------------+ FV Prox  Full                                                        +---------+---------------+---------+-----------+----------+--------------+ FV Mid   Full                                                        +---------+---------------+---------+-----------+----------+--------------+ FV DistalFull                                                        +---------+---------------+---------+-----------+----------+--------------+ PFV      Full                                                        +---------+---------------+---------+-----------+----------+--------------+ POP      Full           Yes      Yes                                 +---------+---------------+---------+-----------+----------+--------------+ PTV      Full                                                        +---------+---------------+---------+-----------+----------+--------------+  PERO     Full                                                        +---------+---------------+---------+-----------+----------+--------------+     Summary: RIGHT: - There is no evidence of deep vein thrombosis in the lower extremity.  - No cystic  structure found in the popliteal fossa.  LEFT: - There is no evidence of deep vein thrombosis in the lower extremity.  - No cystic structure found in the popliteal fossa.  *See table(s) above for measurements and observations. Electronically signed by Harold Barban MD on 10/10/2020 at 9:57:39 PM.    Final     PHYSICAL EXAM Patient was not really willing to participate in physical exam.  PHYSICAL EXAM General: Resting in bed upright, flat affect HENT: Normal oropharynx and mucosa. Normal external appearance of ears and nose.  Neck: Supple, no pain or tenderness  CV:No JVD. No peripheral edema.  Pulmonary:Symmetric Chest rise. Normal respiratory effort.  Ext:No cyanosis, edema, or deformity Skin:No rash. Normal palpation of skin.  Musculoskeletal: Normal digits and nails by inspection. No clubbing.   Neurologic Examination  Mental status/Cognition:  Patient is alert. Patient is somewhat aware of the situation and reports recalling some of the conversation yesterday; however patient appears to perseverate on going home and is not really able to say much more than she wants to go home and rest. Patient is somewhat irritated by the shower cap on her head intermittently in conversation.   Speech/language: Fluent no aphasia noted. Basic comprehension intact.  Cranial nerves: CN II -  CN III,IV,VI EOM intact, no gaze preference or deviation, no nystagmus .   CN V -  CN VII no asymmetry, no nasolabial fold flattening   CN VIII normal hearing to speech   CN IX &X -  CN XI -  CN XII -   Motor: Spontaneous movement of BUE patient has legs curled up underneath her.    ASSESSMENT/PLAN Ms. EMOREE SASAKI is a 65 y.o. female with history of Cataracts, bilateral, Chickenpox (cataracts), COPD (chronic obstructive pulmonary disease) ([place]), Depression, Drug addiction ([place]), Headache, Hepatitis C virus infection, Hepatitis C without hepatic coma, History of chicken pox, History of colon  polyps, and Seizures (HCC)who presented with altered mental statusonday of admission. Patient initial MRI was negative for acute intracranial abnormality; however repeat 10/09/2020 noted an acute infarct of the L MCA. Patient currently undergoing stroke workup (etiology likely small vessel disease as chronic lacunar infarcts were also noted); however physical exam is not extremely concerning and the mild R sided weakness can be attributed to this infarct. Patient appeared to have more cognitive impairment today as she appeared to struggle with reasoning and understanding the pros and cons of CIR. patient endorsed understanding of not going to CIR, but when she spoke patient was never able to directly say state the concerns for why it was recommended she not return home instead she appeared to perseverate on " I need to rest at home." patient was pleasant but appeared to tired to really be able to have and understand the conversation of dispo at the time of this exam.  Neurology will sign off patient is stable from a neurological perspective as she does not have and focal neuro defecits, but would benefit from neuropsychology for further workup  for cognitive impairment.   Stroke: Anterior Left MCA Infarct, embolic pattern, source unclear  MRI brain left anterior MCA infarct  MRA head and neck negative  LE venous Doppler negative  Recommend 30 day cardioembolic monitoring  2D Echo : LVEF: 55 to 60%  LDL 136  HgbA1c 5.6  UDS + methadone (home meds)  VTE prophylaxis -  SCDs  No antithrombotic prior to admission, now on clopidogrel 75 mg daily. Allergy to ASA.  Therapy recommendations:  CIR recommended and accepted  Disposition:  Pending   Cognitive impairment  VitB12 deficiency  MMSE 12/30 per Dr. Rory Percy 10/08/2020  Lack of insight, disorientation, confabulation   B12 = 85, MMA elevated  On B12 supplement  Recommend outpt neuropsych evaluation if patient continues to refuse  CIR  Hyperlipidemia  LDL 136, goal < 70  Atorvastatin 40mg   Continue statin at discharge  Tobacco abuse  Current smoker  Smoking cessation counseling provided  Pt is willing to quit  Other Stroke Risk Factors  Migraines  ? Alcohol use, on FA/B1/MVI  Other Active Problems  Chronic pain - on methadone  Elevated TSH but normal FT4 and FT3  ? Hx of seizure  Hospital day # 11  Damita Dunnings, MD PGY-1  ATTENDING NOTE: I reviewed above note and agree with the assessment and plan. Pt was seen and examined.   Patient lying in bed, neuro stable, no acute change overnight.  MRA head and neck and LE venous Doppler unremarkable, no DVT, no LVO.  Recommend outpatient 30-day CardioNet monitoring to rule out A. fib.  Continue Plavix and statin.  Continue B12 supplement.  Also recommend outpatient neuropsych evaluation for cognitive impairment.  PT/OT recommend CIR.  For detailed assessment and plan, please refer to above as I have made changes wherever appropriate.   Neurology will sign off. Please call with questions. Pt will follow up with stroke clinic NP at [facility] in about 4 weeks. Thanks for the consult.   Rosalin Hawking, MD PhD Stroke Neurology 10/11/2020 7:13 PM    To contact Stroke Continuity provider, please refer to [email or web address] After hours, contact General Neurology

## 2020-10-11 NOTE — Discharge Summary (Signed)
Physician Discharge Summary  Natalie [name] SNK:[id number] DOB: September 21, 1955 DOA: 09/30/2020  PCP: Henrietta Hoover, MD  Admit date: 09/30/2020 Discharge date: 10/11/2020  Admitted From: Home. Disposition:  CIR  Recommendations for Outpatient Follow-up:  1. Follow up with PCP in 1-2 weeks 2. Please obtain BMP/CBC in one week 3. Please follow up with neurology in 2 to 4 weeks.  4. You will need follow up with Neuropsychology for cognitive evaluation.  5. Please follow up with cardiology for cardio embolic monitoring for 30 days.    Discharge Condition: stable.  CODE STATUS: full code.  Diet recommendation: Heart Healthy   Brief/Interim Summary: 65 year old lady prior history of COPD, depression hepatitis C viral infection presents with altered mental status.  MRI on admission negative for a repeat MRI on 10/09/2020 showed left MCA infarct.  Neurology on board.  Discharge Diagnoses:  Active Problems:   Respiratory failure (HCC)   Encephalopathy acute   Malnutrition of moderate degree   Cerebral thrombosis with cerebral infarction  Anterior left MCA infarct Evident on the repeat MRI.  MRA of the head and neck shows Left M2 superior division occlusion. Multiple missing distal right PCA branch vessels.  Neurology on board recommending 63-XTK cardioembolic monitoring. Echocardiogram showed left ventricular ejection fraction of 56% without any regional wall abnormalities.  LDL is 136, hemoglobin A1c at 5.6 Therapy evaluations recommending CIR.  SLP evaluation recommending dysphagia 1 diet with nectar thick fluids. Continue with Plavix 75 mg and statin 40 mg daily. Venous duplex of the lower extremity is negative for DVT.    History of substance abuse Currently on methadone.   GERD Continue with PPI.  Hyperlipidemia LDL is 136 and continue with statin.   COPD No wheezing heard on exam.   Discharge Instructions  Discharge Instructions    Diet - low sodium  heart healthy   Complete by: As directed    Diet - low sodium heart healthy   Complete by: As directed    Increase activity slowly   Complete by: As directed    Increase activity slowly   Complete by: As directed      Allergies as of 10/11/2020      Reactions   Sulfasalazine Shortness Of Breath   Aspirin    Darvon [propoxyphene]    Iodine    Penicillins    Sulfa Antibiotics    Zithromax [azithromycin]       Medication List    TAKE these medications   atorvastatin 40 MG tablet Commonly known as: LIPITOR Take 1 tablet (40 mg total) by mouth daily. Start taking on: October 12, 2020   clopidogrel 75 MG tablet Commonly known as: PLAVIX Take 1 tablet (75 mg total) by mouth daily. Start taking on: October 12, 2020   cyanocobalamin 1000 MCG tablet Take 1 tablet (1,000 mcg total) by mouth daily. Start taking on: October 13, 2407   folic acid 1 MG tablet Commonly known as: FOLVITE Take 1 tablet (1 mg total) by mouth daily. Start taking on: October 12, 2020   gabapentin 250 MG/5ML solution Commonly known as: NEURONTIN Take 2 mLs (100 mg total) by mouth at bedtime.   methadone 5 MG tablet Commonly known as: DOLOPHINE Take 5 tablets (25 mg total) by mouth every 8 (eight) hours.   pantoprazole sodium 40 mg/20 mL Pack Commonly known as: PROTONIX Take 20 mLs (40 mg total) by mouth daily at 12 noon. Start taking on: October 12, 2020   Resource ThickenUp Clear Powd Use  as needed.   thiamine 100 MG tablet Take 1 tablet (100 mg total) by mouth daily. Start taking on: October 12, 2020       Follow-up Information    Bynum, Erma Heritage Kudumu, MD. Schedule an appointment as soon as possible for a visit in 1 week(s).   Specialty: Family Medicine Contact information: [place] [address] [phone]              Allergies  Allergen Reactions  . Sulfasalazine Shortness Of Breath  . Aspirin   . Darvon [Propoxyphene]   . Iodine   . Penicillins   . Sulfa  Antibiotics   . Zithromax [Azithromycin]     Consultations:  neurology   Procedures/Studies: DG Orbits  Result Date: 10/21/2020 CLINICAL DATA:  MRI clearance. EXAM: ORBITS - COMPLETE 4+ VIEW COMPARISON:  Included portions from head CT earlier today. FINDINGS: There is no evidence of fracture or other significant bone abnormality. No orbital emphysema or sinus air-fluid levels are seen. IMPRESSION: Negative for metallic foreign body in the orbits. Electronically Signed   By: Keith Rake M.D.   On: October 21, 2020 17:37   DG Abd 1 View  Result Date: 2020/10/21 CLINICAL DATA:  Enteric catheter placement EXAM: ABDOMEN - 1 VIEW COMPARISON:  10/21/20 FINDINGS: Frontal view of the lower chest and upper abdomen demonstrates tip and side port of the enteric catheter projecting over the gastric fundus. The lung bases are clear. Bowel gas pattern is unremarkable. IMPRESSION: 1. Enteric catheter tip projecting over gastric fundus. Electronically Signed   By: Randa Ngo M.D.   On: October 21, 2020 21:56   DG Abdomen 1 View  Result Date: 21-Oct-2020 CLINICAL DATA:  MRI clearance EXAM: ABDOMEN - 1 VIEW COMPARISON:  None. FINDINGS: The bowel gas pattern is normal. No radio-opaque calculi or other significant radiographic abnormality are seen. Cholecystectomy clips in the right upper quadrant. Surgical clip in the right hemipelvis, which may reflect a dropped cholecystectomy clip or tubal ligation clip. IMPRESSION: 1. Cholecystectomy clips in the right upper quadrant. Surgical clip in the right hemipelvis, which may reflect a dropped cholecystectomy clip or tubal ligation clip. 2. Nonobstructive bowel gas pattern. Electronically Signed   By: Eddie Candle M.D.   On: 2020-10-21 17:38   CT HEAD WO CONTRAST  Result Date: 09/30/2020 CLINICAL DATA:  Seizure EXAM: CT HEAD WITHOUT CONTRAST TECHNIQUE: Contiguous axial images were obtained from the base of the skull through the vertex without intravenous contrast.  COMPARISON:  2020-10-21 FINDINGS: Brain: There is no acute intracranial hemorrhage, mass effect, or edema. Gray-white differentiation is preserved. There is no extra-axial fluid collection. Ventricles and sulci are stable in size and configuration. Patchy and confluent areas of hypoattenuation in the supratentorial with nonspecific but may reflect stable chronic microvascular ischemic changes greater than expected for age. 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 Vascular: No new findings. Skull: Calvarium is unremarkable. Sinuses/Orbits: No acute finding. Other: None. IMPRESSION: No acute intracranial abnormality or significant change since 10-21-2020. Electronically Signed   By: Macy Mis M.D.   On: 09/30/2020 14:55   CT HEAD WO CONTRAST  Result Date: 2020/10/21 CLINICAL DATA:  Recent seizure activity EXAM: CT HEAD WITHOUT CONTRAST TECHNIQUE: Contiguous axial images were obtained from the base of the skull through the vertex without intravenous contrast. COMPARISON:  CT from earlier in the same day. FINDINGS: Brain: No evidence of acute infarction, hemorrhage, hydrocephalus, extra-axial collection or mass lesion/mass effect. Old left thalamic lacunar infarct is again seen  and stable. Mild atrophic changes and chronic white matter ischemic changes are seen. Vascular: No hyperdense vessel or unexpected calcification. Skull: Normal. Negative for fracture or focal lesion. Sinuses/Orbits: No acute finding. Other: None. IMPRESSION: Chronic atrophic and ischemic changes without acute abnormality. No change from earlier in the same day. Electronically Signed   By: Inez Catalina M.D.   On: 09/28/2020 23:03   CT Head Wo Contrast  Result Date: 09/28/2020 CLINICAL DATA:  Altered level of consciousness EXAM: CT HEAD WITHOUT CONTRAST TECHNIQUE: Contiguous axial images were obtained from the base of the skull through the vertex without intravenous contrast. COMPARISON:  05/02/2012 FINDINGS: Brain:  Confluent hypodensities are again seen throughout the periventricular white matter, compatible with chronic small vessel ischemic changes. Focal hypodensity within the left thalamus is new since prior CT, but likely reflects a chronic lacunar infarct. No other signs of acute infarct or hemorrhage. Lateral ventricles and remaining midline structures are unremarkable. No acute extra-axial fluid collections. No mass effect. Vascular: Mild atherosclerosis of the internal carotid arteries. No hyperdense vessel. Skull: Normal. Negative for fracture or focal lesion. Sinuses/Orbits: No acute finding. Other: None. IMPRESSION: 1. Focal hypodensity left thalamus consistent with chronic lacunar infarct, new since previous CT. 2. Stable chronic small-vessel ischemic changes throughout the periventricular white matter. 3. No acute infarct or hemorrhage. Electronically Signed   By: Randa Ngo M.D.   On: 09/28/2020 16:00   CT Cervical Spine Wo Contrast  Result Date: 09/28/2020 CLINICAL DATA:  Altered level of consciousness, trauma EXAM: CT CERVICAL SPINE WITHOUT CONTRAST TECHNIQUE: Multidetector CT imaging of the cervical spine was performed without intravenous contrast. Multiplanar CT image reconstructions were also generated. COMPARISON:  None. FINDINGS: Alignment: Alignment is grossly anatomic. Skull base and vertebrae: No acute fracture. No primary bone lesion or focal pathologic process. Soft tissues and spinal canal: No prevertebral fluid or swelling. No visible canal hematoma. Disc levels: Prominent spondylosis at C5-6 and C6-7. Mild facet hypertrophy from C2 through C4. Symmetrical neural foraminal encroachment at C5-6. Upper chest: Airway is patent.  Lung apices are clear. Other: Reconstructed images demonstrate no additional findings. IMPRESSION: 1. No acute cervical spine fracture. 2. Multilevel spondylosis and facet hypertrophy, most pronounced at C5-6. Electronically Signed   By: Randa Ngo M.D.   On:  09/28/2020 16:02   MR ANGIO HEAD WO CONTRAST  Result Date: 10/10/2020 CLINICAL DATA:  Stroke follow-up. EXAM: MRA HEAD WITHOUT CONTRAST TECHNIQUE: Angiographic images of the Circle of Willis were obtained using MRA technique without intravenous contrast. COMPARISON:  None. FINDINGS: The visualized portions of the intracranial vertebral arteries are widely patent to the basilar. Patent left PICA, right AICA, and bilateral SCA origins are identified. The basilar artery is widely patent. Posterior communicating arteries are not clearly identified and may be diminutive or absent. Both PCAs are patent proximally without evidence of a significant P1 or P2 stenosis. There are missing right P3 and more distal branch vessels. The internal carotid arteries are widely patent from skull base to carotid termini. A1 and M1 segments are widely patent bilaterally. There is occlusion of the left M2 superior division 1 cm distal to the MCA bifurcation. No aneurysm is identified. IMPRESSION: 1. Left M2 superior division occlusion. 2. Multiple missing distal right PCA branch vessels. Electronically Signed   By: [place] Bores M.D.   On: 10/10/2020 17:03   MR ANGIO NECK W WO CONTRAST  Result Date: 10/10/2020 CLINICAL DATA:  Stroke follow-up. EXAM: MRA NECK WITHOUT AND WITH CONTRAST TECHNIQUE: Multiplanar and  multiecho pulse sequences of the neck were obtained without and with intravenous contrast. Angiographic images of the neck were obtained using MRA technique without and with intravenous contrast. CONTRAST:  5.110mL GADAVIST GADOBUTROL 1 MMOL/ML IV SOLN COMPARISON:  None. FINDINGS: There is a normal variant aortic arch branching pattern with common origin of the brachiocephalic and left common carotid arteries. There are mild stenoses of both subclavian arteries proximally. The common carotid and cervical internal carotid arteries are patent bilaterally without evidence of a dissection or significant stenosis. There is mild  irregularity of the mid and distal left common carotid artery which may reflect atherosclerosis resulting in less than 50% narrowing. The vertebral arteries are patent and codominant with antegrade flow bilaterally. No dissection or significant vertebral artery stenosis is identified. IMPRESSION: 1. No significant cervical carotid or vertebral artery stenosis. 2. Mild bilateral subclavian artery stenoses. Electronically Signed   By: [place] Bores M.D.   On: 10/10/2020 18:06   MR BRAIN WO CONTRAST  Result Date: 09/29/2020 CLINICAL DATA:  Mental status change, history of unknown drug use, seizures EXAM: MRI HEAD WITHOUT CONTRAST TECHNIQUE: Multiplanar, multiecho pulse sequences of the brain and surrounding structures were obtained without intravenous contrast. COMPARISON:  None. FINDINGS: Brain: There is no acute infarction or intracranial hemorrhage. There is no intracranial mass, mass effect, or edema. There is no hydrocephalus or extra-axial fluid collection. Ventricles and sulci are within normal limits in size and configuration. Patchy and confluent areas of T2 hyperintensity in the supratentorial and pontine white matter are nonspecific but may reflect age advanced chronic microvascular ischemic changes. There are chronic small vessel infarcts of the left thalamus and right lentiform nucleus. Vascular: Major vessel flow voids at the skull base are preserved. Skull and upper cervical spine: Normal marrow signal is preserved. Sinuses/Orbits: Paranasal sinuses are aerated. Orbits are unremarkable. Other: Sella is unremarkable.  Mastoid air cells are clear. IMPRESSION: No acute infarction, hemorrhage, or mass. Nonspecific white matter disease that may reflect age advanced chronic microvascular ischemic changes. 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 Electronically Signed   By: [place]acy Mis M.D.   On: 09/29/2020 14:05   MR BRAIN W WO CONTRAST  Addendum Date: 10/09/2020   ADDENDUM  REPORT: 10/09/2020 10:45 ADDENDUM: Study discussed by telephone with Neurology Dr. Rory Percy at 1033 hours on 10/09/2020. Electronically Signed   By: Genevie Ann M.D.   On: 10/09/2020 10:45   Result Date: 10/09/2020 CLINICAL DATA:  65 year old female with altered mental status. History of seizures. EXAM: MRI HEAD WITHOUT AND WITH CONTRAST TECHNIQUE: Multiplanar, multiecho pulse sequences of the brain and surrounding structures were obtained without and with intravenous contrast. CONTRAST:  46mL GADAVIST GADOBUTROL 1 MMOL/ML IV SOLN COMPARISON:  Brain MRI 09/29/2020. FINDINGS: Brain: Roughly 6 cm confluent area of restricted diffusion in the left anterior MCA territory (series 2, image 30) affecting the left insula, left inferior frontal gyrus, left temporal and frontal operculum. Cytotoxic edema and gyriform post ischemic enhancement. Mild associated multifocal petechial hemorrhage (series 7, image 65). No significant mass effect. No other restricted diffusion. Advanced bilateral cerebral white matter T2 and FLAIR hyperintensity in both hemispheres appears stable, including bilateral deep white matter capsule involvement. Chronic lacunar infarct of the left thalamus. Chronic lacunar infarcts versus perivascular spaces in the right lentiform. Patchy T2 and FLAIR hyperintensity in the pons. No chronic cortical encephalomalacia identified. No other intracranial blood products. No midline shift, mass effect, evidence of mass lesion, ventriculomegaly, extra-axial collection. Cervicomedullary junction and pituitary are  within normal limits. No other abnormal intracranial enhancement.  No dural thickening. Vascular: Major intracranial vascular flow voids are stable since last month. Pneumatized right anterior clinoid process. Major dural venous sinuses are enhancing and appear to be patent. Skull and upper cervical spine: Negative for age visible cervical spine. Visualized bone marrow signal is within normal limits. Sinuses/Orbits:  Stable, negative. Other: Trace mastoid fluid has increased. Negative visible nasopharynx. Visible internal auditory structures appear normal. Negative visible scalp and face. IMPRESSION: 1. Acute 6 cm infarct in the anterior left MCA territory is new from the MRI 09/29/2020. Multifocal mild petechial hemorrhage. No malignant hemorrhagic transformation or no mass effect. 2. Elsewhere stable MRI appearance of the brain since last month. Chronically advanced but nonspecific cerebral white matter disease. Chronic lacunar infarcts in the deep gray matter nuclei. Electronically Signed: By: Genevie Ann M.D. On: 10/09/2020 10:23   DG Chest Port 1 View  Result Date: 10/03/2020 CLINICAL DATA:  Respiratory failure EXAM: PORTABLE CHEST 1 VIEW COMPARISON:  None. FINDINGS: Endotracheal tube seen 6.3 cm above the carina. Nasogastric tube extends into the upper abdomen. The lungs are stably mildly hyperinflated. Superimposed mild interstitial infiltrate has developed within the left lung, possibly infectious or inflammatory. No pneumothorax or pleural effusion. Cardiac size within normal limits. Left internal jugular central venous catheter tip unchanged within the superior cavoatrial junction. No acute bone abnormality. IMPRESSION: Stable support lines and tubes. Stable pulmonary hyperinflation. Developing interstitial infiltrate within the left lung, possibly infectious or inflammatory. Electronically Signed   By: Fidela Salisbury MD   On: 10/03/2020 05:35   DG Chest Port 1 View  Result Date: 09/28/2020 CLINICAL DATA:  Intubated, central line placement EXAM: PORTABLE CHEST 1 VIEW COMPARISON:  09/28/2020 FINDINGS: Two frontal views of the chest demonstrate endotracheal tube overlying tracheal air column tip midway between thoracic inlet and carina. Enteric catheter passes below diaphragm tip overlying gastric fundus. There is a left internal jugular central venous catheter tip overlying the superior vena cava. Cardiac silhouette  is unremarkable. No airspace disease, effusion, or pneumothorax. External defibrillator pads overlie the chest. No acute bony abnormalities. IMPRESSION: 1. Support devices as above. 2. No acute intrathoracic process. Electronically Signed   By: Randa Ngo M.D.   On: 09/28/2020 21:55   DG Chest Portable 1 View  Result Date: 09/28/2020 CLINICAL DATA:  Shortness of breath in a 65 year old female. EXAM: PORTABLE CHEST 1 VIEW COMPARISON:  July 24, 2017 FINDINGS: Trachea midline. Cardiomediastinal contours with increased conspicuity of the main pulmonary artery along the LEFT cardiac border. Central pulmonary vasculature is engorged. Increased interstitial markings are present. No sign of lobar consolidation or evidence of pleural effusion. On limited assessment no acute skeletal process. IMPRESSION: 1. Central pulmonary vascular congestion and probable mild interstitial edema. 2. Marked prominence of the main pulmonary artery could be seen in the setting of pulmonary arterial hypertension. Prior imaging with signs of pulmonary arterial hypertension, this appears more prominent on today's study than on previous imaging. 3. No sign of lobar consolidation or pleural effusion. Electronically Signed   By: Zetta Bills M.D.   On: 09/28/2020 14:20   DG Swallowing Func-Speech Pathology  Result Date: 10/04/2020 Objective Swallowing Evaluation: Type of Study: MBS-Modified Barium Swallow Study  Patient Details Name: Natalie [name] MRN: [id number] Date of Birth: 19-Apr-1956 Today's Date: 10/04/2020 Time: SLP Start Time (ACUTE ONLY): 1340 -SLP Stop Time (ACUTE ONLY): 1355 SLP Time Calculation (min) (ACUTE ONLY): 15 min Past Medical History: Past Medical History: Diagnosis Date .  Cataracts, bilateral  . Chickenpox cataracts . COPD (chronic obstructive pulmonary disease) ([place])  . Depression  . Drug addiction ([place])  . Headache  . Hepatitis C virus infection  . Hepatitis C without hepatic coma  . History of chicken pox  .  History of colon polyps  . Seizures ([place])  Past Surgical History: Past Surgical History: Procedure Laterality Date . APPENDECTOMY   . CHOLECYSTECTOMY   . COLONOSCOPY WITH PROPOFOL N/A 04/30/2015  Procedure: COLONOSCOPY WITH PROPOFOL;  Surgeon: Josefine Class, MD;  Location: [facility] ENDOSCOPY;  Service: Endoscopy;  Laterality: N/A; . COLONOSCOPY WITH PROPOFOL N/A 07/13/2017  Procedure: COLONOSCOPY WITH PROPOFOL;  Surgeon: Manya Silvas, MD;  Location: [facility] ENDOSCOPY;  Service: Endoscopy;  Laterality: N/A; . ESOPHAGOGASTRODUODENOSCOPY (EGD) WITH PROPOFOL N/A 07/13/2017  Procedure: ESOPHAGOGASTRODUODENOSCOPY (EGD) WITH PROPOFOL;  Surgeon: Manya Silvas, MD;  Location: [facility] ENDOSCOPY;  Service: Endoscopy;  Laterality: N/A; . EYE SURGERY   HPI: 65 year old female with COPD and CKD stage IIIa who was referred from [facility] for evaluation of altered mental status, CT and MRi negative for acute change, LP suggesting of infection, started empirically on acyclovir. Then concern for seizure. Pt intubated from 3/27-3/30.  No data recorded Assessment / Plan / Recommendation CHL IP CLINICAL IMPRESSIONS 10/04/2020 Clinical Impression Pt demonstrates a mild oral dysphagia with decreased ability to masticate (unsure if pt uses dentures at baseline, edentulous during assessment) and pocketing of unmasticated solids. Pt has instances of silent aspiration just before adequate laryngeal closure is achieved with thin liquids regardless of bolus size. Cued cough ineffective. Pt could not achieve compensatory positions/strategies due to mentation. Nectar thick liquids are tolerated well and nectar and dys 2 diet is recommended. Will f/u. SLP Visit Diagnosis -- Attention and concentration deficit following -- Frontal lobe and executive function deficit following -- Impact on safety and function --   CHL IP TREATMENT RECOMMENDATION 10/03/2020 Treatment Recommendations Therapy as outlined in treatment plan below   Prognosis 10/04/2020  Prognosis for Safe Diet Advancement Good Barriers to Reach Goals -- Barriers/Prognosis Comment -- CHL IP DIET RECOMMENDATION 10/04/2020 SLP Diet Recommendations Dysphagia 2 (Fine chop) solids;Nectar thick liquid Liquid Administration via Cup;Straw Medication Administration Whole meds with liquid Compensations Slow rate;Small sips/bites Postural Changes Seated upright at 90 degrees   No flowsheet data found.  CHL IP FOLLOW UP RECOMMENDATIONS 10/04/2020 Follow up Recommendations Inpatient Rehab   CHL IP FREQUENCY AND DURATION 10/04/2020 Speech Therapy Frequency (ACUTE ONLY) min 2x/week Treatment Duration 2 weeks      CHL IP ORAL PHASE 10/04/2020 Oral Phase Impaired Oral - Pudding Teaspoon -- Oral - Pudding Cup -- Oral - Honey Teaspoon -- Oral - Honey Cup -- Oral - Nectar Teaspoon -- Oral - Nectar Cup Lingual/palatal residue Oral - Nectar Straw Lingual/palatal residue Oral - Thin Teaspoon -- Oral - Thin Cup Lingual/palatal residue Oral - Thin Straw Lingual/palatal residue Oral - Puree Lingual/palatal residue Oral - Mech Soft Lingual/palatal residue;Decreased bolus cohesion;Impaired mastication Oral - Regular -- Oral - Multi-Consistency -- Oral - Pill -- Oral Phase - Comment --  CHL IP PHARYNGEAL PHASE 10/04/2020 Pharyngeal Phase Impaired Pharyngeal- Pudding Teaspoon -- Pharyngeal -- Pharyngeal- Pudding Cup -- Pharyngeal -- Pharyngeal- Honey Teaspoon -- Pharyngeal -- Pharyngeal- Honey Cup -- Pharyngeal -- Pharyngeal- Nectar Teaspoon -- Pharyngeal -- Pharyngeal- Nectar Cup WFL Pharyngeal -- Pharyngeal- Nectar Straw WFL Pharyngeal -- Pharyngeal- Thin Teaspoon -- Pharyngeal -- Pharyngeal- Thin Cup Penetration/Aspiration before swallow Pharyngeal Material enters airway, passes BELOW cords without attempt by patient to eject out (  silent aspiration);Material does not enter airway Pharyngeal- Thin Straw Penetration/Aspiration before swallow;Trace aspiration Pharyngeal Material enters airway, passes BELOW cords without attempt by  patient to eject out (silent aspiration) Pharyngeal- Puree WFL Pharyngeal -- Pharyngeal- Mechanical Soft -- Pharyngeal -- Pharyngeal- Regular -- Pharyngeal -- Pharyngeal- Multi-consistency -- Pharyngeal -- Pharyngeal- Pill -- Pharyngeal -- Pharyngeal Comment --  No flowsheet data found. Herbie [place], MA CCC-SLP Acute Rehabilitation Services Pager [phone] Office [phone] Lynann Beaver 10/04/2020, 2:31 PM              EEG adult  Result Date: 09/29/2020 Greta Doom, MD     09/29/2020  2:56 PM History: 65 year old female being evaluated for seizures Sedation: Propofol 30 mcg/kg/min Technique: This is a 21 channel routine scalp EEG performed at the bedside with bipolar and monopolar montages arranged in accordance to the international 10/20 system of electrode placement. One channel was dedicated to EKG recording. Background: The background is highly disorganized consisting predominantly of generalized irregular slow activity.  There are frequent generalized periodic discharges with triphasic morphology with a shifting bifrontal predominance (triphasic waves).  There also brief runs of beta activity with a central predominance resembling sleep spindles. There are occasional brief suppressions of the background lasting less than 1 second. Photic stimulation: Physiologic driving is now performed EEG Abnormalities: 1) generalized periodic discharges with triphasic morphology (triphasic waves) 2) discontinuous disorganized background Clinical Interpretation: This EEG is consistent with a moderate to severe nonspecific generalized cerebral dysfunction (encephalopathy). There was no seizure or seizure predisposition recorded on this study. Please note that lack of epileptiform activity on EEG does not preclude the possibility of epilepsy. Roland Rack, MD [facility] [phone] If 7pm- 7am, please page neurology on call as listed in [place].   ECHOCARDIOGRAM  COMPLETE  Result Date: 10/10/2020    ECHOCARDIOGRAM REPORT   Patient Name:   EDINA WINNINGHAM Date of Exam: 10/10/2020 Medical Rec #:  [id number]   Height:       63.0 in Accession #:    [id number]  Weight:       114.6 lb Date of Birth:  [birth year]   BSA:          1.526 m Patient Age:    10 years    BP:           124/61 mmHg Patient Gender: F           HR:           89 bpm. Exam Location:  Inpatient Procedure: 2D Echo, 3D Echo, Cardiac Doppler, Color Doppler and Strain Analysis Indications:    Stroke I63.9  History:        Patient has no prior history of Echocardiogram examinations.                 COPD; Risk Factors:Current Smoker.  Sonographer:    Darlina Sicilian RDCS Referring Phys: [id number] ASHISH ARORA IMPRESSIONS  1. Left ventricular ejection fraction, by estimation, is 55 to 60%. Left ventricular ejection fraction by 3D volume is 56 %. The left ventricle has normal function. The left ventricle has no regional wall motion abnormalities. Left ventricular diastolic  parameters are consistent with Grade I diastolic dysfunction (impaired relaxation). The average left ventricular global longitudinal strain is -23.4 %. The global longitudinal strain is normal.  2. Right ventricular systolic function is normal. The right ventricular size is normal. Tricuspid regurgitation signal is inadequate for assessing PA pressure.  3. The mitral valve is abnormal. Trivial mitral valve  regurgitation.  4. The aortic valve is calcified. Aortic valve regurgitation is mild. Mild to moderate aortic valve sclerosis/calcification is present, without any evidence of aortic stenosis.  5. The inferior vena cava is normal in size with greater than 50% respiratory variability, suggesting right atrial pressure of 3 mmHg. Comparison(s): No prior Echocardiogram. Conclusion(s)/Recommendation(s): No intracardiac source of embolism detected on this transthoracic study. A transesophageal echocardiogram is recommended to exclude cardiac source of embolism if  clinically indicated. FINDINGS  Left Ventricle: Left ventricular ejection fraction, by estimation, is 55 to 60%. Left ventricular ejection fraction by 3D volume is 56 %. The left ventricle has normal function. The left ventricle has no regional wall motion abnormalities. The average left ventricular global longitudinal strain is -23.4 %. The global longitudinal strain is normal. The left ventricular internal cavity size was normal in size. There is no left ventricular hypertrophy. Left ventricular diastolic parameters are consistent  with Grade I diastolic dysfunction (impaired relaxation). Indeterminate filling pressures. Right Ventricle: The right ventricular size is normal. No increase in right ventricular wall thickness. Right ventricular systolic function is normal. Tricuspid regurgitation signal is inadequate for assessing PA pressure. Left Atrium: Left atrial size was normal in size. Right Atrium: Right atrial size was normal in size. Pericardium: There is no evidence of pericardial effusion. Mitral Valve: The mitral valve is abnormal. There is mild thickening of the mitral valve leaflet(s). Mild mitral annular calcification. Trivial mitral valve regurgitation. Tricuspid Valve: The tricuspid valve is grossly normal. Tricuspid valve regurgitation is trivial. Aortic Valve: The aortic valve is calcified. There is mild to moderate aortic valve annular calcification. Aortic valve regurgitation is mild. Aortic regurgitation PHT measures 342 msec. Mild to moderate aortic valve sclerosis/calcification is present, without any evidence of aortic stenosis. Pulmonic Valve: The pulmonic valve was grossly normal. Pulmonic valve regurgitation is trivial. Aorta: The aortic root and ascending aorta are structurally normal, with no evidence of dilitation. Venous: The inferior vena cava is normal in size with greater than 50% respiratory variability, suggesting right atrial pressure of 3 mmHg. IAS/Shunts: No atrial level shunt  detected by color flow Doppler.  LEFT VENTRICLE PLAX 2D LVIDd:         4.40 cm         Diastology LVIDs:         3.00 cm         LV e' medial:    6.20 cm/s LV PW:         0.70 cm         LV E/e' medial:  8.7 LV IVS:        0.90 cm         LV e' lateral:   7.29 cm/s LVOT diam:     2.00 cm         LV E/e' lateral: 7.4 LV SV:         58 LV SV Index:   38              2D LVOT Area:     3.14 cm        Longitudinal                                Strain                                2D Strain GLS  -  21.7 %                                (A2C):                                2D Strain GLS  -21.0 %                                (A3C):                                2D Strain GLS  -27.5 %                                (A4C):                                2D Strain GLS  -23.4 %                                Avg:                                 3D Volume EF                                LV 3D EF:    Left                                             ventricular                                             ejection                                             fraction by                                             3D volume                                             is 56 %.                                 3D Volume EF:  3D EF:        56 % RIGHT VENTRICLE RV S prime:     16.20 cm/s TAPSE (M-mode): 1.6 cm LEFT ATRIUM             Index       RIGHT ATRIUM          Index LA diam:        3.20 cm 2.10 cm/m  RA Area:     9.62 cm LA Vol (A2C):   21.4 ml 14.02 ml/m RA Volume:   18.50 ml 12.12 ml/m LA Vol (A4C):   17.8 ml 11.66 ml/m LA Biplane Vol: 20.5 ml 13.43 ml/m  AORTIC VALVE LVOT Vmax:   116.00 cm/s LVOT Vmean:  68.100 cm/s LVOT VTI:    0.186 m AI PHT:      342 msec  AORTA Ao Root diam: 2.70 cm MITRAL VALVE MV Area (PHT): 3.08 cm    SHUNTS MV Decel Time: 246 msec    Systemic VTI:  0.19 m MV E velocity: 54.00 cm/s  Systemic Diam: 2.00 cm MV A velocity: 58.30 cm/s MV E/A ratio:  0.93 Lyman Bishop MD Electronically signed by Lyman Bishop MD Signature Date/Time: 10/10/2020/10:50:09 AM    Final    VAS [place] LOWER EXTREMITY VENOUS (DVT)  Result Date: 10/10/2020  Lower Venous DVT Study Indications: Stroke.  Comparison Study: No prior study Performing Technologist: Maudry Mayhew MHA, RDMS, RVT, RDCS  Examination Guidelines: A complete evaluation includes B-mode imaging, spectral Doppler, color Doppler, and power Doppler as needed of all accessible portions of each vessel. Bilateral testing is considered an integral part of a complete examination. Limited examinations for reoccurring indications may be performed as noted. The reflux portion of the exam is performed with the patient in reverse Trendelenburg.  +---------+---------------+---------+-----------+----------+--------------+ RIGHT    CompressibilityPhasicitySpontaneityPropertiesThrombus Aging +---------+---------------+---------+-----------+----------+--------------+ CFV      Full           Yes      Yes                                 +---------+---------------+---------+-----------+----------+--------------+ SFJ      Full                                                        +---------+---------------+---------+-----------+----------+--------------+ FV Prox  Full                                                        +---------+---------------+---------+-----------+----------+--------------+ FV Mid   Full                                                        +---------+---------------+---------+-----------+----------+--------------+ FV DistalFull                                                        +---------+---------------+---------+-----------+----------+--------------+  PFV      Full                                                        +---------+---------------+---------+-----------+----------+--------------+ POP      Full           Yes      Yes                                  +---------+---------------+---------+-----------+----------+--------------+ PTV      Full                                                        +---------+---------------+---------+-----------+----------+--------------+ PERO     Full                                                        +---------+---------------+---------+-----------+----------+--------------+   +---------+---------------+---------+-----------+----------+--------------+ LEFT     CompressibilityPhasicitySpontaneityPropertiesThrombus Aging +---------+---------------+---------+-----------+----------+--------------+ CFV      Full           Yes      Yes                                 +---------+---------------+---------+-----------+----------+--------------+ SFJ      Full                                                        +---------+---------------+---------+-----------+----------+--------------+ FV Prox  Full                                                        +---------+---------------+---------+-----------+----------+--------------+ FV Mid   Full                                                        +---------+---------------+---------+-----------+----------+--------------+ FV DistalFull                                                        +---------+---------------+---------+-----------+----------+--------------+ PFV      Full                                                        +---------+---------------+---------+-----------+----------+--------------+  POP      Full           Yes      Yes                                 +---------+---------------+---------+-----------+----------+--------------+ PTV      Full                                                        +---------+---------------+---------+-----------+----------+--------------+ PERO     Full                                                         +---------+---------------+---------+-----------+----------+--------------+     Summary: RIGHT: - There is no evidence of deep vein thrombosis in the lower extremity.  - No cystic structure found in the popliteal fossa.  LEFT: - There is no evidence of deep vein thrombosis in the lower extremity.  - No cystic structure found in the popliteal fossa.  *See table(s) above for measurements and observations. Electronically signed by Harold Barban MD on 10/10/2020 at 9:57:39 PM.    Final       Subjective:  No new complaints.  Discharge Exam: Vitals:   10/11/20 0742 10/11/20 1103  BP: 132/76 111/67  Pulse: 63 67  Resp:    Temp: 97.6 F (36.4 C) 98.3 F (36.8 C)  SpO2: 92% 92%   Vitals:   10/10/20 2332 10/11/20 0413 10/11/20 0742 10/11/20 1103  BP: 111/76 136/68 132/76 111/67  Pulse: 85 66 63 67  Resp:  10    Temp: 98 F (36.7 C) 97.7 F (36.5 C) 97.6 F (36.4 C) 98.3 F (36.8 C)  TempSrc: Oral Axillary Oral Oral  SpO2: (!) 89%  92% 92%  Weight:        General: Pt is alert, awake, not in acute distress Cardiovascular: RRR, S1/S2 +, no rubs, no gallops Respiratory: CTA bilaterally, no wheezing, no rhonchi Abdominal: Soft, NT, ND, bowel sounds + Extremities: no edema, no cyanosis    The results of significant diagnostics from this hospitalization (including imaging, microbiology, ancillary and laboratory) are listed below for reference.     Microbiology: No results found for this or any previous visit (from the past 240 hour(s)).   Labs: BNP (last 3 results) No results for input(s): BNP in the last 8760 hours. Basic Metabolic Panel: Recent Labs  Lab 10/06/20 0240 10/07/20 0123 10/09/20 0705 10/10/20 0406  NA 137 138 139 141  K 5.0 4.0 3.7 4.0  CL 102 101 102 100  CO2 16* 31 30 32  GLUCOSE 91 85 102* 102*  BUN 27* 19 10 16   CREATININE 1.04* 1.06* 0.98 1.19*  CALCIUM 9.6 9.3 9.6 10.1  PHOS  --   --  4.5 5.3*   Liver Function Tests: Recent Labs  Lab  10/07/20 0123 10/09/20 0705 10/10/20 0406  AST 19  --   --   ALT 26  --   --   ALKPHOS 61  --   --   BILITOT 0.6  --   --   PROT 6.2*  --   --  ALBUMIN 3.2* 3.2* 3.4*   No results for input(s): LIPASE, AMYLASE in the last 168 hours. Recent Labs  Lab 10/06/20 1420  AMMONIA <9*   CBC: Recent Labs  Lab 10/07/20 0123  WBC 5.7  NEUTROABS 4.0  HGB 12.9  HCT 40.3  MCV 96.6  PLT 271   Cardiac Enzymes: No results for input(s): CKTOTAL, CKMB, CKMBINDEX, TROPONINI in the last 168 hours. BNP: Invalid input(s): POCBNP CBG: Recent Labs  Lab 10/05/20 0746 10/05/20 1149 10/07/20 0728 10/07/20 1112 10/08/20 0841  GLUCAP 123* 150* 115* 104* 131*   D-Dimer No results for input(s): DDIMER in the last 72 hours. Hgb A1c Recent Labs    10/10/20 0406  HGBA1C 5.6   Lipid Profile Recent Labs    10/10/20 0406  CHOL 207*  HDL 57  LDLCALC 136*  TRIG 69  CHOLHDL 3.6   Thyroid function studies No results for input(s): TSH, T4TOTAL, T3FREE, THYROIDAB in the last 72 hours.  Invalid input(s): FREET3 Anemia work up No results for input(s): VITAMINB12, FOLATE, FERRITIN, TIBC, IRON, RETICCTPCT in the last 72 hours. Urinalysis    Component Value Date/Time   COLORURINE STRAW (A) 09/28/2020 1555   APPEARANCEUR CLEAR (A) 09/28/2020 1555   APPEARANCEUR Clear 05/01/2012 0957   LABSPEC 1.008 09/28/2020 1555   LABSPEC 1.018 05/01/2012 0957   PHURINE 7.0 09/28/2020 1555   GLUCOSEU NEGATIVE 09/28/2020 1555   GLUCOSEU Negative 05/01/2012 0957   HGBUR SMALL (A) 09/28/2020 1555   BILIRUBINUR NEGATIVE 09/28/2020 1555   BILIRUBINUR Negative 05/01/2012 0957   KETONESUR NEGATIVE 09/28/2020 1555   PROTEINUR NEGATIVE 09/28/2020 1555   NITRITE NEGATIVE 09/28/2020 1555   LEUKOCYTESUR NEGATIVE 09/28/2020 1555   LEUKOCYTESUR Negative 05/01/2012 0957   Sepsis Labs Invalid input(s): PROCALCITONIN,  WBC,  LACTICIDVEN Microbiology No results found for this or any previous visit (from the  past 240 hour(s)).   Time coordinating discharge: 34 minutes.   SIGNED:   Hosie Poisson, MD  [facility] 10/11/2020, 12:46 PM

## 2020-10-11 NOTE — TOC Transition Note (Signed)
Transition of Care [facility]) - CM/SW Discharge Note   Patient Details  Name: Natalie [name] MRN: [id number] Date of Birth: 03-12-1956  [facility]) CM/SW Contact:  Pollie Friar, RN Phone Number: 10/11/2020, 1:23 PM   Clinical Narrative:    Patient is discharging to CIR today. CM signing off.   Final next level of care: IP Rehab Facility Barriers to Discharge: No Barriers Identified   Patient Goals and CMS Choice Patient states their goals for this hospitalization and ongoing recovery are:: patient unable to participate in goal setting, not fully oriented CMS Medicare.gov Compare Post Acute Care list provided to:: Patient Represenative (must comment) Choice offered to / list presented to : Adult Children  Discharge Placement                       Discharge Plan and Services     Post Acute Care Choice: IP Rehab                               Social Determinants of Health (SDOH) Interventions     Readmission Risk Interventions No flowsheet data found.

## 2020-10-11 NOTE — Progress Notes (Signed)
Inpatient Rehabilitation  Patient information reviewed and entered into eRehab system by Dagoberto Nealy M. Rashauna Tep, M.A., CCC/SLP, PPS Coordinator.  Information including medical coding, functional ability and quality indicators will be reviewed and updated through discharge.    

## 2020-10-11 NOTE — Progress Notes (Signed)
Patient ID: Natalie [name], female   DOB: [birth year], 65 y.o.   MRN: [id number] Patient admitted from [address] via wheelchair brought down by nursing. Transferred to bed stand by assist. Oriented to unit, fall packet, call light, and personal belongings at bedside. Angie Fava

## 2020-10-11 NOTE — Progress Notes (Signed)
IP rehab admissions - I met with patient and she wants to DC home and not come to CIR.  I called Mat, her son, and he will call patient to discuss rehab with patient.  Mat will call me back when we have a decision.  I will let you know when I hear back from patient's son.  Call me for questions.  [phone]

## 2020-10-11 NOTE — IPOC Note (Signed)
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) ([place])   Hypoalbuminemia due to protein-calorie malnutrition ([place])   Chronic pain syndrome   Dysphagia, post-stroke     Functional Problem List: Nursing Pain  PT Balance,Behavior,Endurance,Motor,Safety  OT Balance,Cognition,Endurance,Perception,Safety,Motor  SLP Cognition,Linguistic,Safety,Perception  TR         Basic ADL's: OT Grooming,Bathing,Dressing,Toileting     Advanced  ADL's: OT       Transfers: PT Bed Mobility,Bed to Chair,Car  OT Toilet,Tub/Shower     Locomotion: PT Ambulation,Stairs     Additional Impairments: OT None  SLP Swallowing,Communication expression,comprehension    TR      Anticipated Outcomes Item Anticipated Outcome  Self Feeding independent  Swallowing  mod I   Basic self-care  supervision  Toileting  supervision   Bathroom Transfers supervision  Bowel/Bladder  n/a  Transfers  supervision with LRAD (may reach mod I for transfers)  Locomotion  supervision with LRAD  Communication  minA  Cognition  supervisionA basic safety  Pain  Pain managed at or below level 4  Safety/Judgment  Maintain safety with cues/reminders   Therapy Plan: PT Intensity: Minimum of 1-2 x/day ,45 to 90 minutes PT Frequency: 5 out of 7 days PT Duration Estimated Length of Stay: 5-10 days OT Intensity: Minimum of 1-2 x/day, 45 to 90 minutes OT Frequency: 5 out of 7 days OT Duration/Estimated Length of Stay: 7-10 days SLP Intensity: Minumum of 1-2 x/day, 30 to 90 minutes SLP Frequency: 3 to 5 out of 7  days SLP Duration/Estimated Length of Stay: 7-10 days    Team Interventions: Nursing Interventions Patient/Family Education,Disease Management/Prevention,Medication Management,Cognitive Remediation/Compensation,Discharge Planning  PT interventions Ambulation/gait training,Discharge planning,Functional mobility training,Psychosocial support,Therapeutic Activities,Visual/perceptual remediation/compensation,Wheelchair propulsion/positioning,Therapeutic Exercise,Skin care/wound management,Neuromuscular re-education,Disease management/prevention,Balance/vestibular training,DME/adaptive equipment instruction,Splinting/orthotics,UE/LE Strength taining/ROM,Pain management,Cognitive remediation/compensation,Community reintegration,Patient/family education,Stair training,UE/LE Coordination activities  OT Interventions Balance/vestibular training,Discharge planning,Functional mobility training,DME/adaptive equipment instruction,Cognitive remediation/compensation,Patient/family education,Psychosocial support,Therapeutic Activities,Therapeutic Exercise,Self Care/advanced ADL retraining,UE/LE Strength taining/ROM,UE/LE Coordination activities,Visual/perceptual remediation/compensation  SLP Interventions Cognitive remediation/compensation,Functional tasks,Speech/Language facilitation,Patient/family education,Dysphagia/aspiration precaution training  TR Interventions    SW/CM Interventions Discharge Planning,Psychosocial Support,Patient/Family Education   Barriers to Discharge MD  Medical stability and Behavior  Nursing Decreased caregiver support,Home environment access/layout 2 level home with 5 step left rail entry and 14 step in house, ? son living with patient  PT Decreased caregiver support,Lack of/limited family support,Insurance for SNF coverage,Behavior    OT      SLP Decreased caregiver support patient reports that son will be living with her and he works from home, but this has not been confirmed  SW  Insurance underwriter for SNF coverage,Medication compliance BCBS will not cover SNF Aware of what medicaid covers   Team Discharge Planning: Destination: PT-Home ,OT- Home , SLP-Home Projected Follow-up: PT-Home health PT, OT-  Home health OT, SLP-Home Health SLP,24 hour supervision/assistance Projected Equipment Needs: PT-To be determined, OT- To be determined, SLP-None recommended by SLP Equipment Details: PT- , OT-  Patient/family involved in discharge planning: PT- Patient,  OT-Patient, SLP-Patient  MD ELOS: 6-8 days. 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  UDS positive for methadone and benzo's. CT head negative and MRI ordered but aborted due to generalized seizure activity not responsive to ativan and started on propofol. She had tachypnea and tachycardia and was intubated emergently. She was loaded with Keppra and antibiotics due to concerns of aspiration PNA.  Dr.Kirkpatrick expressed concerns of PRES but MRI brain negative for acute changes.  LP done to rule out aseptic/viral meningitis and acycovir added. She continued to be encephalopathic and was transferred to [facility] for LT-EEG monitoring.  EEG negative for seizures and showed metabolic encephalopathy and was found to have low folic BTDV/V61 levels. HSV PCR negative and acyclovir d/c. She tolerated extubation 03/30 and respiratory status stable. Keppra d/c per neurology input. 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  Stroke  work up initiated--LE dopplers negative and 30 day event monitor recommended by Dr. Erlinda Hong to rule out source of embolic stroke. Plavix added due to ASA allergy. She has had worsening of confusion with perseveration, confabulation, poor awareness of deficits as well as balance deficits with unsteady gait. Will set goals for Supervision with most taskt with PT/OT /SLP.   Due to the current state of emergency, patients may not be receiving their 3-hours of Medicare-mandated therapy.  See Team Conference Notes for weekly updates to the plan of care

## 2020-10-11 NOTE — Progress Notes (Signed)
IP rehab admissions - received call back from son, Natalie [name].  Patient is now agreeable to CIR and will admit to CIR today.  Call me for questions.  [phone]

## 2020-10-11 NOTE — Progress Notes (Signed)
Inpatient Rehabilitation Medication Review by a Pharmacist  A complete drug regimen review was completed for this patient to identify any potential clinically significant medication issues.  Clinically significant medication issues were identified:  No  Check AMION for pharmacist assigned to patient if future medication questions/issues arise during this admission.  Time spent performing this drug regimen review (minutes):  [place], PharmD, BCPS, [facility] Clinical Pharmacist 10/11/2020 3:39 PM

## 2020-10-12 ENCOUNTER — Other Ambulatory Visit: Payer: Self-pay | Admitting: Student

## 2020-10-12 DIAGNOSIS — N1831 Chronic kidney disease, stage 3a: Secondary | ICD-10-CM

## 2020-10-12 DIAGNOSIS — I63512 Cerebral infarction due to unspecified occlusion or stenosis of left middle cerebral artery: Secondary | ICD-10-CM

## 2020-10-12 DIAGNOSIS — E46 Unspecified protein-calorie malnutrition: Secondary | ICD-10-CM

## 2020-10-12 DIAGNOSIS — E8809 Other disorders of plasma-protein metabolism, not elsewhere classified: Secondary | ICD-10-CM

## 2020-10-12 DIAGNOSIS — I69391 Dysphagia following cerebral infarction: Secondary | ICD-10-CM

## 2020-10-12 DIAGNOSIS — G9340 Encephalopathy, unspecified: Secondary | ICD-10-CM

## 2020-10-12 DIAGNOSIS — G894 Chronic pain syndrome: Secondary | ICD-10-CM

## 2020-10-12 LAB — COMPREHENSIVE METABOLIC PANEL
ALT: 16 U/L (ref 0–44)
AST: 16 U/L (ref 15–41)
Albumin: 3.3 g/dL — ABNORMAL LOW (ref 3.5–5.0)
Alkaline Phosphatase: 66 U/L (ref 38–126)
Anion gap: 6 (ref 5–15)
BUN: 17 mg/dL (ref 8–23)
CO2: 32 mmol/L (ref 22–32)
Calcium: 9.6 mg/dL (ref 8.9–10.3)
Chloride: 101 mmol/L (ref 98–111)
Creatinine, Ser: 1.11 mg/dL — ABNORMAL HIGH (ref 0.44–1.00)
GFR, Estimated: 56 mL/min — ABNORMAL LOW (ref >60)
Glucose, Bld: 94 mg/dL (ref 70–99)
Potassium: 3.7 mmol/L (ref 3.5–5.1)
Sodium: 139 mmol/L (ref 135–145)
Total Bilirubin: 0.6 mg/dL (ref 0.3–1.2)
Total Protein: 6 g/dL — ABNORMAL LOW (ref 6.5–8.1)

## 2020-10-12 LAB — CBC WITH DIFFERENTIAL/PLATELET
Abs Immature Granulocytes: 0.02 10*3/uL (ref 0.00–0.07)
Basophils Absolute: 0 10*3/uL (ref 0.0–0.1)
Basophils Relative: 0 %
Eosinophils Absolute: 0 10*3/uL (ref 0.0–0.5)
Eosinophils Relative: 0 %
HCT: 36.9 % (ref 36.0–46.0)
Hemoglobin: 12.2 g/dL (ref 12.0–15.0)
Immature Granulocytes: 0 %
Lymphocytes Relative: 20 %
Lymphs Abs: 1.4 10*3/uL (ref 0.7–4.0)
MCH: 31.4 pg (ref 26.0–34.0)
MCHC: 33.1 g/dL (ref 30.0–36.0)
MCV: 94.9 fL (ref 80.0–100.0)
Monocytes Absolute: 0.5 10*3/uL (ref 0.1–1.0)
Monocytes Relative: 8 %
Neutro Abs: 4.8 10*3/uL (ref 1.7–7.7)
Neutrophils Relative %: 72 %
Platelets: 295 10*3/uL (ref 150–400)
RBC: 3.89 MIL/uL (ref 3.87–5.11)
RDW: 15.2 % (ref 11.5–15.5)
WBC: 6.8 10*3/uL (ref 4.0–10.5)
nRBC: 0 % (ref 0.0–0.2)

## 2020-10-12 LAB — METHYLMALONIC ACID, SERUM: Methylmalonic Acid, Quantitative: 311 nmol/L (ref 0–378)

## 2020-10-12 MED ORDER — PROSOURCE PLUS PO LIQD
30.0000 mL | Freq: Two times a day (BID) | ORAL | Status: DC
  Administered 2020-10-12 – 2020-10-19 (×13): 30 mL via ORAL
  Filled 2020-10-12 (×15): qty 30

## 2020-10-12 NOTE — Progress Notes (Addendum)
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 Cataracts, bilateral   . Chickenpox cataracts  . COPD (chronic obstructive pulmonary disease) ([place])   . Depression   . Drug addiction ([place])   . Headache   . Hepatitis C virus infection   . Hepatitis C without hepatic coma   . History of chicken pox   . History of colon polyps   . Seizures ([place])    Past Surgical History:  Past Surgical History:  Procedure Laterality Date  . APPENDECTOMY    . CHOLECYSTECTOMY    . COLONOSCOPY WITH PROPOFOL N/A 04/30/2015   Procedure: COLONOSCOPY WITH PROPOFOL;  Surgeon: Josefine Class, MD;  Location: [facility] ENDOSCOPY;  Service: Endoscopy;  Laterality: N/A;  . COLONOSCOPY WITH PROPOFOL N/A 07/13/2017   Procedure: COLONOSCOPY WITH PROPOFOL;  Surgeon: Manya Silvas, MD;  Location: [facility] ENDOSCOPY;  Service: Endoscopy;  Laterality: N/A;  . ESOPHAGOGASTRODUODENOSCOPY (EGD) WITH PROPOFOL N/A 07/13/2017   Procedure: ESOPHAGOGASTRODUODENOSCOPY (EGD) WITH PROPOFOL;  Surgeon: Manya Silvas, MD;  Location: [facility] ENDOSCOPY;  Service: Endoscopy;  Laterality: N/A;  . EYE SURGERY     Social History:  reports that she has been smoking cigarettes. She has a 20.50 pack-year smoking history. She has never used smokeless tobacco. She reports that she does not drink alcohol and does not use drugs.  Family / Support Systems Marital Status: Separated How Long?: BF passed away 2 months ago-separated from husband so he is nextr of kin and decision  maker Patient Roles: Parent Children: [name]-son [phone] Clayton-son [phone] Other Supports: Ex-husband covers her insurance Anticipated Caregiver: Rodman Key Ability/Limitations of Caregiver: Rodman Key works from home and is currently in the process of moving in with pt. Made aware she will need 24/7 care at [place] Caregiver Availability: 24/7 Family Dynamics: Close with two boys and ex-husband is still involved and carries her insurance. She has neighbors who look out for her and check on her.  Social History Preferred language: English Religion: [employer] Cultural Background: No issues Education: HS Read: Yes Write: Yes Employment Status: Disabled Public relations account executive Issues: No issues Guardian/Conservator: None-according to MD pt is not fully capable of making her own decisions while here. Since she is not legally divorced from husband the decision Delrae Sawyers goes to him. Rodman Key is aware of this   Abuse/Neglect Abuse/Neglect Assessment Can Be Completed: Yes Physical Abuse: Denies Verbal Abuse: Denies Sexual Abuse: Denies Exploitation of patient/patient's resources: Denies Self-Neglect: Denies  Emotional Status Pt's affect, behavior and adjustment status: Pt is wanting to discharge tomorrow, has no insight into her deficits. She feels she is moving well and can manage at home with her son there. She has always taken care of herself and feels can again. She is one who took care of others and boyfriend before he passed away Recent Psychosocial Issues: recent losses of boyfriend and pet-not taking care of herself Psychiatric History: History of depression and has been worse since passing of boyfriend two months ago. 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 Son can explain her issues and has spoken with the MD and feels his questions have been answered, but has many more he feels havn't asked yet. he writes everything down that is said Premorbid pt/family roles/activities: mom, neighbor, friend, etc Anticipated changes in roles/activities/participation: resume Pt/family expectations/goals: Pt states: " I plan to go home tomorrow. I don;t like hospitals."  Rodman Key states: " I hope she will do well there and will get the therapy she needs."  [employer]: Other (Comment) (Unsure where she ggets her methadone from) Premorbid Home Care/DME Agencies: Other (Comment) (has hospital bed, home O2, rw, bsc, tub seat, wc) Transportation available at discharge: Son can now living with Resource referrals recommended: Neuropsychology  Discharge Planning Living Arrangements: Children (son moving in with pt) Support Systems: Dealer Type of Residence: Private residence Insurance Resources: Multimedia programmer (specify),Medicaid (specify county) Nurse, mental health) Financial Resources: SSI Financial Screen Referred: No Living Expenses: Education officer, community Management: Patient Does the patient have any problems obtaining your medications?: No Home Management: Self, now son will do Patient/Family Preliminary Plans: Return home with son who is moving in to assist her. Son works from home and is still not clear what he will need to do for her and the long term plan. Discussed not an option to go SNF with BCBS and what the insurances will cover at home. Await team evaluations and come up with a safe plan for pt. Care Coordinator Barriers to Discharge: Insurance for SNF coverage,Medication compliance Care Coordinator Barriers to Discharge Comments: BCBS will not cover SNF Aware of what medicaid covers Care Coordinator Anticipated Follow Up  Needs: HH/OP  Clinical Impression Disoriented pt who is insisting on going home tomorrow. Pt has good movements in extremities but has cognitive deficits. Spoke with son-[name] who is moving in with pt to assist her. He works from home and can provide supervision. Discussed coverage for BCBS and medicaid. Made aware she will require 24/7 care due to cognition for safety. Await therapy evaluations. May need a family conference to discuss best plan for her. Messaged MD to call son to discuss methadone and if able to [facility] while here  Elease Hashimoto 10/12/2020, 10:18 AM

## 2020-10-12 NOTE — Evaluation (Signed)
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 Cataracts, bilateral   . Chickenpox cataracts  . COPD (chronic obstructive pulmonary disease) ([place])   . Depression   . Drug addiction ([place])   . Headache   . Hepatitis C virus infection   . Hepatitis C without hepatic coma   . History of chicken pox   . History of colon polyps   . Seizures ([place])    Past Surgical History:  Past Surgical History:  Procedure Laterality Date  . APPENDECTOMY    . CHOLECYSTECTOMY    . COLONOSCOPY WITH PROPOFOL N/A 04/30/2015   Procedure: COLONOSCOPY WITH PROPOFOL;  Surgeon: Josefine Class, MD;  Location: [facility] ENDOSCOPY;  Service: Endoscopy;  Laterality: N/A;  . COLONOSCOPY WITH PROPOFOL N/A 07/13/2017   Procedure: COLONOSCOPY WITH PROPOFOL;  Surgeon: Manya Silvas, MD;  Location: [facility] ENDOSCOPY;  Service: Endoscopy;  Laterality: N/A;  . ESOPHAGOGASTRODUODENOSCOPY (EGD) WITH PROPOFOL N/A 07/13/2017   Procedure: ESOPHAGOGASTRODUODENOSCOPY (EGD) WITH PROPOFOL;  Surgeon: Manya Silvas, MD;  Location: [facility] ENDOSCOPY;  Service: Endoscopy;  Laterality: N/A;  . EYE SURGERY      Assessment / Plan / Recommendation  [name]. Curb is a 65 year old female with history of COPD-  oxygen dependent, Hep C, remote seizure, CKD-III (Dr.Kolluru), frequent falls, chronic pain-on methadone, major depression (recent loss of partner and pet) who was admitted to [facility] with 09/28/20 after a neighbor found her confused with difficulty talking and covered in stool. UDS positive for methadone and benzo's. CT head negative and MRI ordered but aborted due to generalized seizure activity not responsive to ativan and started on propofol. She had tachypnea and tachycardia and was intubated emergently. She was loaded with Keppra and antibiotics due to concerns of aspiration PNA. Dr.Kirkpatrick expressed concerns of PRES but MRI brain negative for acute changes. LP done to rule out aseptic/viral meningitis and acycovir added. She continued to be encephalopathic and was transferred to [facility] for LT-EEG monitoring. EEG negative for seizures and showed metabolic encephalopathy and was found to have low folic TDVV/O16 levels. HSV PCR negative and acyclovir d/c.  She tolerated extubation 03/30 and respiratory status stable. Keppra d/c per neurology input. 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 Stroke work up initiated--LE dopplers negative and 30 day event monitor recommended by Dr. Erlinda Hong to rule out source of embolic stroke. Plavix added due to ASA allergy. She has had worsening of confusion with perseveration, confabulation, poor awareness of deficits as well as balance deficits with unsteady gait. CIR recommended due to functional decline. Patient transferred to CIR on 10/11/2020 .    Clinical  Impression Patient presents with a moderate expressive aphasia, mild-moderate receptive aphasia and mild cognitive impairment. Expressive aphasia presents as anomia, perseveration on words, semantic  paraphasias, hesitations and pauses during expression. Receptive language is influenced by her attention and cognitive processing, impairing her ability to follow multistep directions, respond to open ended questions. She does demonstrate awareness majority of the time when she perseverates and 70% of the time when she has a semantic error, however she is unable to repair errors, ie: perseverating on number, she stated her age as "46" then immediately saying, "no, Im not 39, come on [place]!" She could not perform divergent naming, was 6/8 accurate for confrontation naming of object pictures and she was able to name object function with 70% accuracy for basic level objects and she was 80% accurate for pointing to object pictures when function described in field of 10. Patient does not demonstrate adequate awareness to reason for hospitalization or adequate safety awareness related to her cognitive-linguistic impairments. She is anxious to return home. Patient will require skilled SLP intervention while in CIR to maximize receptive and expressive language abilities, cognitive abilities and swallow function prior to discharge.  Skilled Therapeutic Interventions          Bedside swallow evaluation, speech-language cognitive evaluation  SLP Assessment  Patient will need skilled [place] Pathology Services during CIR admission    Recommendations  SLP Diet Recommendations: Thin;Dysphagia 2 (Fine chop) Liquid Administration via: Cup;Straw Medication Administration: Whole meds with puree Supervision: Patient able to self feed Compensations: Slow rate;Small sips/bites;Minimize environmental distractions Postural Changes and/or Swallow Maneuvers: Seated upright 90 degrees Oral Care Recommendations: Oral care BID Patient destination: Home Follow up Recommendations: Home Health SLP;24 hour supervision/assistance Equipment Recommended: None recommended by SLP    SLP Frequency 3 to 5 out of 7 days   SLP  Duration  SLP Intensity  SLP Treatment/Interventions 7-10 days  Minumum of 1-2 x/day, 30 to 90 minutes  Cognitive remediation/compensation;Functional tasks;Speech/Language facilitation;Patient/family education;Dysphagia/aspiration precaution training    Pain Pain Assessment Pain Scale: 0-10 Pain Score: 0-No pain  Prior Functioning Cognitive/Linguistic Baseline: Information not available Type of Home: House  Lives With: Alone Available Help at Discharge: Family;Available 24 hours/day Vocation: On disability  SLP Evaluation Cognition Overall Cognitive Status: Impaired/Different from baseline Arousal/Alertness: Awake/alert Orientation Level: Oriented to person;Oriented to place;Disoriented to time;Disoriented to situation Attention: Sustained Focused Attention: Appears intact Sustained Attention: Impaired Sustained Attention Impairment: Verbal basic Memory: Impaired Memory Impairment: Decreased recall of new information;Retrieval deficit;Storage deficit Decreased Short Term Memory: Verbal basic Awareness: Impaired Awareness Impairment: Intellectual impairment Problem Solving: Impaired Problem Solving Impairment: Verbal basic;Functional basic Executive Function: Self Correcting Sequencing: Impaired Sequencing Impairment: Functional basic Self Correcting: Impaired Self Correcting Impairment: Verbal basic Behaviors: Perseveration Safety/Judgment: Impaired  Comprehension Auditory Comprehension Overall Auditory Comprehension: Impaired Yes/No Questions: Impaired Basic Immediate Environment Questions: 25-49% accurate Commands: Impaired One Step Basic Commands: 50-74% accurate Conversation: Simple Interfering Components: Other (comment) (perseveration) EffectiveTechniques: Repetition Visual Recognition/Discrimination Discrimination: Not tested Reading Comprehension Reading Status: Not tested Expression Expression Primary Mode of Expression: Verbal Verbal  Expression Overall Verbal Expression: Impaired Initiation: No impairment Level of Generative/Spontaneous Verbalization: Phrase Repetition: No impairment Naming: Impairment Responsive: Not tested Confrontation: Impaired Divergent: 0-24% accurate Other Naming Comments: inconsistent perserveration but able to name 6/8 object pictures and did demonstrate awareness to errors but unable to correct Verbal Errors: Perseveration;Aware of errors;Other (comment);Semantic paraphasias (aware of errors majority of the time but unable to correct) Pragmatics: Impairment Impairments: Topic maintenance;Abnormal affect Interfering Components: Attention Effective  Techniques: Semantic cues;Open ended questions Non-Verbal Means of Communication: Not applicable Written Expression Dominant Hand: Right Written Expression: Not tested Oral Motor Oral Motor/Sensory Function Overall Oral Motor/Sensory Function: Within functional limits Motor Speech Overall Motor Speech: Appears within functional limits for tasks assessed  Care Tool Care Tool Cognition Expression of Ideas and Wants Expression of Ideas and Wants: Some difficulty - exhibits some difficulty with expressing needs and ideas (e.g, some words or finishing thoughts) or speech is not clear   Understanding Verbal and Non-Verbal Content Understanding Verbal and Non-Verbal Content: Sometimes understands - understands only basic conversations or simple, direct phrases. Frequently requires cues to understand   Memory/Recall Ability *first 3 days only Memory/Recall Ability *first 3 days only: That he or she is in a hospital/hospital unit     Bedside Swallowing Assessment General Date of Onset: 09/28/20 Previous Swallow Assessment: BSE and MBS 3/31 Diet Prior to this Study: Dysphagia 1 (puree);Thin liquids Temperature Spikes Noted: No Respiratory Status: Room air History of Recent Intubation: Yes Length of Intubations (days): 4 days Date extubated:  10/03/20 Behavior/Cognition: Cooperative;Pleasant mood;Alert Oral Cavity - Dentition: Edentulous;Dentures, top Self-Feeding Abilities: Able to feed self Vision: Functional for self-feeding Patient Positioning: Upright in bed Baseline Vocal Quality: Normal Volitional Cough: Strong Volitional Swallow: Unable to elicit  Oral Care Assessment   Ice Chips Ice chips: Not tested Thin Liquid Thin Liquid: Within functional limits Presentation: Straw Nectar Thick   Honey Thick   Puree Puree: Not tested Solid Solid: Impaired Oral Phase Functional Implications: Impaired mastication;Prolonged oral transit BSE Assessment Risk for Aspiration Impact on safety and function: Mild aspiration risk Other Related Risk Factors: Prolonged intubation;Cognitive impairment  Short Term Goals: Week 1: SLP Short Term Goal 1 (Week 1): Patient will demonstrate intellectual awareness to her deficits in cognition and language with modA. SLP Short Term Goal 2 (Week 1): Patient will repair errors in naming/describing and semantic errors during structured language tasks with min-modA cues. SLP Short Term Goal 3 (Week 1): Patient will tolerate current diet (Dys 2 solids) and thin liquids as well as trials of Dys 3 solids without overt s/s aspiration or penetration. SLP Short Term Goal 4 (Week 1): Patient will perform divergent and convergent naming with modA cues. SLP Short Term Goal 5 (Week 1): Patient will identify absurdities and problems in pictures/photos and describe at least one solution, with min-modA  Refer to Care Plan for Long Term Goals  Recommendations for other services: Neuropsych  Discharge Criteria: Patient will be discharged from SLP if patient refuses treatment 3 consecutive times without medical reason, if treatment goals not met, if there is a change in medical status, if patient makes no progress towards goals or if patient is discharged from hospital.  The above assessment, treatment plan,  treatment alternatives and goals were discussed and mutually agreed upon: by patient  Sonia Baller, MA, CCC-SLP Speech Therapy

## 2020-10-12 NOTE — Progress Notes (Signed)
Patient ID: Natalie [name], female   DOB: [birth year], 65 y.o.   MRN: [id number] Spoke with pt's sister Jeani Hawking to answer her questions. Let her know pt will need 24/7 supervision for safety due to cognitive deficits. Also will be short length of stay. Message from husband-John and have called back and left message for him. He is wanting information

## 2020-10-12 NOTE — Evaluation (Signed)
Physical Therapy Assessment and Plan  Patient Details  Name: Natalie [name] MRN: [id number] Date of Birth: [birth year]  PT Diagnosis: Abnormality of gait, Cognitive deficits, Difficulty walking, Impaired cognition and Muscle weakness Rehab Potential: Good ELOS: 5-10 days   Today's Date: 10/12/2020 PT Individual Time: 1300-1356 PT Individual Time Calculation (min): 56 min    Hospital Problem: Principal Problem:   Acute ischemic left MCA stroke (HCC) Active Problems:   CKD (chronic kidney disease) stage 3, GFR 30-59 ml/min (HCC)   COPD (chronic obstructive pulmonary disease) (HCC)   Encephalopathy   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 Cataracts, bilateral   . Chickenpox cataracts  . COPD (chronic obstructive pulmonary disease) (HCC)   . Depression   . Drug addiction (HCC)   . Headache   . Hepatitis C virus infection   . Hepatitis C without hepatic coma   . History of chicken pox   . History of colon polyps   . Seizures (HCC)    Past Surgical History:  Past Surgical History:  Procedure Laterality Date  . APPENDECTOMY    . CHOLECYSTECTOMY    . COLONOSCOPY WITH PROPOFOL N/A 04/30/2015   Procedure: COLONOSCOPY WITH PROPOFOL;  Surgeon: Elnita Maxwell, MD;  Location: [facility] ENDOSCOPY;  Service: Endoscopy;  Laterality: N/A;  . COLONOSCOPY WITH PROPOFOL N/A 07/13/2017   Procedure: COLONOSCOPY WITH PROPOFOL;  Surgeon: Scot Jun, MD;  Location: [facility] ENDOSCOPY;  Service: Endoscopy;  Laterality: N/A;  . ESOPHAGOGASTRODUODENOSCOPY (EGD) WITH PROPOFOL N/A 07/13/2017   Procedure: ESOPHAGOGASTRODUODENOSCOPY (EGD) WITH PROPOFOL;  Surgeon: Scot Jun, MD;  Location: [facility] ENDOSCOPY;  Service: Endoscopy;  Laterality: N/A;  . EYE SURGERY      Assessment & Plan Clinical Impression:  Patient is a 65 year old female with history of COPD- oxygen dependent, Hep C, remote seizure, CKD-III (Dr.Kolluru), frequent falls, chronic pain-on methadone, major depression (recent loss of partner and pet) who was admitted to [facility] with 09/28/20 after a neighbor found her confused with difficulty talking and covered in stool.  UDS positive for methadone and benzo's. CT head negative and MRI ordered but aborted due to generalized seizure activity not responsive to ativan and started on propofol. She had tachypnea and tachycardia and was intubated emergently. She was loaded with Keppra and antibiotics due to concerns of aspiration PNA.  Dr.Kirkpatrick expressed concerns of PRES but MRI brain negative for acute changes.  LP done to rule out aseptic/viral meningitis and acycovir added. She continued to be encephalopathic and was transferred to [facility] for LT-EEG monitoring.  EEG negative for seizures and showed metabolic encephalopathy and was found to have low folic acid/B12 levels. HSV PCR negative and acyclovir d/c.  She tolerated extubation 03/30 and respiratory status stable. Keppra d/c per neurology input. 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  Stroke work up initiated--LE dopplers negative and 30 day event monitor recommended by Dr. Roda Shutters to rule out source of embolic stroke. Plavix added due to ASA allergy. She has had worsening of confusion with perseveration, confabulation, poor awareness of deficits as well as balance deficits with unsteady gait. CIR recommended due to functional decline.  Patient transferred to CIR on 10/11/2020 .   Patient currently requires min with mobility secondary to muscle weakness, decreased coordination and decreased motor planning and decreased awareness, decreased  problem solving, decreased safety awareness and decreased memory.  Prior to hospitalization, patient was modified independent  with mobility and lived with Alone in a House home.  Home access is 5Stairs to enter.  Patient will benefit from skilled PT intervention to maximize safe functional mobility, minimize fall risk and decrease caregiver burden for planned discharge home with 24 hour supervision.  Anticipate patient will benefit from follow up [place] at discharge.  PT - End of Session Activity Tolerance: Tolerates 30+ min activity with multiple rests Endurance Deficit: Yes Endurance Deficit Description: Tolerated activity on room air, SpO2 >88% throughout. Benefited from brief seated rest breaks b/w functional mobility tasks for recovery PT Assessment Rehab Potential (ACUTE/IP ONLY): Good PT Barriers to Discharge: Decreased caregiver support;Lack of/limited family support;Insurance for SNF coverage;Behavior PT Patient demonstrates impairments in the following area(s): Balance;Behavior;Endurance;Motor;Safety PT Transfers Functional Problem(s): Bed Mobility;Bed to Chair;Car PT Locomotion Functional Problem(s): Ambulation;Stairs PT Plan PT Intensity: Minimum of 1-2 x/day ,45 to 90 minutes PT Frequency: 5 out of 7 days PT Duration Estimated Length of Stay: 5-10 days PT Treatment/Interventions: Ambulation/gait training;Discharge planning;Functional mobility training;Psychosocial support;Therapeutic Activities;Visual/perceptual remediation/compensation;Wheelchair propulsion/positioning;Therapeutic Exercise;Skin care/wound management;Neuromuscular re-education;Disease management/prevention;Balance/vestibular training;DME/adaptive equipment instruction;Splinting/orthotics;UE/LE Strength taining/ROM;Pain management;Cognitive remediation/compensation;Community reintegration;Patient/family education;Stair training;UE/LE Coordination activities PT Transfers Anticipated Outcome(s): supervision with LRAD (may  reach mod I for transfers) PT Locomotion Anticipated Outcome(s): supervision with LRAD PT Recommendation Recommendations for Other Services: Neuropsych consult Follow Up Recommendations: Home health PT Patient destination: Home Equipment Recommended: To be determined   PT Evaluation Precautions/Restrictions Precautions Precautions: Fall Precaution Comments: seizures Restrictions Weight Bearing Restrictions: No General Chart Reviewed: Yes Additional Pertinent History: history of COPD- oxygen dependent, Hep C, remote seizure, CKD-III (Dr.Kolluru), frequent falls, chronic pain-on methadone, major depression (recent loss of partner and pet) Family/Caregiver Present: No    Home Living/Prior Functioning Home Living Available Help at Discharge: Family;Available 24 hours/day Type of Home: House Home Access: Stairs to enter [employer] of Steps: 5 Entrance Stairs-Rails: Left Home Layout: Two level;Bed/bath upstairs Alternate Level Stairs-Number of Steps: 14 Alternate Level Stairs-Rails: Right Bathroom Shower/Tub: Tub/shower unit;Curtain Bathroom Toilet: Standard Additional Comments: Unsure how much is accurate, pt is poor historian. She states her son has been living with her, even before her significant other passed away.  Lives With: Alone Prior Function Level of Independence: Independent with basic ADLs;Independent with homemaking with ambulation;Independent with gait  Able to Take Stairs?: Yes Driving: Yes Vocation: On disability Comments: Pt with conflicting responses during PLOF and social factors assessment - one moment she reports she lives in an apartment, then she reports she lives with her son in a mobile home. Poor historian Vision/Perception  Vision - Assessment Additional Comments: unable to have pt follow directions for specific visual assessment Perception Perception: Impaired Inattention/Neglect: Does not attend to right visual field;Does not attend to  right side of body Praxis Praxis: Impaired Praxis Impairment Details: Ideomotor;Motor planning;Ideation Praxis-Other Comments: functional for basic familiar self care tasks; has difficulty with new motor tasks (comprehension challenges?)  Cognition Overall Cognitive Status: Impaired/Different from baseline Arousal/Alertness: Awake/alert Orientation Level: Oriented to person;Oriented to place;Disoriented to time;Disoriented to situation Focused Attention: Appears intact Sustained Attention: Impaired Sustained Attention Impairment: Verbal basic Memory: Impaired Memory Impairment: Storage deficit;Retrieval deficit;Decreased recall of new information;Decreased short term memory Decreased Short Term Memory: Verbal basic;Functional basic Immediate Memory Recall: Sock;Blue;Bed Memory  Recall Sock: Not able to recall Memory Recall Blue: Not able to recall Memory Recall Bed: Not able to recall Awareness: Impaired Awareness Impairment: Intellectual impairment;Emergent impairment Problem Solving: Impaired Problem Solving Impairment: Verbal basic;Functional basic Executive Function: Sequencing;Organizing Sequencing: Impaired Sequencing Impairment: Music therapist: Impaired Organizing Impairment: Functional basic Behaviors: Perseveration (will repeat the last thing told to her, ie. kept repeating sock blue bed when asked about the year, month, etc) Safety/Judgment: Impaired Sensation Sensation Light Touch: Appears Intact Hot/Cold: Appears Intact Proprioception: Appears Intact Stereognosis: Not tested Coordination Gross Motor Movements are Fluid and Coordinated: No Fine Motor Movements are Fluid and Coordinated: Yes Coordination and Movement Description: generalized dysmetria,decreased awareness of body position with following visual directions Finger Nose Finger Test: unable to follow directions of test Heel Shin Test: UTA due to cog deficits and inability to sequence movement  patterns. No dyscoordination with functional mobility noted. Motor  Motor Motor: Other (comment) Motor - Skilled Clinical Observations: No focal neuro deficits. Strength symmetrical. Generalized weakness and deconditioning present   Trunk/Postural Assessment  Cervical Assessment Cervical Assessment: Within Functional Limits Thoracic Assessment Thoracic Assessment: Exceptions to [facility] (rounded shoulders) Lumbar Assessment Lumbar Assessment: Exceptions to [facility] (posterior pelvic tilt) Postural Control Postural Control: Deficits on evaluation Trunk Control: Delayed - wanting to have UE support in standing  Balance Balance Balance Assessed: Yes Static Sitting Balance Static Sitting - Level of Assistance: 5: Stand by assistance Dynamic Sitting Balance Dynamic Sitting - Level of Assistance: 5: Stand by assistance Static Standing Balance Static Standing - Level of Assistance: 4: Min assist (CGA) Dynamic Standing Balance Dynamic Standing - Level of Assistance: 4: Min assist Extremity Assessment  RUE Assessment RUE Assessment: Within Functional Limits General Strength Comments: slight weakness in R shoulder, but does not impede function LUE Assessment LUE Assessment: Within Functional Limits RLE Assessment RLE Assessment: Within Functional Limits General Strength Comments: Grossly 4+/5 LLE Assessment LLE Assessment: Within Functional Limits General Strength Comments: Grossly 4+/5  Care Tool Care Tool Bed Mobility Roll left and right activity   Roll left and right assist level: Independent    Sit to lying activity   Sit to lying assist level: Supervision/Verbal cueing    Lying to sitting edge of bed activity   Lying to sitting edge of bed assist level: Supervision/Verbal cueing     Care Tool Transfers Sit to stand transfer   Sit to stand assist level: Supervision/Verbal cueing    Chair/bed transfer   Chair/bed transfer assist level: Contact Guard/Touching assist      Toilet transfer   Assist Level: Contact Guard/Touching assist    Car transfer   Car transfer assist level: Contact Guard/Touching assist      Care Tool Locomotion Ambulation   Assist level: Contact Guard/Touching assist Assistive device: Walker-rolling Max distance: 155ft  Walk 10 feet activity   Assist level: Contact Guard/Touching assist Assistive device: Walker-rolling   Walk 50 feet with 2 turns activity   Assist level: Contact Guard/Touching assist Assistive device: Walker-rolling  Walk 150 feet activity   Assist level: Contact Guard/Touching assist Assistive device: Walker-rolling  Walk 10 feet on uneven surfaces activity   Assist level: Contact Guard/Touching assist Assistive device: Walker-rolling  Stairs   Assist level: Contact Guard/Touching assist Stairs assistive device: 2 hand rails Max number of stairs: 12  Walk up/down 1 step activity   Walk up/down 1 step (curb) assist level: Contact Guard/Touching assist Walk up/down 1 step or curb assistive device: 2 hand rails    Walk up/down 4  steps activity Walk up/down 4 steps assist level: Contact Guard/Touching assist Walk up/down 4 steps assistive device: 2 hand rails  Walk up/down 12 steps activity   Walk up/down 12 steps assist level: Contact Guard/Touching assist Walk up/down 12 steps assistive device: 2 hand rails  Pick up small objects from floor   Pick up small object from the floor assist level: Contact Guard/Touching assist    Wheelchair Will patient use wheelchair at discharge?: No   Wheelchair activity did not occur: N/A      Wheel 50 feet with 2 turns activity Wheelchair 50 feet with 2 turns activity did not occur: N/A    Wheel 150 feet activity Wheelchair 150 feet activity did not occur: N/A      Refer to Care Plan for Long Term Goals  SHORT TERM GOAL WEEK 1 PT Short Term Goal 1 (Week 1): STG = LTG due to ELOS  Recommendations for other services: Neuropsych  Skilled Therapeutic  Intervention Mobility Bed Mobility Bed Mobility: Rolling Right;Rolling Left;Supine to Sit;Sit to Supine Rolling Right: Independent Rolling Left: Independent Supine to Sit: Supervision/Verbal cueing Sit to Supine: Supervision/Verbal cueing Transfers Transfers: Sit to Stand;Stand to Sit;Stand Pivot Transfers Sit to Stand: Contact Guard/Touching assist Stand to Sit: Contact Guard/Touching assist Stand Pivot Transfers: Contact Guard/Touching assist Transfer (Assistive device): Rolling walker Locomotion  Gait Ambulation: Yes Gait Assistance: Contact Guard/Touching assist Gait Distance (Feet): 165 Feet Assistive device: Rolling walker Gait Gait: Yes Gait Pattern: Impaired Gait Pattern: Step-through pattern;Poor foot clearance - right;Poor foot clearance - left;Decreased step length - left;Decreased step length - right;Trunk flexed;Narrow base of support Gait velocity: Decreased Stairs / Additional Locomotion Stairs: Yes Stairs Assistance: Contact Guard/Touching assist Stair Management Technique: Two rails Number of Stairs: 12 Height of Stairs: 6 (inches) Wheelchair Mobility Wheelchair Mobility: No  Skilled Intervention: Pt greeted supine in bed, agreeable to therapy. No reports of pain. Pt is pleasant and cooperative throughout session. Pt unable to recall prior therapy sessions this morning. Oriented to self, location only. PLOF and social factors questionable due to impaired cognition and unreliable and inconsistent responses - will need to confirm with family regarding home setup and amount of supervision available at DC. Initiated functional mobility as outlined above. SpO2 >88% on RA throughout session, including ambulation and stair training. Pt requires supervision for bed mobility, CGA for transfers with RW, and gait ~122ft with CGA and RW. She's able to go up/down x12 steps with CGA and 2 hand rails via self selected reciprocal pattern without LOB or knee buckling. Gait training  124ft with CGA and no AD, noted shortening step length and increased lateral unsteadiness. Pt ended session supine in bed with needs in reach, in [place] bed with telesitter on.  Instructed pt in results of PT evaluation as detailed above, PT POC, rehab potential, rehab goals, and discharge recommendations. Additionally discussed CIR's policies regarding fall safety and use of chair alarm and/or quick release belt. Pt verbalized understanding and in agreement. Will update pt's family members as they become available.   Discharge Criteria: Patient will be discharged from PT if patient refuses treatment 3 consecutive times without medical reason, if treatment goals not met, if there is a change in medical status, if patient makes no progress towards goals or if patient is discharged from hospital.  The above assessment, treatment plan, treatment alternatives and goals were discussed and mutually agreed upon: by patient  Alger Simons PT, DPT 10/12/2020, 3:27 PM

## 2020-10-12 NOTE — Evaluation (Signed)
Occupational Therapy Assessment and Plan  Patient Details  Name: Natalie [name] MRN: [id number] Date of Birth: 1955-08-28  OT Diagnosis: cognitive deficits and muscle weakness (generalized), perceptual and balance deficits Rehab Potential: Rehab Potential (ACUTE ONLY): Good ELOS: 7-10 days   Today's Date: 10/12/2020 OT Individual Time: 1100-1200 OT Individual Time Calculation (min): 60 min     Hospital Problem: Principal Problem:   Acute ischemic left MCA stroke ([place]) Active Problems:   CKD (chronic kidney disease) stage 3, GFR 30-59 ml/min (HCC)   COPD (chronic obstructive pulmonary disease) (HCC)   Encephalopathy   Aphasia due to acute cerebrovascular accident (CVA) ([place])   Hypoalbuminemia due to protein-calorie malnutrition ([place])   Chronic pain syndrome   Dysphagia, post-stroke   Past Medical History:  Past Medical History:  Diagnosis Date  . Cataracts, bilateral   . Chickenpox cataracts  . COPD (chronic obstructive pulmonary disease) ([place])   . Depression   . Drug addiction ([place])   . Headache   . Hepatitis C virus infection   . Hepatitis C without hepatic coma   . History of chicken pox   . History of colon polyps   . Seizures ([place])    Past Surgical History:  Past Surgical History:  Procedure Laterality Date  . APPENDECTOMY    . CHOLECYSTECTOMY    . COLONOSCOPY WITH PROPOFOL N/A 04/30/2015   Procedure: COLONOSCOPY WITH PROPOFOL;  Surgeon: Josefine Class, MD;  Location: [facility] ENDOSCOPY;  Service: Endoscopy;  Laterality: N/A;  . COLONOSCOPY WITH PROPOFOL N/A 07/13/2017   Procedure: COLONOSCOPY WITH PROPOFOL;  Surgeon: Manya Silvas, MD;  Location: [facility] ENDOSCOPY;  Service: Endoscopy;  Laterality: N/A;  . ESOPHAGOGASTRODUODENOSCOPY (EGD) WITH PROPOFOL N/A 07/13/2017   Procedure: ESOPHAGOGASTRODUODENOSCOPY (EGD) WITH PROPOFOL;  Surgeon: Manya Silvas, MD;  Location: [facility] ENDOSCOPY;  Service: Endoscopy;  Laterality: N/A;  . EYE SURGERY      Assessment &  Plan Clinical Impression: Natalie [place]. [name] is a 65 year old female with history of COPD- oxygen dependent, Hep C, remote seizure, CKD-III (Dr.Kolluru), frequent falls, chronic pain-on methadone, major depression (recent loss of partner and pet) who was admitted to [facility] with 09/28/20 after a neighbor found her confused with difficulty talking and covered in stool.  UDS positive for methadone and benzo's. CT head negative and MRI ordered but aborted due to generalized seizure activity not responsive to ativan and started on propofol. She had tachypnea and tachycardia and was intubated emergently. She was loaded with Keppra and antibiotics due to concerns of aspiration PNA.  Dr.Kirkpatrick expressed concerns of PRES but MRI brain negative for acute changes.  LP done to rule out aseptic/viral meningitis and acycovir added. She continued to be encephalopathic and was transferred to [facility] for LT-EEG monitoring.  EEG negative for seizures and showed metabolic encephalopathy and was found to have low folic RJJO/A41 levels. HSV PCR negative and acyclovir d/c.   She tolerated extubation 03/30 and respiratory status stable. Keppra d/c per neurology input. 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  Stroke work up initiated--LE dopplers negative and 30 day event monitor recommended by Dr. Erlinda Hong to rule out source of embolic stroke. Plavix added due to ASA allergy. She has had worsening of confusion with perseveration, confabulation, poor awareness of deficits as well as balance deficits with unsteady gait.  CIR recommended due to functional decline.  .  Patient transferred to CIR on 10/11/2020 .    Patient currently requires min with basic self-care skills secondary to muscle weakness, decreased attention to right,  decreased awareness, decreased problem solving, decreased safety awareness and decreased memory and decreased standing balance, decreased postural control and decreased balance strategies.  Prior to hospitalization, patient was fully independent and lived alone.  Patient will benefit from skilled intervention to increase independence with basic self-care skills prior to discharge home with care partner.  Anticipate patient will require 24 hour supervision and follow up home health.  OT - End of Session Endurance Deficit: Yes Endurance Deficit Description: tolerated activity on room air but was tired at the end of the session. OT Assessment Rehab Potential (ACUTE ONLY): Good OT Patient demonstrates impairments in the following area(s): Balance;Cognition;Endurance;Perception;Safety;Motor OT Basic ADL's Functional Problem(s): Grooming;Bathing;Dressing;Toileting OT Transfers Functional Problem(s): Toilet;Tub/Shower OT Additional Impairment(s): None OT Plan OT Intensity: Minimum of 1-2 x/day, 45 to 90 minutes OT Frequency: 5 out of 7 days OT Duration/Estimated Length of Stay: 7-10 days OT Treatment/Interventions: Balance/vestibular training;Discharge planning;Functional mobility training;DME/adaptive equipment instruction;Cognitive remediation/compensation;Patient/family education;Psychosocial support;Therapeutic Activities;Therapeutic Exercise;Self Care/advanced ADL retraining;UE/LE Strength taining/ROM;UE/LE Coordination activities;Visual/perceptual remediation/compensation OT Self Feeding Anticipated Outcome(s): independent OT Basic Self-Care Anticipated Outcome(s): supervision OT Toileting Anticipated Outcome(s): supervision OT Bathroom Transfers Anticipated Outcome(s): supervision OT Recommendation Patient destination: Home Follow Up Recommendations: Home health OT Equipment Recommended: To be determined   OT Evaluation Precautions/Restrictions  Precautions Precautions:  Fall Precaution Comments: seizures    Vital Signs Therapy Vitals Pulse Rate: 75 Oxygen Therapy SpO2: 98 % O2 Device: Room Air Pain Pain Assessment Pain Scale: 0-10 Pain Score: 0-No pain Home Living/Prior Functioning Home Living Living Arrangements: Children (son moving in with pt) Available Help at Discharge: Family,Available 24 hours/day Type of Home: House Home Access: Stairs to enter [employer] of Steps: 5 Entrance Stairs-Rails: Left Home Layout: Two level,Bed/bath upstairs Alternate Level Stairs-Number of Steps: 14 Alternate Level Stairs-Rails: Right Bathroom Shower/Tub: Tub/shower unit,Curtain Bathroom Toilet: Standard  Lives With: Alone Prior Function Level of Independence: Independent with basic ADLs,Independent with homemaking with ambulation,Independent with gait  Able to Take Stairs?: Yes Driving: Yes Vocation: On disability Vision Baseline Vision/History: Wears glasses Wears Glasses: At all times Patient Visual Report: No change from baseline Vision Assessment?: No apparent visual deficits Additional Comments: unable to have pt follow directions for specific visual assessment Perception  Perception: Impaired Inattention/Neglect: Does not attend to right visual field;Does not attend to right side of body (mild R side attention/awareness) Praxis Praxis: Impaired Praxis-Other Comments: functional for basic familiar self care tasks; has difficulty with new motor tasks (comprehension challenges?) Cognition Overall Cognitive Status: Impaired/Different from baseline Arousal/Alertness: Awake/alert Orientation Level: Person;Place Year: Other (Comment) ("I have no idea!") Month:  ("I have no idea!") Day of Week: Incorrect ("I have no idea!") Memory: Impaired Memory Impairment: Storage deficit;Retrieval deficit;Decreased recall of new information Immediate Memory Recall: Sock;Blue;Bed Memory Recall Sock: Not able to recall Memory Recall Blue: Not  able to recall Memory Recall Bed: Not able to recall Focused Attention: Appears intact Sustained Attention: Impaired Sustained Attention Impairment: Verbal basic Awareness: Impaired Awareness Impairment: Intellectual impairment;Emergent impairment Problem Solving: Impaired Problem Solving Impairment: Verbal basic;Functional basic Executive Function: Sequencing;Organizing Sequencing: Impaired Sequencing Impairment: Manufacturing systems engineer: Impaired Organizing Impairment: Functional basic Behaviors: Perseveration (will repeat the last thing told to her, ie. kept repeating sock blue bed when asked about the year, month, etc) Safety/Judgment: Impaired Sensation Sensation Light Touch: Appears Intact  Hot/Cold: Appears Intact Proprioception:  (functional for ambulation) Stereognosis: Not tested Coordination Gross Motor Movements are Fluid and Coordinated: No Fine Motor Movements are Fluid and Coordinated: Yes Coordination and Movement Description: generalized dysmetria,decreased awareness of body position with following visual directions Finger Nose Finger Test: unable to follow directions of test Motor  Motor Motor - Skilled Clinical Observations: generalized motor weakness in core  Trunk/Postural Assessment  Thoracic Assessment Thoracic Assessment: Exceptions to [facility] (rounded shoulders) Postural Control Postural Control: Deficits on evaluation Trunk Control: decreased in standing with pt often wanting to hold onto a support  Balance Static Sitting Balance Static Sitting - Level of Assistance: 5: Stand by assistance Dynamic Sitting Balance Dynamic Sitting - Level of Assistance: 5: Stand by assistance Static Standing Balance Static Standing - Level of Assistance: 4: Min assist Dynamic Standing Balance Dynamic Standing - Level of Assistance: 3: Mod assist Extremity/Trunk Assessment RUE Assessment RUE Assessment: Within Functional Limits General Strength Comments: slight  weakness in R shoulder, but does not impede function LUE Assessment LUE Assessment: Within Functional Limits  Care Tool Care Tool Self Care Eating    set up     Oral Care    Oral Care Assist Level: Set up assist    Bathing   Body parts bathed by patient: Right arm;Left arm;Chest;Abdomen;Front perineal area;Buttocks;Right upper leg;Left upper leg;Right lower leg;Left lower leg;Face     Assist Level: Contact Guard/Touching assist    Upper Body Dressing(including orthotics)   What is the patient wearing?: Pull over shirt   Assist Level: Supervision/Verbal cueing    Lower Body Dressing (excluding footwear)   What is the patient wearing?: Underwear/pull up;Pants Assist for lower body dressing: Contact Guard/Touching assist    Putting on/Taking off footwear   What is the patient wearing?: Non-skid slipper socks Assist for footwear: Supervision/Verbal cueing       Care Tool Toileting Toileting activity   Assist for toileting: Contact Guard/Touching assist     Care Tool Bed Mobility Roll left and right activity   Roll left and right assist level: Independent    Sit to lying activity   Sit to lying assist level: Supervision/Verbal cueing    Lying to sitting edge of bed activity   Lying to sitting edge of bed assist level: Supervision/Verbal cueing     Care Tool Transfers Sit to stand transfer   Sit to stand assist level: Supervision/Verbal cueing    Chair/bed transfer   Chair/bed transfer assist level: Contact Guard/Touching assist     Toilet transfer   Assist Level: Contact Guard/Touching assist     Care Tool Cognition Expression of Ideas and Wants Expression of Ideas and Wants: Some difficulty - exhibits some difficulty with expressing needs and ideas (e.g, some words or finishing thoughts) or speech is not clear   Understanding Verbal and Non-Verbal Content Understanding Verbal and Non-Verbal Content: Sometimes understands - understands only basic conversations  or simple, direct phrases. Frequently requires cues to understand   Memory/Recall Ability *first 3 days only Memory/Recall Ability *first 3 days only: That he or she is in a hospital/hospital unit    Refer to Care Plan for [place] 1 OT Short Term Goal 1 (Week 1): STGs = LTGs  Recommendations for other services: None    Skilled Therapeutic Intervention ADL ADL Eating: Set up Grooming: Setup Upper Body Bathing: Supervision/safety Where Assessed-Upper Body Bathing: Shower Lower Body Bathing: Contact guard Where Assessed-Lower Body Bathing: Shower Upper Body Dressing: Supervision/safety Where  Assessed-Upper Body Dressing: Chair Lower Body Dressing: Contact guard Where Assessed-Lower Body Dressing: Chair Toileting: Contact guard Where Assessed-Toileting: Glass blower/designer: Therapist, music Method: Product/process development scientist Method: Heritage manager: Shower seat with back;Grab bars    Pt seen for initial evaluation and ADL training of toileting, shower, dressing with a focus on balance and endurance. Explained role of OT, discussed pt's goals. Pt feels very strongly that she can go home tomorrow. She kept saying " I am totally fine and I WILL be leaving tomorrow!'  Otherwise, pt was very cooperative with therapy and agreeable to all tasks.  She did not need much physical A, just light CGA for dynamic balance and ambulation WITHOUT AD.  Pt has difficulty following directions and expressing herself due to confusion, aphasia.   Pt resting in enclosure bed at end of session with all needs met.   Discharge Criteria: Patient will be discharged from OT if patient refuses treatment 3 consecutive times without medical reason, if treatment goals not met, if there is a change in medical status, if patient makes no progress towards goals or if patient is discharged from  hospital.  The above assessment, treatment plan, treatment alternatives and goals were discussed and mutually agreed upon: by patient  [facility] 10/12/2020, 12:40 PM

## 2020-10-12 NOTE — Progress Notes (Signed)
[facility] Individual Statement of Services  Patient Name:  Natalie [name]  Date:  10/12/2020  Welcome to the [facility].  Our goal is to provide you with an individualized program based on your diagnosis and situation, designed to meet your specific needs.  With this comprehensive rehabilitation program, you will be expected to participate in at least 3 hours of rehabilitation therapies Monday-Friday, with modified therapy programming on the weekends.  Your rehabilitation program will include the following services:  Physical Therapy (PT), Occupational Therapy (OT), Speech Therapy (ST), 24 hour per day rehabilitation nursing, Neuropsychology, Care Coordinator, Rehabilitation Medicine, Nutrition Services and Pharmacy Services  Weekly team conferences will be held on Wednesday to discuss your progress.  Your Inpatient Rehabilitation Care Coordinator will talk with you frequently to get your input and to update you on team discussions.  Team conferences with you and your family in attendance may also be held.  Expected length of stay: 7-10 days  Overall anticipated outcome: supervision cues  Depending on your progress and recovery, your program may change. Your Inpatient Rehabilitation Care Coordinator will coordinate services and will keep you informed of any changes. Your Inpatient Rehabilitation Care Coordinator's name and contact numbers are listed  below.  The following services may also be recommended but are not provided by the [facility] will be made to provide these services after discharge if needed.  Arrangements include referral to agencies that provide these services.  Your insurance has been verified to be:  Nurse, mental health and medicaid Your primary doctor is:  Event organiser  Pertinent information will be shared with your  doctor and your insurance company.  Inpatient Rehabilitation Care Coordinator:  Ovidio Kin, [place] or Emilia Beck  Information discussed with and copy given to patient by: Elease Hashimoto, 10/12/2020, 10:24 AM

## 2020-10-12 NOTE — Progress Notes (Signed)
[facility] PHYSICAL MEDICINE & REHABILITATION PROGRESS NOTE  Subjective/Complaints: Patient seen sitting up in her canopy bed this morning.  She states she slept well overnight.  No reported issues overnight.  She states she is ready begin therapies.  She states she will not be here along, because she does not need to be.  ROS: Appears to reliably denies CP, shortness of breath, nausea, vomiting, diarrhea.  Objective: Vital Signs: Blood pressure 132/64, pulse (!) 58, temperature 98 F (36.7 C), resp. rate 14, weight 48.3 kg, SpO2 96 %. MR ANGIO HEAD WO CONTRAST  Result Date: 10/10/2020 CLINICAL DATA:  Stroke follow-up. EXAM: MRA HEAD WITHOUT CONTRAST TECHNIQUE: Angiographic images of the Circle of Willis were obtained using MRA technique without intravenous contrast. COMPARISON:  None. FINDINGS: The visualized portions of the intracranial vertebral arteries are widely patent to the basilar. Patent left PICA, right AICA, and bilateral SCA origins are identified. The basilar artery is widely patent. Posterior communicating arteries are not clearly identified and may be diminutive or absent. Both PCAs are patent proximally without evidence of a significant P1 or P2 stenosis. There are missing right P3 and more distal branch vessels. The internal carotid arteries are widely patent from skull base to carotid termini. A1 and M1 segments are widely patent bilaterally. There is occlusion of the left M2 superior division 1 cm distal to the MCA bifurcation. No aneurysm is identified. IMPRESSION: 1. Left M2 superior division occlusion. 2. Multiple missing distal right PCA branch vessels. Electronically Signed   By: Logan Bores M.D.   On: 10/10/2020 17:03   MR ANGIO NECK W WO CONTRAST  Result Date: 10/10/2020 CLINICAL DATA:  Stroke follow-up. EXAM: MRA NECK WITHOUT AND WITH CONTRAST TECHNIQUE: Multiplanar and multiecho pulse sequences of the neck were obtained without and with intravenous contrast. Angiographic  images of the neck were obtained using MRA technique without and with intravenous contrast. CONTRAST:  5.8mL GADAVIST GADOBUTROL 1 MMOL/ML IV SOLN COMPARISON:  None. FINDINGS: There is a normal variant aortic arch branching pattern with common origin of the brachiocephalic and left common carotid arteries. There are mild stenoses of both subclavian arteries proximally. The common carotid and cervical internal carotid arteries are patent bilaterally without evidence of a dissection or significant stenosis. There is mild irregularity of the mid and distal left common carotid artery which may reflect atherosclerosis resulting in less than 50% narrowing. The vertebral arteries are patent and codominant with antegrade flow bilaterally. No dissection or significant vertebral artery stenosis is identified. IMPRESSION: 1. No significant cervical carotid or vertebral artery stenosis. 2. Mild bilateral subclavian artery stenoses. Electronically Signed   By: Logan Bores M.D.   On: 10/10/2020 18:06   ECHOCARDIOGRAM COMPLETE  Result Date: 10/10/2020    ECHOCARDIOGRAM REPORT   Patient Name:   Natalie [name] Date of Exam: 10/10/2020 Medical Rec #:  [id number]   Height:       63.0 in Accession #:    [id number]  Weight:       114.6 lb Date of Birth:  Apr 08, 1956   BSA:          1.526 m Patient Age:    65 years    BP:           124/61 mmHg Patient Gender: F           HR:           89 bpm. Exam Location:  Inpatient Procedure: 2D Echo, 3D Echo, Cardiac Doppler, Color Doppler and  Strain Analysis Indications:    Stroke I63.9  History:        Patient has no prior history of Echocardiogram examinations.                 COPD; Risk Factors:Current Smoker.  Sonographer:    Darlina Sicilian RDCS Referring Phys: [id number] ASHISH ARORA IMPRESSIONS  1. Left ventricular ejection fraction, by estimation, is 55 to 60%. Left ventricular ejection fraction by 3D volume is 56 %. The left ventricle has normal function. The left ventricle has no regional wall  motion abnormalities. Left ventricular diastolic  parameters are consistent with Grade I diastolic dysfunction (impaired relaxation). The average left ventricular global longitudinal strain is -23.4 %. The global longitudinal strain is normal.  2. Right ventricular systolic function is normal. The right ventricular size is normal. Tricuspid regurgitation signal is inadequate for assessing PA pressure.  3. The mitral valve is abnormal. Trivial mitral valve regurgitation.  4. The aortic valve is calcified. Aortic valve regurgitation is mild. Mild to moderate aortic valve sclerosis/calcification is present, without any evidence of aortic stenosis.  5. The inferior vena cava is normal in size with greater than 50% respiratory variability, suggesting right atrial pressure of 3 mmHg. Comparison(s): No prior Echocardiogram. Conclusion(s)/Recommendation(s): No intracardiac source of embolism detected on this transthoracic study. A transesophageal echocardiogram is recommended to exclude cardiac source of embolism if clinically indicated. FINDINGS  Left Ventricle: Left ventricular ejection fraction, by estimation, is 55 to 60%. Left ventricular ejection fraction by 3D volume is 56 %. The left ventricle has normal function. The left ventricle has no regional wall motion abnormalities. The average left ventricular global longitudinal strain is -23.4 %. The global longitudinal strain is normal. The left ventricular internal cavity size was normal in size. There is no left ventricular hypertrophy. Left ventricular diastolic parameters are consistent  with Grade I diastolic dysfunction (impaired relaxation). Indeterminate filling pressures. Right Ventricle: The right ventricular size is normal. No increase in right ventricular wall thickness. Right ventricular systolic function is normal. Tricuspid regurgitation signal is inadequate for assessing PA pressure. Left Atrium: Left atrial size was normal in size. Right Atrium: Right  atrial size was normal in size. Pericardium: There is no evidence of pericardial effusion. Mitral Valve: The mitral valve is abnormal. There is mild thickening of the mitral valve leaflet(s). Mild mitral annular calcification. Trivial mitral valve regurgitation. Tricuspid Valve: The tricuspid valve is grossly normal. Tricuspid valve regurgitation is trivial. Aortic Valve: The aortic valve is calcified. There is mild to moderate aortic valve annular calcification. Aortic valve regurgitation is mild. Aortic regurgitation PHT measures 342 msec. Mild to moderate aortic valve sclerosis/calcification is present, without any evidence of aortic stenosis. Pulmonic Valve: The pulmonic valve was grossly normal. Pulmonic valve regurgitation is trivial. Aorta: The aortic root and ascending aorta are structurally normal, with no evidence of dilitation. Venous: The inferior vena cava is normal in size with greater than 50% respiratory variability, suggesting right atrial pressure of 3 mmHg. IAS/Shunts: No atrial level shunt detected by color flow Doppler.  LEFT VENTRICLE PLAX 2D LVIDd:         4.40 cm         Diastology LVIDs:         3.00 cm         LV e' medial:    6.20 cm/s LV PW:         0.70 cm         LV E/e' medial:  8.7 LV  IVS:        0.90 cm         LV e' lateral:   7.29 cm/s LVOT diam:     2.00 cm         LV E/e' lateral: 7.4 LV SV:         58 LV SV Index:   38              2D LVOT Area:     3.14 cm        Longitudinal                                Strain                                2D Strain GLS  -21.7 %                                (A2C):                                2D Strain GLS  -21.0 %                                (A3C):                                2D Strain GLS  -27.5 %                                (A4C):                                2D Strain GLS  -23.4 %                                Avg:                                 3D Volume EF                                LV 3D EF:    Left                                              ventricular                                             ejection  fraction by                                             3D volume                                             is 56 %.                                 3D Volume EF:                                3D EF:        56 % RIGHT VENTRICLE RV S prime:     16.20 cm/s TAPSE (M-mode): 1.6 cm LEFT ATRIUM             Index       RIGHT ATRIUM          Index LA diam:        3.20 cm 2.10 cm/m  [name] Area:     9.62 cm LA Vol (A2C):   21.4 ml 14.02 ml/m [name] Volume:   18.50 ml 12.12 ml/m LA Vol (A4C):   17.8 ml 11.66 ml/m LA Biplane Vol: 20.5 ml 13.43 ml/m  AORTIC VALVE LVOT Vmax:   116.00 cm/s LVOT Vmean:  68.100 cm/s LVOT VTI:    0.186 m AI PHT:      342 msec  AORTA Ao Root diam: 2.70 cm MITRAL VALVE MV Area (PHT): 3.08 cm    SHUNTS MV Decel Time: 246 msec    Systemic VTI:  0.19 m MV E velocity: 54.00 cm/s  Systemic Diam: 2.00 cm MV A velocity: 58.30 cm/s MV E/A ratio:  0.93 Lyman Bishop MD Electronically signed by Lyman Bishop MD Signature Date/Time: 10/10/2020/10:50:09 AM    Final    VAS [place] LOWER EXTREMITY VENOUS (DVT)  Result Date: 10/10/2020  Lower Venous DVT Study Indications: Stroke.  Comparison Study: No prior study Performing Technologist: Maudry Mayhew MHA, RDMS, RVT, RDCS  Examination Guidelines: A complete evaluation includes B-mode imaging, spectral Doppler, color Doppler, and power Doppler as needed of all accessible portions of each vessel. Bilateral testing is considered an integral part of a complete examination. Limited examinations for reoccurring indications may be performed as noted. The reflux portion of the exam is performed with the patient in reverse Trendelenburg.  +---------+---------------+---------+-----------+----------+--------------+ RIGHT    CompressibilityPhasicitySpontaneityPropertiesThrombus Aging  +---------+---------------+---------+-----------+----------+--------------+ CFV      Full           Yes      Yes                                 +---------+---------------+---------+-----------+----------+--------------+ SFJ      Full                                                        +---------+---------------+---------+-----------+----------+--------------+ FV Prox  Full                                                        +---------+---------------+---------+-----------+----------+--------------+ FV Mid   Full                                                        +---------+---------------+---------+-----------+----------+--------------+ FV DistalFull                                                        +---------+---------------+---------+-----------+----------+--------------+ PFV      Full                                                        +---------+---------------+---------+-----------+----------+--------------+ POP      Full           Yes      Yes                                 +---------+---------------+---------+-----------+----------+--------------+ PTV      Full                                                        +---------+---------------+---------+-----------+----------+--------------+ PERO     Full                                                        +---------+---------------+---------+-----------+----------+--------------+   +---------+---------------+---------+-----------+----------+--------------+ LEFT     CompressibilityPhasicitySpontaneityPropertiesThrombus Aging +---------+---------------+---------+-----------+----------+--------------+ CFV      Full           Yes      Yes                                 +---------+---------------+---------+-----------+----------+--------------+ SFJ      Full                                                         +---------+---------------+---------+-----------+----------+--------------+ FV Prox  Full                                                        +---------+---------------+---------+-----------+----------+--------------+  FV Mid   Full                                                        +---------+---------------+---------+-----------+----------+--------------+ FV DistalFull                                                        +---------+---------------+---------+-----------+----------+--------------+ PFV      Full                                                        +---------+---------------+---------+-----------+----------+--------------+ POP      Full           Yes      Yes                                 +---------+---------------+---------+-----------+----------+--------------+ PTV      Full                                                        +---------+---------------+---------+-----------+----------+--------------+ PERO     Full                                                        +---------+---------------+---------+-----------+----------+--------------+     Summary: RIGHT: - There is no evidence of deep vein thrombosis in the lower extremity.  - No cystic structure found in the popliteal fossa.  LEFT: - There is no evidence of deep vein thrombosis in the lower extremity.  - No cystic structure found in the popliteal fossa.  *See table(s) above for measurements and observations. Electronically signed by Harold Barban MD on 10/10/2020 at 9:57:39 PM.    Final    Recent Labs    10/12/20 0524  WBC 6.8  HGB 12.2  HCT 36.9  PLT 295   Recent Labs    10/10/20 0406 10/12/20 0524  NA 141 139  K 4.0 3.7  CL 100 101  CO2 32 32  GLUCOSE 102* 94  BUN 16 17  CREATININE 1.19* 1.11*  CALCIUM 10.1 9.6    Intake/Output Summary (Last 24 hours) at 10/12/2020 1020 Last data filed at 10/12/2020 5573 Gross per 24 hour  Intake 240 ml  Output --  Net  240 ml        Physical Exam: BP 132/64 (BP Location: Right Arm)   Pulse (!) 58   Temp 98 F (36.7 C)   Resp 14   Wt 48.3 kg   SpO2 96%   BMI 18.86 kg/m  Constitutional: No distress . Vital signs reviewed.  Thin. HENT: Normocephalic.  Atraumatic. Eyes: EOMI.  No discharge. Cardiovascular: No JVD.  RRR. Respiratory: Normal effort.  No stridor.  Bilateral clear to auscultation. GI: Non-distended.  BS +. Skin: Warm and dry.  Intact. Psych: Normal mood.  Slightly impulsive. Musc: No edema in extremities.  No tenderness in extremities. Neuro: Alert and oriented x2 Motor: Grossly 5/5 throughout   Assessment/Plan: 1. Functional deficits which require 3+ hours per day of interdisciplinary therapy in a comprehensive inpatient rehab setting.  Physiatrist is providing close team supervision and 24 hour management of active medical problems listed below.  Physiatrist and rehab team continue to assess barriers to discharge/monitor patient progress toward functional and medical goals   Care Tool:  Bathing              Bathing assist       Upper Body Dressing/Undressing Upper body dressing        Upper body assist      Lower Body Dressing/Undressing Lower body dressing            Lower body assist       Toileting Toileting    Toileting assist Assist for toileting: Supervision/Verbal cueing     Transfers Chair/bed transfer  Transfers assist     Chair/bed transfer assist level: Supervision/Verbal cueing     Locomotion Ambulation   Ambulation assist              Walk 10 feet activity   Assist           Walk 50 feet activity   Assist           Walk 150 feet activity   Assist           Walk 10 feet on uneven surface  activity   Assist           Wheelchair     Assist               Wheelchair 50 feet with 2 turns activity    Assist            Wheelchair 150 feet activity     Assist            Medical Problem List and Plan: 1.  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  Antithrombotics: -DVT/anticoagulation:  Pharmaceutical: Lovenox             -antiplatelet therapy: Plavix 3. Chronic pain/Pain Management: Methadone 25 mg tid with gabapentin at bedtime.   Son would like to wean patient's methadone, He is unclear after going through his notes are much better on patient was taking prior to admission,?  140 mg.  He states he will look at his notes again and call back with more information. 4. Mood: LCSW to follow for evaluation and support.              -antipsychotic agents: N/A 5. Neuropsych: This patient is not capable of making decisions on her own behalf. 6. Skin/Wound Care: Routine pressure relief measures.  7. Fluids/Electrolytes/Nutrition: Monitor I/Os. 8. Vitamin B12 deficiency: Continue po supplement.  9. Thiamine deficiency:  Continue po thiamine daily.  10. CKD stage III: SCr 1.2 at baseline.   Creatinine 1.11 on 4/8 11. COPD: Was using oxygen 3L per [place] in addition to nebs (Per family/record review)  Will resume at  1 liter and titrate as needed.  Nebs prn.   Wean as tolerated 12. Encephalopathy: Will order enclosure bed due to ongoing confusion and fall risk.  Continue telesitter for safety 13. Aphasia/confusion- as per #12.  14.  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

## 2020-10-13 NOTE — Progress Notes (Signed)
[facility] PHYSICAL MEDICINE & REHABILITATION PROGRESS NOTE  Subjective/Complaints:  No issues overnight , working with SLP, +work finding deficits but no other signs of aphasia Looks comfortable stitting cross legged in bed   ROS: Appears to reliable denies CP, shortness of breath, nausea, vomiting, diarrhea.  Objective: Vital Signs: Blood pressure 125/77, pulse 75, temperature (!) 97.4 F (36.3 C), temperature source Oral, resp. rate 16, weight 48.3 kg, SpO2 92 %. No results found. Recent Labs    10/12/20 0524  WBC 6.8  HGB 12.2  HCT 36.9  PLT 295   Recent Labs    10/12/20 0524  NA 139  K 3.7  CL 101  CO2 32  GLUCOSE 94  BUN 17  CREATININE 1.11*  CALCIUM 9.6    Intake/Output Summary (Last 24 hours) at 10/13/2020 1021 Last data filed at 10/13/2020 0740 Gross per 24 hour  Intake 480 ml  Output --  Net 480 ml        Physical Exam: BP 125/77 (BP Location: Left Arm)   Pulse 75   Temp (!) 97.4 F (36.3 C) (Oral)   Resp 16   Wt 48.3 kg   SpO2 92%   BMI 18.86 kg/m   General: No acute distress Mood and affect are appropriate Heart: Regular rate and rhythm no rubs murmurs or extra sounds Lungs: Clear to auscultation, breathing unlabored, no rales or wheezes Abdomen: Positive bowel sounds, soft nontender to palpation, nondistended Extremities: No clubbing, cyanosis, or edema Skin: No evidence of breakdown, no evidence of rash  Psych: Normal mood.  Slightly impulsive. Musc: No edema in extremities.  No tenderness in extremities. Neuro: Alert and oriented x2, not oriented to time , full ROM without pain  Motor: Grossly 5/5 throughout   Assessment/Plan: 1. Functional deficits which require 3+ hours per day of interdisciplinary therapy in a comprehensive inpatient rehab setting.  Physiatrist is providing close team supervision and 24 hour management of active medical problems listed below.  Physiatrist and rehab team continue to assess barriers to  discharge/monitor patient progress toward functional and medical goals   Care Tool:  Bathing    Body parts bathed by patient: Right arm,Left arm,Chest,Abdomen,Front perineal area,Buttocks,Right upper leg,Left upper leg,Right lower leg,Left lower leg,Face         Bathing assist Assist Level: Contact Guard/Touching assist     Upper Body Dressing/Undressing Upper body dressing   What is the patient wearing?: Pull over shirt    Upper body assist Assist Level: Supervision/Verbal cueing    Lower Body Dressing/Undressing Lower body dressing      What is the patient wearing?: Underwear/pull up,Pants     Lower body assist Assist for lower body dressing: Contact Guard/Touching assist     Toileting Toileting    Toileting assist Assist for toileting: Contact Guard/Touching assist     Transfers Chair/bed transfer  Transfers assist     Chair/bed transfer assist level: Contact Guard/Touching assist     Locomotion Ambulation   Ambulation assist      Assist level: Contact Guard/Touching assist Assistive device: Walker-rolling Max distance: 170ft   Walk 10 feet activity   Assist     Assist level: Contact Guard/Touching assist Assistive device: Walker-rolling   Walk 50 feet activity   Assist    Assist level: Contact Guard/Touching assist Assistive device: Walker-rolling    Walk 150 feet activity   Assist    Assist level: Contact Guard/Touching assist Assistive device: Walker-rolling    Walk 10 feet on uneven surface  activity   Assist     Assist level: Contact Guard/Touching assist Assistive device: Aeronautical engineer Will patient use wheelchair at discharge?: No   Wheelchair activity did not occur: N/A         Wheelchair 50 feet with 2 turns activity    Assist    Wheelchair 50 feet with 2 turns activity did not occur: N/A       Wheelchair 150 feet activity     Assist  Wheelchair 150 feet  activity did not occur: N/A        Medical Problem List and Plan: 1.  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  Antithrombotics: -DVT/anticoagulation:  Pharmaceutical: Lovenox             -antiplatelet therapy: Plavix 3. Chronic pain/Pain Management: Methadone 25 mg tid with gabapentin at bedtime.   Son would like to wean patient's methadone, He is unclear after going through his notes are much better on patient was taking prior to admission,?  140 mg.  He states he will look at his notes again and call back with more information.  Pt not in pain , will see how she does in therapy but may start to wean Monday  4. Mood: LCSW to follow for evaluation and support.              -antipsychotic agents: N/A 5. Neuropsych: This patient is not capable of making decisions on her own behalf. 6. Skin/Wound Care: Routine pressure relief measures.  7. Fluids/Electrolytes/Nutrition: Monitor I/Os. 8. Vitamin B12 deficiency: Continue po supplement.  9. Thiamine deficiency:  Continue po thiamine daily.  10. CKD stage III: SCr 1.2 at baseline.   Creatinine 1.11 on 4/8 11. COPD: Was using oxygen 3L per [place] in addition to nebs (Per family/record review)  Will resume at 1 liter and titrate as needed.  Nebs prn.   Wean as tolerated 12. Encephalopathy: Will order enclosure bed due to ongoing confusion and fall risk.  Continue telesitter for safety 13. Aphasia/confusion- as per #12.  14.  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

## 2020-10-13 NOTE — Progress Notes (Signed)
Occupational Therapy Session Note  Patient Details  Name: Natalie [name] MRN: [id number] Date of Birth: 09-14-1955  Today's Date: 10/13/2020 OT Individual Time: 6269-4854 OT Individual Time Calculation (min): 71 min   Short Term Goals: Week 1:  OT Short Term Goal 1 (Week 1): STGs = LTGs   Skilled Therapeutic Interventions/Progress Updates:    Pt greeted in the enclosure bed, asleep, easily wakened. She did not want to shower, though was encouraged to do so. Pt agreeable to spend time with therapist until her son came to take her home. Toileting completed twice during session, pt ambulating with close supervision-CGA without device. Brief changed with pt standing. Handwashing completed while standing as well. She ambulated around the room to pack up her things, unpacking drawers. Able to redirect her to out of room therapy. Pt ambulated to the dayroom and worked on UB/LB coordination as well as dynamic balance while engaging in [employer]. Pt laughing during participation, stating "what are they doing?" Transitioned to cleaning up the gym equipment to work on dynamic standing balance during functional activity as well as therapeutic means of distraction due to wanting to leave unit, pt ambulating around table with close supervision, bending outside of base of support to clean table. She returned to the room and continued to pack up things and clean with supervision assistance for balance. Unable to redirect pt to another task at this time. Pt adamant that she was going to just leave without family assist. Pt left in care of RN for de-escalation and to take her medicine.   Therapy Documentation Precautions:  Precautions Precautions: Fall Precaution Comments: seizures Restrictions Weight Bearing Restrictions: No Vital Signs: Therapy Vitals Temp: 98.2 F (36.8 C) Pulse Rate: 67 Resp: 14 BP: (!) 118/57 Patient Position (if appropriate): Sitting Oxygen Therapy SpO2: 94 % O2 Device: Room  Air Pain: no s/s pain during tx   ADL: ADL Eating: Set up Grooming: Setup Upper Body Bathing: Supervision/safety Where Assessed-Upper Body Bathing: Shower Lower Body Bathing: Contact guard Where Assessed-Lower Body Bathing: Shower Upper Body Dressing: Supervision/safety Where Assessed-Upper Body Dressing: Chair Lower Body Dressing: Contact guard Where Assessed-Lower Body Dressing: Chair Toileting: Contact guard Where Assessed-Toileting: Glass blower/designer: Therapist, music Method: Magazine features editor: Curator Method: Heritage manager: Civil engineer, contracting with back,Grab bars     Therapy/Group: Individual Therapy  Cassidie Veiga A Geral Coker 10/13/2020, 3:52 PM

## 2020-10-13 NOTE — Progress Notes (Signed)
Speech Language Pathology Daily Session Note  Patient Details  Name: Natalie [name] MRN: [id number] Date of Birth: December 14, 1955  Today's Date: 10/13/2020 SLP Individual Time: 1003-1045 SLP Individual Time Calculation (min): 42 min  Short Term Goals: Week 1: SLP Short Term Goal 1 (Week 1): Patient will demonstrate intellectual awareness to her deficits in cognition and language with modA. SLP Short Term Goal 2 (Week 1): Patient will repair errors in naming/describing and semantic errors during structured language tasks with min-modA cues. SLP Short Term Goal 3 (Week 1): Patient will tolerate current diet (Dys 2 solids) and thin liquids as well as trials of Dys 3 solids without overt s/s aspiration or penetration. SLP Short Term Goal 4 (Week 1): Patient will perform divergent and convergent naming with modA cues. SLP Short Term Goal 5 (Week 1): Patient will identify absurdities and problems in pictures/photos and describe at least one solution, with min-modA  Skilled Therapeutic Interventions: Pt seen for skilled ST with focus on dysphagia and cognitive communication goals. Pt in enclosure bed, SLP unzipping one side for therapeutic tasks, no attempt to wander. Pt states she is discharging today, she is here due to a fall and that she "just worked with Speech Therapy. We went for a long walk in the street". Pt gently re-educated on rationale behind hospital/rehab stay, recent stroke, and goals of Speech Therapy. Pt able to name common objects 80% accuracy, min cues to repair errors increased to 100% accuracy. Attempting divergent naming task as patient unable during evaluation, pt with frequent perseveration on incorrect category naming, does benefit from mod semantic and phonemic cues.  Pt continues to be impulsive during language tasks impacting accuracy. Pt agreeable to Dys 3 trials, dentures in however pt states they are too big. Pt with mildly prolonged mastication of Dys 3 solids, functional bolus  cohesion and transit, no s/s aspiration or significant residue after swallow. Pt only agreeable to 2 bites at this time, will benefit from further trials/whole meal trial. Pt will benefit from ongoing ST for expressive language, cognition and swallow. Pt secured in enclosure bed with telesitter on, all needs met at this time. Cont ST POC.   Pain Pain Assessment Pain Scale: 0-10 Pain Score: 0-No pain  Therapy/Group: Individual Therapy  Dewaine Conger 10/13/2020, 10:55 AM

## 2020-10-13 NOTE — Progress Notes (Signed)
Physical Therapy Session Note  Patient Details  Name: Natalie [name] MRN: [id number] Date of Birth: 1956-06-10  Today's Date: 10/13/2020 PT Individual Time: 0801-0855 PT Individual Time Calculation (min): 54 min   Short Term Goals: Week 1:  PT Short Term Goal 1 (Week 1): STG = LTG due to ELOS  Skilled Therapeutic Interventions/Progress Updates:    Pt greeted seated EOB with RN present, supervising breakfast. Handoff of care to PT as pt agreeable to therapy session. She reports no pain and doesn't recognize therapist from yesterday's session. Pt expressing desire to return home, reports she's ready to go home today and is waiting on her sisters to come pick her up. Pt unable to recall where her sisters live and produces somewhat nonsensical stories regarding her family and friends. Sit<>stand with supervision to RW and ambulated from her room to main rehab gym with supervision and RW, ~1102ft. Pt completed the BERG balance test with results and details below. Pt scored 44/56, indicative of increased falls risk and score/significance related to pt.  Patient demonstrates increased fall risk as noted by score of  44/56 on Berg Balance Scale.  (<36= high risk for falls, close to 100%; 37-45 significant >80%; 46-51 moderate >50%; 52-55 lower >25%)  Pt then completed 6MWT with RW and supervision - covering 625ft. Age norm 1726ft.  Pt ambulated back to her room, 165ft, with supervision and RW - informed pt of her room # and instructed her to attempt to locate. Pt needing max cues to locate accurate room # due to both short term memory deficits and distractibility.   Sit>supine mod I in Canopy bed. Canopy bed secured and pt ended session supine with needs in reach. Telesitter on.   Therapy Documentation Precautions:  Precautions Precautions: Fall Precaution Comments: seizures Restrictions Weight Bearing Restrictions: No General:     Balance: Balance Balance Assessed: Yes Standardized Balance  Assessment Standardized Balance Assessment: Berg Balance Test  Berg Balance Test Sit to Stand: Able to stand without using hands and stabilize independently Standing Unsupported: Able to stand safely 2 minutes Sitting with Back Unsupported but Feet Supported on Floor or Stool: Able to sit safely and securely 2 minutes Stand to Sit: Sits safely with minimal use of hands Transfers: Able to transfer safely, minor use of hands Standing Unsupported with Eyes Closed: Able to stand 10 seconds safely Standing Ubsupported with Feet Together: Able to place feet together independently and stand 1 minute safely From Standing, Reach Forward with Outstretched Arm: Can reach forward >12 cm safely (5") From Standing Position, Pick up Object from Floor: Able to pick up shoe safely and easily From Standing Position, Turn to Look Behind Over each Shoulder: Looks behind from both sides and weight shifts well Turn 360 Degrees: Able to turn 360 degrees safely but slowly Standing Unsupported, Alternately Place Feet on Step/Stool: Able to complete >2 steps/needs minimal assist Standing Unsupported, One Foot in Front: Able to take small step independently and hold 30 seconds Standing on One Leg: Unable to try or needs assist to prevent fall Total Score: 44  Therapy/Group: Individual Therapy  Lemar Bakos P Kalib Bhagat PT 10/13/2020, 7:29 AM

## 2020-10-14 MED ORDER — METHADONE HCL 10 MG PO TABS
20.0000 mg | ORAL_TABLET | Freq: Three times a day (TID) | ORAL | Status: DC
  Administered 2020-10-14 – 2020-10-19 (×16): 20 mg via ORAL
  Filled 2020-10-14 (×16): qty 2

## 2020-10-14 NOTE — Progress Notes (Signed)
[place] PHYSICAL MEDICINE & REHABILITATION PROGRESS NOTE  Subjective/Complaints:  "pt insists I told her my birthdate yesterday , pt does not recall my profession, but is oriented to time this am  Sitting up comfortably in net bed "cross legged"  ROS: denies CP, shortness of breath, nausea, vomiting, diarrhea.  Objective: Vital Signs: Blood pressure (!) 116/56, pulse 71, temperature 98.5 F (36.9 C), temperature source Oral, resp. rate 18, weight 48.3 kg, SpO2 94 %. No results found. Recent Labs    10/12/20 0524  WBC 6.8  HGB 12.2  HCT 36.9  PLT 295   Recent Labs    10/12/20 0524  NA 139  K 3.7  CL 101  CO2 32  GLUCOSE 94  BUN 17  CREATININE 1.11*  CALCIUM 9.6    Intake/Output Summary (Last 24 hours) at 10/14/2020 0751 Last data filed at 10/13/2020 1700 Gross per 24 hour  Intake 240 ml  Output --  Net 240 ml        Physical Exam: BP (!) 116/56 (BP Location: Left Arm)   Pulse 71   Temp 98.5 F (36.9 C) (Oral)   Resp 18   Wt 48.3 kg   SpO2 94%   BMI 18.86 kg/m   General: No acute distress Mood and affect are appropriate Heart: Regular rate and rhythm no rubs murmurs or extra sounds Lungs: Clear to auscultation, breathing unlabored, no rales or wheezes Abdomen: Positive bowel sounds, soft nontender to palpation, nondistended Extremities: No clubbing, cyanosis, or edema Skin: No evidence of breakdown, no evidence of rash  Psych: Normal mood.  Slightly impulsive. Musc: No edema in extremities.  No tenderness in extremities. Neuro: Alert and oriented x2, not oriented to time , full ROM without pain  Motor: Grossly 5/5 throughout   Assessment/Plan: 1. Functional deficits which require 3+ hours per day of interdisciplinary therapy in a comprehensive inpatient rehab setting.  Physiatrist is providing close team supervision and 24 hour management of active medical problems listed below.  Physiatrist and rehab team continue to assess barriers to  discharge/monitor patient progress toward functional and medical goals   Care Tool:  Bathing    Body parts bathed by patient: Right arm,Left arm,Chest,Abdomen,Front perineal area,Buttocks,Right upper leg,Left upper leg,Right lower leg,Left lower leg,Face         Bathing assist Assist Level: Contact Guard/Touching assist     Upper Body Dressing/Undressing Upper body dressing   What is the patient wearing?: Pull over shirt    Upper body assist Assist Level: Supervision/Verbal cueing    Lower Body Dressing/Undressing Lower body dressing      What is the patient wearing?: Underwear/pull up,Pants     Lower body assist Assist for lower body dressing: Contact Guard/Touching assist     Toileting Toileting    Toileting assist Assist for toileting: Contact Guard/Touching assist     Transfers Chair/bed transfer  Transfers assist     Chair/bed transfer assist level: Contact Guard/Touching assist     Locomotion Ambulation   Ambulation assist      Assist level: Contact Guard/Touching assist Assistive device: Walker-rolling Max distance: 110ft   Walk 10 feet activity   Assist     Assist level: Contact Guard/Touching assist Assistive device: Walker-rolling   Walk 50 feet activity   Assist    Assist level: Contact Guard/Touching assist Assistive device: Walker-rolling    Walk 150 feet activity   Assist    Assist level: Contact Guard/Touching assist Assistive device: Walker-rolling    Walk 10  feet on uneven surface  activity   Assist     Assist level: Contact Guard/Touching assist Assistive device: Aeronautical engineer Will patient use wheelchair at discharge?: No   Wheelchair activity did not occur: N/A         Wheelchair 50 feet with 2 turns activity    Assist    Wheelchair 50 feet with 2 turns activity did not occur: N/A       Wheelchair 150 feet activity     Assist  Wheelchair 150 feet  activity did not occur: N/A        Medical Problem List and Plan: 1.  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  Antithrombotics: -DVT/anticoagulation:  Pharmaceutical: Lovenox             -antiplatelet therapy: Plavix 3. Chronic pain/Pain Management: Methadone 25 mg tid with gabapentin at bedtime.   Son would like to wean patient's methadone, He is unclear after going through his notes are much better on patient was taking prior to admission,?  140 mg.  He states he will look at his notes again and call back with more information. Current dose 25mg  TID  Pt not in pain , will reduce methadone to 20mg  TID 4. Mood: LCSW to follow for evaluation and support.              -antipsychotic agents: N/A 5. Neuropsych: This patient is not capable of making decisions on her own behalf. 6. Skin/Wound Care: Routine pressure relief measures.  7. Fluids/Electrolytes/Nutrition: Monitor I/Os. 8. Vitamin B12 deficiency: Continue po supplement.  9. Thiamine deficiency:  Continue po thiamine daily.  10. CKD stage III: SCr 1.2 at baseline.   Creatinine 1.11 on 4/8 11. COPD: Was using oxygen 3L per [place] in addition to nebs (Per family/record review)  Will resume at 1 liter and titrate as needed.  Nebs prn.   Wean as tolerated 12. Encephalopathy: per RN safety awareness improving will unzip enclosure bed today and if ok d/c enclosure bed in am  13. Aphasia/confusion- as per #12.  14.  Hypoalbuminemia  Supplement initiated on 4/8  15.  Post stroke dysphagia  D1 thins, advance diet as tolerated   LOS: 3 days A FACE TO FACE EVALUATION WAS PERFORMED  Charlett Blake 10/14/2020, 7:51 AM

## 2020-10-14 NOTE — Progress Notes (Signed)
Patient in bed sitting cross legged with net of enclosure bed open no behaviors or agitation noted this shift and patient compliant with safety policies of unit

## 2020-10-15 LAB — BASIC METABOLIC PANEL
Anion gap: 8 (ref 5–15)
BUN: 21 mg/dL (ref 8–23)
CO2: 30 mmol/L (ref 22–32)
Calcium: 9.6 mg/dL (ref 8.9–10.3)
Chloride: 101 mmol/L (ref 98–111)
Creatinine, Ser: 1.04 mg/dL — ABNORMAL HIGH (ref 0.44–1.00)
GFR, Estimated: >60 mL/min (ref >60)
Glucose, Bld: 93 mg/dL (ref 70–99)
Potassium: 3.8 mmol/L (ref 3.5–5.1)
Sodium: 139 mmol/L (ref 135–145)

## 2020-10-15 LAB — CBC
HCT: 40.4 % (ref 36.0–46.0)
Hemoglobin: 13.2 g/dL (ref 12.0–15.0)
MCH: 31.4 pg (ref 26.0–34.0)
MCHC: 32.7 g/dL (ref 30.0–36.0)
MCV: 96.2 fL (ref 80.0–100.0)
Platelets: 288 10*3/uL (ref 150–400)
RBC: 4.2 MIL/uL (ref 3.87–5.11)
RDW: 15.4 % (ref 11.5–15.5)
WBC: 5 10*3/uL (ref 4.0–10.5)
nRBC: 0 % (ref 0.0–0.2)

## 2020-10-15 NOTE — Progress Notes (Signed)
Patient ID: Natalie [name], female   DOB: 03/05/1956, 65 y.o.   MRN: [id number] Spoke with matthew-son who reports the family is unsure if can provide the care pt will need at discharge. Discussed she will not have SNF coverage form [employer] and physically too high level that is SNF level. Discussed ALF and memory care which are difficult to find with Medicaid and son not sure if has full medicaid or supplemental medicaid. Discussed private pay and pt will definitely need 24/7 supervision due to cognitive issues and for safety. He wants therapy to work on stairs and will need to DC enclosure bed prior to discharge. Son to get back with family and call worker back. Encouraged him to come in for therapies.

## 2020-10-15 NOTE — Progress Notes (Signed)
[place] PHYSICAL MEDICINE & REHABILITATION PROGRESS NOTE  Subjective/Complaints: He has no complaints this morning.  Bradycardic.  Cr 1.04 Placed pharmacy consult regarding weaning of methadone.   ROS: denies CP, shortness of breath, nausea, vomiting, diarrhea, pain  Objective: Vital Signs: Blood pressure 134/71, pulse (!) 53, temperature 99.6 F (37.6 C), temperature source Oral, resp. rate 18, weight 48.3 kg, SpO2 95 %. No results found. Recent Labs    10/15/20 0700  WBC 5.0  HGB 13.2  HCT 40.4  PLT 288   Recent Labs    10/15/20 0700  NA 139  K 3.8  CL 101  CO2 30  GLUCOSE 93  BUN 21  CREATININE 1.04*  CALCIUM 9.6    Intake/Output Summary (Last 24 hours) at 10/15/2020 1334 Last data filed at 10/15/2020 0900 Gross per 24 hour  Intake 240 ml  Output --  Net 240 ml        Physical Exam: BP 134/71 (BP Location: Left Arm)   Pulse (!) 53   Temp 99.6 F (37.6 C) (Oral)   Resp 18   Wt 48.3 kg   SpO2 95%   BMI 18.86 kg/m  Gen: no distress, normal appearing HEENT: oral mucosa pink and moist, NCAT Cardio: Reg rate Chest: normal effort, normal rate of breathing Abd: soft, non-distended Ext: no edema Psych: Normal mood.  Slightly impulsive. Musc: No edema in extremities.  No tenderness in extremities. Neuro: Alert and oriented x2, not oriented to time , full ROM without pain  Motor: Grossly 5/5 throughout   Assessment/Plan: 1. Functional deficits which require 3+ hours per day of interdisciplinary therapy in a comprehensive inpatient rehab setting.  Physiatrist is providing close team supervision and 24 hour management of active medical problems listed below.  Physiatrist and rehab team continue to assess barriers to discharge/monitor patient progress toward functional and medical goals   Care Tool:  Bathing    Body parts bathed by patient: Right arm,Left arm,Chest,Abdomen,Front perineal area,Buttocks,Right upper leg,Left upper leg,Right lower  leg,Left lower leg,Face         Bathing assist Assist Level: Contact Guard/Touching assist     Upper Body Dressing/Undressing Upper body dressing   What is the patient wearing?: Pull over shirt    Upper body assist Assist Level: Supervision/Verbal cueing    Lower Body Dressing/Undressing Lower body dressing      What is the patient wearing?: Underwear/pull up,Pants     Lower body assist Assist for lower body dressing: Contact Guard/Touching assist     Toileting Toileting    Toileting assist Assist for toileting: Set up assist     Transfers Chair/bed transfer  Transfers assist     Chair/bed transfer assist level: Supervision/Verbal cueing     Locomotion Ambulation   Ambulation assist      Assist level: Contact Guard/Touching assist Assistive device: Walker-rolling Max distance: 146ft   Walk 10 feet activity   Assist     Assist level: Contact Guard/Touching assist Assistive device: Walker-rolling   Walk 50 feet activity   Assist    Assist level: Contact Guard/Touching assist Assistive device: Walker-rolling    Walk 150 feet activity   Assist    Assist level: Contact Guard/Touching assist Assistive device: Walker-rolling    Walk 10 feet on uneven surface  activity   Assist     Assist level: Contact Guard/Touching assist Assistive device: Walker-rolling   Wheelchair     Assist Will patient use wheelchair at discharge?: No   Wheelchair activity did  not occur: N/A         Wheelchair 50 feet with 2 turns activity    Assist    Wheelchair 50 feet with 2 turns activity did not occur: N/A       Wheelchair 150 feet activity     Assist  Wheelchair 150 feet activity did not occur: N/A        Medical Problem List and Plan: 1.  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  Impaired mobility: -DVT/anticoagulation:   Pharmaceutical: d/c Lovenox since ambulating >300 feet             -antiplatelet therapy: Plavix 3. Chronic pain/Pain Management:   4/11: pharmacy consult placed for assistance with methadone wean.   4. Mood: LCSW to follow for evaluation and support.              -antipsychotic agents: N/A 5. Neuropsych: This patient is not capable of making decisions on her own behalf. 6. Skin/Wound Care: Routine pressure relief measures.  7. Fluids/Electrolytes/Nutrition: Monitor I/Os. 8. Vitamin B12 deficiency: Continue po supplement.  9. Thiamine deficiency:  Continue po thiamine daily.  10. CKD stage III: SCr 1.2 at baseline.   Creatinine 1.11 on 4/8, down to 1.04 on 4/11 11. COPD: Was using oxygen 3L per [place] in addition to nebs (Per family/record review)  Will resume at 1 liter and titrate as needed.  Nebs prn.   Wean as tolerated 12. Encephalopathy: per RN safety awareness improving will unzip enclosure bed today and if ok d/c enclosure bed in am  13. Aphasia/confusion- as per #12.  14.  Hypoalbuminemia  Supplement initiated on 4/8  15.  Post stroke dysphagia  D1 thins, advance diet as tolerated   LOS: 4 days A FACE TO FACE EVALUATION WAS PERFORMED  Martha Clan P Jatziri Goffredo 10/15/2020, 1:34 PM

## 2020-10-15 NOTE — Progress Notes (Signed)
Patient ID: Natalie [name], female   DOB: 01-May-1956, 65 y.o.   MRN: [id number]  Met with pt who is sitting in the open vail bed and doing quite well. She seems to be clearing and taking about her progress and going home. Discussed son and family's concern about her care, she feels she is doing well and at times acknowledges she struggles but feels better today than last Friday. She realizes the concerns son has and discussed the steps at home and her need to be able to go up and down them. Pt wants to go home and not to another facility, she will talk with family. She feels once home she will be less restless and can do what she did before she knows she can not drive at this time. Continue to work on discharge plan. Son does not drive so unsure if will come for education. Discussed this with pt and the need for him to see her in therapies so can calm his nerves about discharge.

## 2020-10-15 NOTE — Progress Notes (Signed)
Occupational Therapy Session Note  Patient Details  Name: Natalie [name] MRN: [id number] Date of Birth: [birth year]  Today's Date: 10/15/2020 OT Individual Time: 1100-1200 OT Individual Time Calculation (min): 60 min    Short Term Goals: Week 1:  OT Short Term Goal 1 (Week 1): STGs = LTGs  Skilled Therapeutic Interventions/Progress Updates:    Pt received sitting in open enclosure bed dressed for the day. She declined a shower but agreeable to therapy.  She used the toilet initially not needing AD to ambulate in and out of bathroom with S only and toileted with distant S.  Pt stated she would prefer to use RW to walk longer distance. With RW ambulated to day room and she requested to sit on a mat near a window.  Pt engaged in large GM activities for balance, overall strength and following directions. She worked on sit to stands holding a ball and then reaching the ball over head 12x, torso twists and then reaching ball towards alternating sides of shins for dynamic reach. From a balance perspective, she only needed S but did need max cues at times as she had a great deal of difficulty following directions.    Because the initial evaluation was challenging to test hand strength and FMC, used the dynamometer (40 lbs in each hand) and box and block test.  Pt had a great deal of difficulty with this simple task even with breaking it down as simply as possible. She demonstrated severe apraxia initially, trying to lean forward and pick up a block with her mouth and then for the first 10 blocks tried to put them in her mouth.  (pt had removed her mask as no one was in the dayroom, so had her redon her mask) Pt then completed the test at 25 blocks on both sides which is a slow rate.   Had her practice counting money and needed cues to break it down as she was counting up to $100 well but could not keep adding 210, 220, 230 - she kept repeating 10, 20, 30. Had her look at safety cards and read out loud  questions, such as "should you take an elevator in case of a fire?"  She would often read the wrong word and had difficulty answering the questions.  Provided her with a calendar as she is still having difficulty identifying the month and day. She now knows the current year.  Pt ambulated back to room and sat in enclosure bed with telesitter on and all needs met.   Therapy Documentation Precautions:  Precautions Precautions: Fall Precaution Comments: seizures Restrictions Weight Bearing Restrictions: No  Pain: Pain Assessment Pain Scale: Faces Faces Pain Scale: No hurt ADL: ADL Eating: Set up Grooming: Setup Upper Body Bathing: Supervision/safety Where Assessed-Upper Body Bathing: Shower Lower Body Bathing: Contact guard Where Assessed-Lower Body Bathing: Shower Upper Body Dressing: Supervision/safety Where Assessed-Upper Body Dressing: Chair Lower Body Dressing: Contact guard Where Assessed-Lower Body Dressing: Chair Toileting: Contact guard Where Assessed-Toileting: Glass blower/designer: Therapist, music Method: Magazine features editor: Curator Method: Heritage manager: Civil engineer, contracting with back,Grab bars   Therapy/Group: Individual Therapy  [place] 10/15/2020, 8:29 AM

## 2020-10-15 NOTE — Progress Notes (Signed)
Speech Language Pathology Daily Session Note  Patient Details  Name: MANVIR THORSON MRN: [id number] Date of Birth: [birth year]  Today's Date: 10/15/2020 SLP Individual Time: 0730-0830 SLP Individual Time Calculation (min): 60 min  Short Term Goals: Week 1: SLP Short Term Goal 1 (Week 1): Patient will demonstrate intellectual awareness to her deficits in cognition and language with modA. SLP Short Term Goal 2 (Week 1): Patient will repair errors in naming/describing and semantic errors during structured language tasks with min-modA cues. SLP Short Term Goal 3 (Week 1): Patient will tolerate current diet (Dys 2 solids) and thin liquids as well as trials of Dys 3 solids without overt s/s aspiration or penetration. SLP Short Term Goal 4 (Week 1): Patient will perform divergent and convergent naming with modA cues. SLP Short Term Goal 5 (Week 1): Patient will identify absurdities and problems in pictures/photos and describe at least one solution, with min-modA  Skilled Therapeutic Interventions: Skilled SLP intervention focused on verbal expression and dysphagia. Pt perseverated on "needing more sleep" when answering further questions asked by SLP. Confrontational naming task completed with 70% accuracy. Errors were semantic paraphasias or peseverations on previously named items. Pt consumed dys 2 breakfast tray with adequate mastication and oral clearance. No overt s/sx of aspiration or penetration noted with meal this session. Trial dys 3 breakfast tray next session. Cont with therapy per plan of care.      Pain Pain Assessment Pain Scale: Faces Faces Pain Scale: No hurt  Therapy/Group: Individual Therapy  Darrol Poke Sayeed Weatherall 10/15/2020, 8:28 AM

## 2020-10-15 NOTE — Progress Notes (Signed)
Physical Therapy Session Note  Patient Details  Name: Natalie [name] MRN: [id number] Date of Birth: 27-Jul-1955  Today's Date: 10/15/2020 PT Individual Time: 7106-2694 PT Individual Time Calculation (min): 54 min   Short Term Goals: Week 1:  PT Short Term Goal 1 (Week 1): STG = LTG due to ELOS  Skilled Therapeutic Interventions/Progress Updates:    Pt greeted seated EOB at start of session with family (sister and brother in law) at bedside. Pt agreeable to PT session. Pt donned shoes with setupA while seated EOB. Completed sit<>stand with supervision to RW. Ambulated from her room all the way downstairs (via elevator) past the At[facility], outside with supervision and her RW, >729ft. Pt denies any shortness of breath or fatigue once outdoors. Performed gait training on unlevel concrete surfaces with supervision and RW, 312ft outdoors. Pt noted to be significantly distractable while outdoors, often loosing her train of thought and not completing her sentences. Distracted by people and sounds. When asked who was present in the room ~15 minutes after start of session, pt providing inconsistent responses to which family member was present, indicating poor short term memory. She also frequently mistaken her room in the rehab unit for her "house." She then practiced ambulating in the [employer] with her RW and supervision, demonstrating ability to reach for various objects above her and around her without losing her balance. She also demonstrated good ability to navigate the [employer] appropriately without running into objects or barriers. She then ambulated back upstairs to main rehab gym with supervision and RW, >777ft. Pt then completed NMR with BITS system by performing trail making task #1-#25. She required max cues for correct sequencing and again, gets easily distracted by #'s on screen and sounds made by BITS system. Pt ambulated back to her room, ~266ft, with supervision and RW. Pt was able to recall room # but  needing max cues for locating her room. She ended session seated on edge of bed. RN reports that team is attempting to "wean" canopy bed and pt has been having canopy bed open all day. RN in agreement to leave pt sitting EOB without enclosure. Pt ended session seated EOB with RN present for routine medications. Needs within reach.  Therapy Documentation Precautions:  Precautions Precautions: Fall Precaution Comments: seizures Restrictions Weight Bearing Restrictions: No General:    Therapy/Group: Individual Therapy  Lanore Renderos P Allante Whitmire PT 10/15/2020, 7:37 AM

## 2020-10-15 NOTE — Progress Notes (Signed)
Patient maintained in open enclosure bed with no risk for safety or wandering.Patient  has stayed in room with intermittent supervision for entire shift. This nurse recommends discontinuing enclosure bed

## 2020-10-15 NOTE — Progress Notes (Signed)
MEDICATION RELATED CONSULT NOTE - INITIAL   Pharmacy Consult for  Methadone taper Indication: chronic pain  Allergies  Allergen Reactions  . Sulfasalazine Shortness Of Breath  . Aspirin   . Darvon [Propoxyphene]   . Iodine   . Penicillins   . Sulfa Antibiotics   . Zithromax [Azithromycin]     Patient Measurements: Weight: 48.3 kg (106 lb 7.7 oz)   Vital Signs: Temp: 97.6 F (36.4 C) (04/11 1328) Temp Source: Oral (04/11 1328) BP: 131/62 (04/11 1328) Pulse Rate: 66 (04/11 1328) Intake/Output from previous day: 04/10 0701 - 04/11 0700 In: 380 [P.O.:380] Out: -  Intake/Output from this shift: Total I/O In: 480 [P.O.:480] Out: -   Labs: Recent Labs    10/15/20 0700  WBC 5.0  HGB 13.2  HCT 40.4  PLT 288  CREATININE 1.04*   Estimated Creatinine Clearance: 42.2 mL/min (A) (by C-G formula based on SCr of 1.04 mg/dL (H)).   Microbiology: Recent Results (from the past 720 hour(s))  Resp Panel by RT-PCR (Flu A&B, Covid) Nasopharyngeal Swab     Status: None   Collection Time: 09/28/20  3:55 PM   Specimen: Nasopharyngeal Swab; Nasopharyngeal(NP) swabs in vial transport medium  Result Value Ref Range Status   SARS Coronavirus 2 by RT PCR NEGATIVE NEGATIVE Final    Comment: (NOTE) SARS-CoV-2 target nucleic acids are NOT DETECTED.  The SARS-CoV-2 RNA is generally detectable in upper respiratory specimens during the acute phase of infection. The lowest concentration of SARS-CoV-2 viral copies this assay can detect is 138 copies/mL. A negative result does not preclude SARS-Cov-2 infection and should not be used as the sole basis for treatment or other patient management decisions. A negative result may occur with  improper specimen collection/handling, submission of specimen other than nasopharyngeal swab, presence of viral mutation(s) within the areas targeted by this assay, and inadequate number of viral copies(<138 copies/mL). A negative result must be combined  with clinical observations, patient history, and epidemiological information. The expected result is Negative.  Fact Sheet for Patients:  EntrepreneurPulse.com.au  Fact Sheet for Healthcare Providers:  IncredibleEmployment.be  This test is no t yet approved or cleared by the [place] FDA and  has been authorized for detection and/or diagnosis of SARS-CoV-2 by FDA under an Emergency Use Authorization (EUA). This EUA will remain  in effect (meaning this test can be used) for the duration of the COVID-19 declaration under Section 564(b)(1) of the Act, 21 U.S.C.section 360bbb-3(b)(1), unless the authorization is terminated  or revoked sooner.       Influenza A by PCR NEGATIVE NEGATIVE Final   Influenza B by PCR NEGATIVE NEGATIVE Final    Comment: (NOTE) The Xpert Xpress SARS-CoV-2/FLU/RSV plus assay is intended as an aid in the diagnosis of influenza from Nasopharyngeal swab specimens and should not be used as a sole basis for treatment. Nasal washings and aspirates are unacceptable for Xpert Xpress SARS-CoV-2/FLU/RSV testing.  Fact Sheet for Patients: EntrepreneurPulse.com.au  Fact Sheet for Healthcare Providers: IncredibleEmployment.be  This test is not yet approved or cleared by the [place] FDA and has been authorized for detection and/or diagnosis of SARS-CoV-2 by FDA under an Emergency Use Authorization (EUA). This EUA will remain in effect (meaning this test can be used) for the duration of the COVID-19 declaration under Section 564(b)(1) of the Act, 21 U.S.C. section 360bbb-3(b)(1), unless the authorization is terminated or revoked.  Performed at [facility], [address], [place], [place] [address]   Culture, Respiratory w  Gram Stain     Status: None   Collection Time: 09/29/20  3:41 AM   Specimen: Tracheal Aspirate; Respiratory  Result Value Ref Range Status   Specimen  Description   Final    TRACHEAL ASPIRATE Performed at [facility], [address] [place], [place] [address]    Special Requests   Final    NONE Performed at [facility], [address] [place], [place] [address]    Gram Stain NO WBC SEEN MODERATE GRAM POSITIVE RODS   Final   Culture   Final    FEW Normal respiratory flora-no Staph aureus or Pseudomonas seen Performed at [facility] [address], [place], [place] [address]    Report Status 10/01/2020 FINAL  Final  MRSA PCR Screening     Status: None   Collection Time: 09/29/20 11:09 AM   Specimen: Nasopharyngeal  Result Value Ref Range Status   MRSA by PCR NEGATIVE NEGATIVE Final    Comment:        The GeneXpert MRSA Assay (FDA approved for NASAL specimens only), is one component of a comprehensive MRSA colonization surveillance program. It is not intended to diagnose MRSA infection nor to guide or monitor treatment for MRSA infections. Performed at [facility], [address] [place], [place] [address]   CSF culture w Stat Gram Stain     Status: None   Collection Time: 09/30/20  1:33 PM   Specimen: CSF; Cerebrospinal Fluid  Result Value Ref Range Status   Specimen Description   Final    CSF Performed at [facility], [address], [place], [place] [address]    Special Requests   Final    NONE Performed at [facility], [place], [place] [address]    Gram Stain   Final    WBC PRESENT,BOTH PMN AND MONONUCLEAR NO ORGANISMS SEEN CYTOSPIN SMEAR    Culture   Final    NO GROWTH 3 DAYS Performed at [facility], [place] [address], [place], [place] [address]    Report Status 10/03/2020 FINAL  Final    Medical History: Past Medical History:  Diagnosis Date  . Cataracts, bilateral   . Chickenpox cataracts  . COPD (chronic obstructive pulmonary disease) ([place])   . Depression   . Drug addiction ([place])   . Headache   . Hepatitis C  virus infection   . Hepatitis C without hepatic coma   . History of chicken pox   . History of colon polyps   . Seizures [facility])      Assessment: 64 y.o female on chronic methadone for chronic pain.   Admitted to  [facility] on 09/28/20  then transferred to [facility] hospital 09/30/20 with acute encephalopathy and suspicion of a status epilepticus. Discharged from [facility] and admitted to inpatient Rehab (CIR) on 10/11/20.    Also has noted history of drug addiction/substance abuse.  [facility]14[facility]  indicates patient on Methadone 140 mg daily.  However, [facility] discharge summary note discharged on 70 mg daily.  Prior to admission to CIR, Dr. Verlon Au noted patient has been on Methadone many years , referenced methadone clinic for help and that the patient was going to [facility].   At recent [facility] admission patient received Methadone 25 mg po every 8 hours.   On 10/14/20 methadone was decreased to 20 mg po q8h.   If on methadone for chronic pain see recommendations  below to taper dose down on a weekly basis.    However, if on methadone for substance abuse, the physician should contact the methadone clinic [facility] for methadone dose taper recommendations.     Recommendation:  If patient was taking methadone for substance abuse/drug addiction, the physician should contact the methadone clinic [facility] for tapering recommendations.    If on methadone for chronic pain: I recommend to  continue current methadone dose of 20 mg po every 8 hours x 1 week Then 15 mg po q8hr x 1 week  Then 10 mg po q6hr (4 times daily)  x 1 week Then 10 mg po q8hr (3 times daily) x 1 week Then 10 mg q12hr (2 x daily x 1 week  Then 10 mg daily x 1 week, then stop. Consider adding other pain medication if needed for pain..    Thank you for allowing pharmacy to be part of this patients care team.  Nicole Cella, [place] Pharmacist [phone] Please  check AMION for all [place] phone numbers After 10:00 PM, call [place] [phone] 10/15/2020,3:22 PM

## 2020-10-16 NOTE — Progress Notes (Signed)
Physical Therapy Session Note  Patient Details  Name: Natalie [name] MRN: [id number] Date of Birth: [birth year]  Today's Date: 10/16/2020 PT Individual Time: 1400-1453 PT Individual Time Calculation (min): 53 min   Short Term Goals: Week 1:  PT Short Term Goal 1 (Week 1): STG = LTG due to ELOS  Skilled Therapeutic Interventions/Progress Updates:    Pt greeted supine in open enclosure bed, sleeping but awakens easily to voice. She's agreeable to therapy but needs a few minutes to "wake up." Pt pleasant and cooperative throughout session. Supine<>sit indep without bed features. Doned slip-on shoes with setupA while seated EOB. Pt with verbal perseveration on "sleeping" initially during session. For example, when pt was beginning to leave her room, she kept repeating "do I need my sleep before we leave?" and "do you know where I put my sleep?" Possible anomia vs aphasia. Pt reporting need to void - sit<>stand with supervision and no AD and ambulated to her bathroom where she was continent of bladder. Ambulated from her room to day room gym, ~165ft, with CGA and no AD. Once arriving in ortho gym, pt expressing urgent need to have a BM. Ambulated to nearest bathroom, ~152ft, with CGA and no AD. Pt needed ++ time to produce successful BM, encouraged her not to strain as pt noted to have difficulty. Pt then performed gait training with obstacle course with minA and no AD - obstacle course included weaving in/out of cones and stepping over 5inch hurdles and 1inch poles on the ground. Pt was then instructed to pick up the cones/hurdles/poles which she did with CGA. Ambulated to main rehab gym and provided pt with a SPC to improve unsteadiness with gait as pt reports she used a cane prior to hospitalization. Gait training 282ft with supervision and SPC - pt with poor safety awareness with SPC and would often just hover the cane over ground rather than placing it in sequential manner. In ADL apartment room, instructed  pt to remember and locate x3 items (Jello, mixing bowl, measuring cup). Pt able to recall "jello bowl" but unable to locate the items without max cueing and was unable to remember the other 2 items despite max cueing, poor short term memory and abitlity to retrieve newly learned items. Performed furniture transfer from low sitting sofa couch with supervision and SPC. Pt then ambulated back to her room with supervision and SPC - similar deficits as outlined above. Pt with regular hospital bed now instead of enclosure bed. Bed mobility completed mod I. Pt ended session supine in bed with bed alarm on and needs within reach.  Therapy Documentation Precautions:  Precautions Precautions: Fall Precaution Comments: seizures Restrictions Weight Bearing Restrictions: No General:    Therapy/Group: Individual Therapy  Derriana Oser P Keevon Henney PT 10/16/2020, 7:35 AM

## 2020-10-16 NOTE — Progress Notes (Signed)
[facility] PHYSICAL MEDICINE & REHABILITATION PROGRESS NOTE  Subjective/Complaints: No complaints this morning Denies pain Enclosure bed d/ced Bradycardic  ROS: denies CP, shortness of breath, nausea, vomiting, diarrhea, pain  Objective: Vital Signs: Blood pressure 113/60, pulse (!) 54, temperature 98 F (36.7 C), temperature source Oral, resp. rate 18, weight 48.3 kg, SpO2 94 %. No results found. Recent Labs    10/15/20 0700  WBC 5.0  HGB 13.2  HCT 40.4  PLT 288   Recent Labs    10/15/20 0700  NA 139  K 3.8  CL 101  CO2 30  GLUCOSE 93  BUN 21  CREATININE 1.04*  CALCIUM 9.6    Intake/Output Summary (Last 24 hours) at 10/16/2020 1032 Last data filed at 10/16/2020 0825 Gross per 24 hour  Intake 660 ml  Output --  Net 660 ml        Physical Exam: BP 113/60 (BP Location: Left Arm)   Pulse (!) 54   Temp 98 F (36.7 C) (Oral)   Resp 18   Wt 48.3 kg   SpO2 94%   BMI 18.86 kg/m  Gen: no distress, normal appearing HEENT: oral mucosa pink and moist, NCAT Cardio: Reg rate Chest: normal effort, normal rate of breathing Abd: soft, non-distended Ext: no edema Psych: Normal mood.  Slightly impulsive. Musc: No edema in extremities.  No tenderness in extremities. Neuro: Alert and oriented x2, not oriented to time , full ROM without pain  Motor: Grossly 5/5 throughout   Assessment/Plan: 1. Functional deficits which require 3+ hours per day of interdisciplinary therapy in a comprehensive inpatient rehab setting.  Physiatrist is providing close team supervision and 24 hour management of active medical problems listed below.  Physiatrist and rehab team continue to assess barriers to discharge/monitor patient progress toward functional and medical goals   Care Tool:  Bathing    Body parts bathed by patient: Right arm,Left arm,Chest,Abdomen,Front perineal area,Buttocks,Right upper leg,Left upper leg,Right lower leg,Left lower leg,Face         Bathing assist  Assist Level: Contact Guard/Touching assist     Upper Body Dressing/Undressing Upper body dressing   What is the patient wearing?: Pull over shirt    Upper body assist Assist Level: Supervision/Verbal cueing    Lower Body Dressing/Undressing Lower body dressing      What is the patient wearing?: Underwear/pull up,Pants     Lower body assist Assist for lower body dressing: Contact Guard/Touching assist     Toileting Toileting    Toileting assist Assist for toileting: Set up assist     Transfers Chair/bed transfer  Transfers assist     Chair/bed transfer assist level: Supervision/Verbal cueing     Locomotion Ambulation   Ambulation assist      Assist level: Contact Guard/Touching assist Assistive device: Walker-rolling Max distance: 168ft   Walk 10 feet activity   Assist     Assist level: Contact Guard/Touching assist Assistive device: Walker-rolling   Walk 50 feet activity   Assist    Assist level: Contact Guard/Touching assist Assistive device: Walker-rolling    Walk 150 feet activity   Assist    Assist level: Contact Guard/Touching assist Assistive device: Walker-rolling    Walk 10 feet on uneven surface  activity   Assist     Assist level: Contact Guard/Touching assist Assistive device: Walker-rolling   Wheelchair     Assist Will patient use wheelchair at discharge?: No   Wheelchair activity did not occur: N/A  Wheelchair 50 feet with 2 turns activity    Assist    Wheelchair 50 feet with 2 turns activity did not occur: N/A       Wheelchair 150 feet activity     Assist  Wheelchair 150 feet activity did not occur: N/A        Medical Problem List and Plan: 1.  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  Impaired mobility: -DVT/anticoagulation:  Pharmaceutical: d/c Lovenox since ambulating >300 feet              -antiplatelet therapy: Plavix 3. Chronic pain/Pain Management:   4/12: continue current methadone dose of 20 mg po every 8 hours x 1 week Then 15 mg po q8hr x 1 week (change on Sunday) Then 10 mg po q6hr (4 times daily)  x 1 week Then 10 mg po q8hr (3 times daily) x 1 week Then 10 mg q12hr (2 x daily x 1 week  Then 10 mg daily x 1 week, then stop. Consider adding other pain medication if needed for pain..  4. Mood: LCSW to follow for evaluation and support.             -antipsychotic agents: N/A 5. Neuropsych: This patient is not capable of making decisions on her own behalf. 6. Skin/Wound Care: Routine pressure relief measures.  7. Fluids/Electrolytes/Nutrition: Monitor I/Os. 8. Vitamin B12 deficiency: Continue po supplement.  9. Thiamine deficiency:  Continue po thiamine daily.  10. CKD stage III: SCr 1.2 at baseline.   Creatinine 1.11 on 4/8, down to 1.04 on 4/11 11. COPD: Was using oxygen 3L per [facility] in addition to nebs (Per family/record review)  Will resume at 1 liter and titrate as needed.  Nebs prn.   Wean as tolerated 12. Encephalopathy: d/c enclosure bed 13. Aphasia/confusion- as per #12.  14.  Hypoalbuminemia  Supplement initiated on 4/8. Diet order edited to request high protein foods.   15.  Post stroke dysphagia  D1 thins, advance diet as tolerated   LOS: 5 days A FACE TO FACE EVALUATION WAS PERFORMED  Martha Clan P Jaidalyn Schillo 10/16/2020, 10:32 AM

## 2020-10-16 NOTE — Progress Notes (Signed)
Speech Language Pathology Daily Session Note  Patient Details  Name: Natalie [name] MRN: [id number] Date of Birth: May 11, 1956  Today's Date: 10/16/2020 SLP Individual Time: 2423-5361 SLP Individual Time Calculation (min): 45 min  Short Term Goals: Week 1: SLP Short Term Goal 1 (Week 1): Patient will demonstrate intellectual awareness to her deficits in cognition and language with modA. SLP Short Term Goal 2 (Week 1): Patient will repair errors in naming/describing and semantic errors during structured language tasks with min-modA cues. SLP Short Term Goal 3 (Week 1): Patient will tolerate current diet (Dys 2 solids) and thin liquids as well as trials of Dys 3 solids without overt s/s aspiration or penetration. SLP Short Term Goal 4 (Week 1): Patient will perform divergent and convergent naming with modA cues. SLP Short Term Goal 5 (Week 1): Patient will identify absurdities and problems in pictures/photos and describe at least one solution, with min-modA  Skilled Therapeutic Interventions:Skilled ST services focused on cognitive skills. Pt was sitting edge of posey bed (vail open) consuming breakfast. Pt agree to disorganized thought and demonstrated intermittent language confusion with little awareness of errors. Pt was only orientated to self, vague situation "fall" and month intermittently. SLP posted visual aids in room, after 7 trials and only with a 2 minute delay pt was orientated to place, situation, time, except day/date with initial cue to utilize visual aids I room. Prior to the 7th trial pt required max A fade to mod A verbal cues to utilize aids and to use aids correctly. Pt was able to accurately describe action picture cards with 60% accuracy at phrase level mod I, pt demonstrated 90% accuracy with mod A semantic/sentennce completion cues and able to self-correct 1 out 4 verbal errors. Pt was unable to complete divergent naming task (listing animals) even with total A, due to reduced  ability to understand concept.  Pt ws left in room with NT, assisting pt to bathroom upon request. Recommends to continue skilled services.     Pain Pain Assessment Pain Scale: 0-10 Pain Score: 0-No pain  Therapy/Group: Individual Therapy  Janelle Spellman  [facility] 10/16/2020, 11:39 AM

## 2020-10-16 NOTE — Progress Notes (Signed)
Occupational Therapy Session Note  Patient Details  Name: Natalie [name] MRN: [id number] Date of Birth: Oct 21, 1955  Today's Date: 10/16/2020 OT Individual Time: 1000-1100 OT Individual Time Calculation (min): 60 min    Short Term Goals: Week 1:  OT Short Term Goal 1 (Week 1): STGs = LTGs  Skilled Therapeutic Interventions/Progress Updates:    Pt received in room and agreeable to therapy and a shower. Pt ambulated around room with no AD with no LOB.  She gathered supplies from cabinets and drawers, but did have difficulty with sequencing and organizing. For example, she would gather 4 pairs of socks and say "well I am hanging on to them for later", she would put 2 pairs back, get a pair back out.  This type of difficulty focusing was repeated with other tasks such as gathering supplies for her shower.  Pt able to toilet, shower and dress all with distant S.  She picked up all the towels off of the floor and put them in the laundry bag.   She then made up her bed walking around the bed, fixing the bedding. Pt demonstrated good balance and endurance.   Drew pt a map of how to find the ADL apt. Pt given the map and she ambulate from her room to the apt with a good pace.  In kitchen, discussed the types of food pt cooks and how she prepares her food. She did have difficulty giving specific detail but was able to explain in basic terms. Pt ambulated back to room but once in the hallway by her room needed cues to look at room number on map and find on the wall.  She seemed to have difficulty interpreting the numbers.  Kept saying W18 was "W I E".  Pt opted to rest on her bed. Pt in room with all needs met.   Therapy Documentation Precautions:  Precautions Precautions: Fall Precaution Comments: seizures Restrictions Weight Bearing Restrictions: No  Pain: Pain Assessment Pain Score: 0-No pain ADL: ADL Eating: Set up Grooming: Setup Upper Body Bathing: Supervision/safety Where Assessed-Upper  Body Bathing: Shower Lower Body Bathing: Supervision/safety Where Assessed-Lower Body Bathing: Shower Upper Body Dressing: Supervision/safety Where Assessed-Upper Body Dressing: Chair Lower Body Dressing: Supervision/safety Where Assessed-Lower Body Dressing: Chair Toileting: Supervision/safety Where Assessed-Toileting: Glass blower/designer: Distant supervision Armed forces technical officer Method: Magazine features editor: Close supervision Social research officer, government Method: Heritage manager: Civil engineer, contracting with back,Grab bars  Therapy/Group: Individual Therapy  [place] 10/16/2020, 10:40 AM

## 2020-10-17 MED ORDER — METHADONE HCL 10 MG PO TABS: 10.0000 mg | ORAL_TABLET | Freq: Four times a day (QID) | ORAL | Status: DC

## 2020-10-17 MED ORDER — METHADONE HCL 10 MG PO TABS
10.0000 mg | ORAL_TABLET | Freq: Every day | ORAL | Status: DC
Start: 2020-11-18 — End: 2020-10-19

## 2020-10-17 MED ORDER — METHADONE HCL 10 MG PO TABS
10.0000 mg | ORAL_TABLET | Freq: Three times a day (TID) | ORAL | Status: DC
Start: 2020-11-04 — End: 2020-10-19

## 2020-10-17 MED ORDER — METHADONE HCL 10 MG PO TABS: 15.0000 mg | ORAL_TABLET | Freq: Three times a day (TID) | ORAL | Status: DC

## 2020-10-17 MED ORDER — METHADONE HCL 10 MG PO TABS: 10.0000 mg | ORAL_TABLET | Freq: Two times a day (BID) | ORAL | Status: DC

## 2020-10-17 NOTE — Progress Notes (Signed)
Physical Therapy Session Note  Patient Details  Name: Natalie [name] MRN: [id number] Date of Birth: 07/05/1956  Today's Date: 10/17/2020 PT Individual Time: 0803-0900 PT Individual Time Calculation (min): 57 min   Short Term Goals: Week 1:  PT Short Term Goal 1 (Week 1): STG = LTG due to ELOS  Skilled Therapeutic Interventions/Progress Updates:  Patient seated upright on EOB and eating breakfast upon PT arrival. Patient alert and states she is agreeable to PT session after she completes breakfast and will wait to change clothes until 10:00. Then pt asks PT to place breakfast tray in hallway as she needs to get ready. Patient denied pain during session.  Therapeutic Activity: Transfers: Patient performed STS and SPVT transfers throughout session with supervision. Pt does not need vc for technique but vc are required for safety awareness as pt reaches out for steadying at tray table and other items of furniture around room. Pt guided in gathering items of clothing requiring assistance with each item as she perseverates on top and pants only with consistent removal and replacement of items in drawer. Very focused on cleaning room and tidying surfaces. Also guided in making of bed. Bed adjusted to height of home bed as per pt's instructions and she is able to make bed with supervision and vc only for safety awareness in positioning for far reaches outside of BOS. Pt then brings day's clothes to bed and is able to change outfit with no physical assist and requires guidance only in collecting worn items and placing them in bag for family to take to wash.   Throughout session, pt consistently chooses incorrect nouns even while looking at object, for example, uses "toothbrush" instead of "cane".  Gait Training:  Patient ambulated >300 feet using SPC in LUE with supervision. Ambulated with good stride and pace with upright posture. Easily visually distracted and stops amb/ activity to look at distraction.  Provided verbal cues for maintaining focus for safe path.  Neuromuscular Re-ed: NMR facilitated during session with focus on dynamic standing balance and dynamic gait. Pt guided in obstacle course including pliant surfaces, 6" hurdles, 5 cones placed for tight turns and 4" step used for wide balance beam. No LOB noted, one instance of catch of trailing foot on hurdle with pt able to correct throughout remaining attempts. NMR performed for improvements in motor control and coordination, balance, sequencing, judgement, and self confidence/ efficacy in performing all aspects of mobility at highest level of independence.   Patient seated on EOB at end of session with brakes locked, bed alarm set, NT notified as to pt's status, and all needs within reach.   Therapy Documentation Precautions:  Precautions Precautions: Fall Precaution Comments: seizures Restrictions Weight Bearing Restrictions: No  Therapy/Group: Individual Therapy  Alger Simons PT, DPT 10/17/2020, 8:12 AM

## 2020-10-17 NOTE — Progress Notes (Signed)
Occupational Therapy Session Note  Patient Details  Name: Natalie [name] MRN: [id number] Date of Birth: Jun 24, 1956  Today's Date: 10/17/2020 OT Individual Time: 1505-1600 OT Individual Time Calculation (min): 55 min    Short Term Goals: Week 1:  OT Short Term Goal 1 (Week 1): STGs = LTGs  Skilled Therapeutic Interventions/Progress Updates:    Pt received sitting up in her bed. Pt agreeable to therapy.  Yesterday we talked about what she cooks at home and she said she prepares eggs and bacon frequently.  Pt followed a map to find the kitchen in the ADL apt with min cues. Ambulated with S.  In kitchen, demonstrated to pt where supplies are kept (pans, spatula, eggs, bread, etc).  Cued pt to start tasks to make either a fried egg or scrambled egg. She needed significant cues with every step of the task as she was very confused about what to do. She took 4 slices of bread out and placed them on the counter and tried to put the whole uncooked eggs right on the bread.  When asked to crack the egg, she said "I have no idea how to do that".  I appears much of this is language confusion, unfamiliar environment, uncertainty of what is being asked of her.  I feel she will do much better in her home environment with [place].  Pt very cooperative but clearly struggled with understanding what to do.  When asked to wash the dishes with a cloth and soap, she did so with no problem.    Pt then ambulated to gym and continued to work in standing as she completed a simple puzzle push pin design with min cues and a pipe tree puzzle with mod cues.  Discussed the need for full S at home as she works on her normal activities.  Pt ambulated back to room and sat back in her bed. Pt participated well.  Bed alarm set.      Therapy Documentation Precautions:  Precautions Precautions: Fall Precaution Comments: seizures Restrictions Weight Bearing Restrictions: No   Pain: Pain Assessment Pain Scale: 0-10 Pain Score: 0-No  pain ADL: ADL Eating: Set up Grooming: Setup Upper Body Bathing: Supervision/safety Where Assessed-Upper Body Bathing: Shower Lower Body Bathing: Supervision/safety Where Assessed-Lower Body Bathing: Shower Upper Body Dressing: Supervision/safety Where Assessed-Upper Body Dressing: Chair Lower Body Dressing: Supervision/safety Where Assessed-Lower Body Dressing: Chair Toileting: Supervision/safety Where Assessed-Toileting: Glass blower/designer: Distant supervision Armed forces technical officer Method: Magazine features editor: Close supervision Social research officer, government Method: Heritage manager: Civil engineer, contracting with back,Grab bars   Therapy/Group: Individual Therapy  [place] 10/17/2020, 12:53 PM

## 2020-10-17 NOTE — Progress Notes (Addendum)
[place] PHYSICAL MEDICINE & REHABILITATION PROGRESS NOTE  Subjective/Complaints: Team conference today Bradycardic Very distractable I will call son today  denies CP, shortness of breath, nausea, vomiting, diarrhea, pain  Objective: Vital Signs: Blood pressure 115/61, pulse (!) 56, temperature 98 F (36.7 C), temperature source Oral, resp. rate 16, weight 48.3 kg, SpO2 92 %. No results found. Recent Labs    10/15/20 0700  WBC 5.0  HGB 13.2  HCT 40.4  PLT 288   Recent Labs    10/15/20 0700  NA 139  K 3.8  CL 101  CO2 30  GLUCOSE 93  BUN 21  CREATININE 1.04*  CALCIUM 9.6    Intake/Output Summary (Last 24 hours) at 10/17/2020 1036 Last data filed at 10/16/2020 1853 Gross per 24 hour  Intake 240 ml  Output --  Net 240 ml        Physical Exam: BP 115/61 (BP Location: Left Arm)   Pulse (!) 56   Temp 98 F (36.7 C) (Oral)   Resp 16   Wt 48.3 kg   SpO2 92%   BMI 18.86 kg/m  Gen: no distress, normal appearing HEENT: oral mucosa pink and moist, NCAT Cardio: Bradycardic Chest: normal effort, normal rate of breathing Abd: soft, non-distended Ext: no edema Psych: pleasant, normal affect Psych: Normal mood.  Slightly impulsive. Musc: No edema in extremities.  No tenderness in extremities. Neuro: Alert and oriented x2, not oriented to time , full ROM without pain  Motor: Grossly 5/5 throughout. Ambulating well in her room getting her clothes.   Assessment/Plan: 1. Functional deficits which require 3+ hours per day of interdisciplinary therapy in a comprehensive inpatient rehab setting.  Physiatrist is providing close team supervision and 24 hour management of active medical problems listed below.  Physiatrist and rehab team continue to assess barriers to discharge/monitor patient progress toward functional and medical goals   Care Tool:  Bathing    Body parts bathed by patient: Right arm,Left arm,Chest,Abdomen,Front perineal area,Buttocks,Right upper  leg,Left upper leg,Right lower leg,Left lower leg,Face         Bathing assist Assist Level: Supervision/Verbal cueing     Upper Body Dressing/Undressing Upper body dressing   What is the patient wearing?: Pull over shirt,Bra    Upper body assist Assist Level: Set up assist    Lower Body Dressing/Undressing Lower body dressing      What is the patient wearing?: Underwear/pull up,Pants     Lower body assist Assist for lower body dressing: Supervision/Verbal cueing     Toileting Toileting    Toileting assist Assist for toileting: Supervision/Verbal cueing     Transfers Chair/bed transfer  Transfers assist     Chair/bed transfer assist level: Supervision/Verbal cueing     Locomotion Ambulation   Ambulation assist      Assist level: Contact Guard/Touching assist Assistive device: Walker-rolling Max distance: 191ft   Walk 10 feet activity   Assist     Assist level: Contact Guard/Touching assist Assistive device: Walker-rolling   Walk 50 feet activity   Assist    Assist level: Contact Guard/Touching assist Assistive device: Walker-rolling    Walk 150 feet activity   Assist    Assist level: Contact Guard/Touching assist Assistive device: Walker-rolling    Walk 10 feet on uneven surface  activity   Assist     Assist level: Contact Guard/Touching assist Assistive device: Walker-rolling   Wheelchair     Assist Will patient use wheelchair at discharge?: No   Wheelchair activity did  not occur: N/A         Wheelchair 50 feet with 2 turns activity    Assist    Wheelchair 50 feet with 2 turns activity did not occur: N/A       Wheelchair 150 feet activity     Assist  Wheelchair 150 feet activity did not occur: N/A        Medical Problem List and Plan: 1.  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  Impaired  mobility: -DVT/anticoagulation:  Pharmaceutical: d/c Lovenox since ambulating >300 feet             -antiplatelet therapy: Continue Plavix 75mg  daily.  3. Chronic pain/Pain Management:   4/12: continue current methadone dose of 20 mg po every 8 hours x 1 week Then 15 mg po q8hr x 1 week (change on Sunday) Then 10 mg po q6hr (4 times daily)  x 1 week Then 10 mg po q8hr (3 times daily) x 1 week Then 10 mg q12hr (2 x daily x 1 week  Then 10 mg daily x 1 week, then stop.  4. Mood: LCSW to follow for evaluation and support.             -antipsychotic agents: N/A 5. Neuropsych: This patient is not capable of making decisions on her own behalf. 6. Skin/Wound Care: Routine pressure relief measures.  7. Fluids/Electrolytes/Nutrition: Monitor I/Os. 8. Vitamin B12 deficiency: Continue po supplement.  9. Thiamine deficiency:  Continue po thiamine daily.  10. CKD stage III: SCr 1.2 at baseline.   Creatinine 1.11 on 4/8, down to 1.04 on 4/11 11. COPD: Was using oxygen 3L per [place] in addition to nebs (Per family/record review)  Will resume at 1 liter and titrate as needed.  Nebs prn.   Weaned 12. Encephalopathy: d/c enclosure bed. Repeat UA/UC.  13. Aphasia/confusion- as per #12.  14.  Hypoalbuminemia  Supplement initiated on 4/8. Diet order edited to request high protein foods.   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 Disposition: ready for d/c. Discussed with son.   >35 minutes spent in assessing patient, discussing patient's progress with team, answering son's questions regarding her condition, how to obtain medical records, diet, continued outpatient therapy, confusion, assessing for UTI, >50% spent in care and coordination of care.   LOS: 6 days A FACE TO FACE EVALUATION WAS PERFORMED  Clide Deutscher Kanae Ignatowski 10/17/2020, 10:36 AM

## 2020-10-17 NOTE — Progress Notes (Signed)
Physical Therapy Session Note  Patient Details  Name: NHYIRA LEANO MRN: [id number] Date of Birth: June 18, 1956  Today's Date: 10/17/2020 PT Individual Time: 0930-0956 PT Individual Time Calculation (min): 26 min   Short Term Goals: Week 1:  PT Short Term Goal 1 (Week 1): STG = LTG due to ELOS  Skilled Therapeutic Interventions/Progress Updates:    Patient received sitting up in bed, agreeable to PT. She denies pain. Patient able to ambulate >227ft to far therapy gym with [facility] and supervision. No LOB noted, but patient not using SPC appropriately- occasionally just hovering the cane over the ground. Patient with poor ability to complete dual task practice while ambulating to therapy gym. She would stop to talk or was unable to follow simple motor commands while ambulating without becoming confused at instructions. Patient completing NuStep for 8 mins on Level 3, B LE only to encourage faster reciprocal movements. Decent carryover to gait when ambulating back to her room with slightly improved gait speed. Still with poor path finding and dual tasking. Patient returning to bed, bed alarm on, call light within reach.    Therapy Documentation Precautions:  Precautions Precautions: Fall Precaution Comments: seizures Restrictions Weight Bearing Restrictions: No    Therapy/Group: Individual Therapy  Karoline Caldwell, PT, DPT, CBIS  10/17/2020, 7:47 AM

## 2020-10-17 NOTE — Progress Notes (Signed)
Patient ID: Natalie [name], female   DOB: October 12, 1955, 65 y.o.   MRN: [id number] Follow up with the patient regarding educational needs. Patient is more alert and did well without the telesitter and enclosure bed. Patient is continent and able to request assistance to the restroom to avoid getting up solo and have the bed alarm sound. Reported she is feeling better. No questions other than "when do you think I can go home".  Continue to follow along to discharge to address education and review medications. Margarito Liner

## 2020-10-17 NOTE — Progress Notes (Signed)
Physical Therapy Session Note  Patient Details  Name: Natalie [name] MRN: [id number] Date of Birth: 1956-04-24  Today's Date: 10/17/2020 PT Individual Time: 1130-1155 PT Individual Time Calculation (min): 25 min   Short Term Goals: Week 1:  PT Short Term Goal 1 (Week 1): STG = LTG due to ELOS  Skilled Therapeutic Interventions/Progress Updates:    Pt greeted supine in bed and agreeable to therapy - no reports of pain. Pt reporting that therapist is early to appointment. When asked her what time it is, she reports 0730 (time was 1130). When asked her to look at analog clock in room, she continued to report 0730 despite it showing 1130.   Asked her to draw a clock showing "A quarter past 8." Drawing shown below. Pt with difficulty error recognition and unsure of how to correct drawing to accurately depict 8:15.      Bed mobility indep without bed features. Sit<>stand with supervision and no AD. Gait training with vs without SPC >200 ft with supervision. Pt with decreased awareness of SPC usage and would often just "float" cane over ground.   Gait training up/down ramp with CGA and no AD as well as over unlevel mulch in ortho gym with CGA and no AD. Also performed repeated step ups onto 6inch curb with CGA and unilateral hand rail support.   Pt then ambulated back to her room with supervision and no AD, gait speed decreased without formal LOB or knee buckling but mild unsteadiness noted with turns or distractions. Bed mobility completed indep without bed features. Pt ended session supine in bed with needs in reach and bed alarm on.    Therapy Documentation Precautions:  Precautions Precautions: Fall Precaution Comments: seizures Restrictions Weight Bearing Restrictions: No General:    Therapy/Group: Individual Therapy  Vick Filter P Saraya Tirey PT 10/17/2020, 7:25 AM

## 2020-10-17 NOTE — Patient Care Conference (Signed)
Inpatient RehabilitationTeam Conference and Plan of Care Update Date: 10/17/2020   Time: 11:13 AM    Patient Name: Natalie [name]      Medical Record Number: [id number]  Date of Birth: [birth year] Sex: Female         Room/Bed: 4W18C/4W18C-01 Payor Info: Payor: [place] / Plan: [employer] COMM PPO / Product Type: *No Product type* /    Admit Date/Time:  10/11/2020  1:57 PM  Primary Diagnosis:  Acute ischemic left MCA stroke [facility])  Hospital Problems: Principal Problem:   Acute ischemic left MCA stroke ([place]) Active Problems:   CKD (chronic kidney disease) stage 3, GFR 30-59 ml/min (HCC)   COPD (chronic obstructive pulmonary disease) (HCC)   Encephalopathy   Aphasia due to acute cerebrovascular accident (CVA) ([place])   Hypoalbuminemia due to protein-calorie malnutrition ([place])   Chronic pain syndrome   Dysphagia, post-stroke    Expected Discharge Date: Expected Discharge Date:  (D/C pending)  Team Members Present: Physician leading conference: Dr. Leeroy Cha Care Coodinator Present: Becky Dupree, LCSW;Beverely Suen Hervey Ard, RN, BSN, CRRN Nurse Present: Dorien Chihuahua, RN PT Present: Ginnie Smart, PT OT Present: Meriel Pica, OT SLP Present: Nadara Mode, SLP PPS Coordinator present : Gunnar Fusi, SLP     Current Status/Progress Goal Weekly Team Focus  Bowel/Bladder   Pt is continent x2.  Pt will remain continent x2.  Assess q shift and prn.   Swallow/Nutrition/ Hydration   Dys 3 textures and thin liquids,  Mod I  tolerance and trials of dys 3 textures   ADL's   supervision with self care and mobility - pt is at goal level but will need 24/7 S at home due to cognitive deficits and aphasia  supervision  pt/family education, therapeutic activity   Mobility   mod I bed mobility, mod I transfers, supervision gait and stairs (12)  supervision and mod I  DC planning, pt education, gait with LRAD, higher level balance, cog   Communication   Mod A  Supervision A receptive,  Min A expressive  awareness of verbal errors, divergent/convergent naming, picture description tasks, following basic commands   Safety/Cognition/ Behavioral Observations  max-mod A  Min-Supervision A  orientation, basic problem solving, error awarenes, sustained attention and intellectual awareness   Pain   Pt denies pain at this time.  Pain will remain <3.  Assess q shift and prn.   Skin   Skin has scattered bruising in various stages of healing.  Skin will continue to heal and remain infection free.  Assess q shift and prn.     Discharge Planning:  HOme with son who works from home but son anxious with her coming home and wanted to have her go to a facility from here. Asked son to come in for education unsure if will due to transportation issues   Team Discussion: Agitation and confusion improved. MD weaning methadone. Progress limited by issues with organization and mild cognitive issues.  Patient on target to meet rehab goals: yes, currently supervision for gait on unlevel surfaces and in an unfamiliar environment. Unsafe left alone due to problems with error awareness, basic problem solving, etc. Mod I for transfers and supervision goals set for discharge.   *See Care Plan and progress notes for long and short-term goals.   Revisions to Treatment Plan:   Teaching Needs: Safety cues, medication management, etc.   Current Barriers to Discharge: Decreased caregiver support, Home enviroment access/layout and son lives at the home with the patient but  doesn't drive = transportation issues Son has questions for the MD/PAC regarding patient care needs.  Possible Resolutions to Barriers: HH follow up services MD to follow up with son     Medical Summary Current Status: pain has been well controlled, wandering has improved, hypoalbuminemia, very distractable, poor safety awareness  Barriers to Discharge: Medical stability;Decreased family/caregiver support  Barriers to Discharge  Comments: on high dose methadone, hypoalbuminemia, very distractable, poor safety awareness Possible Resolutions to [employer] Focus: wean methadone slowly, d/c enclosure bed, encourage high protein diet, continue supplementations, will contact son as patient will require supervision upon d/c   Continued Need for Acute Rehabilitation Level of Care: The patient requires daily medical management by a physician with specialized training in physical medicine and rehabilitation for the following reasons: Direction of a multidisciplinary physical rehabilitation program to maximize functional independence : Yes Medical management of patient stability for increased activity during participation in an intensive rehabilitation regime.: Yes Analysis of laboratory values and/or radiology reports with any subsequent need for medication adjustment and/or medical intervention. : Yes   I attest that I was present, lead the team conference, and concur with the assessment and plan of the team.   Dorien Chihuahua B 10/17/2020, 2:10 PM

## 2020-10-18 NOTE — Progress Notes (Signed)
Speech Language Pathology Daily Session Note  Patient Details  Name: Natalie [name] MRN: [id number] Date of Birth: 08-20-1955  Today's Date: 10/18/2020 SLP Individual Time: 0730-0825 SLP Individual Time Calculation (min): 55 min  Short Term Goals: Week 1: SLP Short Term Goal 1 (Week 1): Patient will demonstrate intellectual awareness to her deficits in cognition and language with modA. SLP Short Term Goal 2 (Week 1): Patient will repair errors in naming/describing and semantic errors during structured language tasks with min-modA cues. SLP Short Term Goal 3 (Week 1): Patient will tolerate current diet (Dys 2 solids) and thin liquids as well as trials of Dys 3 solids without overt s/s aspiration or penetration. SLP Short Term Goal 4 (Week 1): Patient will perform divergent and convergent naming with modA cues. SLP Short Term Goal 5 (Week 1): Patient will identify absurdities and problems in pictures/photos and describe at least one solution, with min-modA  Skilled Therapeutic Interventions: Skilled SLP intervention focused on dysphagia and word finding. She continues to demonstrate peseverations but was able to complete sentences accurately regarding conversational  topics or actions cards when given an incomplete sentence for her to finish. She frequently produced words or actions reelated to previously shown cards when attempting to describe new cards. Pt tolerated dys 2 breakfast tray, dys 3 trials and thin liquids with adequate mastication and oral clearance with no overt s/sx of aspiration or penetration. Cont with therapy per plan of care.       Pain Pain Assessment Pain Scale: Faces Pain Score: 0-No pain Faces Pain Scale: No hurt  Therapy/Group: Individual Therapy  Natalie [name] 10/18/2020, 10:41 AM

## 2020-10-18 NOTE — Progress Notes (Signed)
Occupational Therapy Discharge Summary  Patient Details  Name: Natalie [name] MRN: [id number] Date of Birth: Jan 21, 1956   Patient has met 6 of 6 long term goals due to improved activity tolerance, improved balance, postural control, improved attention and improved awareness.  Patient to discharge at overall Supervision level within the home, although she is able to do all of her personal care (toileting, shower, dressing) independently. Initially, supervision with her shower transfers are recommended as is helping her get set up for her shower (towels, supplies, etc).   She needs assist with organizing, problem solving, memory - therefore all home and meal management tasks will need supervision.   Patient's care partner is independent to provide the necessary cognitive assistance at discharge.    Reasons goals not met:   Recommendation:  Patient will benefit from ongoing skilled OT services in home health setting to continue to advance functional skills in the area of iADL.  Equipment: No equipment provided - recommending to son to purchase a shower chair  Reasons for discharge: treatment goals met  Patient/family agrees with progress made and goals achieved: Yes  OT Discharge Precautions/Restrictions  Precautions Precautions: Fall Precaution Comments: short term memory, word-finding/anomia Restrictions Weight Bearing Restrictions: No   Pain Pain Assessment Pain Scale: Faces Pain Score: 0-No pain Faces Pain Scale: No hurt ADL ADL Eating: Independent Grooming: Independent Where Assessed-Grooming: Standing at sink Upper Body Bathing: Setup Where Assessed-Upper Body Bathing: Shower Lower Body Bathing: Setup Where Assessed-Lower Body Bathing: Shower Upper Body Dressing: Independent Where Assessed-Upper Body Dressing: Chair Lower Body Dressing: Independent Where Assessed-Lower Body Dressing: Chair Toileting: Independent Where Assessed-Toileting: Glass blower/designer:  Programmer, applications Method: Magazine features editor: Close supervision Social research officer, government Method: Heritage manager: Civil engineer, contracting with back,Grab bars Vision Baseline Vision/History: Wears glasses Wears Glasses: At all times Patient Visual Report: No change from baseline Vision Assessment?: No apparent visual deficits Perception  Perception: Within Functional Limits Praxis Praxis: Impaired Praxis Impairment Details: Ideation;Ideomotor;Motor planning Praxis-Other Comments: Performs better with functional > novel tasks. Distractability impacting performance Cognition Overall Cognitive Status: Impaired/Different from baseline Focused Attention: Appears intact Sustained Attention: Appears intact Memory: Impaired Memory Impairment: Decreased recall of new information;Retrieval deficit;Storage deficit Decreased Short Term Memory: Verbal basic;Functional basic Awareness: Impaired Awareness Impairment: Emergent impairment Problem Solving: Impaired Problem Solving Impairment: Verbal basic;Functional basic Executive Function: Sequencing;Organizing;Decision Making Sequencing: Impaired Sequencing Impairment: Manufacturing systems engineer: Impaired Organizing Impairment: Functional basic Decision Making: Impaired Decision Making Impairment: Functional basic Safety/Judgment: Impaired Comments: impaired for high level cognitive tasks such as cooking or using anything that could cause a burn Sensation Sensation Light Touch: Appears Intact Hot/Cold: Appears Intact Proprioception: Appears Intact Stereognosis: Appears Intact Coordination Gross Motor Movements are Fluid and Coordinated: Yes Fine Motor Movements are Fluid and Coordinated: Yes Motor  Motor Motor: Within Functional Limits    Trunk/Postural Assessment  Postural Control Postural Control: Within Functional Limits  Balance Static Sitting Balance Static Sitting - Level of Assistance: 7:  Independent Dynamic Sitting Balance Dynamic Sitting - Level of Assistance: 7: Independent Static Standing Balance Static Standing - Level of Assistance: 7: Independent Dynamic Standing Balance Dynamic Standing - Level of Assistance: 7: Independent Extremity/Trunk Assessment RUE Assessment RUE Assessment: Within Functional Limits LUE Assessment LUE Assessment: Within Functional Limits   Collene Massimino 10/18/2020, 11:41 AM

## 2020-10-18 NOTE — Progress Notes (Signed)
Patient ID: Natalie [name], female   DOB: 02/05/1956, 65 y.o.   MRN: [id number] Spoke with son-Natalie [name] to schedule family meeting for tomorrow, he reports 1:00 pm works. Made aware pt will be discharged after the meeting to answer questions and address concerns.

## 2020-10-18 NOTE — Discharge Summary (Signed)
Physical Therapy Discharge Summary  Patient Details  Name: ANNSLEY AKKERMAN MRN: [id number] Date of Birth: 19-Apr-1956  Today's Date: 10/18/2020 PT Individual Time: 1100-1156 + [id number] PT Individual Time Calculation (min): 56 min + 23 min  Patient has met 9 of 9 long term goals due to improved activity tolerance, improved balance, improved postural control and improved attention.  Patient to discharge at an ambulatory level Supervision. Patient's care partner is independent to provide the necessary cognitive assistance at discharge.  Reasons goals not met: n/a   Recommendation:  Patient will benefit from ongoing skilled PT services in home health setting to continue to advance safe functional mobility, address ongoing impairments in dynamic balance, home safety, general strengthening, caregiver training in order to minimize fall risk.  Equipment: No equipment provided  Reasons for discharge: treatment goals met and discharge from hospital  Patient/family agrees with progress made and goals achieved: Yes  PT Discharge Precautions/Restrictions Precautions Precautions: Fall Precaution Comments: short term memory, word-finding/anomia Restrictions Weight Bearing Restrictions: No Vision/Perception  Perception Perception: Within Functional Limits Praxis Praxis: Impaired Praxis Impairment Details: Ideation;Ideomotor;Motor planning Praxis-Other Comments: Performs better with functional > novel tasks. Distractability impacting performance  Cognition Overall Cognitive Status: Impaired/Different from baseline Focused Attention: Appears intact Sustained Attention: Appears intact Memory: Impaired Memory Impairment: Decreased recall of new information;Retrieval deficit;Storage deficit Decreased Short Term Memory: Verbal basic;Functional basic Awareness: Impaired Awareness Impairment: Emergent impairment Problem Solving: Impaired Problem Solving Impairment: Verbal basic;Functional  basic Executive Function: Sequencing;Organizing;Decision Making Sequencing: Impaired Sequencing Impairment: Manufacturing systems engineer: Impaired Organizing Impairment: Functional basic Decision Making: Impaired Decision Making Impairment: Functional basic Safety/Judgment: Impaired Comments: impaired for high level cognitive tasks such as cooking or using anything that could cause a burn Sensation Sensation Light Touch: Appears Intact Hot/Cold: Appears Intact Proprioception: Appears Intact Stereognosis: Appears Intact Coordination Gross Motor Movements are Fluid and Coordinated: Yes Fine Motor Movements are Fluid and Coordinated: Yes Motor  Motor Motor: Within Functional Limits Motor - Discharge Observations: No focal neuro deficits. Strength symmetrical. Generalized weakness and deconditioning which has improved since date of evaluation  Mobility Bed Mobility Bed Mobility: Rolling Right;Rolling Left;Supine to Sit;Sit to Supine Rolling Right: Independent Rolling Left: Independent Supine to Sit: Independent Sit to Supine: Independent Transfers Transfers: Sit to Stand;Stand to Sit;Stand Pivot Transfers Sit to Stand: Independent Stand to Sit: Independent Stand Pivot Transfers: Independent Transfer (Assistive device): None Locomotion  Gait Ambulation: Yes Gait Assistance: Supervision/Verbal cueing Gait Distance (Feet): 1000 Feet Assistive device: None Gait Assistance Details: Verbal cues for precautions/safety Gait Gait: Yes Gait Pattern: Within Functional Limits Gait Pattern: Within Functional Limits Gait velocity: Decreased Stairs / Additional Locomotion Stairs: Yes Stairs Assistance: Independent Stair Management Technique: One rail Left Number of Stairs: 18 Height of Stairs: 6 Ramp: Supervision/Verbal cueing Curb: Supervision/Verbal cueing Wheelchair Mobility Wheelchair Mobility: No  Trunk/Postural Assessment  Cervical Assessment Cervical Assessment: Within  Functional Limits Thoracic Assessment Thoracic Assessment: Within Functional Limits Lumbar Assessment Lumbar Assessment: Within Functional Limits Postural Control Postural Control: Within Functional Limits  Balance Balance Balance Assessed: Yes Standardized Balance Assessment Standardized Balance Assessment: Timed Up and Go Test Timed Up and Go Test TUG: Normal TUG Normal TUG (seconds): 10.4 Static Sitting Balance Static Sitting - Level of Assistance: 7: Independent Dynamic Sitting Balance Dynamic Sitting - Level of Assistance: 7: Independent Static Standing Balance Static Standing - Level of Assistance: 7: Independent Dynamic Standing Balance Dynamic Standing - Level of Assistance: 7: Independent Extremity Assessment  RUE Assessment RUE Assessment: Within Functional Limits LUE Assessment LUE  Assessment: Within Functional Limits RLE Assessment RLE Assessment: Within Functional Limits LLE Assessment LLE Assessment: Within Functional Limits  Skilled Intervention:  1st session: Pt greeted seated EOB, agreeable to therapy. No reports of pain. Pt unable to recall me as her primary PT and mistaken me for her SLP therapist. Sit<>stand indep from EOB with no AD. Gait training from her room all the way downstairs (via elevator) to outside, >718f with supervision. While outdoors, practiced gait training on unlevel concrete with supervision and no AD, ambulated >3077foutdoors total. While outside, pt becomes frequently distracted by sounds and people and she herself reports feeling "startled" at times because of these. Continued gait training in the hospital where she navigated up/down 18 + 4 steps with 1 hand rail via self selected reciprocal pattern, supervision for safety. While ambulating in busy hallways, pt noted to become more uncertain and uncomfortable and often would worry about others more than herself. Ambulated back upstairs (via elevator) with supervision and no AD. In total,  she likely ambulated >10056frior to seated rest breaks and pt never c/o fatigue. While upstairs on rehab floor, she completed car transfer indep without AD. She then performed NMR with bits system while standing. She completed the Maze test with minimal cues. She then completed memory test with verbal/visual presentation - able to remember only 2 word recall and was unable to get anything more than that. Ambulated back to her room with supervision and no AD. She ended session seated EOB with needs in reach. Bed alarm on for safety.   2nd session: Pt greeted sleeping in bed, awakens to voice and agreeable to therapy. Pleasant and cooperative throughout. She denies any pain but she requires a few extra minutes to "wake up." Bed mobility completed indep. Sit<>stand indep with no AD and ambulated to main rehab gym with supervision and no AD. Completed 5xSTS scoring 9.5 seconds and normal TUG scoring 10.4 seconds, both scores indicative of decreased falls risk. She also completed standing ball toss with good coordination noted and ability to reach far out from BOS to catch the ball. She then practiced ambulating while holding a weighted tray with empty cups, able to manage this appropriately without LOB. Pt then ambulated back to her room with supervision and performed bed mobility indep. She remained supine in bed with needs in reach and bed alarm on.   Nizhoni Parlow P Geri Hepler PT 10/18/2020, 8:45 AM

## 2020-10-18 NOTE — Progress Notes (Signed)
Inpatient Rehabilitation Care Coordinator Discharge Note  The overall goal for the admission was met for:   Discharge location: Yes-HOME WITH SON WHO HAS MOVED IN TO ASSIST WITH MOM  Length of Stay: Yes-8 DAYS  Discharge activity level: Yes-SUPERVISION LEVEL DUE TO COGNITION  Home/community participation: Yes  Services provided included: MD, RD, PT, OT, SLP, RN, CM, Pharmacy and SW  Financial Services: Medicaid and Private Insurance: [place] offered to/list presented to:YES  Follow-up services arranged: Home Health: [facility]-PT,OT,SP, DME: [facility] and Patient/Family has no preference for HH/DME agencies  Comments (or additional information):PT DID WELL AND REACHED SUPERVISION LEVEL GOALS NEEDS FOR SAFETY AND MED MANAGEMENT  Patient/Family verbalized understanding of follow-up arrangements: Yes  Individual responsible for coordination of the follow-up plan: [name]-SON [phone]  Confirmed correct DME delivered: Elease Hashimoto 10/18/2020    Worthy Boschert, Gardiner Rhyme

## 2020-10-18 NOTE — Discharge Summary (Signed)
Physician Discharge Summary  Patient ID: Natalie [name] MRN: [id number] DOB/AGE: Apr 18, 1956 65 y.o.  Admit date: 10/11/2020 Discharge date: 10/22/2020  Discharge Diagnoses:  Principal Problem:   Acute ischemic left MCA stroke [facility]) Active Problems:   CKD (chronic kidney disease) stage 3, GFR 30-59 ml/min (HCC)   COPD (chronic obstructive pulmonary disease) (HCC)   Encephalopathy   Aphasia due to acute cerebrovascular accident (CVA) (HCC)   Hypoalbuminemia due to protein-calorie malnutrition (HCC)   Chronic pain syndrome   Dysphagia, post-stroke   Frontal lobe and executive function deficit   Discharged Condition: stable   Significant Diagnostic Studies:  Labs:  Basic Metabolic Panel: Recent Labs  Lab 10/12/20 0524 10/15/20 0700  NA 139 139  K 3.7 3.8  CL 101 101  CO2 32 30  GLUCOSE 94 93  BUN 17 21  CREATININE 1.11* 1.04*  CALCIUM 9.6 9.6    CBC: Recent Labs  Lab 10/12/20 0524 10/15/20 0700  WBC 6.8 5.0  NEUTROABS 4.8  --   HGB 12.2 13.2  HCT 36.9 40.4  MCV 94.9 96.2  PLT 295 288    CBG: No results for input(s): GLUCAP in the last 168 hours.  Brief HPI:   Natalie [name] is a 65 y.o. 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  UDS was positive for methadone and benzos.  MRI brain ordered for work-up but was aborted due to generalized seizure.  She was loaded with Keppra and started on antibiotics due to concerns of aspiration PNA. LP done was negative for infection and neurology did not feel that symptoms due to PRES. .  She continued to be encephalopathic therefore was transferred to [facility] for long-term-EEG monitoring.  EEG showed evidence of metabolic encephalopathy and work-up revealed low folic acid and V42 levels.   She was started on supplementation and Keppra was  discontinued per neurology input.  On 04/04, she developed echolalia with past-pointing and MRI brain done revealing 6 cm L-MCA infarct affecting left insula left inferior frontal gyrus, left frontal and temporal areas with cytotoxic edema.  Plavix was added due to ASA allergy and Dr. Erlinda Hong recommended 30-day event monitor to rule out source of embolic stroke.  Patient did have worsening of confusion with perseveration, confabulation, poor awareness of deficits as well as balance deficits with unsteady gait.  CIR was recommended to functional decline.   Hospital Course: Natalie [name] was admitted to rehab 10/11/2020 for inpatient therapies to consist of PT, ST and OT at least three hours five days a week. Past admission physiatrist, therapy team and rehab RN have worked together to provide customized collaborative inpatient rehab. At admission, she continued to exhibit confusion with unsafe behaviors, perseverative confusion and perseveration on going home.  Her   Her blood blood pressures were monitored on TID basis and have been stable. Oxygen was resumed at 1 liter per Eddy and has been weaned off as patient without hypoxia or DOE. Respiratory status has been stable and safety awareness is improving abut continues to fluctuate.  Follow up labs showed mild rise in [facility] which has to 1.04 and recommend routine follow up with her nephrologist after discharge.     Family continued to expressed multiple frequent concerns bout methadone and wanted to get her weaned off this as she has been on it for  20+ years.  She is not expressing any pain therefore was started on slow weekly  taper and is to follow up with ADS for further taper. She continues to have decreased awareness of deficits with confabulation to compensate for deficits. Supervision is recommended due to cognitive issues and safety. She will continue to receive follow up outpatient PT, OT and ST by [facility] after discharge.     Rehab course: During  patient's stay in rehab team conference was held to monitor patient's progress, set goals and discuss barriers to discharge. At admission, patient required min assist with mobility and with ADL tasks. dShe exhibited moderate expressive aphasia with mild to moderate receptive aphasia and mild cognitive impairment. She  has had improvement in activity tolerance, balance, postural control as well as ability to compensate for deficits.  She has made significant gains and is able to complete personal care at modified independent level. She is independent for transfers and requires supervision to ambulate 1000' without AD but cues for safety. She continues to have limitations due to expressive aphasia with anomia but is able to express wants and needs when context is known. Family education/conference was completed regarding all aspects of safety and care  Disposition: Home  Diet: Heart Healthy diet.   Special Instructions: 1. Family to assist with medication management.  2. No driving or strenuous activity till cleared by MD.    Discharge Instructions    Ambulatory referral to Cardiology   Complete by: As directed    Needs 30 day event monitor for stroke work up   Ambulatory referral to Neurology   Complete by: As directed    Follow up with stroke clinic NP (Jessica Vanschaick or Cecille Rubin, if both not available, consider Zachery Dauer, or Ahern) at [facility] in about 4 weeks. Thanks.   Ambulatory referral to Physical Medicine Rehab   Complete by: As directed    3-4 week follow up     Allergies as of 10/19/2020      Reactions   Sulfasalazine Shortness Of Breath   Aspirin    Darvon [propoxyphene]    Iodine    Penicillins    Sulfa Antibiotics    Zithromax [azithromycin]       Medication List    STOP taking these medications   gabapentin 250 MG/5ML solution Commonly known as: NEURONTIN   pantoprazole sodium 40 mg/20 mL Pack Commonly known as: PROTONIX   Resource ThickenUp Clear  Powd     TAKE these medications   atorvastatin 40 MG tablet Commonly known as: LIPITOR Take 1 tablet (40 mg total) by mouth daily.   clopidogrel 75 MG tablet Commonly known as: PLAVIX Take 1 tablet (75 mg total) by mouth daily.   cyanocobalamin 1000 MCG tablet Take 1 tablet (1,000 mcg total) by mouth daily. Notes to patient: OTC---needs to continue this for supplement   folic acid 1 MG tablet Commonly known as: FOLVITE Take 1 tablet (1 mg total) by mouth daily. Notes to patient: OTC--needs to continue this for supplement   methadone 5 MG tablet Commonly known as: Dolophine 20mg  three times per day Friday and Saturday, 15mg  three times per day starting Sunday What changed:   how much to take  how to take this  when to take this  additional instructions   thiamine 100 MG tablet Take 1 tablet (100 mg total) by mouth daily. Notes to patient: Needs to continue this for supplement       Follow-up Information  Henrietta Hoover, MD. Call on 10/22/2020.   Specialty: Family Medicine Why: for post hospital follow up Contact information: [place] [address] [phone]        [place]. Call on 10/22/2020.   Why: for stroke/encephalopathy follow up Contact information: [address]     [place] [id number] [phone]       Erby Pian, MD. Call on 10/22/2020.   Specialty: Specialist Why: for follow up on COPD/pulmonary needs Contact information: [place] [address] [phone]        Jamse Arn, MD Follow up.   Specialty: Physical Medicine and Rehabilitation Why: Office will call you with follow up appointment Contact information: [address] [place] [address] [phone]               Signed: Bary Leriche 10/22/2020, 4:14 PM

## 2020-10-18 NOTE — Progress Notes (Signed)
Occupational Therapy Session Note  Patient Details  Name: Natalie [name] MRN: [id number] Date of Birth: 29-Oct-1955  Today's Date: 10/18/2020 OT Individual Time: 1000-1100 and 1000-1100 OT Individual Time Calculation (min): 60 min and 60 min   Short Term Goals: Week 1:  OT Short Term Goal 1 (Week 1): STGs = LTGs  Skilled Therapeutic Interventions/Progress Updates:    Visit 1: no pain  During this session, pt worked on packing up her laundry, clean clothing, organizing supplies to go home tomorrow. She did not need any physical assist but needed MAX cues with organizing and problem solving. She kept trying to put paper products or hospital supplies in the laundry bag. Had difficulty deciding what she would wear today. Had pt get her supplies set up for a shower at 10 am.  Pt sat at EOB to work on a craft project her sister provided. Bed alarm set and all needs met.   Visit 2: no c/o pain Pt seen this session for ADLS.  She already had everything set up from earlier session to prepare for the shower.  Completed all self care independently without any cues.  Pt then ambulated to gym to work on UE strengthening exercises using a 4 lb dowel bar with overhead and chest presses.  Standing balance holding a 2 lb weighted ball and completing torso twists and reaching to thighs.  She did need max cues initially to follow each exercises then was able to continue. Ambulated back to room to sit on bed until her PT arrived. Alarm set.     Therapy Documentation Precautions:  Precautions Precautions: Fall Precaution Comments: short term memory, word-finding/anomia Restrictions Weight Bearing Restrictions: No   Pain: Pain Assessment Pain Scale: Faces Pain Score: 0-No pain Faces Pain Scale: No hurt ADL: ADL Eating: Independent Grooming: Independent Where Assessed-Grooming: Standing at sink Upper Body Bathing: Setup Where Assessed-Upper Body Bathing: Shower Lower Body Bathing: Setup Where  Assessed-Lower Body Bathing: Shower Upper Body Dressing: Independent Where Assessed-Upper Body Dressing: Chair Lower Body Dressing: Independent Where Assessed-Lower Body Dressing: Chair Toileting: Independent Where Assessed-Toileting: Glass blower/designer: Programmer, applications Method: Magazine features editor: Close supervision Social research officer, government Method: Heritage manager: Civil engineer, contracting with back,Grab bars Vision Baseline Vision/History: Wears glasses Wears Glasses: At all times Patient Visual Report: No change from baseline Vision Assessment?: No apparent visual deficits Perception  Perception: Within Functional Limits Praxis Praxis: Impaired Praxis Impairment Details: Ideation;Ideomotor;Motor planning Praxis-Other Comments: Performs better with functional > novel tasks. Distractability impacting performance   Therapy/Group: Individual Therapy  [place] 10/18/2020, 11:28 AM

## 2020-10-18 NOTE — Progress Notes (Signed)
[place] PHYSICAL MEDICINE & REHABILITATION PROGRESS NOTE  Subjective/Complaints: Had lengthy discussion with son yesterday- plan for d/c home with him Friday after family meeting in the morning. He will let me know time after discussing with other family members. Discussed methadone wean and outpatient f/u with methadone clinic in 1 week.    denies CP, shortness of breath, nausea, vomiting, diarrhea, pain  Objective: Vital Signs: Blood pressure 115/64, pulse 62, temperature 99 F (37.2 C), temperature source Oral, resp. rate 18, weight 48.3 kg, SpO2 91 %. No results found. No results for input(s): WBC, HGB, HCT, PLT in the last 72 hours. No results for input(s): NA, K, CL, CO2, GLUCOSE, BUN, CREATININE, CALCIUM in the last 72 hours.  Intake/Output Summary (Last 24 hours) at 10/18/2020 0834 Last data filed at 10/17/2020 1352 Gross per 24 hour  Intake 295 ml  Output --  Net 295 ml        Physical Exam: BP 115/64 (BP Location: Left Arm)   Pulse 62   Temp 99 F (37.2 C) (Oral)   Resp 18   Wt 48.3 kg   SpO2 91%   BMI 18.86 kg/m  Gen: no distress, normal appearing HEENT: oral mucosa pink and moist, NCAT Cardio: Reg rate Chest: normal effort, normal rate of breathing Abd: soft, non-distended Ext: no edema Psych: Normal mood.  Slightly impulsive. Musc: No edema in extremities.  No tenderness in extremities. Neuro: Alert and oriented x2, not oriented to time , full ROM without pain  Motor: Grossly 5/5 throughout. Ambulating well in her room getting her clothes.   Assessment/Plan: 1. Functional deficits which require 3+ hours per day of interdisciplinary therapy in a comprehensive inpatient rehab setting.  Physiatrist is providing close team supervision and 24 hour management of active medical problems listed below.  Physiatrist and rehab team continue to assess barriers to discharge/monitor patient progress toward functional and medical goals   Care Tool:  Bathing     Body parts bathed by patient: Right arm,Left arm,Chest,Abdomen,Front perineal area,Buttocks,Right upper leg,Left upper leg,Right lower leg,Left lower leg,Face         Bathing assist Assist Level: Supervision/Verbal cueing     Upper Body Dressing/Undressing Upper body dressing   What is the patient wearing?: Pull over shirt,Bra    Upper body assist Assist Level: Set up assist    Lower Body Dressing/Undressing Lower body dressing      What is the patient wearing?: Underwear/pull up,Pants     Lower body assist Assist for lower body dressing: Supervision/Verbal cueing     Toileting Toileting    Toileting assist Assist for toileting: Supervision/Verbal cueing     Transfers Chair/bed transfer  Transfers assist     Chair/bed transfer assist level: Supervision/Verbal cueing     Locomotion Ambulation   Ambulation assist      Assist level: Supervision/Verbal cueing Assistive device: Cane-straight Max distance: 200   Walk 10 feet activity   Assist     Assist level: Supervision/Verbal cueing Assistive device: Cane-straight   Walk 50 feet activity   Assist    Assist level: Supervision/Verbal cueing Assistive device: Cane-straight    Walk 150 feet activity   Assist    Assist level: Supervision/Verbal cueing Assistive device: Cane-straight    Walk 10 feet on uneven surface  activity   Assist     Assist level: Contact Guard/Touching assist Assistive device: Walker-rolling   Wheelchair     Assist Will patient use wheelchair at discharge?: No   Wheelchair activity  did not occur: N/A         Wheelchair 50 feet with 2 turns activity    Assist    Wheelchair 50 feet with 2 turns activity did not occur: N/A       Wheelchair 150 feet activity     Assist  Wheelchair 150 feet activity did not occur: N/A        Medical Problem List and Plan: 1.  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  Impaired mobility: -DVT/anticoagulation:  Pharmaceutical: d/c Lovenox since ambulating >300 feet             -antiplatelet therapy: Continue Plavix 75mg  daily.  3. Chronic pain/Pain Management:   4/13: continue current methadone dose of 20 mg po every 8 hours x 1 week Then 15 mg po q8hr x 1 week (change on Sunday) Then 10 mg po q6hr (4 times daily)  x 1 week Then 10 mg po q8hr (3 times daily) x 1 week Then 10 mg q12hr (2 x daily x 1 week  Then 10 mg daily x 1 week, then stop.  Discussed with son. Patient should f/u with methadone clinic within 1 week of discharge. Please provide copy of this weaning schedule to son to bring to methadone clinic. 4. Mood: LCSW to follow for evaluation and support.             -antipsychotic agents: N/A 5. Neuropsych: This patient is not capable of making decisions on her own behalf. 6. Skin/Wound Care: Routine pressure relief measures.  7. Fluids/Electrolytes/Nutrition: Monitor I/Os. 8. Vitamin B12 deficiency: Continue po supplement.  9. Thiamine deficiency:  Continue po thiamine daily.  10. CKD stage III: SCr 1.2 at baseline.   Creatinine 1.11 on 4/8, down to 1.04 on 4/11 11. COPD: Was using oxygen 3L per [place] in addition to nebs (Per family/record review)  Will resume at 1 liter and titrate as needed.  Nebs prn.   Weaned 12. Encephalopathy: d/c enclosure bed. Repeat UA/UC. D/c gabapentin since patient denies pain and this could contribute to confusion 13. Aphasia/confusion/anomia- as per #12. Will benefit from outpatient SLP 14.  Hypoalbuminemia  Supplement initiated on 4/8. Diet order edited to request high protein foods.   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 Disposition: ready for d/c. Discussed with son. Plan for d/c Friday after family meeting. Discussed with patient and team.    LOS: 7 days A FACE TO FACE EVALUATION WAS [place] Auriana Scalia 10/18/2020, 8:34 AM

## 2020-10-19 DIAGNOSIS — R41844 Frontal lobe and executive function deficit: Secondary | ICD-10-CM

## 2020-10-19 LAB — URINALYSIS, ROUTINE W REFLEX MICROSCOPIC
Bilirubin Urine: NEGATIVE
Glucose, UA: NEGATIVE mg/dL
Hgb urine dipstick: NEGATIVE
Ketones, ur: NEGATIVE mg/dL
Nitrite: NEGATIVE
Protein, ur: NEGATIVE mg/dL
Specific Gravity, Urine: 1.016 (ref 1.005–1.030)
pH: 7 (ref 5.0–8.0)

## 2020-10-19 MED ORDER — METHADONE HCL 5 MG PO TABS: ORAL_TABLET | ORAL | 0 refills | Status: DC

## 2020-10-19 MED ORDER — CYANOCOBALAMIN 1000 MCG PO TABS: 1000.0000 ug | ORAL_TABLET | Freq: Every day | ORAL | 0 refills | Status: DC

## 2020-10-19 MED ORDER — ATORVASTATIN CALCIUM 40 MG PO TABS: 40.0000 mg | ORAL_TABLET | Freq: Every day | ORAL | 0 refills | Status: DC

## 2020-10-19 MED ORDER — CLOPIDOGREL BISULFATE 75 MG PO TABS: 75.0000 mg | ORAL_TABLET | Freq: Every day | ORAL | 0 refills | Status: AC

## 2020-10-19 MED ORDER — THIAMINE HCL 100 MG PO TABS: 100.0000 mg | ORAL_TABLET | Freq: Every day | ORAL | 0 refills | Status: DC

## 2020-10-19 MED ORDER — FOLIC ACID 1 MG PO TABS: 1.0000 mg | ORAL_TABLET | Freq: Every day | ORAL | 1 refills | Status: DC

## 2020-10-19 NOTE — Discharge Instructions (Signed)
Inpatient Rehab Discharge Instructions  Natalie [name] Discharge date and time: 10/19/20   Activities/Precautions/ Functional Status: Activity: no lifting, driving, or strenuous exercise for till cleared by MD Diet: Soft, chopped foods. Wound Care: none needed    Functional status:  ___ No restrictions     ___ Walk up steps independently _X__ 24/7 supervision/assistance   ___ Walk up steps with assistance ___ Intermittent supervision/assistance  ___ Bathe/dress independently ___ Walk with walker     _X__ Bathe/dress with assistance to gather items ___ Walk Independently    ___ Shower independently _X__ Walk with supervision     _X__ Shower with supervision to get in/out of shower ___ No alcohol     ___ Return to work/school ________   Special Instructions: 1. Recommendations for methadone taper: Continue current methadone dose of 20 mg every 8 hours thorough Saturday. Then 15 mg every 8hr x 1 week (change on Sunday) Then 10 mg every 6hr (4 times daily) x 1 week Then 10 mg every 8hr (3 times daily) x 1 week Then 10 mg every 12hr (2 x daily x 1 week  Then 10 mg daily x 1 week, then stop  2. Family will have to handle medications and call MD offices/methadone clinic on Monday 04/18  to set follow up appointments.  3. We will follow for rehab needs--ALL REFILLS TO BE ADDRESSED BY PRIMARY CARE OR METHADONE CLINIC.     COMMUNITY REFERRALS UPON DISCHARGE:    Home Health:   PT, OT, [place] Phone:[phone]   Medical Equipment/Items Ordered: TUB SEAT                                                 Agency/Supplier: [facility]  [phone]  My questions have been answered and I understand these instructions. I will adhere to these goals and the provided educational materials after my discharge from the hospital.  Patient/Caregiver Signature _______________________________ Date __________  Clinician Signature  _______________________________________ Date __________  Please bring this form and your medication list with you to all your follow-up doctor's appointments.

## 2020-10-19 NOTE — Progress Notes (Signed)
Natalie [name] PHYSICAL MEDICINE & REHABILITATION PROGRESS NOTE  Subjective/Complaints: No complaints this morning Family meeting scheduled at 1pm today, then d/c home with son afterward BP and HR soft, other vitals stable.    denies CP, shortness of breath, nausea, vomiting, diarrhea, pain  Objective: Vital Signs: Blood pressure (!) 112/58, pulse (!) 59, temperature 98 F (36.7 C), resp. rate 18, weight 48.3 kg, SpO2 98 %. No results found. No results for input(s): WBC, HGB, HCT, PLT in the last 72 hours. No results for input(s): NA, K, CL, CO2, GLUCOSE, BUN, CREATININE, CALCIUM in the last 72 hours.  Intake/Output Summary (Last 24 hours) at 10/19/2020 1029 Last data filed at 10/18/2020 1823 Gross per 24 hour  Intake 354 ml  Output --  Net 354 ml        Physical Exam: BP (!) 112/58 (BP Location: Left Arm)   Pulse (!) 59   Temp 98 F (36.7 C)   Resp 18   Wt 48.3 kg   SpO2 98%   BMI 18.86 kg/m  Gen: no distress, normal appearing HEENT: oral mucosa pink and moist, NCAT Cardio: Reg rate Chest: normal effort, normal rate of breathing Abd: soft, non-distended Ext: no edema Psych: Normal mood.  Slightly impulsive. Musc: No edema in extremities.  No tenderness in extremities. Neuro: Alert and oriented x2, not oriented to time , full ROM without pain  Motor: Grossly 5/5 throughout. Ambulating well in her room getting her clothes.   Assessment/Plan: 1. Functional deficits which require 3+ hours per day of interdisciplinary therapy in a comprehensive inpatient rehab setting.  Physiatrist is providing close team supervision and 24 hour management of active medical problems listed below.  Physiatrist and rehab team continue to assess barriers to discharge/monitor patient progress toward functional and medical goals   Care Tool:  Bathing    Body parts bathed by patient: Right arm,Left arm,Chest,Abdomen,Front perineal area,Buttocks,Right upper leg,Left upper leg,Right lower  leg,Left lower leg,Face         Bathing assist Assist Level: Set up assist     Upper Body Dressing/Undressing Upper body dressing   What is the patient wearing?: Pull over shirt,Bra    Upper body assist Assist Level: Independent    Lower Body Dressing/Undressing Lower body dressing      What is the patient wearing?: Underwear/pull up,Pants     Lower body assist Assist for lower body dressing: Independent     Toileting Toileting    Toileting assist Assist for toileting: Independent     Transfers Chair/bed transfer  Transfers assist     Chair/bed transfer assist level: Independent     Locomotion Ambulation   Ambulation assist      Assist level: Supervision/Verbal cueing Assistive device: No Device Max distance: >1028ft   Walk 10 feet activity   Assist     Assist level: Supervision/Verbal cueing Assistive device: No Device   Walk 50 feet activity   Assist    Assist level: Supervision/Verbal cueing Assistive device: No Device    Walk 150 feet activity   Assist    Assist level: Supervision/Verbal cueing Assistive device: No Device    Walk 10 feet on uneven surface  activity   Assist     Assist level: Supervision/Verbal cueing Assistive device: Walker-rolling   Wheelchair     Assist Will patient use wheelchair at discharge?: No   Wheelchair activity did not occur: N/A         Wheelchair 50 feet with 2 turns activity  Assist    Wheelchair 50 feet with 2 turns activity did not occur: N/A       Wheelchair 150 feet activity     Assist  Wheelchair 150 feet activity did not occur: N/A        Medical Problem List and Plan: 1.  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  Impaired mobility: -DVT/anticoagulation:  Pharmaceutical: d/c Lovenox since ambulating >300 feet             -antiplatelet therapy: Continue Plavix  75mg  daily.  3. Chronic pain/Pain Management:   4/13: continue current methadone dose of 20 mg po every 8 hours x 1 week Then 15 mg po q8hr x 1 week (change on Sunday) Then 10 mg po q6hr (4 times daily)  x 1 week Then 10 mg po q8hr (3 times daily) x 1 week Then 10 mg q12hr (2 x daily x 1 week  Then 10 mg daily x 1 week, then stop.  Discussed with son. Patient should f/u with methadone clinic within 1 week of discharge. Please provide copy of this weaning schedule to son to bring to methadone clinic. Will encouraged son to make this appointment as soon as possible following discharge. We can provide 1 week supply.  4. Mood: LCSW to follow for evaluation and support.             -antipsychotic agents: N/A 5. Neuropsych: This patient is not capable of making decisions on her own behalf. 6. Skin/Wound Care: Routine pressure relief measures.  7. Fluids/Electrolytes/Nutrition: Monitor I/Os. 8. Vitamin B12 deficiency: Continue po supplement.  9. Thiamine deficiency:  Continue po thiamine daily.  10. CKD stage III: SCr 1.2 at baseline.   Creatinine 1.11 on 4/8, down to 1.04 on 4/11 11. COPD: Was using oxygen 3L per [place] in addition to nebs (Per family/record review)   Weaned off O2- will require pulmonology follow-up, will discuss with son.  12. Encephalopathy: d/c enclosure bed. Repeat UA/UC. Messaged nurse to see if this can be drawn today.  D/c gabapentin since patient denies pain and this could contribute to confusion 13. Aphasia/confusion/anomia- as per #12. Will benefit from outpatient SLP 14.  Hypoalbuminemia  Supplement initiated on 4/8. Diet order edited to request high protein foods.   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 Disposition: ready for d/c. Discussed with son. Plan for d/c Friday after family meeting. Discussed with patient and team.    LOS: 8 days A FACE TO FACE EVALUATION WAS [place] 10/19/2020, 10:29 AM

## 2020-10-19 NOTE — Progress Notes (Signed)
Speech Language Pathology Discharge Summary  Patient Details  Name: Natalie [name] MRN: [id number] Date of Birth: [birth year]  Today's Date: 10/19/2020 SLP Individual Time: 1300-1345 SLP Individual Time Calculation (min): 45 min   Skilled Therapeutic Interventions:  Patient seen with family members present, social work and CIR MD for meeting to discuss her progress, answer any questions family has prior to discharge this afternoon. Patient's son and other family members had very appropriate questions regarding patient's safety at home, anticipated progress with therapy, etc. SLP, social work and MD answered all questions to family's and patient's satisfaction.     Patient has met 6 of 6 long term goals.  Patient to discharge at overall Supervision;Min level.  Reasons goals not met: N/A   Clinical Impression/Discharge Summary: Patient made very good progress and met all 6 LTG's and discharged home with family at supervision to [facility] level overall. Patient has demonstrated significant improvement in cognitive function, specifically awareness and memory. She continues to exhibit expressive aphasia with anomia however she is able to communicate wants/needs/thoughts at phrase level without significant difficulty when context is known. Patient is to have 24 hour supervision from family as well as [place], OT, PT and all further ST needs can be addressed at next venue of care.  Care Partner:  Caregiver Able to Provide Assistance: Yes  Type of Caregiver Assistance: Physical;Cognitive  Recommendation:  Home Health SLP;24 hour supervision/assistance  Rationale for SLP Follow Up: Maximize functional communication;Maximize cognitive function and independence   Equipment: N/A for speech   Reasons for discharge: Discharged from hospital   Patient/Family Agrees with Progress Made and Goals Achieved: Yes    Sonia Baller, MA, CCC-SLP Speech Therapy

## 2020-10-19 NOTE — Consult Note (Signed)
Neuropsychological Consultation   Patient:   Natalie [name]   DOB:   1956/06/22  MR Number:  [id number]  Location:  [place] [address] [place] [id number] [place] [address] Dept: [place]: [phone]           Date of Service:   10/19/2020  Start Time:   8:30 AM End Time:   9:30 AM  Provider/Observer:  Ilean Skill, Psy.D.       Clinical Neuropsychologist       Billing Code/Service: [phone]  Chief Complaint:    Natalie [name] is a 65 year old female with a history of COPD and oxygen dependent, hepatitis C, remote seizure, chronic kidney disease, frequent falls, chronic pain on methadone, major depression with recent loss of partner after 20-year relationship as well as a PET that she was very close to.  Patient was admitted to [facility] on 09/28/2020 after a neighbor found her walking the streets confused and covered in stool.  She was brought to the emergency department by another neighbor.  Urine drug screen in the ED was positive for methadone and benzos.  Initial CT of head and MRI were ordered and the CT was negative for acute process.  MRI was aborted due to generalized seizure activity and nonresponsive to Ativan.  Patient was started on propofol.  Patient had tachyphemia and tachycardia and was intubated emergently.  She was loaded on Keppra and antibiotics due to concern over aspiration pneumonia.  There were also concerns of PRES but MRI of brain was negative for acute changes.  LP was done to rule out aseptic/viral meningitis and Acycovir was added.  Patient continued to be encephalopathic and was transferred to [facility] for LT-EEG monitoring.  EEG was negative for seizures but showed metabolic encephalopathy and was found to have low folic HBZJ/I96 levels.  HSV PCR was negative and Acyclovir was discontinued.  Patient tolerated extubation on 3/30 and respiratory status was  stable.  Keppra was discontinued.  On 4/4 the patient developed echolalia and other neurological changes and repeat MRI brain done revealing 6 cm left MCA territory infarct affecting left insula, left inferior frontal gyrus, left temporal and frontal operculum as well as cytotoxic edema with mild petechial hemorrhage.  Stroke work-up was initiated but at that point the source of the embolic stroke was not clearly identified.  Plavix added due to aspirin allergy.  Patient has had worsening of confusion with perseveration, confabulation, poor awareness of deficits as well as balance deficits with unsteady gait.  Reason for Service:  Patient was referred for neuropsychological consultation due to ongoing cognitive deficits particularly executive functioning deficits.  Expressive language has improved but the patient tends to perseverate and continue with confusion and deficits in memory recall.  Below is the HPI for the current admission.  HPI: Natalie [name] is a 65 year old female with history of COPD- oxygen dependent, Hep C, remote seizure, CKD-III (Dr.Kolluru), frequent falls, chronic pain-on methadone, major depression (recent loss of partner and pet) who was admitted to [facility] with 09/28/20 after a neighbor found her confused with difficulty talking and covered in stool.  UDS positive for methadone and benzo's. CT head negative and MRI ordered but aborted due to generalized seizure activity not responsive to ativan and started on propofol. She had tachypnea and tachycardia and was intubated emergently. She was loaded with Keppra and antibiotics due to concerns of aspiration PNA.  Dr.Kirkpatrick  expressed concerns of PRES but MRI brain negative for acute changes.  LP done to rule out aseptic/viral meningitis and acycovir added. She continued to be encephalopathic and was transferred to [facility] for LT-EEG monitoring.  EEG negative for seizures and showed metabolic encephalopathy and was found to have low folic  JQBH/A19 levels. HSV PCR negative and acyclovir d/c.  She tolerated extubation 03/30 and respiratory status stable. Keppra d/c per neurology input. 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  Stroke work up initiated--LE dopplers negative and 30 day event monitor recommended by Dr. Erlinda Hong to rule out source of embolic stroke. Plavix added due to ASA allergy. She has had worsening of confusion with perseveration, confabulation, poor awareness of deficits as well as balance deficits with unsteady gait. CIR recommended due to functional decline.   Current Status:  The patient was awake and alert sitting up in her bed with legs crossed and arms crossed over her legs.  She was oriented to place but very limited ability to describe what exactly happened to her and what the current issues or deficits were.  Patient was able to report back what her family has told her regarding the events around her initial hospitalization and she was aware of some of the early aspects of her hospitalization.  Patient with significant confusion and executive functioning deficits.  Perseverative responses to even simple questions like what she had for breakfast.  While expressive language and articulation were fine there were clear perseverations.  Memory deficits were noted particularly for retrieval of information.  Patient stated that year was initially 1990 something and then when asked to try a second time she stated it was 2099.  When I did give her the proper year she reported "that is right."  Patient with ongoing executive and processing information and significant confusion.  At a later point she looked down to the side of her bed and asked if the oven was still open and was not able to identify that this was clearly an  incorrect perception/belief/expectation.  Patient with significant reduced awareness with confusion and perseverative responses.  While she has made significant improvements she confabulates to fill in areas where she is confused or unable to accurately recall.  There are clearly significant left frontal deficits that are residual.  She shows no right motor deficits.  Is unclear how much of her memory deficits are due to temporal lobe involvement but much of her memory impairments are clearly related to retrieval deficits rather than an inability to store and organize information.  Confabulatory responses and perseverative responses remain.  Behavioral Observation: Natalie [name]  presents as a 65 y.o.-year-old Right Caucasian Female who appeared her stated age. her dress was Appropriate and she was Well Groomed and her manners were Appropriate to the situation.  her participation was indicative of Appropriate and Inattentive behaviors.  There were not physical disabilities noted.  she displayed an appropriate level of cooperation and motivation.     Interactions:    Active Inattentive and Redirectable  Attention:   abnormal and attention span appeared shorter than expected for age  Memory:   abnormal; remote memory intact, recent memory impaired  Visuo-spatial:  not examined  Speech (Volume):  normal  Speech:   normal; perseverative speech but no more echolalia.  Thought Process:  Tangential and Disorganized  Though Content:  Rumination; not suicidal and not homicidal  Orientation:   person and place  Judgment:   Poor  Planning:   Poor  Affect:    Excited  Mood:    Euthymic  Insight:   Shallow  Intelligence:   normal  Substance Use:  Patient with longstanding chronic pain disorder and and had continued to take opiates and benzodiazepines and was positive urine drug screen for those at admission.  She is continuing with her home medicine while in the hospital.  Medical  History:   Past Medical History:  Diagnosis Date  . Cataracts, bilateral   . Chickenpox cataracts  . COPD (chronic obstructive pulmonary disease) ([place])   . Depression   . Drug addiction ([place])   . Headache   . Hepatitis C virus infection   . Hepatitis C without hepatic coma   . History of chicken pox   . History of colon polyps   . Seizures [facility])          Patient Active Problem List   Diagnosis Date Noted  . Frontal lobe and executive function deficit   . Hypoalbuminemia due to protein-calorie malnutrition ([place])   . Chronic pain syndrome   . Dysphagia, post-stroke   . Acute ischemic left MCA stroke ([place]) 10/11/2020  . 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 Cerebral thrombosis with cerebral infarction 10/10/2020  . Malnutrition of moderate degree 10/01/2020  . Pressure injury of skin 09/30/2020  . Encephalopathy acute 09/30/2020  . Altered mental status   . Respiratory failure ([place])   . Encephalopathy 09/28/2020  . Status epilepticus ([place])   . NSTEMI (non-ST elevated myocardial infarction) ([place]) 07/24/2017  . Cataracts, bilateral 03/30/2017  . COPD (chronic obstructive pulmonary disease) ([place]) 03/30/2017  . Depression 03/30/2017  . Drug addiction ([place]) 03/30/2017  . Seizures ([place]) 03/30/2017  . Chronic migraine without aura without status migrainosus, not intractable 07/26/2014  . 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 CKD (chronic kidney disease) stage 3, GFR 30-59 ml/min ([facility]) 08/04/2013  . Edema 03/24/2011  . Tobacco use disorder 03/24/2011  . 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 Migraine headache 01/10/2011   Psychiatric History:  Patient has a past medical history that includes depressive disorder with entry being placed in her medical records around depressions in 2018.  Patient currently denies any significant depression and emotion/affect today was euthymic   Family Med/Psych History:  Family  History  Problem Relation Age of Onset  . Breast cancer Paternal Grandmother    Impression/DX:  Natalie [name] is a 65 year old female with a history of COPD and oxygen dependent, hepatitis C, remote seizure, chronic kidney disease, frequent falls, chronic pain on methadone, major depression with recent loss of partner after 20-year relationship as well as a PET that she was very close to.  Patient was admitted to [facility] on 09/28/2020 after a neighbor found her walking the streets confused and covered in stool.  She was brought to the emergency department by another neighbor.  Urine drug screen in the ED was positive for methadone and benzos.  Initial CT of head and MRI were ordered and the CT was negative for acute process.  MRI was aborted due to generalized seizure activity and nonresponsive to Ativan.  Patient was started on propofol.  Patient had tachyphemia and tachycardia and was intubated emergently.  She was loaded on Keppra and antibiotics due to concern over  aspiration pneumonia.  There were also concerns of PRES but MRI of brain was negative for acute changes.  LP was done to rule out aseptic/viral meningitis and Acycovir was added.  Patient continued to be encephalopathic and was transferred to [facility] for LT-EEG monitoring.  EEG was negative for seizures but showed metabolic encephalopathy and was found to have low folic VFIE/P32 levels.  HSV PCR was negative and Acyclovir was discontinued.  Patient tolerated extubation on 3/30 and respiratory status was stable.  Keppra was discontinued.  On 4/4 the patient developed echolalia and other neurological changes and repeat MRI brain done revealing 6 cm left MCA territory infarct affecting left insula, left inferior frontal gyrus, left temporal and frontal operculum as well as cytotoxic edema with mild petechial hemorrhage.  Stroke work-up was initiated but at that point the source of the embolic stroke was not clearly  identified.  Plavix added due to aspirin allergy.  Patient has had worsening of confusion with perseveration, confabulation, poor awareness of deficits as well as balance deficits with unsteady gait.  The patient was awake and alert sitting up in her bed with legs crossed and arms crossed over her legs.  She was oriented to place but very limited ability to describe what exactly happened to her and what the current issues or deficits were.  Patient was able to report back what her family has told her regarding the events around her initial hospitalization and she was aware of some of the early aspects of her hospitalization.  Patient with significant confusion and executive functioning deficits.  Perseverative responses to even simple questions like what she had for breakfast.  While expressive language and articulation were fine there were clear perseverations.  Memory deficits were noted particularly for retrieval of information.  Patient stated that year was initially 1990 something and then when asked to try a second time she stated it was 2099.  When I did give her the proper year she reported "that is right."  Patient with ongoing executive and processing information and significant confusion.  At a later point she looked down to the side of her bed and asked if the oven was still open and was not able to identify that this was clearly an incorrect perception/belief/expectation.  Patient with significant reduced awareness with confusion and perseverative responses.  While she has made significant improvements she confabulates to fill in areas where she is confused or unable to accurately recall.  There are clearly significant left frontal deficits that are residual.  She shows no right motor deficits.  Is unclear how much of her memory deficits are due to temporal lobe involvement but much of her memory impairments are clearly related to retrieval deficits rather than an inability to store and organize  information.  Confabulatory responses and perseverative responses remain.   Diagnosis:    Acute ischemic left MCA stroke ([place]) - Plan: Ambulatory referral to Neurology   Left Frontal Lobe/Executive Functioning Deficits.      Electronically Signed   _______________________ Ilean Skill, Psy.D. Clinical Neuropsychologist

## 2020-10-19 NOTE — Progress Notes (Signed)
Patient ID: Natalie [name], female   DOB: Dec 08, 1955, 65 y.o.   MRN: [id number]    Patient/Family Conference 10/19/2020 @ 1:00 pm  Patient/family in attendance:Clayton-son, [name]-son and John-separated husband  Staff in attendance:MD, Becky-Sw and John-SP  Main focus:Answering questions addressing concerns regarding pt  Synopsis of information shared:Discussed pt's progress and need for 24/7 supervision level. Answered questions regarding confusion, need for O2 and methadone weaning. Made aware will need assistance with cooking and medication management due to safety issues with both as a result of CVA.  Pt doing better than family members thought she was and feels less anxious regarding discharge today.  Barriers/concerns expressed by patient and family: questions answered and all feel comfortable with discharge today and pt's care recommendations.  Patient/family response:All very positive and pleased with pt's progress while here. Made even more progress since had been here on Tuesday visiting. Gave community resources to follow up with Advanced Directives, private duty list and memory care facilities if needed.  Follow-up/action plans:Pt to be discharged today. Goals met-distant supervision and matthew-son to provide supervision level at home. Made aware to have pt sitting when bring in two dogs at home. Pt no longer requires O2 at home, has not used here. Issues resolved and questions answered.

## 2020-10-20 LAB — URINE CULTURE: Culture: <10000 — AB

## 2020-10-23 ENCOUNTER — Encounter: Payer: Self-pay | Admitting: *Deleted

## 2020-10-23 NOTE — Progress Notes (Signed)
Patient ID: Natalie [name], female   DOB: 05-31-1956, 65 y.o.   MRN: [id number] Pt enrolled for [employer] to ship a 30 day cardiac event monitor to her home.  Letter with instructions have been mailed to the pt.

## 2020-10-25 ENCOUNTER — Telehealth: Payer: Self-pay | Admitting: *Deleted

## 2020-10-25 NOTE — Telephone Encounter (Signed)
Tiffany from [facility] left a message asking for verbal orders for HHPT 1wk1,2wk4, 1wk4 and HHaide 2wk2, 1wk2.  Medical record reviewed. Social work note reviewed.  Verbal orders given per office protocol.

## 2020-10-26 ENCOUNTER — Ambulatory Visit (INDEPENDENT_AMBULATORY_CARE_PROVIDER_SITE_OTHER): Payer: BC Managed Care – PPO | Admitting: Cardiology

## 2020-10-26 ENCOUNTER — Other Ambulatory Visit: Payer: Self-pay

## 2020-10-26 ENCOUNTER — Encounter: Payer: Self-pay | Admitting: Cardiology

## 2020-10-26 ENCOUNTER — Ambulatory Visit (INDEPENDENT_AMBULATORY_CARE_PROVIDER_SITE_OTHER): Payer: BC Managed Care – PPO

## 2020-10-26 VITALS — BP 88/64 | HR 54 | Ht 63.0 in | Wt 105.0 lb

## 2020-10-26 DIAGNOSIS — E7800 Pure hypercholesterolemia, unspecified: Secondary | ICD-10-CM | POA: Diagnosis not present

## 2020-10-26 DIAGNOSIS — I6330 Cerebral infarction due to thrombosis of unspecified cerebral artery: Secondary | ICD-10-CM

## 2020-10-26 DIAGNOSIS — I63512 Cerebral infarction due to unspecified occlusion or stenosis of left middle cerebral artery: Secondary | ICD-10-CM

## 2020-10-26 NOTE — Progress Notes (Signed)
Cardiology Office Note:    Date:  10/26/2020   ID:  Natalie [name], DOB 04/04/1956, MRN [id number]  PCP:  Henrietta Hoover, MD   [place]  Cardiologist:  Kate Sable, MD  Advanced Practice Provider:  No care team member to display Electrophysiologist:  None       Referring MD: Bary Leriche, PA-C   Chief Complaint  Patient presents with  . Hospitalization Follow-up    S/p Stroke   Natalie [name] is a 65 y.o. female who is being seen today for the evaluation of cardioembolic source for CVA at the request of Love, Ivan Anchors, PA-C.   History of Present Illness:    Natalie [name] is a 65 y.o. female with a hx of COPD, hep C, former smoker x40+ years, chronic pain on methadone who presents due to CVA.  Patient was admitted to the hospital 10/12/2019 with altered mental status.  Work-up via MRI brain revealed a left MCA infarct.  Patient started on Plavix due to aspirin allergy.  Cardiac monitor recommended by neurology.  Echocardiogram performed 10/10/2020 showed normal systolic function, EF 55 to 60%, with no findings to suggest cardioembolic cause.  Ultrasound lower extremity venous 10/10/2020 showed no evidence for DVT.  Past Medical History:  Diagnosis Date  . Cataracts, bilateral   . Chickenpox cataracts  . COPD (chronic obstructive pulmonary disease) ([place])   . Depression   . Drug addiction ([place])   . Headache   . Hepatitis C virus infection   . Hepatitis C without hepatic coma   . History of chicken pox   . History of colon polyps   . Seizures ([place])     Past Surgical History:  Procedure Laterality Date  . APPENDECTOMY    . CHOLECYSTECTOMY    . COLONOSCOPY WITH PROPOFOL N/A 04/30/2015   Procedure: COLONOSCOPY WITH PROPOFOL;  Surgeon: Josefine Class, MD;  Location: [facility] ENDOSCOPY;  Service: Endoscopy;  Laterality: N/A;  . COLONOSCOPY WITH PROPOFOL N/A 07/13/2017   Procedure: COLONOSCOPY WITH PROPOFOL;  Surgeon: Manya Silvas, MD;   Location: [facility] ENDOSCOPY;  Service: Endoscopy;  Laterality: N/A;  . ESOPHAGOGASTRODUODENOSCOPY (EGD) WITH PROPOFOL N/A 07/13/2017   Procedure: ESOPHAGOGASTRODUODENOSCOPY (EGD) WITH PROPOFOL;  Surgeon: Manya Silvas, MD;  Location: [facility] ENDOSCOPY;  Service: Endoscopy;  Laterality: N/A;  . EYE SURGERY      Current Medications: Current Meds  Medication Sig  . atorvastatin (LIPITOR) 40 MG tablet Take 1 tablet (40 mg total) by mouth daily.  . clopidogrel (PLAVIX) 75 MG tablet Take 1 tablet (75 mg total) by mouth daily.  . cyanocobalamin 1000 MCG tablet Take 1 tablet (1,000 mcg total) by mouth daily.  . folic acid (FOLVITE) 1 MG tablet Take 1 tablet (1 mg total) by mouth daily.  . methadone (DOLOPHINE) 5 MG tablet 20mg  three times per day Friday and Saturday, 15mg  three times per day starting Sunday  . thiamine 100 MG tablet Take 1 tablet (100 mg total) by mouth daily.     Allergies:   Sulfasalazine, Aspirin, Darvon [propoxyphene], Iodine, Penicillins, Sulfa antibiotics, and Zithromax [azithromycin]   Social History   Socioeconomic History  . Marital status: Married    Spouse name: Not on file  . Number of children: Not on file  . Years of education: Not on file  . Highest education level: Not on file  Occupational History  . Not on file  Tobacco Use  . Smoking status: Former Smoker  Packs/day: 0.50    Years: 41.00    Pack years: 20.50    Types: Cigarettes    Quit date: 10/27/2015    Years since quitting: 5.0  . Smokeless tobacco: Never Used  Vaping Use  . Vaping Use: Never used  Substance and Sexual Activity  . Alcohol use: No  . Drug use: No  . Sexual activity: Not Currently  Other Topics Concern  . Not on file  Social History Narrative   siginificant other recently passed away   Social Determinants of Health   Financial Resource Strain: Not on file  Food Insecurity: Not on file  Transportation Needs: Not on file  Physical Activity: Not on file  Stress: Not on  file  Social Connections: Not on file     Family History: The patient's family history includes Breast cancer in her paternal grandmother.  ROS:   Please see the history of present illness.     All other systems reviewed and are negative.  EKGs/Labs/Other Studies Reviewed:    The following studies were reviewed today:   EKG:  EKG is  ordered today.  The ekg ordered today demonstrates sinus bradycardia, otherwise normal ECG  Recent Labs: 10/02/2020: Magnesium 1.9; TSH 7.203 10/12/2020: ALT 16 10/15/2020: BUN 21; Creatinine, Ser 1.04; Hemoglobin 13.2; Platelets 288; Potassium 3.8; Sodium 139  Recent Lipid Panel    Component Value Date/Time   CHOL 207 (H) 10/10/2020 0406   TRIG 69 10/10/2020 0406   TRIG 95 05/05/2012 0351   HDL 57 10/10/2020 0406   CHOLHDL 3.6 10/10/2020 0406   VLDL 14 10/10/2020 0406   LDLCALC 136 (H) 10/10/2020 0406     Risk Assessment/Calculations:      Physical Exam:    VS:  BP (!) 88/64   Pulse (!) 54   Ht 5\' 3"  (1.6 m)   Wt 105 lb (47.6 kg)   BMI 18.60 kg/m     Wt Readings from Last 3 Encounters:  10/26/20 105 lb (47.6 kg)  10/11/20 106 lb 7.7 oz (48.3 kg)  10/10/20 114 lb 10.2 oz (52 kg)     GEN:  Well nourished, well developed in no acute distress HEENT: Normal NECK: No JVD; No carotid bruits LYMPHATICS: No lymphadenopathy CARDIAC: Bradycardia, no murmurs RESPIRATORY:  Clear to auscultation without rales, wheezing or rhonchi  ABDOMEN: Soft, non-tender, non-distended MUSCULOSKELETAL:  No edema; No deformity  SKIN: Warm and dry NEUROLOGIC:  Alert and oriented x 3 PSYCHIATRIC:  Normal affect   ASSESSMENT:    1. Cerebral thrombosis with cerebral infarction   2. Pure hypercholesterolemia    PLAN:    In order of problems listed above:  1. Patient with recent CVA, echocardiogram with normal ejection fraction, no findings to suggest cardiac embolism.  Place cardiac monitor to evaluate any significant arrhythmia such as A. fib or  flutter.  Obtain carotid ultrasound bilaterally.  Continue Plavix, Lipitor. 2. Hyperlipidemia, continue Lipitor.  Follow-up after carotid ultrasound and cardiac monitor.     Medication Adjustments/Labs and Tests Ordered: Current medicines are reviewed at length with the patient today.  Concerns regarding medicines are outlined above.  Orders Placed This Encounter  Procedures  . US Carotid Duplex Bilateral  . LONG TERM MONITOR (3-14 DAYS)  . EKG 12-Lead   No orders of the defined types were placed in this encounter.   Patient Instructions  Medication Instructions:  Your physician recommends that you continue on your current medications as directed. Please refer to the Current Medication list given  to you today.  *If you need a refill on your cardiac medications before your next appointment, please call your pharmacy*   Lab Work: None ordered If you have labs (blood work) drawn today and your tests are completely normal, you will receive your results only by: Marland Kitchen MyChart Message (if you have MyChart) OR . A paper copy in the mail If you have any lab test that is abnormal or we need to change your treatment, we will call you to review the results.   Testing/Procedures:  1. Your physician has requested that you have a carotid duplex. This test is an ultrasound of the carotid arteries in your neck. It looks at blood flow through these arteries that supply the brain with blood. Allow one hour for this exam. There are no restrictions or special instructions.   2.  Your physician has recommended that you wear a Zio monitor for 2 weeks.   This monitor is a medical device that records the heart's electrical activity. Doctors most often use these monitors to diagnose arrhythmias. Arrhythmias are problems with the speed or rhythm of the heartbeat. The monitor is a small device applied to your chest. You can wear one while you do your normal daily activities. While wearing this monitor if  you have any symptoms to push the button and record what you felt. Once you have worn this monitor for the period of time provider prescribed (Usually 14 days), you will return the monitor device in the postage paid box. Once it is returned they will download the data collected and provide [place] with a report which the provider will then review and we will call you with those results. Important tips:  1. Avoid showering during the first 24 hours of wearing the monitor. 2. Avoid excessive sweating to help maximize wear time. 3. Do not submerge the device, no hot tubs, and no swimming pools. 4. Keep any lotions or oils away from the patch. 5. After 24 hours you may shower with the patch on. Take brief showers with your back facing the shower head.  6. Do not remove patch once it has been placed because that will interrupt data and decrease adhesive wear time. 7. Push the button when you have any symptoms and write down what you were feeling. 8. Once you have completed wearing your monitor, remove and place into box which has postage paid and place in your outgoing mailbox.  9. If for some reason you have misplaced your box then call our office and we can provide another box and/or mail it off for you.       Follow-Up: At [facility], you and your health needs are our priority.  As part of our continuing mission to provide you with exceptional heart care, we have created designated Provider Care Teams.  These Care Teams include your primary Cardiologist (physician) and Advanced Practice Providers (APPs -  Physician Assistants and Nurse Practitioners) who all work together to provide you with the care you need, when you need it.  We recommend signing up for the patient portal called "MyChart".  Sign up information is provided on this After Visit Summary.  MyChart is used to connect with patients for Virtual Visits (Telemedicine).  Patients are able to view lab/test results, encounter notes, upcoming  appointments, etc.  Non-urgent messages can be sent to your provider as well.   To learn more about what you can do with MyChart, go to [email or web address].    Your next appointment:  6 week(s)  The format for your next appointment:   In Person  Provider:   Kate Sable, MD   Other Instructions      Signed, Kate Sable, MD  10/26/2020 12:58 PM    [place]

## 2020-10-26 NOTE — Patient Instructions (Signed)
Medication Instructions:  Your physician recommends that you continue on your current medications as directed. Please refer to the Current Medication list given to you today.  *If you need a refill on your cardiac medications before your next appointment, please call your pharmacy*   Lab Work: None ordered If you have labs (blood work) drawn today and your tests are completely normal, you will receive your results only by: . MyChart Message (if you have MyChart) OR . A paper copy in the mail If you have any lab test that is abnormal or we need to change your treatment, we will call you to review the results.   Testing/Procedures:  1.  Your physician has requested that you have a carotid duplex. This test is an ultrasound of the carotid arteries in your neck. It looks at blood flow through these arteries that supply the brain with blood. Allow one hour for this exam. There are no restrictions or special instructions.  2.  Your physician has recommended that you wear a Zio monitor for 2 weeks. This monitor is a medical device that records the heart's electrical activity. Doctors most often use these monitors to diagnose arrhythmias. Arrhythmias are problems with the speed or rhythm of the heartbeat. The monitor is a small device applied to your chest. You can wear one while you do your normal daily activities. While wearing this monitor if you have any symptoms to push the button and record what you felt. Once you have worn this monitor for the period of time provider prescribed (Usually 14 days), you will return the monitor device in the postage paid box. Once it is returned they will download the data collected and provide us with a report which the provider will then review and we will call you with those results. Important tips:  1. Avoid showering during the first 24 hours of wearing the monitor. 2. Avoid excessive sweating to help maximize wear time. 3. Do not submerge the device, no hot  tubs, and no swimming pools. 4. Keep any lotions or oils away from the patch. 5. After 24 hours you may shower with the patch on. Take brief showers with your back facing the shower head.  6. Do not remove patch once it has been placed because that will interrupt data and decrease adhesive wear time. 7. Push the button when you have any symptoms and write down what you were feeling. 8. Once you have completed wearing your monitor, remove and place into box which has postage paid and place in your outgoing mailbox.  9. If for some reason you have misplaced your box then call our office and we can provide another box and/or mail it off for you.         Follow-Up: At [facility], you and your health needs are our priority.  As part of our continuing mission to provide you with exceptional heart care, we have created designated Provider Care Teams.  These Care Teams include your primary Cardiologist (physician) and Advanced Practice Providers (APPs -  Physician Assistants and Nurse Practitioners) who all work together to provide you with the care you need, when you need it.  We recommend signing up for the patient portal called "MyChart".  Sign up information is provided on this After Visit Summary.  MyChart is used to connect with patients for Virtual Visits (Telemedicine).  Patients are able to view lab/test results, encounter notes, upcoming appointments, etc.  Non-urgent messages can be sent to your provider as   well.   To learn more about what you can do with MyChart, go to [email or web address].    Your next appointment:   6 week(s)  The format for your next appointment:   In Person  Provider:   Brian Agbor-Etang, MD   Other Instructions  

## 2020-10-29 ENCOUNTER — Telehealth: Payer: Self-pay | Admitting: Cardiology

## 2020-10-29 NOTE — Telephone Encounter (Signed)
Patients son calling in regarding a heart monitor that was received in the mail. Patient had a monitor placed in office on Friday, ordered by Dr. Garen Lah. The monitor that received in the mail was ordered by the hospital. Patients son is wanting some guidance on what to do with the unused monitor. Patient states the monitor is from [employer]  Please advise

## 2020-10-30 ENCOUNTER — Telehealth: Payer: Self-pay | Admitting: *Deleted

## 2020-10-30 NOTE — Telephone Encounter (Signed)
[facility] called for POC 1wk4.  Approval given.

## 2020-10-30 NOTE — Telephone Encounter (Signed)
It looks like a [employer] monitor was ordered by [employer] and sent by Markus Daft. I sent her a message asking her to contact the patient with instructions on how to return this monitor as we do not work with that company and the patient is currently wearing a Zio monitor.

## 2020-11-08 ENCOUNTER — Encounter: Payer: Self-pay | Admitting: Physical Medicine & Rehabilitation

## 2020-11-08 ENCOUNTER — Other Ambulatory Visit: Payer: Self-pay

## 2020-11-08 ENCOUNTER — Encounter
Payer: BC Managed Care – PPO | Attending: Physical Medicine & Rehabilitation | Admitting: Physical Medicine & Rehabilitation

## 2020-11-08 VITALS — BP 146/85 | HR 63 | Temp 98.4°F | Ht 63.0 in | Wt 107.0 lb

## 2020-11-08 DIAGNOSIS — I63512 Cerebral infarction due to unspecified occlusion or stenosis of left middle cerebral artery: Secondary | ICD-10-CM

## 2020-11-08 DIAGNOSIS — J449 Chronic obstructive pulmonary disease, unspecified: Secondary | ICD-10-CM | POA: Diagnosis present

## 2020-11-08 DIAGNOSIS — R41844 Frontal lobe and executive function deficit: Secondary | ICD-10-CM | POA: Diagnosis present

## 2020-11-08 DIAGNOSIS — G894 Chronic pain syndrome: Secondary | ICD-10-CM | POA: Diagnosis present

## 2020-11-08 NOTE — Progress Notes (Signed)
Subjective:    Patient ID: Natalie [name], female    DOB: 1956/03/01, 65 y.o.   MRN: [id number]  HPI Female with history of COPD-oxygen dependent, Hep C, remote seizures, CKD, Chronic pain-on methadone presents for follow up for left MCA stroke with aphasia/confusion.  Son supplements history. At discharge, she was instructed to follow up with PCP, which she did. She had an appointment with Neuro.  She saw Cards, Pulm. She continues to follow up with [place] [facility]. She is taking 40mg  daily - continue per clinic and determine weaning thereafter. She denies breathing difficulty. She notes improvement in cognition. She is on regular diet, denies swallowing difficulty.  Denies falls.   Therapies: Released from PT/OT, continue SLP DME: Shower chair Mobility: No assistive device required  Pain Inventory Average Pain 0 Pain Right Now 0 My pain is no pain  LOCATION OF PAIN  na  BOWEL Number of stools per week: 5 Oral laxative use No  Type of laxative na Enema or suppository use No  History of colostomy No  Incontinent No   BLADDER Normal In and out cath, frequency na Able to self cath na Bladder incontinence No  Frequent urination No  Leakage with coughing No  Difficulty starting stream No  Incomplete bladder emptying No    Mobility walk without assistance ability to climb steps?  yes  Function not employed: date last employed .  Neuro/Psych No problems in this area  Prior Studies HFU  Physicians involved in your care HFU   Family History  Problem Relation Age of Onset  . Breast cancer Paternal Grandmother    Social History   Socioeconomic History  . Marital status: Married    Spouse name: Not on file  . Number of children: Not on file  . Years of education: Not on file  . Highest education level: Not on file  Occupational History  . Not on file  Tobacco Use  . Smoking status: Former Smoker    Packs/day: 0.50    Years: 41.00    Pack years: 20.50     Types: Cigarettes    Quit date: 10/27/2015    Years since quitting: 5.0  . Smokeless tobacco: Never Used  Vaping Use  . Vaping Use: Never used  Substance and Sexual Activity  . Alcohol use: No  . Drug use: No  . Sexual activity: Not Currently  Other Topics Concern  . Not on file  Social History Narrative   siginificant other recently passed away   Social Determinants of Health   Financial Resource Strain: Not on file  Food Insecurity: Not on file  Transportation Needs: Not on file  Physical Activity: Not on file  Stress: Not on file  Social Connections: Not on file   Past Surgical History:  Procedure Laterality Date  . APPENDECTOMY    . CHOLECYSTECTOMY    . COLONOSCOPY WITH PROPOFOL N/A 04/30/2015   Procedure: COLONOSCOPY WITH PROPOFOL;  Surgeon: Josefine Class, MD;  Location: [facility] ENDOSCOPY;  Service: Endoscopy;  Laterality: N/A;  . COLONOSCOPY WITH PROPOFOL N/A 07/13/2017   Procedure: COLONOSCOPY WITH PROPOFOL;  Surgeon: Manya Silvas, MD;  Location: [facility] ENDOSCOPY;  Service: Endoscopy;  Laterality: N/A;  . ESOPHAGOGASTRODUODENOSCOPY (EGD) WITH PROPOFOL N/A 07/13/2017   Procedure: ESOPHAGOGASTRODUODENOSCOPY (EGD) WITH PROPOFOL;  Surgeon: Manya Silvas, MD;  Location: [facility] ENDOSCOPY;  Service: Endoscopy;  Laterality: N/A;  . EYE SURGERY     Past Medical History:  Diagnosis Date  . Cataracts, bilateral   .  Chickenpox cataracts  . COPD (chronic obstructive pulmonary disease) ([place])   . Depression   . Drug addiction ([place])   . Headache   . Hepatitis C virus infection   . Hepatitis C without hepatic coma   . History of chicken pox   . History of colon polyps   . Seizures ([facility])    BP (!) 146/85   Pulse 63   Temp 98.4 F (36.9 C)   Ht 5\' 3"  (1.6 m)   Wt 107 lb (48.5 kg)   SpO2 98%   BMI 18.95 kg/m   Opioid Risk Score:   Fall Risk Score:  `1  Depression screen PHQ 2/9  No flowsheet data found.  Review of Systems  Respiratory: Positive for shortness  of breath.   Musculoskeletal: Negative.   Neurological: Negative for tremors, weakness and numbness.  All other systems reviewed and are negative.      Objective:   Physical Exam  Constitutional: No distress . Vital signs reviewed. HENT: Normocephalic.  Atraumatic. Eyes: EOMI. No discharge. Cardiovascular: No JVD.   Respiratory: Normal effort.  No stridor.  +[place]. GI: Non-distended.   Skin: Warm and dry.  Intact. Psych: Normal mood.  Normal behavior. Musc: No edema in extremities.  No tenderness in extremities. Neuro: Alert and oriented x3 Motor: Grossly 5/5    Assessment & Plan:  Female with history of COPD-oxygen dependent, Hep C, remote seizures, CKD, Chronic pain-on methadone presents for follow up for left MCA stroke with aphasia/confusion  1. L MCA stroke with severe aphasia/confusion   Cont SLP  Improving  2. Impaired mobility:  2. Chronic pain/Pain Management:   Cont to follow up at Methadone clinic  3. COPD:   Cont 3L per [place]   Cont to follow up with Pulm  4. Encephalopathy/Aphasia/confusion/anomia:   Significantly improved  Cont therapies  Meds reviewed Referrals reviewed All questions answered

## 2020-11-12 ENCOUNTER — Telehealth: Payer: Self-pay | Admitting: *Deleted

## 2020-11-12 NOTE — Telephone Encounter (Signed)
Tiffany from [facility] reports patient has denied home health aide services

## 2020-11-15 ENCOUNTER — Encounter: Payer: Self-pay | Admitting: Adult Health

## 2020-11-15 ENCOUNTER — Ambulatory Visit (INDEPENDENT_AMBULATORY_CARE_PROVIDER_SITE_OTHER): Payer: BC Managed Care – PPO | Admitting: Adult Health

## 2020-11-15 VITALS — BP 141/69 | HR 62 | Ht 63.0 in | Wt 111.0 lb

## 2020-11-15 DIAGNOSIS — E785 Hyperlipidemia, unspecified: Secondary | ICD-10-CM | POA: Diagnosis not present

## 2020-11-15 DIAGNOSIS — I63412 Cerebral infarction due to embolism of left middle cerebral artery: Secondary | ICD-10-CM | POA: Diagnosis not present

## 2020-11-15 DIAGNOSIS — E538 Deficiency of other specified B group vitamins: Secondary | ICD-10-CM

## 2020-11-15 DIAGNOSIS — I69319 Unspecified symptoms and signs involving cognitive functions following cerebral infarction: Secondary | ICD-10-CM

## 2020-11-15 DIAGNOSIS — I10 Essential (primary) hypertension: Secondary | ICD-10-CM | POA: Diagnosis not present

## 2020-11-15 NOTE — Progress Notes (Signed)
[facility] [address] [place]. [place] [address] [phone]       HOSPITAL FOLLOW UP NOTE  [name] [name] Date of Birth:  [birth year] Medical Record Number:  [id number]   Reason for Referral:  hospital stroke follow up    SUBJECTIVE:   CHIEF COMPLAINT:  Chief Complaint  Patient presents with  . Follow-up    RM14 wihr son Rodman Key) Pt is well and stable, doing great.     HPI:   [name] a 65 y.o.femalewith history of Cataracts, bilateral, Chickenpox (cataracts), COPD (chronic obstructive pulmonary disease) ([place]), Depression, Drug addiction ([place]), Headache, Hepatitis C virus infection, Hepatitis C without hepatic coma, History of chicken pox, History of colon polyps, and Seizures (HCC)who presented on 3/27/2022with altered mental statusonday of admission.  Personally reviewed hospitalization pertinent progress notes, lab work and imaging with summary provided.  Stroke team evaluated patient on 10/10/2020 after repeat MRI on 4/5 showed acute infarct of left MCA (initial MRI negative).  Concern of embolic pattern and recommended 30 cardiac event monitor outpatient to further evaluate for possible A. fib. Recommended starting plavix with hx of aspirin intolerance. Concern of cognitive impairment with MMSE 12/30 with lack of insight, disorientation and confabulation - B12 85 and initiated supplement. LDL 136 - started atorvastatin 40mg  daily. Other stroke risk factors include tobacco use, migraines and questionable EtOH use. Evaluated by therapies and recommended CIR for ongoing therapy needs.   Stroke: Anterior Left MCA Infarct,embolicpattern, source unclear  MRI brain left anterior MCA infarct  MRAhead and necknegative  LEvenous Dopplernegative  Recommend 30 day cardioembolic monitoring  2D Echo: LVEF:55 to 60%  LDL136  HgbA1c5.6  UDS + methadone (home meds)  VTE prophylaxis -SCDs  No antithromboticprior to admission,  now onclopidogrel 75 mg daily.Allergy to ASA.  Therapy recommendations:CIR recommended and accepted  Disposition:d/c to CIR on 4/7   Today, 11/15/2020, Natalie [name] [name] is being seen for hospital follow-up accompanied by her son.  She was discharged home from CIR on 10/19/2020 after an 8-day stay.  Completed HH SLP on Monday with residual cognitive difficulties but overall recovering well and per son, he believes she has been doing better than her cognitive baseline prior to his stroke.  She continues to routinely do exercises as advised at home. Remains in her own home but her son has been staying with her.  She is able to maintain ADLs and majority of IADLs independently and son will assist as needed.  MMSE today 22/30 (12/30 during hospitalization).  She had greater difficulty with focusing and concentration as when we would move on to the next question such as place, she continuously tried to go back to time questions.  She has not yet returned back to driving.  Denies any functional deficits.  Denies new stroke/TIA symptoms  Remains on Plavix and atorvastatin without associated side effects Blood pressure today 141/69 -does not routinely monitor at home is typically stable PCP recently recheck B12 level which was >4000 therefore supplement placed on hold and plans on repeating level in a couple of months Denies any current tobacco use (quit 6 years ago), EtOH or drug use Continued use of home oxygen 3L via [place] as needed Completed 30-day cardiac event monitor which did not show evidence of atrial fibrillation  No further concerns at this time  MMSE - [place] Exam 11/15/2020  Orientation to time 3  Orientation to Place 3  Registration 3  Attention/ Calculation 3  Recall 1  Language-  name 2 objects 2  Language- repeat 1  Language- follow 3 step command 3  Language- read & follow direction 1  Write a sentence 1  Copy design 1  Total score 22      ROS:   14 system review of  systems performed and negative with exception of those listed in HPI    PMH:  Past Medical History:  Diagnosis Date  . Cataracts, bilateral   . Chickenpox cataracts  . COPD (chronic obstructive pulmonary disease) ([place])   . Depression   . Drug addiction ([place])   . Headache   . Hepatitis C virus infection   . Hepatitis C without hepatic coma   . History of chicken pox   . History of colon polyps   . Seizures (HCC)     PSH:  Past Surgical History:  Procedure Laterality Date  . APPENDECTOMY    . CHOLECYSTECTOMY    . COLONOSCOPY WITH PROPOFOL N/A 04/30/2015   Procedure: COLONOSCOPY WITH PROPOFOL;  Surgeon: Josefine Class, MD;  Location: [facility] ENDOSCOPY;  Service: Endoscopy;  Laterality: N/A;  . COLONOSCOPY WITH PROPOFOL N/A 07/13/2017   Procedure: COLONOSCOPY WITH PROPOFOL;  Surgeon: Manya Silvas, MD;  Location: [facility] ENDOSCOPY;  Service: Endoscopy;  Laterality: N/A;  . ESOPHAGOGASTRODUODENOSCOPY (EGD) WITH PROPOFOL N/A 07/13/2017   Procedure: ESOPHAGOGASTRODUODENOSCOPY (EGD) WITH PROPOFOL;  Surgeon: Manya Silvas, MD;  Location: [facility] ENDOSCOPY;  Service: Endoscopy;  Laterality: N/A;  . EYE SURGERY      Social History:  Social History   Socioeconomic History  . Marital status: Married    Spouse name: Not on file  . Number of children: Not on file  . Years of education: Not on file  . Highest education level: Not on file  Occupational History  . Not on file  Tobacco Use  . Smoking status: Former Smoker    Packs/day: 0.50    Years: 41.00    Pack years: 20.50    Types: Cigarettes    Quit date: 10/27/2015    Years since quitting: 5.0  . Smokeless tobacco: Never Used  Vaping Use  . Vaping Use: Never used  Substance and Sexual Activity  . Alcohol use: No  . Drug use: No  . Sexual activity: Not Currently  Other Topics Concern  . Not on file  Social History Narrative   siginificant other recently passed away   Social Determinants of Health   Financial  Resource Strain: Not on file  Food Insecurity: Not on file  Transportation Needs: Not on file  Physical Activity: Not on file  Stress: Not on file  Social Connections: Not on file  Intimate Partner Violence: Not on file    Family History:  Family History  Problem Relation Age of Onset  . Breast cancer Paternal Grandmother     Medications:   Current Outpatient Medications on File Prior to Visit  Medication Sig Dispense Refill  . atorvastatin (LIPITOR) 40 MG tablet Take 1 tablet (40 mg total) by mouth daily. 30 tablet 0  . clopidogrel (PLAVIX) 75 MG tablet Take 1 tablet (75 mg total) by mouth daily. 30 tablet 0  . methadone (DOLOPHINE) 1 mg/ml oral solution Take 45 mg/kg by mouth daily.     No current facility-administered medications on file prior to visit.    Allergies:   Allergies  Allergen Reactions  . Sulfasalazine Shortness Of Breath  . Aspirin   . Darvon [Propoxyphene]   . Iodine   . Penicillins   .  Sulfa Antibiotics   . Zithromax [Azithromycin]       OBJECTIVE:  Physical Exam  Vitals:   11/15/20 1111  BP: (!) 141/69  Pulse: 62  Weight: 111 lb (50.3 kg)  Height: 5\' 3"  (1.6 m)   Body mass index is 19.66 kg/m. No exam data present  General: Frail very pleasant older appearing than stated age 80 female wearing 3L O2 via [place], seated, in no evident distress Head: head normocephalic and atraumatic.   Neck: supple with no carotid or supraclavicular bruits Cardiovascular: regular rate and rhythm, no murmurs Musculoskeletal: no deformity Skin:  no rash/petichiae Vascular:  Normal pulses all extremities   Neurologic Exam Mental Status: Awake and fully alert.   Fluent speech and language. Recent memory impaired and remote memory intact. Attention span, concentration and fund of knowledge mildly diminished.  MMSE today 22/30 with deficits in attention/calculation, recall and orientation to time and place.  Mood and affect appropriate.  Cranial Nerves:  Fundoscopic exam reveals sharp disc margins. Pupils equal, briskly reactive to light. Extraocular movements full without nystagmus. Visual fields full to confrontation. Hearing intact. Facial sensation intact. Face, tongue, palate moves normally and symmetrically.  Motor: Normal bulk and tone. Normal strength in all tested extremity muscles Sensory.: intact to touch , pinprick , position and vibratory sensation.  Coordination: Rapid alternating movements normal in all extremities. Finger-to-nose and heel-to-shin performed accurately bilaterally. Gait and Station: Arises from chair without difficulty. Stance is normal. Gait demonstrates normal stride length and balance without use of assistive device.  Difficulty performing tandem walk and heel toe Reflexes: 1+ and symmetric. Toes downgoing.     NIHSS  0 Modified Rankin  3      ASSESSMENT: Natalie [name] [name] is a 65 y.o. year old female presented with altered mental status on 09/30/2020 with initial MRI negative. On 4/5, MRI showed acute infarct of L MCA infarct, embolic secondary to unknown source. Vascular risk factors include HTN, HLD, hx of tobacco use, B12 deficiency and migraines.      PLAN:  1. L MCA stroke :  a. Residual deficit: cognitive impairment - overall improving since discharge but continues to have moderate difficulties - highly recommend continued participation with [facility] SLP and consider transitioning to outpatient therapies once completed.  b. May consider neurocog eval if indicated in the future c. Cardiac monitor negative for atrial fibrillation d. Continue clopidogrel 75 mg daily  and atorvastatin  for secondary stroke prevention.  Discussed secondary stroke prevention measures and importance of close PCP follow up for aggressive stroke risk factor management  2. HTN: BP goal <130/90.  Stable on current regimen per PCP 3. HLD: LDL goal <70. Recent LDL 136 - started atorvastatin 40mg  daily. Request f/u with PCP for repeat lipid  panel and ongoing prescribing of statin 4. B12 deficiency: B12 85 started supplement - repeat B12 4115 on 4/26 - supplement d/c'd by PCP - plans on repeating around June or July     Follow up in 3 months or call earlier if needed   CC:  [facility] provider: Dr. Leonie Man PCP: Henrietta Hoover, MD    I spent 57 minutes of face-to-face and non-face-to-face time with patient and son.  This included previsit chart review including recent hospitalization progress notes, lab work and imaging, recent lab review since d/c, extensive conversation regarding recent stroke and etiology, residual deficits and typical recovery time, completion and review of MMSE, education regarding secondary stroke prevention measures and answered all other questions to  patient and sons satisfaction   Frann Rider, AGNP-BC  [facility] [address] [place], [place] [address]  Phone [phone] Fax [phone] Note: This document was prepared with digital dictation and possible smart phrase technology. Any transcriptional errors that result from this process are unintentional.

## 2020-11-15 NOTE — Patient Instructions (Addendum)
Continue clopidogrel 75 mg daily  and atorvastatin  for secondary stroke prevention  Ensure follow up with your PCP for repeat B12 levels and possibly restart B12 supplement  Continue to follow up with PCP regarding cholesterol and blood pressure management  Maintain strict control of hypertension with blood pressure goal below 130/90 and cholesterol with LDL cholesterol (bad cholesterol) goal below 70 mg/dL.       Followup in the future with me in 4 months or call earlier if needed       Thank you for coming to see [place] at [facility]. I hope we have been able to provide you high quality care today.  You may receive a patient satisfaction survey over the next few weeks. We would appreciate your feedback and comments so that we may continue to improve ourselves and the health of our patients.   Mild Neurocognitive Disorder Mild neurocognitive disorder, formerly known as mild cognitive impairment, is a disorder in which memory does not work as well as it should. This disorder may also cause problems with other mental functions, including thought, communication, behavior, and completion of tasks. These problems can be noticed and measured, but they usually do not interfere with daily activities or the ability to live independently. Mild neurocognitive disorder typically develops after 63 years of age, but it can also develop at younger ages. It is not as serious as major neurocognitive disorder, also known as dementia, but it may be the first sign of it. Generally, symptoms of this condition get worse over time. In rare cases, symptoms can get better. What are the causes? This condition may be caused by:  Brain disorders like Alzheimer's disease, Parkinson's disease, and other conditions that gradually damage nerve cells (neurodegenerative conditions).  Diseases that affect blood vessels in the brain and result in small strokes.  Certain infections, such as  HIV.  Traumatic brain injury.  Other medical conditions, such as brain tumors, underactive thyroid (hypothyroidism), and vitamin B12 deficiency.  Use of certain drugs or prescription medicines. What increases the risk? The following factors may make you more likely to develop this condition:  Being older than 65 years.  Being female.  Low education level.  Diabetes, high blood pressure, high cholesterol, and other conditions that increase the risk for blood vessel diseases.  Untreated or undertreated sleep apnea.  Having a certain type of gene that can be passed from parent to child (inherited).  Chronic health problems such as heart disease, lung disease, liver disease, kidney disease, or depression. What are the signs or symptoms? Symptoms of this condition include:  Difficulty remembering. You may: ? Forget names, phone numbers, or details of recent events. ? Forget social events and appointments. ? Repeatedly forget where you put your car keys or other items.  Difficulty thinking and solving problems. You may have trouble with complex tasks, such as: ? Paying bills. ? Driving in unfamiliar places.  Difficulty communicating. You may have trouble: ? Finding the right word or naming an object. ? Forming a sentence that makes sense, or understanding what you read or hear.  Changes in your behavior or personality. When this happens, you may: ? Lose interest in the things that you used to enjoy. ? Withdraw from social situations. ? Get angry more easily than usual. ? Act before thinking. How is this diagnosed? This condition is diagnosed based on:  Your symptoms. Your health care provider may ask you and the people you spend time with, such as family  and friends, about your symptoms.  Evaluation of mental functions (neuropsychological testing). Your health care provider may refer you to a neurologist or mental health specialist to evaluate your mental functions in  detail. To identify the cause of your condition, your health care provider may:  Get a detailed medical history.  Ask about use of alcohol, drugs, and prescription medicines.  Do a physical exam.  Order blood tests and brain imaging exams. How is this treated? Mild neurocognitive disorder that is caused by medicine use, drug use, infection, or another medical condition may improve when the cause is treated, or when medicines or drugs are stopped. If this disorder has another cause, it generally does not improve and may get worse. In these cases, the goal of treatment is to help you manage the loss of mental function. Treatments in these cases include:  Medicine. Medicine mainly helps memory and behavior symptoms.  Talk therapy. Talk therapy provides education, emotional support, memory aids, and other ways of making up for problems with mental function.  Lifestyle changes, including: ? Getting regular exercise. ? Eating a healthy diet that includes omega-3 fatty acids. ? Challenging your thinking and memory skills. ? Having more social interaction. Follow these instructions at home: Eating and drinking  Drink enough fluid to keep your urine pale yellow.  Eat a healthy diet that includes omega-3 fatty acids. These can be found in: ? Fish. ? Nuts. ? Leafy vegetables. ? Vegetable oils.  If you drink alcohol: ? Limit how much you use to:  0-1 drink a day for women.  0-2 drinks a day for men. ? Be aware of how much alcohol is in your drink. In the [place] one drink equals one 12 oz bottle of beer (355 mL), one 5 oz glass of wine (148 mL), or one 1 oz glass of hard liquor (44 mL).   Lifestyle  Get regular exercise as told by your health care provider.  Do not use any products that contain nicotine or tobacco, such as cigarettes, e-cigarettes, and chewing tobacco. If you need help quitting, ask your health care provider.  Practice ways to manage stress. If you need help managing  stress, ask your health care provider.  Continue to have social interaction.  Keep your mind active with stimulating activities you enjoy, such as reading or playing games.  Make sure to get quality sleep. Follow these tips: ? Avoid napping during the day. ? Keep your sleeping area dark and cool. ? Avoid exercising during the few hours before you go to bed. ? Avoid caffeine products in the evening.   General instructions  Take over-the-counter and prescription medicines only as told by your health care provider. Your health care provider may recommend that you avoid taking medicines that can affect thinking, such as pain medicines or sleep medicines.  Work with your health care provider to find out what you need help with and what your safety needs are.  Keep all follow-up visits. This is important. Where to find more information  [employer] on Aging: http://kim-miller.com/ Contact a health care provider if:  You have any new symptoms. Get help right away if:  You develop new confusion or your confusion gets worse.  You act in ways that place you or your family in danger. Summary  Mild neurocognitive disorder is a disorder in which memory does not work as well as it should.  Mild neurocognitive disorder can have many causes. It may be the first stage of dementia.  To manage your condition, get regular exercise, keep your mind active, get quality sleep, and eat a healthy diet. This information is not intended to replace advice given to you by your health care provider. Make sure you discuss any questions you have with your health care provider. Document Revised: 11/07/2019 Document Reviewed: 11/07/2019 Elsevier Patient Education  [place].

## 2020-11-16 NOTE — Progress Notes (Signed)
I agree with the above plan 

## 2020-11-19 ENCOUNTER — Other Ambulatory Visit: Payer: Self-pay | Admitting: Physical Medicine and Rehabilitation

## 2020-11-27 ENCOUNTER — Other Ambulatory Visit: Payer: Self-pay

## 2020-11-27 ENCOUNTER — Ambulatory Visit (INDEPENDENT_AMBULATORY_CARE_PROVIDER_SITE_OTHER): Payer: BC Managed Care – PPO

## 2020-11-27 DIAGNOSIS — I63512 Cerebral infarction due to unspecified occlusion or stenosis of left middle cerebral artery: Secondary | ICD-10-CM | POA: Diagnosis not present

## 2020-11-27 DIAGNOSIS — I6330 Cerebral infarction due to thrombosis of unspecified cerebral artery: Secondary | ICD-10-CM

## 2020-12-11 ENCOUNTER — Encounter: Payer: Self-pay | Admitting: Cardiology

## 2020-12-11 ENCOUNTER — Ambulatory Visit (INDEPENDENT_AMBULATORY_CARE_PROVIDER_SITE_OTHER): Payer: BC Managed Care – PPO | Admitting: Cardiology

## 2020-12-11 ENCOUNTER — Other Ambulatory Visit: Payer: Self-pay

## 2020-12-11 VITALS — BP 130/70 | HR 57 | Ht 65.0 in | Wt 111.0 lb

## 2020-12-11 DIAGNOSIS — E7800 Pure hypercholesterolemia, unspecified: Secondary | ICD-10-CM

## 2020-12-11 DIAGNOSIS — I6330 Cerebral infarction due to thrombosis of unspecified cerebral artery: Secondary | ICD-10-CM

## 2020-12-11 NOTE — Progress Notes (Signed)
Cardiology Office Note:    Date:  12/11/2020   ID:  Natalie [name], DOB 07/05/1956, MRN [id number]  PCP:  Henrietta Hoover, MD   [place]  Cardiologist:  Kate Sable, MD  Advanced Practice Provider:  No care team member to display Electrophysiologist:  None       Referring MD: Lorenza Cambridge*   Chief Complaint  Patient presents with  . Other    6 week follow up. Meds reviewed verbally with patient.     History of Present Illness:    Natalie [name] is a 65 y.o. female with a hx of COPD, hep C, former smoker x40+ years, chronic pain on methadone who presents for follow-up.  Previously seen due to CVA.  Cardiac monitor was placed to evaluate presence of cardiac etiology for CVA such as A. fib or flutter.  She presents for follow-up after cardiac monitor, has no new concerns at this time.   Prior notes Echocardiogram performed 10/10/2020 showed normal systolic function, EF 55 to 60%, with no findings to suggest cardioembolic cause.  Ultrasound lower extremity venous 10/10/2020 showed no evidence for DVT.  Patient was admitted to the hospital 10/12/2019 with altered mental status.  Work-up via MRI brain revealed a left MCA infarct.  Patient started on Plavix due to aspirin allergy.  Cardiac monitor recommended by neurology.  Past Medical History:  Diagnosis Date  . Cataracts, bilateral   . Chickenpox cataracts  . COPD (chronic obstructive pulmonary disease) ([place])   . Depression   . Drug addiction ([place])   . Headache   . Hepatitis C virus infection   . Hepatitis C without hepatic coma   . History of chicken pox   . History of colon polyps   . Seizures ([place])     Past Surgical History:  Procedure Laterality Date  . APPENDECTOMY    . CHOLECYSTECTOMY    . COLONOSCOPY WITH PROPOFOL N/A 04/30/2015   Procedure: COLONOSCOPY WITH PROPOFOL;  Surgeon: Josefine Class, MD;  Location: [facility] ENDOSCOPY;  Service: Endoscopy;  Laterality: N/A;  .  COLONOSCOPY WITH PROPOFOL N/A 07/13/2017   Procedure: COLONOSCOPY WITH PROPOFOL;  Surgeon: Manya Silvas, MD;  Location: [facility] ENDOSCOPY;  Service: Endoscopy;  Laterality: N/A;  . ESOPHAGOGASTRODUODENOSCOPY (EGD) WITH PROPOFOL N/A 07/13/2017   Procedure: ESOPHAGOGASTRODUODENOSCOPY (EGD) WITH PROPOFOL;  Surgeon: Manya Silvas, MD;  Location[facility] ENDOSCOPY;  Service: Endoscopy;  Laterality: N/A;  . EYE SURGERY      Current Medications: Current Meds  Medication Sig  . atorvastatin (LIPITOR) 40 MG tablet Take 1 tablet (40 mg total) by mouth daily.  . clopidogrel (PLAVIX) 75 MG tablet Take 1 tablet (75 mg total) by mouth daily.  . methadone (DOLOPHINE) 1 mg/ml oral solution Take 45 mg/kg by mouth daily.     Allergies:   Sulfasalazine, Aspirin, Darvon [propoxyphene], Iodine, Penicillins, Sulfa antibiotics, and Zithromax [azithromycin]   Social History   Socioeconomic History  . Marital status: Married    Spouse name: Not on file  . Number of children: Not on file  . Years of education: Not on file  . Highest education level: Not on file  Occupational History  . Not on file  Tobacco Use  . Smoking status: Former Smoker    Packs/day: 0.50    Years: 41.00    Pack years: 20.50    Types: Cigarettes    Quit date: 10/27/2015    Years since quitting: 5.1  . Smokeless  tobacco: Never Used  Vaping Use  . Vaping Use: Never used  Substance and Sexual Activity  . Alcohol use: No  . Drug use: No  . Sexual activity: Not Currently  Other Topics Concern  . Not on file  Social History Narrative   siginificant other recently passed away   Social Determinants of Health   Financial Resource Strain: Not on file  Food Insecurity: Not on file  Transportation Needs: Not on file  Physical Activity: Not on file  Stress: Not on file  Social Connections: Not on file     Family History: The patient's family history includes Breast cancer in her paternal grandmother.  ROS:   Please see the  history of present illness.     All other systems reviewed and are negative.  EKGs/Labs/Other Studies Reviewed:    The following studies were reviewed today:   EKG:  EKG is  ordered today.  The ekg ordered today demonstrates sinus bradycardia, otherwise normal ECG  Recent Labs: 10/02/2020: Magnesium 1.9; TSH 7.203 10/12/2020: ALT 16 10/15/2020: BUN 21; Creatinine, Ser 1.04; Hemoglobin 13.2; Platelets 288; Potassium 3.8; Sodium 139  Recent Lipid Panel    Component Value Date/Time   CHOL 207 (H) 10/10/2020 0406   TRIG 69 10/10/2020 0406   TRIG 95 05/05/2012 0351   HDL 57 10/10/2020 0406   CHOLHDL 3.6 10/10/2020 0406   VLDL 14 10/10/2020 0406   LDLCALC 136 (H) 10/10/2020 0406     Risk Assessment/Calculations:      Physical Exam:    VS:  BP 130/70 (BP Location: Left Arm, Patient Position: Sitting, Cuff Size: Normal)   Pulse (!) 57   Ht 5\' 5"  (1.651 m)   Wt 111 lb (50.3 kg)   SpO2 96%   BMI 18.47 kg/m     Wt Readings from Last 3 Encounters:  12/11/20 111 lb (50.3 kg)  11/15/20 111 lb (50.3 kg)  11/08/20 107 lb (48.5 kg)     GEN:  Well nourished, well developed in no acute distress HEENT: Normal NECK: No JVD; No carotid bruits LYMPHATICS: No lymphadenopathy CARDIAC: Bradycardia, no murmurs RESPIRATORY:  Clear to auscultation without rales, wheezing or rhonchi  ABDOMEN: Soft, non-tender, non-distended MUSCULOSKELETAL:  No edema; No deformity  SKIN: Warm and dry NEUROLOGIC:  Alert and oriented x 3 PSYCHIATRIC:  Normal affect   ASSESSMENT:    1. Cerebral thrombosis with cerebral infarction   2. Pure hypercholesterolemia    PLAN:    In order of problems listed above:  1. History of CVA, echocardiogram with normal ejection fraction, no thrombus findings to suggest cardiac embolism.  Cardiac monitor with no A. fib or atrial flutter.  carotid ultrasound with no significant obstruction.  Continue Plavix, Lipitor.  Keep appointments with neurology, primary care  physician. 2. Hyperlipidemia, continue Lipitor.  Follow-up as needed     Medication Adjustments/Labs and Tests Ordered: Current medicines are reviewed at length with the patient today.  Concerns regarding medicines are outlined above.  No orders of the defined types were placed in this encounter.  No orders of the defined types were placed in this encounter.   Patient Instructions  Medication Instructions:  Your physician recommends that you continue on your current medications as directed. Please refer to the Current Medication list given to you today.  *If you need a refill on your cardiac medications before your next appointment, please call your pharmacy*   Lab Work: None ordered If you have labs (blood work) drawn today and your  tests are completely normal, you will receive your results only by: Marland Kitchen MyChart Message (if you have MyChart) OR . A paper copy in the mail If you have any lab test that is abnormal or we need to change your treatment, we will call you to review the results.   Testing/Procedures: None ordered   Follow-Up: At [facility], you and your health needs are our priority.  As part of our continuing mission to provide you with exceptional heart care, we have created designated Provider Care Teams.  These Care Teams include your primary Cardiologist (physician) and Advanced Practice Providers (APPs -  Physician Assistants and Nurse Practitioners) who all work together to provide you with the care you need, when you need it.  We recommend signing up for the patient portal called "MyChart".  Sign up information is provided on this After Visit Summary.  MyChart is used to connect with patients for Virtual Visits (Telemedicine).  Patients are able to view lab/test results, encounter notes, upcoming appointments, etc.  Non-urgent messages can be sent to your provider as well.   To learn more about what you can do with MyChart, go to [email or web address].     Your next appointment:   As needed  The format for your next appointment:   In Person  Provider:   You may see Kate Sable, MD or one of the following Advanced Practice Providers on your designated Care Team:    Murray Hodgkins, NP  Christell Faith, PA-C  Marrianne Mood, PA-C  Cadence Sherrill, [place]  Laurann [place], NP    Other Instructions N/A     Signed, Kate Sable, MD  12/11/2020 9:43 AM    [place]

## 2020-12-11 NOTE — Patient Instructions (Signed)
Medication Instructions:  Your physician recommends that you continue on your current medications as directed. Please refer to the Current Medication list given to you today.  *If you need a refill on your cardiac medications before your next appointment, please call your pharmacy*   Lab Work: None ordered If you have labs (blood work) drawn today and your tests are completely normal, you will receive your results only by: MyChart Message (if you have MyChart) OR A paper copy in the mail If you have any lab test that is abnormal or we need to change your treatment, we will call you to review the results.   Testing/Procedures: None ordered   Follow-Up: At [facility], you and your health needs are our priority.  As part of our continuing mission to provide you with exceptional heart care, we have created designated Provider Care Teams.  These Care Teams include your primary Cardiologist (physician) and Advanced Practice Providers (APPs -  Physician Assistants and Nurse Practitioners) who all work together to provide you with the care you need, when you need it.  We recommend signing up for the patient portal called "MyChart".  Sign up information is provided on this After Visit Summary.  MyChart is used to connect with patients for Virtual Visits (Telemedicine).  Patients are able to view lab/test results, encounter notes, upcoming appointments, etc.  Non-urgent messages can be sent to your provider as well.   To learn more about what you can do with MyChart, go to [email or web address].    Your next appointment:   As needed  The format for your next appointment:   In Person  Provider:   You may see Brian Agbor-Etang, MD or one of the following Advanced Practice Providers on your designated Care Team:   Christopher Berge, NP Ryan Dunn, PA-C Jacquelyn Visser, PA-C Cadence Furth, PA-C Caitlin Walker, NP   Other Instructions N/A  

## 2021-03-14 ENCOUNTER — Ambulatory Visit (INDEPENDENT_AMBULATORY_CARE_PROVIDER_SITE_OTHER): Payer: BC Managed Care – PPO | Admitting: Adult Health

## 2021-03-14 ENCOUNTER — Encounter: Payer: Self-pay | Admitting: Adult Health

## 2021-03-14 VITALS — BP 169/84 | HR 62 | Ht 64.0 in | Wt 123.0 lb

## 2021-03-14 DIAGNOSIS — I63412 Cerebral infarction due to embolism of left middle cerebral artery: Secondary | ICD-10-CM

## 2021-03-14 NOTE — Patient Instructions (Addendum)
Continue clopidogrel 75 mg daily  and atorvastatin for secondary stroke prevention  Continue to follow up with PCP regarding cholesterol and blood pressure management  Maintain strict control of hypertension with blood pressure goal below 130/90 and cholesterol with LDL cholesterol (bad cholesterol) goal below 70 mg/dL.      Followup in the future with me in 6 months or call earlier if needed       Thank you for coming to see [place] at [facility]. I hope we have been able to provide you high quality care today.  You may receive a patient satisfaction survey over the next few weeks. We would appreciate your feedback and comments so that we may continue to improve ourselves and the health of our patients.

## 2021-03-14 NOTE — Progress Notes (Signed)
[facility] [address] [place]. [place] [address] [phone]       STROKE FOLLOW UP NOTE  [name] Date of Birth:  07/07/1956 Medical Record Number:  [id number]   Reason for Referral: stroke follow up    SUBJECTIVE:   CHIEF COMPLAINT:  Chief Complaint  Patient presents with   Cerebrovascular Accident    Rm 3, 3 month FU, son-[name] MMSE  23 "no new concerns"     HPI:   Today, 03/14/2021, Natalie [name] returns for 15-monthstroke follow-up accompanied by her son.  Overall doing well.  Denies new stroke/TIA symptoms. Reports occasional word finding difficulty but overall improving and good with compensating.  MMSE today 23/30 (prior 22/30 - during stroke admission 12/30). She continues to maintain ADLs and majority of IADLs independently.  Her son does continue to live with her but plans on moving out soon.  She does question return to driving.  Compliant on Plavix and atorvastatin.  Blood pressure today 162/86 -similar on recheck. Routinely monitors at home and typically stable. No new concerns at this time    History provided for reference purposes only Initial visit 11/15/2020 JM: Natalie [name] being seen for hospital follow-up accompanied by her son.  She was discharged home from CIR on 10/19/2020 after an 8-day stay.  Completed HH SLP on Monday with residual cognitive difficulties but overall recovering well and per son, he believes she has been doing better than her cognitive baseline prior to her stroke.  She continues to routinely do exercises as advised at home. Remains in her own home but her son has been staying with her.  She is able to maintain ADLs and majority of IADLs independently and son will assist as needed.  MMSE today 22/30 (12/30 during hospitalization).  She had greater difficulty with focusing and concentration as when we would move on to the next question such as place, she continuously tried to go back to time questions.  She has not yet  returned back to driving.  Denies any functional deficits.  Denies new stroke/TIA symptoms  Remains on Plavix and atorvastatin without associated side effects Blood pressure today 141/69 -does not routinely monitor at home is typically stable PCP recently recheck B12 level which was >4000 therefore supplement placed on hold and plans on repeating level in a couple of months Denies any current tobacco use (quit 6 years ago), EtOH or drug use Continued use of home oxygen 3L via [place] as needed Completed 30-day cardiac event monitor which did not show evidence of atrial fibrillation  No further concerns at this time  Stroke admission 09/30/2020 Ms. Natalie BUSSEYis a 65y.o. female with history of Cataracts, bilateral, Chickenpox (cataracts), COPD (chronic obstructive pulmonary disease) ([place], Depression, Drug addiction ([place], Headache, Hepatitis C virus infection, Hepatitis C without hepatic coma, History of chicken pox, History of colon polyps, and Seizures ([place] who presented on 09/30/2020 with altered mental status on day of admission.  Personally reviewed hospitalization pertinent progress notes, lab work and imaging with summary provided.  Stroke team evaluated patient on 10/10/2020 after repeat MRI on 4/5 showed acute infarct of left MCA (initial MRI negative).  Concern of embolic pattern and recommended 30 cardiac event monitor outpatient to further evaluate for possible A. fib. Recommended starting plavix with hx of aspirin intolerance. Concern of cognitive impairment with MMSE 12/30 with lack of insight, disorientation and confabulation - B12 85 and initiated supplement. LDL 136 - started atorvastatin '40mg'$  daily.  Other stroke risk factors include tobacco use, migraines and questionable EtOH use. Evaluated by therapies and recommended CIR for ongoing therapy needs.   Stroke: Anterior Left MCA Infarct, embolic pattern, source unclear MRI brain left anterior MCA infarct MRA head and neck negative LE  venous Doppler negative Recommend 30 day cardioembolic monitoring 2D Echo : LVEF: 55 to 60% LDL 136 HgbA1c 5.6 UDS + methadone (home meds) VTE prophylaxis -  SCDs No antithrombotic prior to admission, now on clopidogrel 75 mg daily. Allergy to ASA. Therapy recommendations:  CIR recommended and accepted Disposition:  d/c to CIR on 4/7     ROS:   14 system review of systems performed and negative with exception of those listed in HPI    PMH:  Past Medical History:  Diagnosis Date   Cataracts, bilateral    Chickenpox cataracts   COPD (chronic obstructive pulmonary disease) ([place])    Depression    Drug addiction ([place])    Headache    Hepatitis C virus infection    Hepatitis C without hepatic coma    History of chicken pox    History of colon polyps    Seizures ([place])    Stroke ([place])     PSH:  Past Surgical History:  Procedure Laterality Date   APPENDECTOMY     CHOLECYSTECTOMY     COLONOSCOPY WITH PROPOFOL N/A 04/30/2015   Procedure: COLONOSCOPY WITH PROPOFOL;  Surgeon: Josefine Class, MD;  Location: [facility] ENDOSCOPY;  Service: Endoscopy;  Laterality: N/A;   COLONOSCOPY WITH PROPOFOL N/A 07/13/2017   Procedure: COLONOSCOPY WITH PROPOFOL;  Surgeon: Manya Silvas, MD;  Location: [facility] ENDOSCOPY;  Service: Endoscopy;  Laterality: N/A;   ESOPHAGOGASTRODUODENOSCOPY (EGD) WITH PROPOFOL N/A 07/13/2017   Procedure: ESOPHAGOGASTRODUODENOSCOPY (EGD) WITH PROPOFOL;  Surgeon: Manya Silvas, MD;  Location: [facility] ENDOSCOPY;  Service: Endoscopy;  Laterality: N/A;   EYE SURGERY      Social History:  Social History   Socioeconomic History   Marital status: Married    Spouse name: Not on file   Number of children: 2   Years of education: GED   Highest education level: Not on file  Occupational History   Not on file  Tobacco Use   Smoking status: Former    Packs/day: 0.50    Years: 41.00    Pack years: 20.50    Types: Cigarettes    Quit date: 10/27/2015    Years since  quitting: 5.3   Smokeless tobacco: Never  Vaping Use   Vaping Use: Never used  Substance and Sexual Activity   Alcohol use: No   Drug use: No   Sexual activity: Not Currently  Other Topics Concern   Not on file  Social History Narrative   03/14/21 son with her now, he lives 20 min away   significant other recently passed away   Social Determinants of Health   Financial Resource Strain: Not on file  Food Insecurity: Not on file  Transportation Needs: Not on file  Physical Activity: Not on file  Stress: Not on file  Social Connections: Not on file  Intimate Partner Violence: Not on file    Family History:  Family History  Problem Relation Age of Onset   Breast cancer Paternal Grandmother     Medications:   Current Outpatient Medications on File Prior to Visit  Medication Sig Dispense Refill   atorvastatin (LIPITOR) 40 MG tablet Take 1 tablet (40 mg total) by mouth daily. 30 tablet 0   clopidogrel (PLAVIX)  75 MG tablet Take 1 tablet (75 mg total) by mouth daily. 30 tablet 0   methadone (DOLOPHINE) 1 mg/ml oral solution Take 45 mg/kg by mouth daily.     No current facility-administered medications on file prior to visit.    Allergies:   Allergies  Allergen Reactions   Sulfasalazine Shortness Of Breath   Aspirin    Darvon [Propoxyphene]    Iodine    Penicillins    Sulfa Antibiotics    Zithromax [Azithromycin]       OBJECTIVE:  Physical Exam  Vitals:   03/14/21 1056 03/14/21 1100  BP: (!) 162/86 (!) 169/84  Pulse: (!) 59 62  Weight: 123 lb (55.8 kg)   Height: '5\' 4"'$  (1.626 m)     Body mass index is 21.11 kg/m. No results found.  General: Frail very pleasant older appearing than stated age 39 female, seated, in no evident distress Head: head normocephalic and atraumatic.   Neck: supple with no carotid or supraclavicular bruits Cardiovascular: regular rate and rhythm, no murmurs Musculoskeletal: no deformity Skin:  no rash/petichiae Vascular:   Normal pulses all extremities   Neurologic Exam Mental Status: Awake and fully alert.   Fluent speech and language. Recent memory mildly impaired and remote memory intact. Attention span, concentration and fund of knowledge appropriate.  Mood and affect appropriate.  MMSE - Mini Mental State Exam 03/14/2021 11/15/2020  Orientation to time 5 3  Orientation to Place 3 3  Registration 3 3  Attention/ Calculation 1 3  Recall 2 1  Language- name 2 objects 2 2  Language- repeat 1 1  Language- follow 3 step command 3 3  Language- read & follow direction 1 1  Write a sentence 1 1  Copy design 1 1  Total score 23 22   Cranial Nerves: Pupils equal, briskly reactive to light. Extraocular movements full without nystagmus. Visual fields full to confrontation. Hearing intact. Facial sensation intact. Face, tongue, palate moves normally and symmetrically.  Motor: Normal bulk and tone. Normal strength in all tested extremity muscles Sensory.: intact to touch , pinprick , position and vibratory sensation.  Coordination: Rapid alternating movements normal in all extremities. Finger-to-nose and heel-to-shin performed accurately bilaterally. Gait and Station: Arises from chair without difficulty. Stance is normal. Gait demonstrates normal stride length and balance without use of assistive device.  Difficulty performing tandem walk and heel toe Reflexes: 1+ and symmetric. Toes downgoing.         ASSESSMENT: Natalie [name] is a 65 y.o. year old female presented with altered mental status on 09/30/2020 with initial MRI negative. On 4/5, MRI showed acute infarct of L MCA infarct, embolic secondary to unknown source. Vascular risk factors include HTN, HLD, hx of tobacco use, B12 deficiency and migraines.     PLAN:  L MCA stroke :  Residual deficit: occasional word finding difficulty and mild cognitive impairment. Overall greatly recovered. Cognition currently better then prior to her stroke. Okay to slowly  return back to driving short distance on a graduated return to driving basis. Continue memory exercises as well as compensation strategies. Cardiac monitor negative for atrial fibrillation Continue clopidogrel 75 mg daily  and atorvastatin  for secondary stroke prevention.   Discussed secondary stroke prevention measures and importance of close PCP follow up for aggressive stroke risk factor management  HTN: BP goal <130/90.  Elevated today although stable at home routinely monitored by PCP HLD: LDL goal <70. Recent LDL 60 on atorvastatin 40 mg daily managed and  monitored by PCP B12 deficiency: B12 85 started supplement - repeat B12 4115 on 4/26 - supplement d/c'd by PCP.  B12 362 02/2021. Routine monitoring by PCP     Follow up in 6 months or call earlier if needed   CC:  [facility] provider: Dr. Leonie Man PCP: Sharyne Peach, MD    I spent 33 minutes of face-to-face and non-face-to-face time with patient and son.  This included previsit chart review, recent lab review since prior visit, discussion and education regarding prior stroke, residual deficits, completion and review of MMSE, education regarding secondary stroke prevention measures and answered all other questions to patient and sons satisfaction  Frann Rider, AGNP-BC  [facility] [address] [place], [place] [address]  Phone [phone] Fax [phone] Note: This document was prepared with digital dictation and possible smart phrase technology. Any transcriptional errors that result from this process are unintentional.

## 2021-03-19 NOTE — Progress Notes (Signed)
I agree with the above plan 

## 2021-04-05 ENCOUNTER — Other Ambulatory Visit: Payer: Self-pay | Admitting: Physical Medicine and Rehabilitation

## 2021-04-23 ENCOUNTER — Other Ambulatory Visit: Payer: Self-pay

## 2021-04-23 ENCOUNTER — Ambulatory Visit (INDEPENDENT_AMBULATORY_CARE_PROVIDER_SITE_OTHER): Payer: BC Managed Care – PPO | Admitting: Podiatry

## 2021-04-23 DIAGNOSIS — L989 Disorder of the skin and subcutaneous tissue, unspecified: Secondary | ICD-10-CM

## 2021-04-23 NOTE — Progress Notes (Signed)
   Subjective: 65 y.o. female presenting to the office today complaining of symptomatic calluses and skin lesions to bilateral feet.  Patient states that they have slowly developed over time and become very symptomatic.  She presents for further treatment evaluation   Past Medical History:  Diagnosis Date   Cataracts, bilateral    Chickenpox cataracts   COPD (chronic obstructive pulmonary disease) ([place])    Depression    Drug addiction ([place])    Headache    Hepatitis C virus infection    Hepatitis C without hepatic coma    History of chicken pox    History of colon polyps    Seizures ([place])    Stroke ([place])      Objective:  Physical Exam General: Alert and oriented x3 in no acute distress  Dermatology: Hyperkeratotic lesion(s) present on the weightbearing surfaces of the bilateral forefoot. Pain on palpation with a central nucleated core noted. Skin is warm, dry and supple bilateral lower extremities. Negative for open lesions or macerations.  Vascular: Palpable pedal pulses bilaterally. No edema or erythema noted. Capillary refill within normal limits.  Neurological: Epicritic and protective threshold grossly intact bilaterally.   Musculoskeletal Exam: Pain on palpation at the keratotic lesion(s) noted. Range of motion within normal limits bilateral. Muscle strength 5/5 in all groups bilateral.  Assessment: 1.  Porokeratosis/benign skin lesion bilateral forefoot   Plan of Care:  1. Patient evaluated 2. Excisional debridement of keratoic lesion(s) using a chisel blade was performed without incident.  3. Dressed area with light dressing. 4. Patient is to return to the clinic PRN.   Edrick Kins, DPM [facility]  Dr. Edrick Kins, DPM    [address] [place], [place] [address]                Office [phone]  Fax [phone]

## 2021-09-23 NOTE — Progress Notes (Signed)
?[facility] ?[address] ?[place]. [place] [address] ?[phone] ? ?     STROKE FOLLOW UP NOTE ? ?[name] ?Date of Birth:  June 02, 1956 ?Medical Record Number:  [id number]  ? ?Reason for Referral: stroke follow up ? ? ? ?SUBJECTIVE: ? ? ?CHIEF COMPLAINT:  ?Chief Complaint  ?Patient presents with  ? Follow-up  ?  RM 2 with son Natalie [name] ?Pt is well and stable, no new concerns   ? ? ? ?HPI:  ? ?Update 09/23/2021 JM: patient returns for 6 months follow-up accompanied by her son.  Overall stable since prior visit without new stroke/TIA symptoms.  Reports residual occasional word finding difficulty although greatly improved. Cognition also greatly improved and currently at baseline. Son lives with her for 1 week at a time and then his own place for 1 week and keeps that rotation. She maintains ADLs and majority of IADLs, son helps with bill paying and other assistance as needed. She does mention gradual decline of her vision (see A/P), plans on scheduling f/u visit with ophthalmologist soon.  Compliant on Plavix and atorvastatin, denies side effects.  Blood pressure today 126/77. Routinely monitors at home, typically stable. She has not had recent f/u with PCP.  No further concerns at this time. ? ? ?History provided for reference purposes only  ?Update 03/14/2021 JM: Natalie [name] returns for 58-monthstroke follow-up accompanied by her son.  Overall doing well.  Denies new stroke/TIA symptoms. Reports occasional word finding difficulty but overall improving and good with compensating.  MMSE today 23/30 (prior 22/30 - during stroke admission 12/30). She continues to maintain ADLs and majority of IADLs independently.  Her son does continue to live with her but plans on moving out soon.  She does question return to driving.  Compliant on Plavix and atorvastatin.  Blood pressure today 162/86 -similar on recheck. Routinely monitors at home and typically stable. No new concerns at this time ? ?Initial visit  11/15/2020 JM: Natalie [name] being seen for hospital follow-up accompanied by her son.  She was discharged home from CIR on 10/19/2020 after an 8-day stay.  Completed HH SLP on Monday with residual cognitive difficulties but overall recovering well and per son, he believes she has been doing better than her cognitive baseline prior to her stroke.  She continues to routinely do exercises as advised at home. Remains in her own home but her son has been staying with her.  She is able to maintain ADLs and majority of IADLs independently and son will assist as needed.  MMSE today 22/30 (12/30 during hospitalization).  She had greater difficulty with focusing and concentration as when we would move on to the next question such as place, she continuously tried to go back to time questions.  She has not yet returned back to driving.  Denies any functional deficits.  Denies new stroke/TIA symptoms ? ?Remains on Plavix and atorvastatin without associated side effects ?Blood pressure today 141/69 -does not routinely monitor at home is typically stable ?PCP recently recheck B12 level which was >4000 therefore supplement placed on hold and plans on repeating level in a couple of months ?Denies any current tobacco use (quit 6 years ago), EtOH or drug use ?Continued use of home oxygen 3L via [place] as needed ?Completed 30-day cardiac event monitor which did not show evidence of atrial fibrillation ? ?No further concerns at this time ? ?Stroke admission 09/30/2020 ?Ms. JSINAHI KNIGHTSis a 66y.o. female with history of  Cataracts, bilateral, Chickenpox (cataracts), COPD (chronic obstructive pulmonary disease) ([place]), Depression, Drug addiction ([place]), Headache, Hepatitis C virus infection, Hepatitis C without hepatic coma, History of chicken pox, History of colon polyps, and Seizures ([place]) who presented on 09/30/2020 with altered mental status on day of admission.  Personally reviewed hospitalization pertinent progress notes, lab work and imaging  with summary provided.  Stroke team evaluated patient on 10/10/2020 after repeat MRI on 4/5 showed acute infarct of left MCA (initial MRI negative).  Concern of embolic pattern and recommended 30 cardiac event monitor outpatient to further evaluate for possible A. fib. Recommended starting plavix with hx of aspirin intolerance. Concern of cognitive impairment with MMSE 12/30 with lack of insight, disorientation and confabulation - B12 85 and initiated supplement. LDL 136 - started atorvastatin '40mg'$  daily. Other stroke risk factors include tobacco use, migraines and questionable EtOH use. Evaluated by therapies and recommended CIR for ongoing therapy needs.  ? ?Stroke: Anterior Left MCA Infarct, embolic pattern, source unclear ?MRI brain left anterior MCA infarct ?MRA head and neck negative ?LE venous Doppler negative ?Recommend 30 day cardioembolic monitoring ?2D Echo : LVEF: 55 to 60% ?LDL 136 ?HgbA1c 5.6 ?UDS + methadone (home meds) ?VTE prophylaxis -  SCDs ?No antithrombotic prior to admission, now on clopidogrel 75 mg daily. Allergy to ASA. ?Therapy recommendations:  CIR recommended and accepted ?Disposition:  d/c to CIR on 4/7 ? ? ? ? ?ROS:   ?14 system review of systems performed and negative with exception of those listed in HPI ? ? ? ?PMH:  ?Past Medical History:  ?Diagnosis Date  ? Cataracts, bilateral   ? Chickenpox cataracts  ? COPD (chronic obstructive pulmonary disease) ([place])   ? Depression   ? Drug addiction ([place])   ? Headache   ? Hepatitis C virus infection   ? Hepatitis C without hepatic coma   ? History of chicken pox   ? History of colon polyps   ? Seizures ([place])   ? Stroke [facility])   ? ? ?PSH:  ?Past Surgical History:  ?Procedure Laterality Date  ? APPENDECTOMY    ? CHOLECYSTECTOMY    ? COLONOSCOPY WITH PROPOFOL N/A 04/30/2015  ? Procedure: COLONOSCOPY WITH PROPOFOL;  Surgeon: Josefine Class, MD;  Location[facility] ENDOSCOPY;  Service: Endoscopy;  Laterality: N/A;  ? COLONOSCOPY WITH PROPOFOL N/A  07/13/2017  ? Procedure: COLONOSCOPY WITH PROPOFOL;  Surgeon: Manya Silvas, MD;  Location: [facility] ENDOSCOPY;  Service: Endoscopy;  Laterality: N/A;  ? ESOPHAGOGASTRODUODENOSCOPY (EGD) WITH PROPOFOL N/A 07/13/2017  ? Procedure: ESOPHAGOGASTRODUODENOSCOPY (EGD) WITH PROPOFOL;  Surgeon: Manya Silvas, MD;  Location: [facility] ENDOSCOPY;  Service: Endoscopy;  Laterality: N/A;  ? EYE SURGERY    ? ? ?Social History:  ?Social History  ? ?Socioeconomic History  ? Marital status: Married  ?  Spouse name: Not on file  ? Number of children: 2  ? Years of education: GED  ? Highest education level: Not on file  ?Occupational History  ? Not on file  ?Tobacco Use  ? Smoking status: Former  ?  Packs/day: 0.50  ?  Years: 41.00  ?  Pack years: 20.50  ?  Types: Cigarettes  ?  Quit date: 10/27/2015  ?  Years since quitting: 5.9  ? Smokeless tobacco: Never  ?Vaping Use  ? Vaping Use: Never used  ?Substance and Sexual Activity  ? Alcohol use: No  ? Drug use: No  ? Sexual activity: Not Currently  ?Other Topics Concern  ? Not on file  ?  Social History Narrative  ? 03/14/21 son with her now, he lives 20 min away  ? significant other recently passed away  ? ?Social Determinants of Health  ? ?Financial Resource Strain: Not on file  ?Food Insecurity: Not on file  ?Transportation Needs: Not on file  ?Physical Activity: Not on file  ?Stress: Not on file  ?Social Connections: Not on file  ?Intimate Partner Violence: Not on file  ? ? ?Family History:  ?Family History  ?Problem Relation Age of Onset  ? Breast cancer Paternal Grandmother   ? ? ?Medications:   ?Current Outpatient Medications on File Prior to Visit  ?Medication Sig Dispense Refill  ? amLODipine (NORVASC) 5 MG tablet Take 5 mg by mouth daily.    ? atorvastatin (LIPITOR) 40 MG tablet Take 1 tablet (40 mg total) by mouth daily. 30 tablet 0  ? clopidogrel (PLAVIX) 75 MG tablet Take 1 tablet (75 mg total) by mouth daily. 30 tablet 0  ? methadone (DOLOPHINE) 1 mg/ml oral solution Take 45 mg/kg by  mouth daily.    ? ?No current facility-administered medications on file prior to visit.  ? ? ?Allergies:   ?Allergies  ?Allergen Reactions  ? Sulfasalazine Shortness Of Breath  ? Aspirin   ? Darvon [Pro

## 2021-09-24 ENCOUNTER — Ambulatory Visit (INDEPENDENT_AMBULATORY_CARE_PROVIDER_SITE_OTHER): Payer: BC Managed Care – PPO | Admitting: Adult Health

## 2021-09-24 ENCOUNTER — Encounter: Payer: Self-pay | Admitting: Adult Health

## 2021-09-24 VITALS — BP 126/77 | HR 74 | Ht 65.0 in | Wt 135.0 lb

## 2021-09-24 DIAGNOSIS — E538 Deficiency of other specified B group vitamins: Secondary | ICD-10-CM

## 2021-09-24 DIAGNOSIS — E785 Hyperlipidemia, unspecified: Secondary | ICD-10-CM | POA: Diagnosis not present

## 2021-09-24 DIAGNOSIS — H53462 Homonymous bilateral field defects, left side: Secondary | ICD-10-CM

## 2021-09-24 DIAGNOSIS — I63412 Cerebral infarction due to embolism of left middle cerebral artery: Secondary | ICD-10-CM | POA: Diagnosis not present

## 2021-09-24 NOTE — Patient Instructions (Addendum)
You will be called to repeat an MRI of your brain - we will call you once we have the results and able to further review them ? ?Continue clopidogrel 75 mg daily  and atorvastatin  for secondary stroke prevention ? ?We will check your cholesterol and B12 levels today ? ?Continue to follow up with PCP regarding cholesterol and blood pressure management  ?Maintain strict control of hypertension with blood pressure goal below 130/90, below 7.0 % and cholesterol with LDL cholesterol (bad cholesterol) goal below 70 mg/dL.  ? ?Signs of a Stroke? Follow the BEFAST method:  ?Balance Watch for a sudden loss of balance, trouble with coordination or vertigo ?Eyes Is there a sudden loss of vision in one or both eyes? Or double vision?  ?Face: Ask the person to smile. Does one side of the face droop or is it numb?  ?Arms: Ask the person to raise both arms. Does one arm drift downward? Is there weakness or numbness of a leg? ?Speech: Ask the person to repeat a simple phrase. Does the speech sound slurred/strange? Is the person confused ? ?Time: If you observe any of these signs, call 911. ? ? ? ? ? ?Follow up in 6 months or call earlier if needed ? ? ? ? ? ? ?Thank you for coming to see [place] at [facility]. I hope we have been able to provide you high quality care today. ? ?You may receive a patient satisfaction survey over the next few weeks. We would appreciate your feedback and comments so that we may continue to improve ourselves and the health of our patients. ? ?

## 2021-09-25 LAB — COMPREHENSIVE METABOLIC PANEL
ALT: 18 IU/L (ref 0–32)
AST: 21 IU/L (ref 0–40)
Albumin/Globulin Ratio: 2.1 (ref 1.2–2.2)
Albumin: 4.8 g/dL (ref 3.8–4.8)
Alkaline Phosphatase: 145 IU/L — ABNORMAL HIGH (ref 44–121)
BUN/Creatinine Ratio: 14 (ref 12–28)
BUN: 17 mg/dL (ref 8–27)
Bilirubin Total: 1.5 mg/dL — ABNORMAL HIGH (ref 0.0–1.2)
CO2: 26 mmol/L (ref 20–29)
Calcium: 9.7 mg/dL (ref 8.7–10.3)
Chloride: 101 mmol/L (ref 96–106)
Creatinine, Ser: 1.25 mg/dL — ABNORMAL HIGH (ref 0.57–1.00)
Globulin, Total: 2.3 g/dL (ref 1.5–4.5)
Glucose: 86 mg/dL (ref 70–99)
Potassium: 4.4 mmol/L (ref 3.5–5.2)
Sodium: 142 mmol/L (ref 134–144)
Total Protein: 7.1 g/dL (ref 6.0–8.5)
eGFR: 48 mL/min/{1.73_m2} — ABNORMAL LOW (ref >59)

## 2021-09-25 LAB — CBC
Hematocrit: 43.2 % (ref 34.0–46.6)
Hemoglobin: 14.9 g/dL (ref 11.1–15.9)
MCH: 32 pg (ref 26.6–33.0)
MCHC: 34.5 g/dL (ref 31.5–35.7)
MCV: 93 fL (ref 79–97)
Platelets: 220 10*3/uL (ref 150–450)
RBC: 4.66 x10E6/uL (ref 3.77–5.28)
RDW: 13.2 % (ref 11.7–15.4)
WBC: 5.8 10*3/uL (ref 3.4–10.8)

## 2021-09-25 LAB — VITAMIN B12: Vitamin B-12: 356 pg/mL (ref 232–1245)

## 2021-09-25 LAB — LIPID PANEL
Chol/HDL Ratio: 1.8 ratio (ref 0.0–4.4)
Cholesterol, Total: 160 mg/dL (ref 100–199)
HDL: 89 mg/dL (ref >39)
LDL Chol Calc (NIH): 60 mg/dL (ref 0–99)
Triglycerides: 52 mg/dL (ref 0–149)
VLDL Cholesterol Cal: 11 mg/dL (ref 5–40)

## 2021-10-03 ENCOUNTER — Telehealth: Payer: Self-pay | Admitting: Adult Health

## 2021-10-03 NOTE — Telephone Encounter (Signed)
BCBS auth: NPR spoke to carlisa ref # [id number]  & medicaid order sent to GI, they will reach out to the patient to schedule.  ?

## 2021-10-08 ENCOUNTER — Other Ambulatory Visit: Payer: Self-pay | Admitting: Podiatry

## 2021-10-08 ENCOUNTER — Encounter: Payer: Self-pay | Admitting: Podiatry

## 2021-10-08 ENCOUNTER — Ambulatory Visit (INDEPENDENT_AMBULATORY_CARE_PROVIDER_SITE_OTHER): Payer: BC Managed Care – PPO

## 2021-10-08 ENCOUNTER — Ambulatory Visit (INDEPENDENT_AMBULATORY_CARE_PROVIDER_SITE_OTHER): Payer: BC Managed Care – PPO | Admitting: Podiatry

## 2021-10-08 DIAGNOSIS — L989 Disorder of the skin and subcutaneous tissue, unspecified: Secondary | ICD-10-CM

## 2021-10-08 DIAGNOSIS — R600 Localized edema: Secondary | ICD-10-CM | POA: Diagnosis not present

## 2021-10-08 DIAGNOSIS — M2041 Other hammer toe(s) (acquired), right foot: Secondary | ICD-10-CM

## 2021-10-08 NOTE — Progress Notes (Signed)
? ?  Subjective: ?66 y.o. female presenting to the office today for new complaint regarding pain and tenderness to the right lower extremity.  Patient states that just around the middle of the leg down to the foot she experiences pain when she walks.  She denies a history of injury.  She does state that throughout the day she gets some swelling as well.  She does have a history of right forefoot surgery.   ? ?Past Medical History:  ?Diagnosis Date  ? Cataracts, bilateral   ? Chickenpox cataracts  ? COPD (chronic obstructive pulmonary disease) ([place])   ? Depression   ? Drug addiction ([place])   ? Headache   ? Hepatitis C virus infection   ? Hepatitis C without hepatic coma   ? History of chicken pox   ? History of colon polyps   ? Seizures ([place])   ? Stroke [facility])   ? ? ? ?Objective:  ?Physical Exam ?General: Alert and oriented x3 in no acute distress ? ?Dermatology: Hyperkeratotic lesion(s) present on the weightbearing surfaces of the bilateral forefoot. Pain on palpation with a central nucleated core noted. Skin is warm, dry and supple bilateral lower extremities. Negative for open lesions or macerations. ? ?Vascular: Palpable pedal pulses bilaterally.  Currently there is no edema but the patient states that she does have some swelling to the right lower extremity about the level of the mid leg which becomes painful throughout the day when she walks on it.  Capillary refill within normal limits. ? ?Neurological: Epicritic and protective threshold grossly intact bilaterally.  ? ?Musculoskeletal Exam: Pain on palpation at the keratotic lesion(s) noted. Range of motion within normal limits bilateral. Muscle strength 5/5 in all groups bilateral. ? ?Assessment: ?1.  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  Right lower extremity pain with edema ? ? ?Plan of Care:  ?1. Patient evaluated ?2. Excisional debridement of keratoic lesion(s) using a chisel blade was performed without incident.   ?3.  Compression ankle sleeve dispensed.  Wear daily ?4. Patient is to return to the clinic PRN.  ? ?Edrick Kins, DPM ?[place] ? ?Dr. Edrick Kins, DPM  ?  ?[address]                                ?[place], [place] [address]                ?Office [phone]  ?Fax [phone] ? ? ? ? ?

## 2021-10-11 ENCOUNTER — Ambulatory Visit
Admission: RE | Admit: 2021-10-11 | Discharge: 2021-10-11 | Disposition: A | Discharge status: Home / Self Care | Payer: BC Managed Care – PPO | Source: Ambulatory Visit | Attending: Adult Health | Admitting: Adult Health

## 2021-11-01 ENCOUNTER — Telehealth: Payer: Self-pay | Admitting: Adult Health

## 2021-11-01 NOTE — Telephone Encounter (Signed)
Pt calling concerning getting her MRI results back.  ?Pt states her and family are concerned regarding pt memory worsening since her stroke. ?She would like to know if she needs and earlier appt.  ?

## 2021-11-04 NOTE — Telephone Encounter (Signed)
Results were sent to her MyChart - repeat imaging did show her old strokes but no new or worrisome findings. Did recommend follow up with ophthalmology regarding visual concern.  Her memory was initially worsened post stroke but at recent visit, both son and patient reported her memory had greatly improved and currently at baseline cognition. If she is concerned that memory has declined since prior visit, can schedule a sooner follow up visit if needed.  ?

## 2021-11-04 NOTE — Telephone Encounter (Signed)
Called patient and reviewed NP's message. She would like sooner FU, scheduled for soonest afternoon due to her transpo needs, placed her on wait list. She did state she doesn't need to be seen again right away. Patient verbalized understanding, appreciation. ? ?

## 2021-11-11 ENCOUNTER — Ambulatory Visit: Payer: BC Managed Care – PPO | Admitting: Podiatry

## 2021-11-12 ENCOUNTER — Other Ambulatory Visit: Payer: Self-pay | Admitting: Specialist

## 2021-11-12 ENCOUNTER — Ambulatory Visit (INDEPENDENT_AMBULATORY_CARE_PROVIDER_SITE_OTHER): Payer: BC Managed Care – PPO | Admitting: Podiatry

## 2021-11-12 ENCOUNTER — Encounter: Payer: Self-pay | Admitting: Podiatry

## 2021-11-12 ENCOUNTER — Ambulatory Visit
Admission: RE | Admit: 2021-11-12 | Discharge: 2021-11-12 | Disposition: A | Discharge status: Home / Self Care | Payer: BC Managed Care – PPO | Source: Ambulatory Visit | Attending: Specialist | Admitting: Specialist

## 2021-11-12 DIAGNOSIS — R609 Edema, unspecified: Secondary | ICD-10-CM

## 2021-11-12 DIAGNOSIS — R600 Localized edema: Secondary | ICD-10-CM

## 2021-11-12 NOTE — Progress Notes (Signed)
? ?HPI: 66 y.o. female presenting today for increased pain and swelling to the right lower extremity.  Patient states that her pulmonologist ordered venous Doppler of the right lower extremity due to concern of possible DVT.  She continues to have swelling and redness to the leg with associated pain.  She presents for further treatment and evaluation and to review the ultrasound results ? ?Past Medical History:  ?Diagnosis Date  ? Cataracts, bilateral   ? Chickenpox cataracts  ? COPD (chronic obstructive pulmonary disease) ([place])   ? Depression   ? Drug addiction ([place])   ? Headache   ? Hepatitis C virus infection   ? Hepatitis C without hepatic coma   ? History of chicken pox   ? History of colon polyps   ? Seizures ([place])   ? Stroke [facility])   ? ? ?Past Surgical History:  ?Procedure Laterality Date  ? APPENDECTOMY    ? CHOLECYSTECTOMY    ? COLONOSCOPY WITH PROPOFOL N/A 04/30/2015  ? Procedure: COLONOSCOPY WITH PROPOFOL;  Surgeon: Josefine Class, MD;  Location: [facility] ENDOSCOPY;  Service: Endoscopy;  Laterality: N/A;  ? COLONOSCOPY WITH PROPOFOL N/A 07/13/2017  ? Procedure: COLONOSCOPY WITH PROPOFOL;  Surgeon: Manya Silvas, MD;  Location: [facility] ENDOSCOPY;  Service: Endoscopy;  Laterality: N/A;  ? ESOPHAGOGASTRODUODENOSCOPY (EGD) WITH PROPOFOL N/A 07/13/2017  ? Procedure: ESOPHAGOGASTRODUODENOSCOPY (EGD) WITH PROPOFOL;  Surgeon: Manya Silvas, MD;  Location: [facility] ENDOSCOPY;  Service: Endoscopy;  Laterality: N/A;  ? EYE SURGERY    ? ? ?Allergies  ?Allergen Reactions  ? Sulfasalazine Shortness Of Breath  ? Aspirin   ? Darvon [Propoxyphene]   ? Iodine   ? Penicillins   ? Sulfa Antibiotics   ? Zithromax [Azithromycin]   ? ?  ?Physical Exam: ?General: The patient is alert and oriented x3 in no acute distress. ? ?Dermatology: Skin is warm, dry and supple bilateral lower extremities. Negative for open lesions or macerations. ? ?Vascular: Palpable pedal pulses bilaterally. Capillary refill within normal limits.  Edema  noted right lower extremity up to the level of the knee.  Increased erythema as well ? ?US VENOUS IMG RLE (DVT) 11/12/2021 ?FINDINGS VENOUS ?Normal compressibility of the common femoral, superficial femoral, ?and popliteal veins, as well as the visualized calf veins. ?Visualized portions of profunda femoral vein and great saphenous ?vein unremarkable. No filling defects to suggest DVT on grayscale or ?color Doppler imaging. Doppler waveforms show normal direction of ?venous flow, normal respiratory plasticity and response to ?augmentation. ?Limited views of the contralateral common femoral vein are ?unremarkable. ?  ?Other: Approximately 2.0 x 0.7 x 1.4 cm cyst within the right ?popliteal fossa, probably a Baker's cyst. Right calf edema. ?  ?IMPRESSION: ?1. No evidence of DVT in the right lower extremity. ?2. Probable 2.0 cm Baker's cyst. ?3. Calf edema. ? ?Neurological: Light touch and protective threshold grossly intact ? ?Musculoskeletal Exam: No pedal deformities noted.  There is associated tenderness to palpation of the right leg and calf ? ?Assessment: ?1.  Edema RLE with associated tenderness ? ? ?Plan of Care:  ?1. Patient evaluated.  Venous Doppler ultrasound reviewed.  Negative for DVT ?2.  Recommend follow-up with orthopedist for right knee pain ?3.  Recommend knee-high compression socks daily ?4.  Also recommend follow-up with PCP ?5.  Return to clinic as needed ? ?  ?  ?Edrick Kins, DPM ?[place] ? ?Dr. Edrick Kins, DPM  ?  ?[address]                                        ?  [place], [place] [address]                ?Office [phone]  ?Fax [phone] ? ? ? ? ?

## 2021-11-26 ENCOUNTER — Ambulatory Visit (INDEPENDENT_AMBULATORY_CARE_PROVIDER_SITE_OTHER): Payer: BC Managed Care – PPO | Admitting: Vascular Surgery

## 2021-11-26 ENCOUNTER — Encounter (INDEPENDENT_AMBULATORY_CARE_PROVIDER_SITE_OTHER): Payer: Self-pay | Admitting: Vascular Surgery

## 2021-11-26 VITALS — BP 127/74 | HR 76 | Resp 15 | Ht 65.5 in | Wt 135.0 lb

## 2021-11-26 DIAGNOSIS — M7989 Other specified soft tissue disorders: Secondary | ICD-10-CM

## 2021-11-26 DIAGNOSIS — N1831 Chronic kidney disease, stage 3a: Secondary | ICD-10-CM | POA: Diagnosis not present

## 2021-11-26 DIAGNOSIS — J449 Chronic obstructive pulmonary disease, unspecified: Secondary | ICD-10-CM

## 2021-11-26 NOTE — Patient Instructions (Signed)
Edema  Edema is an abnormal buildup of fluids in the body tissues and under the skin. Swelling of the legs, feet, and ankles is a common symptom that becomes more likely as you get older. Swelling is also common in looser tissues, such as around the eyes. Pressing on the area may make a temporary dent in your skin (pitting edema). This fluid may also accumulate in your lungs (pulmonary edema). There are many possible causes of edema. Eating too much salt (sodium) and being on your feet or sitting for a long time can cause edema in your legs, feet, and ankles. Common causes of edema include: Certain medical conditions, such as heart failure, liver or kidney disease, and cancer. Weak leg blood vessels. An injury. Pregnancy. Medicines. Being obese. Low protein levels in the blood. Hot weather may make edema worse. Edema is usually painless. Your skin may look swollen or shiny. Follow these instructions at home: Medicines Take over-the-counter and prescription medicines only as told by your health care provider. Your health care provider may prescribe a medicine to help your body get rid of extra water (diuretic). Take this medicine if you are told to take it. Eating and drinking Eat a low-salt (low-sodium) diet to reduce fluid as told by your health care provider. Sometimes, eating less salt may reduce swelling. Depending on the cause of your swelling, you may need to limit how much fluid you drink (fluid restriction). General instructions Raise (elevate) the injured area above the level of your heart while you are sitting or lying down. Do not sit still or stand for long periods of time. Do not wear tight clothing. Do not wear garters on your upper legs. Exercise your legs to get your circulation going. This helps to move the fluid back into your blood vessels, and it may help the swelling go down. Wear compression stockings as told by your health care provider. These stockings help to prevent  blood clots and reduce swelling in your legs. It is important that these are the correct size. These stockings should be prescribed by your health care provider to prevent possible injuries. If elastic bandages or wraps are recommended, use them as told by your health care provider. Contact a health care provider if: Your edema does not get better with treatment. You have heart, liver, or kidney disease and have symptoms of edema. You have sudden and unexplained weight gain. Get help right away if: You develop shortness of breath or chest pain. You cannot breathe when you lie down. You develop pain, redness, or warmth in the swollen areas. You have heart, liver, or kidney disease and suddenly get edema. You have a fever and your symptoms suddenly get worse. These symptoms may be an emergency. Get help right away. Call 911. Do not wait to see if the symptoms will go away. Do not drive yourself to the hospital. Summary Edema is an abnormal buildup of fluids in the body tissues and under the skin. Eating too much salt (sodium)and being on your feet or sitting for a long time can cause edema in your legs, feet, and ankles. Raise (elevate) the injured area above the level of your heart while you are sitting or lying down. Follow your health care provider's instructions about diet and how much fluid you can drink. This information is not intended to replace advice given to you by your health care provider. Make sure you discuss any questions you have with your health care provider. Document Revised: 02/25/2021 Document   Reviewed: 02/25/2021 Elsevier Patient Education  2023 Elsevier Inc.  

## 2021-11-26 NOTE — Assessment & Plan Note (Signed)
On oxygen.  Significant cardiopulmonary disease can certainly worsen lower extremity swelling.

## 2021-11-26 NOTE — Assessment & Plan Note (Signed)

## 2021-11-26 NOTE — Progress Notes (Signed)
Patient ID: Natalie [name], female   DOB: 18-Apr-1956, 66 y.o.   MRN: [id number]  Chief Complaint  Patient presents with   New Patient (Initial Visit)    Ref  consult bilateral le edema    HPI Natalie [name] is a 66 y.o. female.  I am asked to see the patient by Vance Peper for evaluation of lower extremity swelling.  The patient reports having a very significant fall particularly on her right leg a few months ago and has noticed marked swelling particularly on the right side since that time.  Family members with her today said that she was probably having some swelling before then although she had really noticed it.  She has oxygen dependent lung disease and her mobility is certainly less than it used to be.  Sitting with her legs dependent for long periods of time make the swelling much worse.  No open wounds or infection.  No fevers or chills.  No previous history of DVT or superficial thrombophlebitis to her knowledge.  She has tried compression socks over the past several weeks with fairly good success in controlling the swelling, but she says as soon as she takes these off the swelling comes back and is more significant.  The right leg is more severely affected than the left.  She is starting to notice more swelling in the left as well.     Past Medical History:  Diagnosis Date   Cataracts, bilateral    Chickenpox cataracts   COPD (chronic obstructive pulmonary disease) ([place])    Depression    Drug addiction ([place])    Headache    Hepatitis C virus infection    Hepatitis C without hepatic coma    History of chicken pox    History of colon polyps    Seizures ([place])    Stroke [facility])     Past Surgical History:  Procedure Laterality Date   APPENDECTOMY     CHOLECYSTECTOMY     COLONOSCOPY WITH PROPOFOL N/A 04/30/2015   Procedure: COLONOSCOPY WITH PROPOFOL;  Surgeon: Josefine Class, MD;  Location: [facility] ENDOSCOPY;  Service: Endoscopy;  Laterality: N/A;   COLONOSCOPY WITH PROPOFOL N/A  07/13/2017   Procedure: COLONOSCOPY WITH PROPOFOL;  Surgeon: Manya Silvas, MD;  Location: [facility] ENDOSCOPY;  Service: Endoscopy;  Laterality: N/A;   ESOPHAGOGASTRODUODENOSCOPY (EGD) WITH PROPOFOL N/A 07/13/2017   Procedure: ESOPHAGOGASTRODUODENOSCOPY (EGD) WITH PROPOFOL;  Surgeon: Manya Silvas, MD;  Location: [facility] ENDOSCOPY;  Service: Endoscopy;  Laterality: N/A;   EYE SURGERY       Family History  Problem Relation Age of Onset   Breast cancer Paternal Grandmother   No bleeding disorders, clotting disorders, autoimmune diseases, or aneurysms   Social History   Tobacco Use   Smoking status: Former    Packs/day: 0.50    Years: 41.00    Pack years: 20.50    Types: Cigarettes    Quit date: 10/27/2015    Years since quitting: 6.0   Smokeless tobacco: Never  Vaping Use   Vaping Use: Never used  Substance Use Topics   Alcohol use: No   Drug use: No     Allergies  Allergen Reactions   Sulfasalazine Shortness Of Breath   Aspirin    Darvon [Propoxyphene]    Iodine    Penicillins    Sulfa Antibiotics    Zithromax [Azithromycin]     Current Outpatient Medications  Medication Sig Dispense Refill   amLODipine (NORVASC) 5 MG tablet  Take 5 mg by mouth daily.     atorvastatin (LIPITOR) 40 MG tablet Take 1 tablet (40 mg total) by mouth daily. 30 tablet 0   clopidogrel (PLAVIX) 75 MG tablet Take 1 tablet (75 mg total) by mouth daily. 30 tablet 0   methadone (DOLOPHINE) 1 mg/ml oral solution Take 45 mg/kg by mouth daily.     No current facility-administered medications for this visit.      REVIEW OF SYSTEMS (Negative unless checked)  Constitutional: _0 Weight loss  _1 Fever  _2 Chills Cardiac: _3 Chest pain   _4 Chest pressure   _5 Palpitations   _6 Shortness of breath when laying flat   _7 Shortness of breath at rest   _8 Shortness of breath with exertion. Vascular:  _9 Pain in legs with walking   _10 Pain in legs at rest   _11 Pain in legs when laying flat   _12 Claudication   _13 Pain in  feet when walking  _14 Pain in feet at rest  _15 Pain in feet when laying flat   _16 History of DVT   _17 Phlebitis   _18 Swelling in legs   _19 Varicose veins   _20 Non-healing ulcers Pulmonary:   _21 Uses home oxygen   _22 Productive cough   _23 Hemoptysis   _24 Wheeze  _25 COPD   _26 Asthma Neurologic:  _27 Dizziness  _28 Blackouts   _29 Seizures   _30 History of stroke   _31 History of TIA  _32 Aphasia   _33 Temporary blindness   _34 Dysphagia   _35 Weakness or numbness in arms   _36 Weakness or numbness in legs Musculoskeletal:  _37 Arthritis   _38 Joint swelling   _39 Joint pain   _40 Low back pain Hematologic:  _41 Easy bruising  _42 Easy bleeding   _43 Hypercoagulable state   _44 Anemic  _45 Hepatitis Gastrointestinal:  _46 Blood in stool   _47 Vomiting blood  _48 Gastroesophageal reflux/heartburn   _49 Abdominal pain Genitourinary:  _50 Chronic kidney disease   _51 Difficult urination  _52 Frequent urination  _53 Burning with urination   _54 Hematuria Skin:  _55 Rashes   _56 Ulcers   _57 Wounds Psychological:  _58 History of anxiety   _59  History of major depression.    Physical Exam BP 127/74 (BP Location: Left Arm)   Pulse 76   Resp 15   Ht 5' 5.5" (1.664 m)   Wt 135 lb (61.2 kg)   BMI 22.12 kg/m  Gen:  WD/WN, NAD.  Appears older than stated age Head: [place]/AT, No temporalis wasting.  Ear/Nose/Throat: Hearing grossly intact, nares w/o erythema or drainage, oropharynx w/o Erythema/Exudate Eyes: Conjunctiva clear, sclera non-icteric  Neck: trachea midline.  No JVD.  Pulmonary:  Good air movement, respirations not labored on supplemental oxygen Cardiac: Irregular Vascular:  Vessel Right Left  Radial Palpable Palpable                          DP Trace 1+  PT Not palpable 1+   Gastrointestinal:. No masses, surgical incisions, or scars. Musculoskeletal: M/S 5/5 throughout.  Extremities without ischemic changes.  No deformity or atrophy.  2+ right lower extremity edema, 1+ left lower extremity edema. Neurologic: Sensation grossly intact in extremities.   Symmetrical.  Speech is fluent. Motor exam as listed above. Psychiatric: Judgment intact, Mood & affect appropriate for pt's clinical situation. Dermatologic: No rashes or ulcers noted.  No cellulitis or open wounds.    Radiology US Venous Img Lower Unilateral Right (DVT)  Result Date: 11/12/2021 CLINICAL DATA:  Edema, unspecified type EXAM: RIGHT LOWER EXTREMITY VENOUS DOPPLER ULTRASOUND TECHNIQUE: Gray-scale sonography with compression, as well as color and duplex ultrasound, were performed to evaluate the deep venous system(s) from the level  of the common femoral vein through the popliteal and proximal calf veins. COMPARISON:  None Available. FINDINGS: VENOUS Normal compressibility of the common femoral, superficial femoral, and popliteal veins, as well as the visualized calf veins. Visualized portions of profunda femoral vein and great saphenous vein unremarkable. No filling defects to suggest DVT on grayscale or color Doppler imaging. Doppler waveforms show normal direction of venous flow, normal respiratory plasticity and response to augmentation. Limited views of the contralateral common femoral vein are unremarkable. Other: Approximately 2.0 x 0.7 x 1.4 cm cyst within the right popliteal fossa, probably a Baker's cyst. Right calf edema. IMPRESSION: 1. No evidence of DVT in the right lower extremity. 2. Probable 2.0 cm Baker's cyst. 3. Calf edema. Electronically Signed   By: Margaretha Sheffield M.D.   On: 11/12/2021 14:32    Labs Recent Results (from the past 2160 hour(s))  Lipid Panel     Status: None   Collection Time: 09/24/21 11:06 AM  Result Value Ref Range   Cholesterol, Total 160 100 - 199 mg/dL   Triglycerides 52 0 - 149 mg/dL   HDL 89 >39 mg/dL   VLDL Cholesterol Cal 11 5 - 40 mg/dL   LDL Chol Calc (NIH) 60 0 - 99 mg/dL   Chol/HDL Ratio 1.8 0.0 - 4.4 ratio    Comment:                                   T. Chol/HDL Ratio                                             Men  Women                                1/2 Avg.Risk  3.4    3.3                                   Avg.Risk  5.0    4.4                                2X Avg.Risk  9.6    7.1                                3X Avg.Risk 23.4   11.0   Vitamin B12     Status: None   Collection Time: 09/24/21 11:06 AM  Result Value Ref Range   Vitamin B-12 356 232 - 1,245 pg/mL  CMP     Status: Abnormal   Collection Time: 09/24/21 11:06 AM  Result Value Ref Range   Glucose 86 70 - 99 mg/dL   BUN 17 8 - 27 mg/dL   Creatinine, Ser 1.25 (H) 0.57 - 1.00 mg/dL   eGFR 48 (L) >59 mL/min/1.73   BUN/Creatinine Ratio 14 12 - 28   Sodium 142 134 - 144 mmol/L   Potassium 4.4 3.5 - 5.2 mmol/L   Chloride 101 96 - 106 mmol/L   CO2 26 20 - 29 mmol/L   Calcium 9.7 8.7 - 10.3 mg/dL  Total Protein 7.1 6.0 - 8.5 g/dL   Albumin 4.8 3.8 - 4.8 g/dL   Globulin, Total 2.3 1.5 - 4.5 g/dL   Albumin/Globulin Ratio 2.1 1.2 - 2.2   Bilirubin Total 1.5 (H) 0.0 - 1.2 mg/dL   Alkaline Phosphatase 145 (H) 44 - 121 IU/L   AST 21 0 - 40 IU/L   ALT 18 0 - 32 IU/L  CBC (no diff)     Status: None   Collection Time: 09/24/21 11:06 AM  Result Value Ref Range   WBC 5.8 3.4 - 10.8 x10E3/uL   RBC 4.66 3.77 - 5.28 x10E6/uL   Hemoglobin 14.9 11.1 - 15.9 g/dL   Hematocrit 43.2 34.0 - 46.6 %   MCV 93 79 - 97 fL   MCH 32.0 26.6 - 33.0 pg   MCHC 34.5 31.5 - 35.7 g/dL   RDW 13.2 11.7 - 15.4 %   Platelets 220 150 - 450 x10E3/uL    Assessment/Plan:  Swelling of limb Recommend:  I have had a long discussion with the patient regarding swelling and why it  causes symptoms.  Patient will begin wearing graduated compression on a daily basis a prescription was given. The patient will  wear the stockings first thing in the morning and removing them in the evening. The patient is instructed specifically not to sleep in the stockings.   In addition, behavioral modification will be initiated.  This will include frequent elevation, use of over the counter pain  medications and exercise such as walking.  Consideration for a lymph pump will also be made based upon the effectiveness of conservative therapy.  This would help to improve the edema control and prevent sequela such as ulcers and infections   Patient should undergo duplex ultrasound of the venous system to ensure that DVT or reflux is not present.  The patient will follow-up with me after the ultrasound.    CKD (chronic kidney disease) stage 3, GFR 30-59 ml/min (HCC) Worsens LE edema.  COPD (chronic obstructive pulmonary disease) (HCC) On oxygen.  Significant cardiopulmonary disease can certainly worsen lower extremity swelling.      Leotis Pain 11/26/2021, 2:40 PM   This note was created with Dragon medical transcription system.  Any errors from dictation are unintentional.

## 2021-11-26 NOTE — Assessment & Plan Note (Signed)
Worsens LE edema.

## 2021-12-04 ENCOUNTER — Ambulatory Visit (INDEPENDENT_AMBULATORY_CARE_PROVIDER_SITE_OTHER): Payer: BC Managed Care – PPO | Admitting: Adult Health

## 2021-12-04 ENCOUNTER — Encounter: Payer: Self-pay | Admitting: Adult Health

## 2021-12-04 VITALS — BP 122/69 | HR 62 | Ht 65.0 in | Wt 137.0 lb

## 2021-12-04 DIAGNOSIS — I69320 Aphasia following cerebral infarction: Secondary | ICD-10-CM

## 2021-12-04 DIAGNOSIS — I63412 Cerebral infarction due to embolism of left middle cerebral artery: Secondary | ICD-10-CM

## 2021-12-04 DIAGNOSIS — I69319 Unspecified symptoms and signs involving cognitive functions following cerebral infarction: Secondary | ICD-10-CM

## 2021-12-04 NOTE — Progress Notes (Signed)
[facility] [address] [place]. [place] [address] [phone]       STROKE FOLLOW UP NOTE  Natalie [name] Date of Birth:  06/29/1956 Medical Record Number:  [id number]   Reason for Referral: stroke follow up    SUBJECTIVE:   CHIEF COMPLAINT:  Chief Complaint  Natalie [name] presents with   Follow-up    Rm 3 with son Rodman Key  Pt is well,  son states she has been forgetting people and names more frequently.      HPI:   Update 12/04/2021 JM: Natalie [name] returns per request due to memory concerns. Completed MR brain after prior visit for visual concerns but MRI unremarkable for new findings. When she was called for results, she mentioned concerns of her memory worsening since her stroke. At prior visit, she reported her cognition had improved since her stroke and felt to be at baseline.  Today, son believes she has been forgetting peoples names more frequently. Can mix up names or say incorrect words, she will usually realize she says the wrong word and correct herself.  She will have good days and bad days.  This has been persistent since her stroke but believes over the past few months, this has slightly worsened. Will do memory and word games on her tablet but hasn't been doing these exercises more recently. MMSE today 23/30 with similar results back in September and even May 2022.  Compliant on all stroke prevention medications.  Routinely follows with PCP.  No further concerns at this time.    History provided for reference purposes only  Update 09/23/2021 JM: Natalie [name] returns for 6 months follow-up accompanied by her son.  Overall stable since prior visit without new stroke/TIA symptoms.  Reports residual occasional word finding difficulty although greatly improved. Cognition also greatly improved and currently at baseline. Son lives with her for 1 week at a time and then his own place for 1 week and keeps that rotation. She maintains ADLs and majority of IADLs, son helps  with bill paying and other assistance as needed. She does mention gradual decline of her vision (see A/P), plans on scheduling f/u visit with ophthalmologist soon.  Compliant on Plavix and atorvastatin, denies side effects.  Blood pressure today 126/77. Routinely monitors at home, typically stable. She has not had recent f/u with PCP.  No further concerns at this time.  Update 03/14/2021 JM: Natalie [name] returns for 56-monthstroke follow-up accompanied by her son.  Overall doing well.  Denies new stroke/TIA symptoms. Reports occasional word finding difficulty but overall improving and good with compensating.  MMSE today 23/30 (prior 22/30 - during stroke admission 12/30). She continues to maintain ADLs and majority of IADLs independently.  Her son does continue to live with her but plans on moving out soon.  She does question return to driving.  Compliant on Plavix and atorvastatin.  Blood pressure today 162/86 -similar on recheck. Routinely monitors at home and typically stable. No new concerns at this time  Initial visit 11/15/2020 JM: Ms. WVaroneis being seen for hospital follow-up accompanied by her son.  She was discharged home from CIR on 10/19/2020 after an 8-day stay.  Completed HH SLP on Monday with residual cognitive difficulties but overall recovering well and per son, he believes she has been doing better than her cognitive baseline prior to her stroke.  She continues to routinely do exercises as advised at home. Remains in her own home but her son has been staying with her.  She is able to maintain ADLs and majority of IADLs independently and son will assist as needed.  MMSE today 22/30 (12/30 during hospitalization).  She had greater difficulty with focusing and concentration as when we would move on to the next question such as place, she continuously tried to go back to time questions.  She has not yet returned back to driving.  Denies any functional deficits.  Denies new stroke/TIA symptoms  Remains on  Plavix and atorvastatin without associated side effects Blood pressure today 141/69 -does not routinely monitor at home is typically stable PCP recently recheck B12 level which was >4000 therefore supplement placed on hold and plans on repeating level in a couple of months Denies any current tobacco use (quit 6 years ago), EtOH or drug use Continued use of home oxygen 3L via [place] as needed Completed 30-day cardiac event monitor which did not show evidence of atrial fibrillation  No further concerns at this time  Stroke admission 09/30/2020 Natalie [name] is a 66 y.o. female with history of Cataracts, bilateral, Chickenpox (cataracts), COPD (chronic obstructive pulmonary disease) ([place]), Depression, Drug addiction ([place]), Headache, Hepatitis C virus infection, Hepatitis C without hepatic coma, History of chicken pox, History of colon polyps, and Seizures ([place]) who presented on 09/30/2020 with altered mental status on day of admission.  Personally reviewed hospitalization pertinent progress notes, lab work and imaging with summary provided.  Stroke team evaluated Natalie [name] on 10/10/2020 after repeat MRI on 4/5 showed acute infarct of left MCA (initial MRI negative).  Concern of embolic pattern and recommended 30 cardiac event monitor outpatient to further evaluate for possible A. fib. Recommended starting plavix with hx of aspirin intolerance. Concern of cognitive impairment with MMSE 12/30 with lack of insight, disorientation and confabulation - B12 85 and initiated supplement. LDL 136 - started atorvastatin '40mg'$  daily. Other stroke risk factors include tobacco use, migraines and questionable EtOH use. Evaluated by therapies and recommended CIR for ongoing therapy needs.   Stroke: Anterior Left MCA Infarct, embolic pattern, source unclear MRI brain left anterior MCA infarct MRA head and neck negative LE venous Doppler negative Recommend 30 day cardioembolic monitoring 2D Echo : LVEF: 55 to 60% LDL  136 HgbA1c 5.6 UDS + methadone (home meds) VTE prophylaxis -  SCDs No antithrombotic prior to admission, now on clopidogrel 75 mg daily. Allergy to ASA. Therapy recommendations:  CIR recommended and accepted Disposition:  d/c to CIR on 4/7     ROS:   14 system review of systems performed and negative with exception of those listed in HPI    PMH:  Past Medical History:  Diagnosis Date   Cataracts, bilateral    Chickenpox cataracts   COPD (chronic obstructive pulmonary disease) ([place])    Depression    Drug addiction ([place])    Headache    Hepatitis C virus infection    Hepatitis C without hepatic coma    History of chicken pox    History of colon polyps    Seizures ([place])    Stroke ([place])     PSH:  Past Surgical History:  Procedure Laterality Date   APPENDECTOMY     CHOLECYSTECTOMY     COLONOSCOPY WITH PROPOFOL N/A 04/30/2015   Procedure: COLONOSCOPY WITH PROPOFOL;  Surgeon: Josefine Class, MD;  [facility] END[facility]COPY;  Service: Endoscopy;  Laterality: N/A;   COLONOSCOPY WITH PROPOFOL N/A 07/13/2017   Procedure: COLONOSCOPY WITH PROPOFOL;  Surgeon: Manya Silvas, MD;  [facility]COPY;  Service: Endoscopy;  Laterality: N/A;   ESOPHAGOGASTRODUODENOSCOPY (EGD) WITH PROPOFOL N/A 07/13/2017   Procedure: ESOPHAGOGASTRODUODENOSCOPY (EGD) WITH PROPOFOL;  Surgeon: Manya Silvas, MD;  Location: [facility] ENDOSCOPY;  Service: Endoscopy;  Laterality: N/A;   EYE SURGERY      Social History:  Social History   Socioeconomic History   Marital status: Married    Spouse name: Not on file   Number of children: 2   Years of education: GED   Highest education level: Not on file  Occupational History   Not on file  Tobacco Use   Smoking status: Former    Packs/day: 0.50    Years: 41.00    Pack years: 20.50    Types: Cigarettes    Quit date: 10/27/2015    Years since quitting: 6.1   Smokeless tobacco: Never  Vaping Use   Vaping Use: Never used  Substance and  Sexual Activity   Alcohol use: No   Drug use: No   Sexual activity: Not Currently  Other Topics Concern   Not on file  Social History Narrative   03/14/21 son with her now, he lives 20 min away   significant other recently passed away   Social Determinants of Health   Financial Resource Strain: Not on file  Food Insecurity: Not on file  Transportation Needs: Not on file  Physical Activity: Not on file  Stress: Not on file  Social Connections: Not on file  Intimate Partner Violence: Not on file    Family History:  Family History  Problem Relation Age of Onset   Breast cancer Paternal Grandmother     Medications:   Current Outpatient Medications on File Prior to Visit  Medication Sig Dispense Refill   amLODipine (NORVASC) 5 MG tablet Take 5 mg by mouth daily.     atorvastatin (LIPITOR) 40 MG tablet Take 1 tablet (40 mg total) by mouth daily. 30 tablet 0   clopidogrel (PLAVIX) 75 MG tablet Take 1 tablet (75 mg total) by mouth daily. 30 tablet 0   methadone (DOLOPHINE) 1 mg/ml oral solution Take 45 mg/kg by mouth daily.     No current facility-administered medications on file prior to visit.    Allergies:   Allergies  Allergen Reactions   Sulfasalazine Shortness Of Breath   Aspirin    Darvon [Propoxyphene]    Iodine    Penicillins    Sulfa Antibiotics    Zithromax [Azithromycin]       OBJECTIVE:  Physical Exam  Vitals:   12/04/21 1251  BP: 122/69  Pulse: 62  Weight: 137 lb (62.1 kg)  Height: '5\' 5"'$  (1.651 m)    Body mass index is 22.8 kg/m. No results found.   General: Frail very pleasant older appearing than stated age 72 female, seated, in no evident distress Head: head normocephalic and atraumatic.   Neck: supple with no carotid or supraclavicular bruits Cardiovascular: regular rate and rhythm, no murmurs Musculoskeletal: no deformity Skin:  no rash/petichiae Vascular:  Normal pulses all extremities   Neurologic Exam Mental Status:  Awake and fully alert.   Unable to appreciate aphasia on today's exam.. Recent memory mildly impaired and remote memory intact. Attention span, concentration and fund of knowledge appropriate.  Mood and affect appropriate.     12/04/2021   12:57 PM 03/14/2021   11:01 AM 11/15/2020   11:55 AM  MMSE - Mini Mental State Exam  Orientation to time '5 5 3  '$ Orientation to Place '4 3 3  '$ Registration 3 3 3  Attention/ Calculation '1 1 3  '$ Recall '2 2 1  '$ Language- name 2 objects '2 2 2  '$ Language- repeat '1 1 1  '$ Language- follow 3 step command '3 3 3  '$ Language- read & follow direction '1 1 1  '$ Write a sentence '1 1 1  '$ Copy design 0 1 1  Total score '23 23 22   '$ Cranial Nerves: Pupils equal, briskly reactive to light. Extraocular movements full without nystagmus. Visual fields show blurred vision in left periphery bilaterally. Hearing intact. Facial sensation intact. Face, tongue, palate moves normally and symmetrically.  Motor: Normal bulk and tone. Normal strength in all tested extremity muscles Sensory.: intact to touch , pinprick , position and vibratory sensation.  Coordination: Rapid alternating movements normal in all extremities. Finger-to-nose and heel-to-shin performed accurately bilaterally. Gait and Station: Arises from chair without difficulty. Stance is normal. Gait demonstrates normal stride length and balance without use of assistive device.  Difficulty performing tandem walk and heel toe Reflexes: 1+ and symmetric. Toes downgoing.         ASSESSMENT: Natalie [name] is a 66 y.o. year old female presented with altered mental status on 09/30/2020 with initial MRI negative. On 4/5, MRI showed acute infarct of L MCA infarct, embolic secondary to unknown source. Vascular risk factors include HTN, HLD, hx of tobacco use, B12 deficiency and migraines.     PLAN:  Mild cognitive impairment: Relatively stable over the past year.  MMSE today 23/30 (prior 23/30 03/2021).  Discussed importance of routine  memory exercises as well as physical exercise into daily regimen. L MCA stroke :  Residual deficit: occasional word finding difficulty.  Reassured recent MRI negative for new stroke or concerning findings.  Encouraged her to restart word exercises which she was previously doing for benefit, also provided additional apps they could try on her tablet. Advised to call office if she wishes to participate in speech/cognitive therapy Cardiac monitor negative for atrial fibrillation Continue clopidogrel 75 mg daily  and atorvastatin  for secondary stroke prevention.   Discussed secondary stroke prevention measures and importance of close PCP follow up for aggressive stroke risk factor management including HTN BP goal<130/90 and HLD with LDL goal<70 Lipid panel 09/2021: LDL 60     Currently scheduled for follow-up visit in August, advised this can be canceled if she remains stable at that time and can plan on follow-up around November (6 months from today)    CC:  PCP: Sharyne Peach, MD    I spent 37 minutes of face-to-face and non-face-to-face time with Natalie [name] and son.  This included previsit chart review, discussion and education regarding prior stroke, residual deficits, memory and speech concerns, review and discussion of MMSE, education regarding secondary stroke prevention measures and answered all other questions to Natalie [name] and sons satisfaction  Frann Rider, AGNP-BC  [facility] [address] [place], [place] [address]  Phone [phone] Fax [phone] Note: This document was prepared with digital dictation and possible smart phrase technology. Any transcriptional errors that result from this process are unintentional.

## 2021-12-04 NOTE — Patient Instructions (Signed)
Based on your testing today, your memory has been stable over the past year.  Please ensure you are incorporating routine exercise into your daily regimen as well as memory exercises.  Maintain compensation strategies to help with your memory loss (examples below)  If you wish to trial speech therapy, please let me know and we can get this set up  Continue clopidogrel 75 mg daily  and atorvastatin for secondary stroke prevention  Continue to follow up with PCP regarding cholesterol and blood pressure management  Maintain strict control of hypertension with blood pressure goal below 130/90 and cholesterol with LDL cholesterol (bad cholesterol) goal below 70 mg/dL.   Signs of a Stroke? Follow the BEFAST method:  Balance Watch for a sudden loss of balance, trouble with coordination or vertigo Eyes Is there a sudden loss of vision in one or both eyes? Or double vision?  Face: Ask the person to smile. Does one side of the face droop or is it numb?  Arms: Ask the person to raise both arms. Does one arm drift downward? Is there weakness or numbness of a leg? Speech: Ask the person to repeat a simple phrase. Does the speech sound slurred/strange? Is the person confused ? Time: If you observe any of these signs, call 911.     Followup in the future with me in 6 months or call earlier if needed       Thank you for coming to see [place] at [facility]. I hope we have been able to provide you high quality care today.  You may receive a patient satisfaction survey over the next few weeks. We would appreciate your feedback and comments so that we may continue to improve ourselves and the health of our patients.     Memory Compensation Strategies  Use "WARM" strategy.  W= write it down  A= associate it  R= repeat it  M= make a mental note  2.   You can keep a Social worker.  Use a 3-ring notebook with sections for the following: calendar, important names and phone numbers,   medications, doctors' names/phone numbers, lists/reminders, and a section to journal what you did  each day.   3.    Use a calendar to write appointments down.  4.    Write yourself a schedule for the day.  This can be placed on the calendar or in a separate section of the Memory Notebook.  Keeping a  regular schedule can help memory.  5.    Use medication organizer with sections for each day or morning/evening pills.  You may need help loading it  6.    Keep a basket, or pegboard by the door.  Place items that you need to take out with you in the basket or on the pegboard.  You may also want to  include a message board for reminders.  7.    Use sticky notes.  Place sticky notes with reminders in a place where the task is performed.  For example: " turn off the  stove" placed by the stove, "lock the door" placed on the door at eye level, " take your medications" on  the bathroom mirror or by the place where you normally take your medications.  8.    Use alarms/timers.  Use while cooking to remind yourself to check on food or as a reminder to take your medicine, or as a  reminder to make a call, or as a reminder to perform another  task, etc.

## 2021-12-25 ENCOUNTER — Encounter (INDEPENDENT_AMBULATORY_CARE_PROVIDER_SITE_OTHER): Payer: Self-pay | Admitting: Nurse Practitioner

## 2021-12-25 ENCOUNTER — Ambulatory Visit (INDEPENDENT_AMBULATORY_CARE_PROVIDER_SITE_OTHER): Payer: BC Managed Care – PPO

## 2021-12-25 ENCOUNTER — Ambulatory Visit (INDEPENDENT_AMBULATORY_CARE_PROVIDER_SITE_OTHER): Payer: BC Managed Care – PPO | Admitting: Nurse Practitioner

## 2021-12-25 VITALS — BP 121/73 | HR 65 | Resp 17 | Ht 65.0 in | Wt 136.0 lb

## 2021-12-25 DIAGNOSIS — M7989 Other specified soft tissue disorders: Secondary | ICD-10-CM

## 2021-12-25 DIAGNOSIS — I739 Peripheral vascular disease, unspecified: Secondary | ICD-10-CM

## 2021-12-25 DIAGNOSIS — F17200 Nicotine dependence, unspecified, uncomplicated: Secondary | ICD-10-CM | POA: Diagnosis not present

## 2021-12-26 ENCOUNTER — Telehealth (INDEPENDENT_AMBULATORY_CARE_PROVIDER_SITE_OTHER): Payer: Self-pay

## 2021-12-26 ENCOUNTER — Encounter (INDEPENDENT_AMBULATORY_CARE_PROVIDER_SITE_OTHER): Payer: Self-pay | Admitting: Nurse Practitioner

## 2021-12-26 NOTE — Telephone Encounter (Signed)
Spoke with the patient and she is scheduled with Dr. Lucky Cowboy on 01/01/22 with a 6:45 am arrival time to the MM. Pre-procedure instructions were discussed and will be mailed.

## 2022-01-01 ENCOUNTER — Other Ambulatory Visit: Payer: Self-pay

## 2022-01-01 ENCOUNTER — Ambulatory Visit
Admission: RE | Admit: 2022-01-01 | Discharge: 2022-01-01 | Disposition: A | Discharge status: Home / Self Care | Payer: BC Managed Care – PPO | Attending: Vascular Surgery | Admitting: Vascular Surgery

## 2022-01-01 ENCOUNTER — Encounter: Payer: Self-pay | Admitting: Vascular Surgery

## 2022-01-01 ENCOUNTER — Encounter
Admission: RE | Disposition: A | Discharge status: Home / Self Care | Payer: Self-pay | Source: Home / Self Care | Attending: Vascular Surgery

## 2022-01-01 DIAGNOSIS — I700 Atherosclerosis of aorta: Secondary | ICD-10-CM | POA: Diagnosis not present

## 2022-01-01 DIAGNOSIS — I70221 Atherosclerosis of native arteries of extremities with rest pain, right leg: Secondary | ICD-10-CM | POA: Insufficient documentation

## 2022-01-01 DIAGNOSIS — Z87891 Personal history of nicotine dependence: Secondary | ICD-10-CM | POA: Diagnosis not present

## 2022-01-01 DIAGNOSIS — R600 Localized edema: Secondary | ICD-10-CM | POA: Insufficient documentation

## 2022-01-01 DIAGNOSIS — I70229 Atherosclerosis of native arteries of extremities with rest pain, unspecified extremity: Secondary | ICD-10-CM

## 2022-01-01 HISTORY — PX: LOWER EXTREMITY ANGIOGRAPHY: CATH118251

## 2022-01-01 LAB — BUN: BUN: 18 mg/dL (ref 8–23)

## 2022-01-01 LAB — CREATININE, SERUM
Creatinine, Ser: 1.39 mg/dL — ABNORMAL HIGH (ref 0.44–1.00)
GFR, Estimated: 42 mL/min — ABNORMAL LOW (ref >60)

## 2022-01-01 SURGERY — LOWER EXTREMITY ANGIOGRAPHY
Anesthesia: Moderate Sedation | Site: Leg Lower | Laterality: Right

## 2022-01-01 MED ORDER — METHYLPREDNISOLONE SODIUM SUCC 125 MG IJ SOLR
INTRAMUSCULAR | Status: AC
  Administered 2022-01-01: 125 mg via INTRAVENOUS
  Filled 2022-01-01: qty 2

## 2022-01-01 MED ORDER — SODIUM CHLORIDE 0.9 % IV SOLN: INTRAVENOUS | Status: DC

## 2022-01-01 MED ORDER — MIDAZOLAM HCL 2 MG/2ML IJ SOLN
INTRAMUSCULAR | Status: AC
  Filled 2022-01-01: qty 2

## 2022-01-01 MED ORDER — IODIXANOL 320 MG/ML IV SOLN
INTRAVENOUS | Status: DC | PRN
  Administered 2022-01-01: 70 mL

## 2022-01-01 MED ORDER — FAMOTIDINE 20 MG PO TABS
ORAL_TABLET | ORAL | Status: AC
  Administered 2022-01-01: 40 mg via ORAL
  Filled 2022-01-01: qty 1

## 2022-01-01 MED ORDER — FAMOTIDINE 20 MG PO TABS
ORAL_TABLET | ORAL | Status: AC
  Filled 2022-01-01: qty 1

## 2022-01-01 MED ORDER — MIDAZOLAM HCL 2 MG/ML PO SYRP: 8.0000 mg | ORAL_SOLUTION | Freq: Once | ORAL | Status: DC | PRN

## 2022-01-01 MED ORDER — HEPARIN SODIUM (PORCINE) 1000 UNIT/ML IJ SOLN
INTRAMUSCULAR | Status: DC | PRN
  Administered 2022-01-01: 5000 [IU] via INTRAVENOUS

## 2022-01-01 MED ORDER — HYDROMORPHONE HCL 1 MG/ML IJ SOLN: 1.0000 mg | Freq: Once | INTRAMUSCULAR | Status: DC | PRN

## 2022-01-01 MED ORDER — FAMOTIDINE 20 MG PO TABS: 40.0000 mg | ORAL_TABLET | Freq: Once | ORAL | Status: AC | PRN

## 2022-01-01 MED ORDER — VANCOMYCIN HCL 500 MG/100ML IV SOLN
INTRAVENOUS | Status: DC | PRN
  Administered 2022-01-01: 1000 mg via INTRAVENOUS

## 2022-01-01 MED ORDER — ONDANSETRON HCL 4 MG/2ML IJ SOLN
INTRAMUSCULAR | Status: AC
  Filled 2022-01-01: qty 2

## 2022-01-01 MED ORDER — DIPHENHYDRAMINE HCL 50 MG/ML IJ SOLN: 50.0000 mg | Freq: Once | INTRAMUSCULAR | Status: AC | PRN

## 2022-01-01 MED ORDER — ONDANSETRON HCL 4 MG/2ML IJ SOLN
4.0000 mg | Freq: Four times a day (QID) | INTRAMUSCULAR | Status: DC | PRN
Start: 2022-01-01 — End: 2022-01-01
  Administered 2022-01-01: 4 mg via INTRAVENOUS

## 2022-01-01 MED ORDER — METHYLPREDNISOLONE SODIUM SUCC 125 MG IJ SOLR: 125.0000 mg | Freq: Once | INTRAMUSCULAR | Status: AC | PRN

## 2022-01-01 MED ORDER — FENTANYL CITRATE (PF) 100 MCG/2ML IJ SOLN
INTRAMUSCULAR | Status: DC | PRN
  Administered 2022-01-01 (×3): 25 ug via INTRAVENOUS

## 2022-01-01 MED ORDER — FENTANYL CITRATE PF 50 MCG/ML IJ SOSY
PREFILLED_SYRINGE | INTRAMUSCULAR | Status: AC
  Filled 2022-01-01: qty 1

## 2022-01-01 MED ORDER — HEPARIN SODIUM (PORCINE) 1000 UNIT/ML IJ SOLN
INTRAMUSCULAR | Status: AC
  Filled 2022-01-01: qty 10

## 2022-01-01 MED ORDER — DIPHENHYDRAMINE HCL 50 MG/ML IJ SOLN
INTRAMUSCULAR | Status: AC
  Administered 2022-01-01: 50 mg via INTRAVENOUS
  Filled 2022-01-01: qty 1

## 2022-01-01 MED ORDER — VANCOMYCIN HCL IN DEXTROSE 1-5 GM/200ML-% IV SOLN
1000.0000 mg | INTRAVENOUS | Status: DC
  Filled 2022-01-01: qty 200

## 2022-01-01 MED ORDER — MIDAZOLAM HCL 2 MG/2ML IJ SOLN
INTRAMUSCULAR | Status: DC | PRN
  Administered 2022-01-01 (×2): 1 mg via INTRAVENOUS

## 2022-01-01 SURGICAL SUPPLY — 18 items
BALLN ATG 14X4X80 (BALLOONS) ×2
BALLN LUTONIX DCB 6X40X130 (BALLOONS) ×2
BALLOON ATG 14X4X80 (BALLOONS) IMPLANT
BALLOON LUTONIX DCB 6X40X130 (BALLOONS) IMPLANT
CATH ANGIO 5F PIGTAIL 65CM (CATHETERS) ×1 IMPLANT
COVER DRAPE FLUORO 36X44 (DRAPES) ×1 IMPLANT
COVER PROBE U/S 5X48 (MISCELLANEOUS) ×1 IMPLANT
DEVICE STARCLOSE SE CLOSURE (Vascular Products) ×2 IMPLANT
GLIDEWIRE ADV .035X260CM (WIRE) ×1 IMPLANT
KIT ENCORE 26 ADVANTAGE (KITS) ×1 IMPLANT
PACK ANGIOGRAPHY (CUSTOM PROCEDURE TRAY) ×3 IMPLANT
SHEATH BRITE TIP 5FRX11 (SHEATH) ×2 IMPLANT
SHEATH BRITE TIP 8FRX11 (SHEATH) ×1 IMPLANT
STENT LIFESTREAM 12X38X80 (Permanent Stent) ×1 IMPLANT
STENT LIFESTREAM 8X37X80 (Permanent Stent) ×1 IMPLANT
SYR MEDRAD MARK 7 150ML (SYRINGE) ×1 IMPLANT
TUBING CONTRAST HIGH PRESS 72 (TUBING) ×1 IMPLANT
WIRE GUIDERIGHT .035X150 (WIRE) ×1 IMPLANT

## 2022-01-01 NOTE — Op Note (Signed)
[name]  Percutaneous Study/Intervention Procedural Note   Date of Surgery: 01/01/2022  Surgeon(s):[name]    Assistants:none  Pre-operative Diagnosis: PAD with rest [name] right lower extremity  Post-operative diagnosis:  Same  Procedure(s) Performed:             1.  Ultrasound guidance for vascular access bilateral femoral arteries             2.  Catheter placement into aorta from bilateral femoral approach             3.  Aortogram and selective right lower extremity angiogram             4.  Percutaneous transluminal angioplasty of the aorta and the right common iliac artery with 6 mm diameter Lutonix drug-coated balloon to predilate the lesions             5.  Stent placement to the right common iliac artery with 8 mm diameter by 37 mm length lifestream stent  6.  Stent placement to the aorta with 12 mm diameter by 38 mm length lifestream stent postdilated with a 14 mm balloon             7.  StarClose closure device bilateral femoral arteries  EBL: 15 cc  Contrast: 70 cc  Fluoro Time: 4.2 minutes  Moderate Conscious Sedation Time: approximately 42 minutes using 2 mg of Versed and 75 mcg of Fentanyl              Indications:  Patient is a 66 y.o.female with rest [name] of the right foot. The patient has noninvasive study showing monophasic flow in the right femoral artery and very sluggish flow distally. The patient is brought in for angiography for further evaluation and potential treatment.  Due to the limb threatening nature of the situation, angiogram was performed for attempted limb salvage. The patient is aware that if the procedure fails, amputation would be expected.  The patient also understands that even with successful revascularization, amputation may still be required due to the severity of the situation.  Risks and benefits are discussed and informed consent is obtained.   Procedure:  The patient was identified and appropriate procedural  time out was performed.  The patient was then placed supine on the table and prepped and draped in the usual sterile fashion. Moderate conscious sedation was administered during a face to face encounter with the patient throughout the procedure with my supervision of the RN administering medicines and monitoring the patient's vital signs, pulse oximetry, telemetry and mental status throughout from the start of the procedure until the patient was taken to the recovery room. Ultrasound was used to evaluate the left common femoral artery.  It was patent .  A digital ultrasound image was acquired.  A Seldinger needle was used to access the left common femoral artery under direct ultrasound guidance and a permanent image was performed.  A 0.035 J wire was advanced without resistance and a 5Fr sheath was placed.  Pigtail catheter was placed into the aorta and an AP aortogram was performed. This demonstrated normal renal arteries bilaterally.  The aorta had a high-grade calcific stenosis in the mid to distal aorta in the 90 to 95% range.  This was calcific.  The distal aorta was fairly normal but the right common iliac artery had about an 80% calcific stenosis.  The left common iliac artery was fairly normal.  Both external iliac and internal iliac arteries were fairly  normal in both common femoral arteries appeared fairly normal. I then elected to image down the right leg to determine if significant infrainguinal disease was present as this may change our plan treatment options for the aorta and right iliac artery.  Selective right lower extremity angiogram was then performed. This demonstrated a fairly normal right common femoral artery, profunda femoris artery, superficial femoral and popliteal arteries.  There was a typical tibial trifurcation with what appeared to be two-vessel runoff distally with the posterior tibial and peroneal arteries.  The flow was a little sluggish from the inflow disease and the injection  being from the aorta. It was felt that it was in the patient's best interest to proceed with intervention after these images to avoid a second procedure and a larger amount of contrast and fluoroscopy based off of the findings from the initial angiogram. The patient was systemically heparinized after accessing the right femoral artery.  The right femoral artery was accessed under direct ultrasound guidance without difficulty with a Seldinger needle and a permanent image was recorded.  Initially, a 5 French sheath was placed and advantage wire was used to cross the right iliac and aortic lesions and then upsized to an 8 [place] sheath.  I predilated both lesions with a 6 mm balloon and then proceeded with definitive treatment to both the right common iliac artery and the mid to distal aorta.  The right common iliac artery was addressed with an 8 mm diameter by 37 mm length lifestream stent inflated to 10 atm.  Imaging following this showed no significant residual stenosis in the right iliac artery.  This was deployed approximately 3 to 4 mm below the origin of the right common iliac artery so there was no impingement on the left side.  The aortic lesion was then addressed with a 12 mm diameter by 38 mm length lifestream stent deployed in the mid to distal aorta encompassing the lesion with about a centimeter normal artery above and below the lesion within the stent.  This was inflated to 10 atm and then postdilated this stent with a 14 mm diameter balloon with excellent angiographic completion result and less than 10% residual stenosis.   I elected to terminate the procedure. The sheath was removed and StarClose closure device was deployed in the right femoral artery with excellent hemostatic result.  Similarly, the sheath was removed and StarClose closure device was deployed in the left femoral artery with excellent hemostatic result.  The patient was taken to the recovery room in stable condition having tolerated  the procedure well.  Findings:               Aortogram: Normal renal arteries bilaterally.  The aorta had a high-grade calcific stenosis in the mid to distal aorta in the 90 to 95% range.  This was calcific.  The distal aorta was fairly normal but the right common iliac artery had about an 80% calcific stenosis.  The left common iliac artery was fairly normal.  Both external iliac and internal iliac arteries were fairly normal in both common femoral arteries appeared fairly normal.             Right lower Extremity:  This demonstrated a fairly normal right common femoral artery, profunda femoris artery, superficial femoral and popliteal arteries.  There was a typical tibial trifurcation with what appeared to be two-vessel runoff distally with the posterior tibial and peroneal arteries.  The flow was a little sluggish from the inflow disease and  the injection being from the aorta.   Disposition: Patient was taken to the recovery room in stable condition having tolerated the procedure well.  Complications: None  Natalie [name] 01/01/2022 9:15 AM   This note was created with Dragon Medical transcription system. Any errors in dictation are purely unintentional.

## 2022-01-01 NOTE — Interval H&P Note (Signed)
History and Physical Interval Note:  01/01/2022 8:11 AM  Natalie [name]  has presented today for surgery, with the diagnosis of RLE Angio   BARD   ASO w rest pain.  The various methods of treatment have been discussed with the patient and family. After consideration of risks, benefits and other options for treatment, the patient has consented to  Procedure(s): Lower Extremity Angiography (Right) as a surgical intervention.  The patient's history has been reviewed, patient examined, no change in status, stable for surgery.  I have reviewed the patient's chart and labs.  Questions were answered to the patient's satisfaction.     Leotis Pain

## 2022-01-01 NOTE — H&P (View-Only) (Signed)
Subjective:    Patient ID: Natalie [name], female    DOB: [birth year], 66 y.o.   MRN: [id number] No chief complaint on file.   Natalie [name] is a 66 y.o. female.  She returns today for follow-up evaluation of lower extremity edema.  She notes that she had a fall on her right leg near the end of April.  She notes that she had pain in that lower extremity following her fall and has been aching ever since that time.  Family members do note that she probably had some swelling in that lower extremity prior to this fall.  She notes that sitting dependently makes the swelling much worse.  There is currently no open wounds or ulcerations.  She also notes that she does not have any pain in her lower extremities when she is sitting but she does have significant knee pain.  She also notes that she has pain with walking as well.  She notes that she has tried compression socks over the last few weeks and has been helpful but has not completely eliminated the swelling.  She notes that the right leg tends to be worse than the left.  Today noninvasive study showed no evidence of DVT or superficial thrombophlebitis bilaterally.  No evidence of deep venous insufficiency in the left lower extremity but there is deep venous insufficiency in the right.  No evidence of superficial venous reflux bilaterally.  An additional arterial duplex of the bilateral lower extremities was done and it was noted that the bilateral common femoral arteries and posterior tibial arteries have monophasic flow    Review of Systems  Cardiovascular:  Positive for leg swelling.  Musculoskeletal:  Positive for gait problem.  All other systems reviewed and are negative.      Objective:   Physical Exam Vitals reviewed.  HENT:     Head: Normocephalic.  Cardiovascular:     Rate and Rhythm: Normal rate.  Pulmonary:     Effort: Pulmonary effort is normal.     Comments: Home O2 Skin:    General: Skin is warm and dry.  Neurological:      Mental Status: She is alert and oriented to person, place, and time.  Psychiatric:        Mood and Affect: Mood normal.        Behavior: Behavior normal.        Thought Content: Thought content normal.        Judgment: Judgment normal.     BP 121/73 (BP Location: Right Arm)   Pulse 65   Resp 17   Ht '5\' 5"'$  (1.651 m)   Wt 136 lb (61.7 kg)   BMI 22.63 kg/m   Past Medical History:  Diagnosis Date   Cataracts, bilateral    Chickenpox cataracts   COPD (chronic obstructive pulmonary disease) (HCC)    Depression    Drug addiction ([place])    Headache    Hepatitis C virus infection    Hepatitis C without hepatic coma    History of chicken pox    History of colon polyps    Seizures (HCC)    Stroke [facility])     Social History   Socioeconomic History   Marital status: Married    Spouse name: Not on file   Number of children: 2   Years of education: GED   Highest education level: Not on file  Occupational History   Not on file  Tobacco Use   Smoking status: Former  Packs/day: 0.50    Years: 41.00    Total pack years: 20.50    Types: Cigarettes    Quit date: 10/27/2015    Years since quitting: 6.1   Smokeless tobacco: Never  Vaping Use   Vaping Use: Never used  Substance and Sexual Activity   Alcohol use: No   Drug use: No   Sexual activity: Not Currently  Other Topics Concern   Not on file  Social History Narrative   03/14/21 son with her now, he lives 20 min away   significant other recently passed away   Social Determinants of Health   Financial Resource Strain: Not on file  Food Insecurity: Not on file  Transportation Needs: Not on file  Physical Activity: Not on file  Stress: Not on file  Social Connections: Not on file  Intimate Partner Violence: Not on file    Past Surgical History:  Procedure Laterality Date   APPENDECTOMY     CHOLECYSTECTOMY     COLONOSCOPY WITH PROPOFOL N/A 04/30/2015   Procedure: COLONOSCOPY WITH PROPOFOL;  Surgeon: Josefine Class, MD;  Location: [facility] ENDOSCOPY;  Service: Endoscopy;  Laterality: N/A;   COLONOSCOPY WITH PROPOFOL N/A 07/13/2017   Procedure: COLONOSCOPY WITH PROPOFOL;  Surgeon: Manya Silvas, MD;  Location: [facility] ENDOSCOPY;  Service: Endoscopy;  Laterality: N/A;   ESOPHAGOGASTRODUODENOSCOPY (EGD) WITH PROPOFOL N/A 07/13/2017   Procedure: ESOPHAGOGASTRODUODENOSCOPY (EGD) WITH PROPOFOL;  Surgeon: Manya Silvas, MD;  Location: [facility] ENDOSCOPY;  Service: Endoscopy;  Laterality: N/A;   EYE SURGERY      Family History  Problem Relation Age of Onset   Breast cancer Paternal Grandmother     Allergies  Allergen Reactions   Sulfasalazine Shortness Of Breath   Aspirin    Darvon [Propoxyphene]    Iodine    Penicillins    Sulfa Antibiotics    Zithromax [Azithromycin]        Latest Ref Rng & Units 09/24/2021   11:06 AM 10/15/2020    7:00 AM 10/12/2020    5:24 AM  CBC  WBC 3.4 - 10.8 x10E3/uL 5.8  5.0  6.8   Hemoglobin 11.1 - 15.9 g/dL 14.9  13.2  12.2   Hematocrit 34.0 - 46.6 % 43.2  40.4  36.9   Platelets 150 - 450 x10E3/uL 220  288  295       CMP     Component Value Date/Time   NA 142 09/24/2021 1106   NA 137 05/09/2012 0748   K 4.4 09/24/2021 1106   K 2.9 (L) 05/09/2012 0748   CL 101 09/24/2021 1106   CL 99 05/09/2012 0748   CO2 26 09/24/2021 1106   CO2 32 05/09/2012 0748   GLUCOSE 86 09/24/2021 1106   GLUCOSE 93 10/15/2020 0700   GLUCOSE 108 (H) 05/09/2012 0748   BUN 17 09/24/2021 1106   BUN 11 05/09/2012 0748   CREATININE 1.25 (H) 09/24/2021 1106   CREATININE 0.64 05/09/2012 0748   CALCIUM 9.7 09/24/2021 1106   CALCIUM 9.1 05/09/2012 0748   PROT 7.1 09/24/2021 1106   PROT 7.1 05/01/2012 0823   ALBUMIN 4.8 09/24/2021 1106   ALBUMIN 3.9 05/01/2012 0823   AST 21 09/24/2021 1106   AST 13 (L) 05/01/2012 0823   ALT 18 09/24/2021 1106   ALT 21 05/01/2012 0823   ALKPHOS 145 (H) 09/24/2021 1106   ALKPHOS 173 (H) 05/01/2012 0823   BILITOT 1.5 (H) 09/24/2021 1106   BILITOT 1.4  (H) 05/01/2012 0823   GFRNONAA >  60 10/15/2020 0700   GFRNONAA >60 05/09/2012 0748   GFRAA >60 07/25/2017 0444   GFRAA >60 05/09/2012 0748     No results found.     Assessment & Plan:   1. Swelling of limb The patient does have evidence of deep venous insufficiency in the right lower extremity.  She is advised to continue to utilize medical grade compression socks daily.  She should also elevate her lower extremities and be active as tolerated.  2. Tobacco use disorder Smoking cessation was discussed, 3-10 minutes spent on this topic specifically   3. PAD (peripheral artery disease) (HCC) Given the patient's studies today as well as her symptoms of pain it is concerning for possible significant peripheral arterial disease.  I discussed with the patient the options being to repeat different sort of ultrasound to further examine possible areas of stenosis, and to undergo a CT angiogram which would give a view of the vessels to determine if there was a significant stenosis but it would not allow [place] for intervention, or undergoing a lower extremity angiogram.  I discussed with patient that if she has poor heart function this may cause the studies to resolve the way that they are displayed today.  Following discussion with patient and her family she elected to undergo a angiogram.  We discussed the risk, benefits and alternatives.  She agrees to proceed.  We will have the patient follow-up in office post procedure.   Current Outpatient Medications on File Prior to Visit  Medication Sig Dispense Refill   amLODipine (NORVASC) 5 MG tablet Take 5 mg by mouth daily.     atorvastatin (LIPITOR) 40 MG tablet Take 1 tablet (40 mg total) by mouth daily. 30 tablet 0   clopidogrel (PLAVIX) 75 MG tablet Take 1 tablet (75 mg total) by mouth daily. 30 tablet 0   methadone (DOLOPHINE) 1 mg/ml oral solution Take 45 mg/kg by mouth daily.     No current facility-administered medications on file prior to visit.     There are no Patient Instructions on file for this visit. No follow-ups on file.   Kris Hartmann, NP

## 2022-01-01 NOTE — Progress Notes (Signed)
 Subjective:    Patient ID: Natalie [name], female    DOB: 01/09/1956, 66 y.o.   MRN: [id number] No chief complaint on file.   Natalie [name] is a 66 y.o. female.  She returns today for follow-up evaluation of lower extremity edema.  She notes that she had a fall on her right leg near the end of April.  She notes that she had pain in that lower extremity following her fall and has been aching ever since that time.  Family members do note that she probably had some swelling in that lower extremity prior to this fall.  She notes that sitting dependently makes the swelling much worse.  There is currently no open wounds or ulcerations.  She also notes that she does not have any pain in her lower extremities when she is sitting but she does have significant knee pain.  She also notes that she has pain with walking as well.  She notes that she has tried compression socks over the last few weeks and has been helpful but has not completely eliminated the swelling.  She notes that the right leg tends to be worse than the left.  Today noninvasive study showed no evidence of DVT or superficial thrombophlebitis bilaterally.  No evidence of deep venous insufficiency in the left lower extremity but there is deep venous insufficiency in the right.  No evidence of superficial venous reflux bilaterally.  An additional arterial duplex of the bilateral lower extremities was done and it was noted that the bilateral common femoral arteries and posterior tibial arteries have monophasic flow    Review of Systems  Cardiovascular:  Positive for leg swelling.  Musculoskeletal:  Positive for gait problem.  All other systems reviewed and are negative.      Objective:   Physical Exam Vitals reviewed.  HENT:     Head: Normocephalic.  Cardiovascular:     Rate and Rhythm: Normal rate.  Pulmonary:     Effort: Pulmonary effort is normal.     Comments: Home O2 Skin:    General: Skin is warm and dry.  Neurological:      Mental Status: She is alert and oriented to person, place, and time.  Psychiatric:        Mood and Affect: Mood normal.        Behavior: Behavior normal.        Thought Content: Thought content normal.        Judgment: Judgment normal.     BP 121/73 (BP Location: Right Arm)   Pulse 65   Resp 17   Ht 5' 5" (1.651 m)   Wt 136 lb (61.7 kg)   BMI 22.63 kg/m   Past Medical History:  Diagnosis Date   Cataracts, bilateral    Chickenpox cataracts   COPD (chronic obstructive pulmonary disease) (HCC)    Depression    Drug addiction (HCC)    Headache    Hepatitis C virus infection    Hepatitis C without hepatic coma    History of chicken pox    History of colon polyps    Seizures (HCC)    Stroke (HCC)     Social History   Socioeconomic History   Marital status: Married    Spouse name: Not on file   Number of children: 2   Years of education: GED   Highest education level: Not on file  Occupational History   Not on file  Tobacco Use   Smoking status: Former      Packs/day: 0.50    Years: 41.00    Total pack years: 20.50    Types: Cigarettes    Quit date: 10/27/2015    Years since quitting: 6.1   Smokeless tobacco: Never  Vaping Use   Vaping Use: Never used  Substance and Sexual Activity   Alcohol use: No   Drug use: No   Sexual activity: Not Currently  Other Topics Concern   Not on file  Social History Narrative   03/14/21 son with her now, he lives 20 min away   significant other recently passed away   Social Determinants of Health   Financial Resource Strain: Not on file  Food Insecurity: Not on file  Transportation Needs: Not on file  Physical Activity: Not on file  Stress: Not on file  Social Connections: Not on file  Intimate Partner Violence: Not on file    Past Surgical History:  Procedure Laterality Date   APPENDECTOMY     CHOLECYSTECTOMY     COLONOSCOPY WITH PROPOFOL N/A 04/30/2015   Procedure: COLONOSCOPY WITH PROPOFOL;  Surgeon: [name] Gordon  Rein, MD;  Location: [facility] ENDOSCOPY;  Service: Endoscopy;  Laterality: N/A;   COLONOSCOPY WITH PROPOFOL N/A 07/13/2017   Procedure: COLONOSCOPY WITH PROPOFOL;  Surgeon: Elliott, Robert T, MD;  Location: [facility] ENDOSCOPY;  Service: Endoscopy;  Laterality: N/A;   ESOPHAGOGASTRODUODENOSCOPY (EGD) WITH PROPOFOL N/A 07/13/2017   Procedure: ESOPHAGOGASTRODUODENOSCOPY (EGD) WITH PROPOFOL;  Surgeon: Elliott, Robert T, MD;  Location: [facility] ENDOSCOPY;  Service: Endoscopy;  Laterality: N/A;   EYE SURGERY      Family History  Problem Relation Age of Onset   Breast cancer Paternal Grandmother     Allergies  Allergen Reactions   Sulfasalazine Shortness Of Breath   Aspirin    Darvon [Propoxyphene]    Iodine    Penicillins    Sulfa Antibiotics    Zithromax [Azithromycin]        Latest Ref Rng & Units 09/24/2021   11:06 AM 10/15/2020    7:00 AM 10/12/2020    5:24 AM  CBC  WBC 3.4 - 10.8 x10E3/uL 5.8  5.0  6.8   Hemoglobin 11.1 - 15.9 g/dL 14.9  13.2  12.2   Hematocrit 34.0 - 46.6 % 43.2  40.4  36.9   Platelets 150 - 450 x10E3/uL 220  288  295       CMP     Component Value Date/Time   NA 142 09/24/2021 1106   NA 137 05/09/2012 0748   K 4.4 09/24/2021 1106   K 2.9 (L) 05/09/2012 0748   CL 101 09/24/2021 1106   CL 99 05/09/2012 0748   CO2 26 09/24/2021 1106   CO2 32 05/09/2012 0748   GLUCOSE 86 09/24/2021 1106   GLUCOSE 93 10/15/2020 0700   GLUCOSE 108 (H) 05/09/2012 0748   BUN 17 09/24/2021 1106   BUN 11 05/09/2012 0748   CREATININE 1.25 (H) 09/24/2021 1106   CREATININE 0.64 05/09/2012 0748   CALCIUM 9.7 09/24/2021 1106   CALCIUM 9.1 05/09/2012 0748   PROT 7.1 09/24/2021 1106   PROT 7.1 05/01/2012 0823   ALBUMIN 4.8 09/24/2021 1106   ALBUMIN 3.9 05/01/2012 0823   AST 21 09/24/2021 1106   AST 13 (L) 05/01/2012 0823   ALT 18 09/24/2021 1106   ALT 21 05/01/2012 0823   ALKPHOS 145 (H) 09/24/2021 1106   ALKPHOS 173 (H) 05/01/2012 0823   BILITOT 1.5 (H) 09/24/2021 1106   BILITOT 1.4  (H) 05/01/2012 0823   GFRNONAA >  60 10/15/2020 0700   GFRNONAA >60 05/09/2012 0748   GFRAA >60 07/25/2017 0444   GFRAA >60 05/09/2012 0748     No results found.     Assessment & Plan:   1. Swelling of limb The patient does have evidence of deep venous insufficiency in the right lower extremity.  She is advised to continue to utilize medical grade compression socks daily.  She should also elevate her lower extremities and be active as tolerated.  2. Tobacco use disorder Smoking cessation was discussed, 3-10 minutes spent on this topic specifically   3. PAD (peripheral artery disease) (HCC) Given the patient's studies today as well as her symptoms of pain it is concerning for possible significant peripheral arterial disease.  I discussed with the patient the options being to repeat different sort of ultrasound to further examine possible areas of stenosis, and to undergo a CT angiogram which would give a view of the vessels to determine if there was a significant stenosis but it would not allow us for intervention, or undergoing a lower extremity angiogram.  I discussed with patient that if she has poor heart function this may cause the studies to resolve the way that they are displayed today.  Following discussion with patient and her family she elected to undergo a angiogram.  We discussed the risk, benefits and alternatives.  She agrees to proceed.  We will have the patient follow-up in office post procedure.   Current Outpatient Medications on File Prior to Visit  Medication Sig Dispense Refill   amLODipine (NORVASC) 5 MG tablet Take 5 mg by mouth daily.     atorvastatin (LIPITOR) 40 MG tablet Take 1 tablet (40 mg total) by mouth daily. 30 tablet 0   clopidogrel (PLAVIX) 75 MG tablet Take 1 tablet (75 mg total) by mouth daily. 30 tablet 0   methadone (DOLOPHINE) 1 mg/ml oral solution Take 45 mg/kg by mouth daily.     No current facility-administered medications on file prior to visit.     There are no Patient Instructions on file for this visit. No follow-ups on file.   Kareemah Grounds E Irie Dowson, NP   

## 2022-02-03 ENCOUNTER — Other Ambulatory Visit (INDEPENDENT_AMBULATORY_CARE_PROVIDER_SITE_OTHER): Payer: Self-pay | Admitting: Vascular Surgery

## 2022-02-03 DIAGNOSIS — Z95820 Peripheral vascular angioplasty status with implants and grafts: Secondary | ICD-10-CM

## 2022-02-03 DIAGNOSIS — I70221 Atherosclerosis of native arteries of extremities with rest pain, right leg: Secondary | ICD-10-CM

## 2022-02-04 ENCOUNTER — Encounter (INDEPENDENT_AMBULATORY_CARE_PROVIDER_SITE_OTHER): Payer: Self-pay | Admitting: Nurse Practitioner

## 2022-02-04 ENCOUNTER — Ambulatory Visit (INDEPENDENT_AMBULATORY_CARE_PROVIDER_SITE_OTHER): Payer: BC Managed Care – PPO | Admitting: Nurse Practitioner

## 2022-02-04 ENCOUNTER — Ambulatory Visit (INDEPENDENT_AMBULATORY_CARE_PROVIDER_SITE_OTHER): Payer: BC Managed Care – PPO

## 2022-02-04 VITALS — BP 124/80 | HR 64 | Resp 17 | Ht 65.0 in | Wt 139.0 lb

## 2022-02-04 DIAGNOSIS — I70221 Atherosclerosis of native arteries of extremities with rest pain, right leg: Secondary | ICD-10-CM | POA: Diagnosis not present

## 2022-02-04 DIAGNOSIS — F17200 Nicotine dependence, unspecified, uncomplicated: Secondary | ICD-10-CM

## 2022-02-04 NOTE — Progress Notes (Signed)
Subjective:    Patient ID: Natalie [name], female    DOB: 1956-03-19, 66 y.o.   MRN: [id number] No chief complaint on file.   The patient returns to the office for followup and review status post angiogram with intervention on 01/01/2022.   Procedure: Procedure(s) Performed:             1.  Ultrasound guidance for vascular access bilateral femoral arteries             2.  Catheter placement into aorta from bilateral femoral approach             3.  Aortogram and selective right lower extremity angiogram             4.  Percutaneous transluminal angioplasty of the aorta and the right common iliac artery with 6 mm diameter Lutonix drug-coated balloon to predilate the lesions             5.  Stent placement to the right common iliac artery with 8 mm diameter by 37 mm length lifestream stent             6.  Stent placement to the aorta with 12 mm diameter by 38 mm length lifestream stent postdilated with a 14 mm balloon             7.  StarClose closure device bilateral femoral arteries    The patient notes improvement in the lower extremity symptoms. No interval shortening of the patient's claudication distance or rest pain symptoms. No new ulcers or wounds have occurred since the last visit.  She notes that her legs feel much better with no pain.  The swelling is also improved.  There have been no significant changes to the patient's overall health care.  No documented history of amaurosis fugax or recent TIA symptoms. There are no recent neurological changes noted. No documented history of DVT, PE or superficial thrombophlebitis. The patient denies recent episodes of angina or shortness of breath.   ABI's Rt=1.18 and Lt=1.03   Duplex US of the bilateral lower extremity shows triphasic tibial artery waveforms bilaterally    Review of Systems  Respiratory:         Home O2  All other systems reviewed and are negative.      Objective:   Physical Exam Vitals reviewed.  HENT:      Head: Normocephalic.  Cardiovascular:     Rate and Rhythm: Normal rate.     Pulses:          Dorsalis pedis pulses are detected w/ Doppler on the right side and detected w/ Doppler on the left side.       Posterior tibial pulses are detected w/ Doppler on the right side and detected w/ Doppler on the left side.  Pulmonary:     Effort: Pulmonary effort is normal.  Musculoskeletal:     Right lower leg: No edema.     Left lower leg: No edema.  Skin:    General: Skin is warm and dry.  Neurological:     Mental Status: She is alert and oriented to person, place, and time.  Psychiatric:        Mood and Affect: Mood normal.        Behavior: Behavior normal.        Thought Content: Thought content normal.        Judgment: Judgment normal.     BP 124/80 (BP Location: Right Arm)   Pulse  64   Resp 17   Ht '5\' 5"'$  (1.651 m)   Wt 139 lb (63 kg)   BMI 23.13 kg/m   Past Medical History:  Diagnosis Date   Cataracts, bilateral    Chickenpox cataracts   COPD (chronic obstructive pulmonary disease) (HCC)    Depression    Drug addiction (HCC)    Headache    Hepatitis C virus infection    Hepatitis C without hepatic coma    History of chicken pox    History of colon polyps    Seizures (HCC)    Stroke (HCC)     Social History   Socioeconomic History   Marital status: Married    Spouse name: Not on file   Number of children: 2   Years of education: GED   Highest education level: Not on file  Occupational History   Not on file  Tobacco Use   Smoking status: Former    Packs/day: 0.50    Years: 41.00    Total pack years: 20.50    Types: Cigarettes    Quit date: 10/27/2015    Years since quitting: 6.2   Smokeless tobacco: Never  Vaping Use   Vaping Use: Never used  Substance and Sexual Activity   Alcohol use: No   Drug use: No   Sexual activity: Not Currently  Other Topics Concern   Not on file  Social History Narrative   03/14/21 son with her now, he lives 20 min away    significant other recently passed away   Social Determinants of Health   Financial Resource Strain: Not on file  Food Insecurity: Not on file  Transportation Needs: Not on file  Physical Activity: Not on file  Stress: Not on file  Social Connections: Not on file  Intimate Partner Violence: Not on file    Past Surgical History:  Procedure Laterality Date   APPENDECTOMY     CHOLECYSTECTOMY     COLONOSCOPY WITH PROPOFOL N/A 04/30/2015   Procedure: COLONOSCOPY WITH PROPOFOL;  Surgeon: Josefine Class, MD;  Location: [facility] ENDOSCOPY;  Service: Endoscopy;  Laterality: N/A;   COLONOSCOPY WITH PROPOFOL N/A 07/13/2017   Procedure: COLONOSCOPY WITH PROPOFOL;  Surgeon: Manya Silvas, MD;  Location[facility] ENDOSCOPY;  Service: Endoscopy;  Laterality: N/A;   ESOPHAGOGASTRODUODENOSCOPY (EGD) WITH PROPOFOL N/A 07/13/2017   Procedure: ESOPHAGOGASTRODUODENOSCOPY (EGD) WITH PROPOFOL;  Surgeon: Manya Silvas, MD;  Location: [facility] ENDOSCOPY;  Service: Endoscopy;  Laterality: N/A;   EYE SURGERY     LOWER EXTREMITY ANGIOGRAPHY Right 01/01/2022   Procedure: Lower Extremity Angiography;  Surgeon: Algernon Huxley, MD;  Location: [place] CV LAB;  Service: Cardiovascular;  Laterality: Right;    Family History  Problem Relation Age of Onset   Breast cancer Paternal Grandmother     Allergies  Allergen Reactions   Sulfasalazine Shortness Of Breath   Aspirin    Darvon [Propoxyphene]    Iodine    Penicillins    Sulfa Antibiotics    Zithromax [Azithromycin]        Latest Ref Rng & Units 09/24/2021   11:06 AM 10/15/2020    7:00 AM 10/12/2020    5:24 AM  CBC  WBC 3.4 - 10.8 x10E3/uL 5.8  5.0  6.8   Hemoglobin 11.1 - 15.9 g/dL 14.9  13.2  12.2   Hematocrit 34.0 - 46.6 % 43.2  40.4  36.9   Platelets 150 - 450 x10E3/uL 220  288  295  CMP     Component Value Date/Time   NA 142 09/24/2021 1106   NA 137 05/09/2012 0748   K 4.4 09/24/2021 1106   K 2.9 (L) 05/09/2012 0748   CL 101  09/24/2021 1106   CL 99 05/09/2012 0748   CO2 26 09/24/2021 1106   CO2 32 05/09/2012 0748   GLUCOSE 86 09/24/2021 1106   GLUCOSE 93 10/15/2020 0700   GLUCOSE 108 (H) 05/09/2012 0748   BUN 18 01/01/2022 0741   BUN 17 09/24/2021 1106   BUN 11 05/09/2012 0748   CREATININE 1.39 (H) 01/01/2022 0741   CREATININE 0.64 05/09/2012 0748   CALCIUM 9.7 09/24/2021 1106   CALCIUM 9.1 05/09/2012 0748   PROT 7.1 09/24/2021 1106   PROT 7.1 05/01/2012 0823   ALBUMIN 4.8 09/24/2021 1106   ALBUMIN 3.9 05/01/2012 0823   AST 21 09/24/2021 1106   AST 13 (L) 05/01/2012 0823   ALT 18 09/24/2021 1106   ALT 21 05/01/2012 0823   ALKPHOS 145 (H) 09/24/2021 1106   ALKPHOS 173 (H) 05/01/2012 0823   BILITOT 1.5 (H) 09/24/2021 1106   BILITOT 1.4 (H) 05/01/2012 0823   GFRNONAA 42 (L) 01/01/2022 0741   GFRNONAA >60 05/09/2012 0748   GFRAA >60 07/25/2017 0444   GFRAA >60 05/09/2012 0748     No results found.     Assessment & Plan:   1. 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  The patient reports that the claudication symptoms and leg pain has improved.   The patient denies lifestyle limiting changes at this point in time.  No further invasive studies, angiography or surgery at this time The patient should continue walking and begin a more formal exercise program.  The patient should continue antiplatelet therapy and aggressive treatment of the lipid abnormalities  Continued surveillance is indicated as atherosclerosis is likely to progress with time.    Patient should undergo noninvasive studies as ordered. The patient will follow up with me to review the studies.   - VAS [place] ABI WITH/WO TBI  2. Tobacco use disorder Encouraged continued abstinence   Current Outpatient Medications on File Prior to Visit  Medication Sig Dispense Refill   amLODipine (NORVASC) 5 MG tablet Take 5 mg by mouth  daily.     atorvastatin (LIPITOR) 40 MG tablet Take 1 tablet (40 mg total) by mouth daily. 30 tablet 0   clopidogrel (PLAVIX) 75 MG tablet Take 1 tablet (75 mg total) by mouth daily. 30 tablet 0   methadone (DOLOPHINE) 1 mg/ml oral solution Take 45 mg/kg by mouth daily.     ipratropium-albuterol (DUONEB) 0.5-2.5 (3) MG/3ML SOLN Take by nebulization.     No current facility-administered medications on file prior to visit.    There are no Patient Instructions on file for this visit. No follow-ups on file.   Kris Hartmann, NP

## 2022-02-06 ENCOUNTER — Ambulatory Visit: Payer: BC Managed Care – PPO | Admitting: Adult Health

## 2022-03-28 IMAGING — DX DG CHEST 1V PORT
1 series · 1 of 1 positions shown · non-contrast
Comparison: None.

CLINICAL DATA: Respiratory failure

EXAM:
PORTABLE CHEST 1 VIEW

[chest ap]
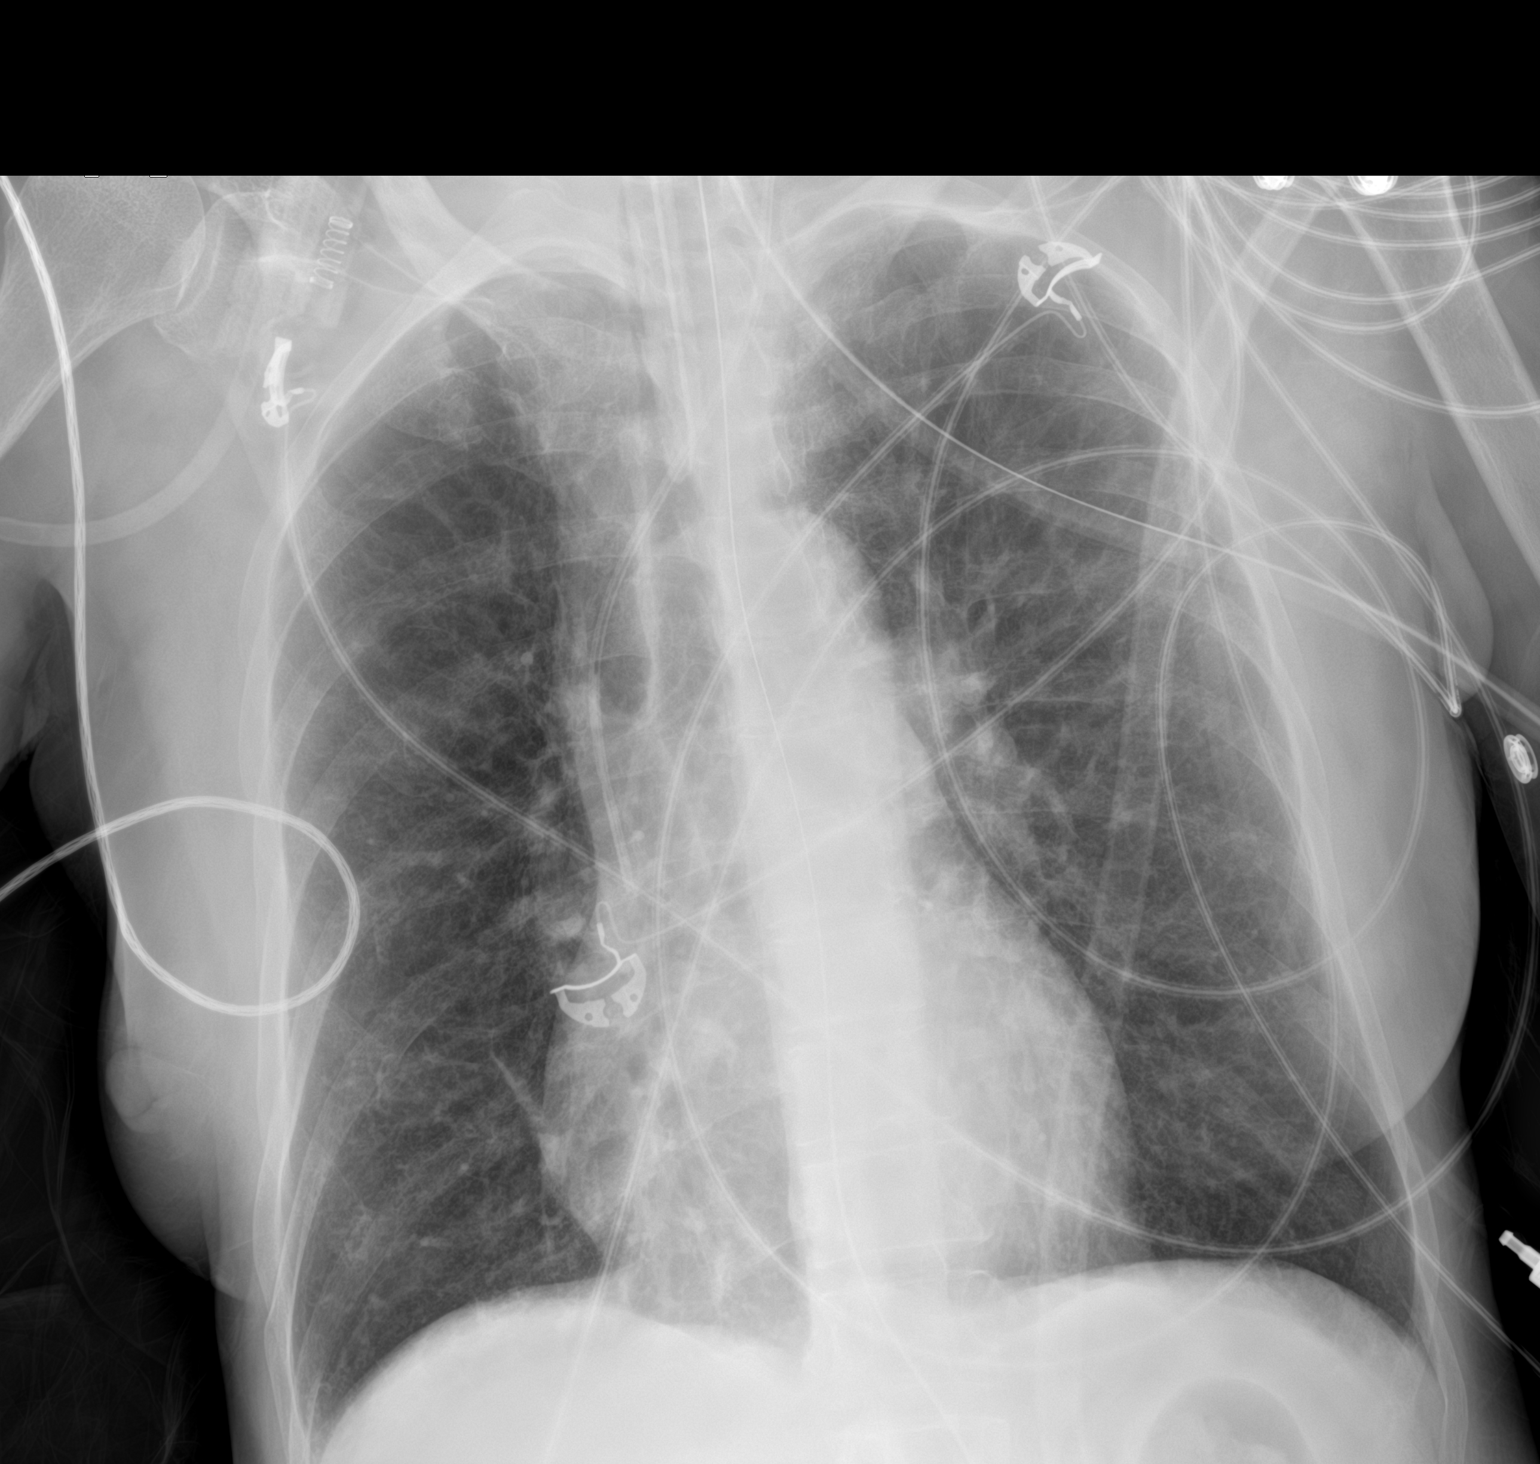

[1 of 1 positions shown; findings below may reference images not displayed]

FINDINGS: Endotracheal tube seen 6.3 cm above the carina. Nasogastric tube
extends into the upper abdomen. The lungs are stably mildly
hyperinflated. Superimposed mild interstitial infiltrate has
developed within the left lung, possibly infectious or inflammatory.
No pneumothorax or pleural effusion. Cardiac size within normal
limits. Left internal jugular central venous catheter tip unchanged
within the superior cavoatrial junction. No acute bone abnormality.
IMPRESSION: Stable support lines and tubes.

Stable pulmonary hyperinflation.

Developing interstitial infiltrate within the left lung, possibly
infectious or inflammatory.

## 2022-03-31 ENCOUNTER — Ambulatory Visit: Payer: BC Managed Care – PPO | Admitting: Adult Health

## 2022-05-05 ENCOUNTER — Other Ambulatory Visit (INDEPENDENT_AMBULATORY_CARE_PROVIDER_SITE_OTHER): Payer: Self-pay | Admitting: Nurse Practitioner

## 2022-05-05 ENCOUNTER — Encounter (INDEPENDENT_AMBULATORY_CARE_PROVIDER_SITE_OTHER): Payer: Self-pay

## 2022-05-05 DIAGNOSIS — Z9889 Other specified postprocedural states: Secondary | ICD-10-CM

## 2022-05-08 ENCOUNTER — Ambulatory Visit (INDEPENDENT_AMBULATORY_CARE_PROVIDER_SITE_OTHER): Payer: BC Managed Care – PPO | Admitting: Nurse Practitioner

## 2022-05-08 ENCOUNTER — Ambulatory Visit (INDEPENDENT_AMBULATORY_CARE_PROVIDER_SITE_OTHER): Payer: BC Managed Care – PPO

## 2022-05-08 ENCOUNTER — Encounter (INDEPENDENT_AMBULATORY_CARE_PROVIDER_SITE_OTHER): Payer: Self-pay | Admitting: Nurse Practitioner

## 2022-05-08 VITALS — BP 135/84 | HR 68 | Resp 18 | Ht 65.0 in | Wt 135.4 lb

## 2022-05-08 DIAGNOSIS — Z9889 Other specified postprocedural states: Secondary | ICD-10-CM | POA: Diagnosis not present

## 2022-05-08 DIAGNOSIS — I739 Peripheral vascular disease, unspecified: Secondary | ICD-10-CM | POA: Diagnosis not present

## 2022-05-08 DIAGNOSIS — N1831 Chronic kidney disease, stage 3a: Secondary | ICD-10-CM | POA: Diagnosis not present

## 2022-05-08 DIAGNOSIS — I70221 Atherosclerosis of native arteries of extremities with rest pain, right leg: Secondary | ICD-10-CM | POA: Diagnosis not present

## 2022-05-08 DIAGNOSIS — M7989 Other specified soft tissue disorders: Secondary | ICD-10-CM | POA: Diagnosis not present

## 2022-05-18 ENCOUNTER — Encounter (INDEPENDENT_AMBULATORY_CARE_PROVIDER_SITE_OTHER): Payer: Self-pay | Admitting: Nurse Practitioner

## 2022-05-18 NOTE — Progress Notes (Signed)
Subjective:    Patient ID: Natalie [name], female    DOB: [birth year], 66 y.o.   MRN: [id number] No chief complaint on file.   The patient returns to the office for followup and review of the noninvasive studies.   There have been no interval changes in lower extremity symptoms. No interval shortening of the patient's claudication distance or development of rest pain symptoms. No new ulcers or wounds have occurred since the last visit.  The patient does complain of having some lower extremity edema worsened from her previous visit.  There have been no significant changes to the patient's overall health care.  The patient denies amaurosis fugax or recent TIA symptoms. There are no documented recent neurological changes noted. There is no history of DVT, PE or superficial thrombophlebitis. The patient denies recent episodes of angina or shortness of breath.   ABI Rt=1.13 and Lt=1.07  (previous ABI's Rt=1.18 and Lt=1.03) Duplex ultrasound of the triphasic tibial artery waveforms bilaterally with good toe waveforms bilaterally.    Review of Systems  Cardiovascular:  Positive for leg swelling.  All other systems reviewed and are negative.      Objective:   Physical Exam Vitals reviewed.  HENT:     Head: Normocephalic.  Cardiovascular:     Rate and Rhythm: Normal rate.     Pulses: Normal pulses.  Pulmonary:     Effort: Pulmonary effort is normal.  Musculoskeletal:     Right lower leg: Edema present.     Left lower leg: Edema present.  Skin:    General: Skin is warm and dry.  Neurological:     Mental Status: She is alert and oriented to person, place, and time.  Psychiatric:        Mood and Affect: Mood normal.        Behavior: Behavior normal.        Thought Content: Thought content normal.        Judgment: Judgment normal.     BP 135/84 (BP Location: Right Arm)   Pulse 68   Resp 18   Ht '5\' 5"'$  (1.651 m)   Wt 135 lb 6.4 oz (61.4 kg)   BMI 22.53 kg/m   Past Medical  History:  Diagnosis Date   Cataracts, bilateral    Chickenpox cataracts   COPD (chronic obstructive pulmonary disease) (HCC)    Depression    Drug addiction ([place])    Headache    Hepatitis C virus infection    Hepatitis C without hepatic coma    History of chicken pox    History of colon polyps    Seizures (HCC)    Stroke [facility])     Social History   Socioeconomic History   Marital status: Married    Spouse name: Not on file   Number of children: 2   Years of education: GED   Highest education level: Not on file  Occupational History   Not on file  Tobacco Use   Smoking status: Former    Packs/day: 0.50    Years: 41.00    Total pack years: 20.50    Types: Cigarettes    Quit date: 10/27/2015    Years since quitting: 6.5   Smokeless tobacco: Never  Vaping Use   Vaping Use: Never used  Substance and Sexual Activity   Alcohol use: No   Drug use: No   Sexual activity: Not Currently  Other Topics Concern   Not on file  Social History Narrative  03/14/21 son with her now, he lives 20 min away   significant other recently passed away   Social Determinants of Health   Financial Resource Strain: Not on file  Food Insecurity: Not on file  Transportation Needs: Not on file  Physical Activity: Not on file  Stress: Not on file  Social Connections: Not on file  Intimate Partner Violence: Not on file    Past Surgical History:  Procedure Laterality Date   APPENDECTOMY     CHOLECYSTECTOMY     COLONOSCOPY WITH PROPOFOL N/A 04/30/2015   Procedure: COLONOSCOPY WITH PROPOFOL;  Surgeon: Josefine Class, MD;  Location: [facility] ENDOSCOPY;  Service: Endoscopy;  Laterality: N/A;   COLONOSCOPY WITH PROPOFOL N/A 07/13/2017   Procedure: COLONOSCOPY WITH PROPOFOL;  Surgeon: Manya Silvas, MD;  Location: [facility] ENDOSCOPY;  Service: Endoscopy;  Laterality: N/A;   ESOPHAGOGASTRODUODENOSCOPY (EGD) WITH PROPOFOL N/A 07/13/2017   Procedure: ESOPHAGOGASTRODUODENOSCOPY (EGD) WITH PROPOFOL;   Surgeon: Manya Silvas, MD;  Location: [facility] ENDOSCOPY;  Service: Endoscopy;  Laterality: N/A;   EYE SURGERY     LOWER EXTREMITY ANGIOGRAPHY Right 01/01/2022   Procedure: Lower Extremity Angiography;  Surgeon: Algernon Huxley, MD;  Location: [place] CV LAB;  Service: Cardiovascular;  Laterality: Right;    Family History  Problem Relation Age of Onset   Breast cancer Paternal Grandmother     Allergies  Allergen Reactions   Sulfasalazine Shortness Of Breath   Aspirin    Darvon [Propoxyphene]    Iodine    Penicillins    Sulfa Antibiotics    Zithromax [Azithromycin]        Latest Ref Rng & Units 09/24/2021   11:06 AM 10/15/2020    7:00 AM 10/12/2020    5:24 AM  CBC  WBC 3.4 - 10.8 x10E3/uL 5.8  5.0  6.8   Hemoglobin 11.1 - 15.9 g/dL 14.9  13.2  12.2   Hematocrit 34.0 - 46.6 % 43.2  40.4  36.9   Platelets 150 - 450 x10E3/uL 220  288  295       CMP     Component Value Date/Time   NA 142 09/24/2021 1106   NA 137 05/09/2012 0748   K 4.4 09/24/2021 1106   K 2.9 (L) 05/09/2012 0748   CL 101 09/24/2021 1106   CL 99 05/09/2012 0748   CO2 26 09/24/2021 1106   CO2 32 05/09/2012 0748   GLUCOSE 86 09/24/2021 1106   GLUCOSE 93 10/15/2020 0700   GLUCOSE 108 (H) 05/09/2012 0748   BUN 18 01/01/2022 0741   BUN 17 09/24/2021 1106   BUN 11 05/09/2012 0748   CREATININE 1.39 (H) 01/01/2022 0741   CREATININE 0.64 05/09/2012 0748   CALCIUM 9.7 09/24/2021 1106   CALCIUM 9.1 05/09/2012 0748   PROT 7.1 09/24/2021 1106   PROT 7.1 05/01/2012 0823   ALBUMIN 4.8 09/24/2021 1106   ALBUMIN 3.9 05/01/2012 0823   AST 21 09/24/2021 1106   AST 13 (L) 05/01/2012 0823   ALT 18 09/24/2021 1106   ALT 21 05/01/2012 0823   ALKPHOS 145 (H) 09/24/2021 1106   ALKPHOS 173 (H) 05/01/2012 0823   BILITOT 1.5 (H) 09/24/2021 1106   BILITOT 1.4 (H) 05/01/2012 0823   GFRNONAA 42 (L) 01/01/2022 0741   GFRNONAA >60 05/09/2012 0748   GFRAA >60 07/25/2017 0444   GFRAA >60 05/09/2012 0748     VAS [place]  ABI WITH/WO TBI  Result Date: 05/11/2022  LOWER EXTREMITY DOPPLER STUDY Patient Name:  Natalie Hoots  [name]  Date of Exam:   05/08/2022 Medical Rec #: [id number]    Accession #:    [id number] Date of Birth: 1955-09-21    Patient Gender: F Patient Age:   54 years Exam Location:  [facility] Procedure:      VAS [place] ABI WITH/WO TBI Referring Phys: Leotis Pain --------------------------------------------------------------------------------  Indications: Peripheral artery disease.  Vascular Interventions: 01/01/2022: Catheter placement into Aorta from Bilateral                         femoral approach. Aortogram and Selective Right Lower                         Extremity Angiogram. PTA of the Aorta and the Right CIA                         with 6 mm diameter Lutonix drug coated balloon to                         predilate the lesions. Stent placement to the Right CIA                         with 8 mm diameter by 37 mm length Lifestream stent.                         Stent placement to the Aorta with 12 mm diameter by 38                         mm length lifestream stent postdialated with a 14 mm                         balloon. Comparison Study: 02/04/2022 Performing Technologist: Almira Coaster RVS  Examination Guidelines: A complete evaluation includes at minimum, Doppler waveform signals and systolic blood pressure reading at the level of bilateral brachial, anterior tibial, and posterior tibial arteries, when vessel segments are accessible. Bilateral testing is considered an integral part of a complete examination. Photoelectric Plethysmograph (PPG) waveforms and toe systolic pressure readings are included as required and additional duplex testing as needed. Limited examinations for reoccurring indications may be performed as noted.  ABI Findings: +---------+------------------+-----+---------+--------+ Right    Rt Pressure (mmHg)IndexWaveform Comment  +---------+------------------+-----+---------+--------+  Brachial 134                                      +---------+------------------+-----+---------+--------+ ATA      151               1.13 triphasic         +---------+------------------+-----+---------+--------+ PTA      149               1.11 triphasic         +---------+------------------+-----+---------+--------+ Great Toe103               0.77 Normal            +---------+------------------+-----+---------+--------+ +---------+------------------+-----+---------+-------+ Left     Lt Pressure (mmHg)IndexWaveform Comment +---------+------------------+-----+---------+-------+ Brachial 132                                     +---------+------------------+-----+---------+-------+  ATA      135               1.01 triphasic        +---------+------------------+-----+---------+-------+ PTA      143               1.07 triphasic        +---------+------------------+-----+---------+-------+ Great Toe100               0.75 Abnormal         +---------+------------------+-----+---------+-------+ +-------+-----------+-----------+------------+------------+ ABI/TBIToday's ABIToday's TBIPrevious ABIPrevious TBI +-------+-----------+-----------+------------+------------+ Right  1.13       .77        1.18        .94          +-------+-----------+-----------+------------+------------+ Left   1.07       .75        1.03        1.07         +-------+-----------+-----------+------------+------------+ Bilateral TBIs appear decreased compared to prior study on 02/04/2022. Bilateral ABIs appear essentially unchanged compared to prior study on 02/04/2022.  Summary: Right: Resting right ankle-brachial index is within normal range. The right toe-brachial index is normal. Left: Resting left ankle-brachial index is within normal range. The left toe-brachial index is normal. *See table(s) above for measurements and observations.  Electronically signed by Leotis Pain MD on  05/11/2022 at 7:15:02 PM.    Final        Assessment & Plan:   1. 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  The patient does not voice lifestyle limiting changes at this point in time.  Noninvasive studies do not suggest clinically significant change.  No invasive studies, angiography or surgery at this time The patient should continue walking and begin a more formal exercise program.  The patient should continue antiplatelet therapy and aggressive treatment of the lipid abnormalities  No changes in the patient's medications at this time  Continued surveillance is indicated as atherosclerosis is likely to progress with time.    The patient will continue follow up with noninvasive studies as ordered.   2. Swelling of limb Discussed with patient the use of conservative therapy.  The patient is advised to use medical grade compression stockings daily.  She should also elevate her lower extremities when not active.  Daily activity is also important with control of lower extremity edema.  Patient will follow-up sooner if issues arise with edema.  3. Stage 3a chronic kidney disease ([place]) This may also contribute to patient's lower extremity edema.   Current Outpatient Medications on File Prior to Visit  Medication Sig Dispense Refill   amLODipine (NORVASC) 5 MG tablet Take 5 mg by mouth daily.     atorvastatin (LIPITOR) 40 MG tablet Take 1 tablet (40 mg total) by mouth daily. 30 tablet 0   clopidogrel (PLAVIX) 75 MG tablet Take 1 tablet (75 mg total) by mouth daily. 30 tablet 0   furosemide (LASIX) 20 MG tablet Take by mouth.     ipratropium-albuterol (DUONEB) 0.5-2.5 (3) MG/3ML SOLN Take by nebulization.     losartan (COZAAR) 25 MG tablet Take by mouth.     methadone (DOLOPHINE) 1 mg/ml oral solution Take 45 mg/kg by mouth daily.     No current  facility-administered medications on file prior to visit.    There are no Patient Instructions on file  for this visit. No follow-ups on file.   Kris Hartmann, NP

## 2022-06-10 NOTE — Progress Notes (Signed)
[facility] [address] [place]. [place] [address] [phone]       STROKE FOLLOW UP NOTE  [name] Date of Birth:  07-Feb-1956 Medical Record Number:  [id number]   Reason for Referral: stroke follow up    SUBJECTIVE:   CHIEF COMPLAINT:  No chief complaint on file.    HPI:   Update 06/11/2022 JM: Patient returns for 65-monthfollow-up.  Reports cognition ***.   Stable from stroke standpoint without new stroke/TIA symptoms.  Compliant on recommended medications.  Routinely follows with PCP.       History provided for reference purposes only Update 12/04/2021 JM: patient returns per request due to memory concerns. Completed MR brain after prior visit for visual concerns but MRI unremarkable for new findings. When she was called for results, she mentioned concerns of her memory worsening since her stroke. At prior visit, she reported her cognition had improved since her stroke and felt to be at baseline.  Today, son believes she has been forgetting peoples names more frequently. Can mix up names or say incorrect words, she will usually realize she says the wrong word and correct herself.  She will have good days and bad days.  This has been persistent since her stroke but believes over the past few months, this has slightly worsened. Will do memory and word games on her tablet but hasn't been doing these exercises more recently. MMSE today 23/30 with similar results back in September and even May 2022.  Compliant on all stroke prevention medications.  Routinely follows with PCP.  No further concerns at this time.  Update 09/23/2021 JM: patient returns for 6 months follow-up accompanied by her son.  Overall stable since prior visit without new stroke/TIA symptoms.  Reports residual occasional word finding difficulty although greatly improved. Cognition also greatly improved and currently at baseline. Son lives with her for 1 week at a time and then his own  place for 1 week and keeps that rotation. She maintains ADLs and majority of IADLs, son helps with bill paying and other assistance as needed. She does mention gradual decline of her vision (see A/P), plans on scheduling f/u visit with ophthalmologist soon.  Compliant on Plavix and atorvastatin, denies side effects.  Blood pressure today 126/77. Routinely monitors at home, typically stable. She has not had recent f/u with PCP.  No further concerns at this time.  Update 03/14/2021 JM: Ms. WSnowballreturns for 423-monthtroke follow-up accompanied by her son.  Overall doing well.  Denies new stroke/TIA symptoms. Reports occasional word finding difficulty but overall improving and good with compensating.  MMSE today 23/30 (prior 22/30 - during stroke admission 12/30). She continues to maintain ADLs and majority of IADLs independently.  Her son does continue to live with her but plans on moving out soon.  She does question return to driving.  Compliant on Plavix and atorvastatin.  Blood pressure today 162/86 -similar on recheck. Routinely monitors at home and typically stable. No new concerns at this time  Initial visit 11/15/2020 JM: [name] being seen for hospital follow-up accompanied by her son.  She was discharged home from CIR on 10/19/2020 after an 8-day stay.  Completed HH SLP on Monday with residual cognitive difficulties but overall recovering well and per son, he believes she has been doing better than her cognitive baseline prior to her stroke.  She continues to routinely do exercises as advised at home. Remains in her own home but her son has  been staying with her.  She is able to maintain ADLs and majority of IADLs independently and son will assist as needed.  MMSE today 22/30 (12/30 during hospitalization).  She had greater difficulty with focusing and concentration as when we would move on to the next question such as place, she continuously tried to go back to time questions.  She has not yet returned  back to driving.  Denies any functional deficits.  Denies new stroke/TIA symptoms  Remains on Plavix and atorvastatin without associated side effects Blood pressure today 141/69 -does not routinely monitor at home is typically stable PCP recently recheck B12 level which was >4000 therefore supplement placed on hold and plans on repeating level in a couple of months Denies any current tobacco use (quit 6 years ago), EtOH or drug use Continued use of home oxygen 3L via [place] as needed Completed 30-day cardiac event monitor which did not show evidence of atrial fibrillation  No further concerns at this time  Stroke admission 09/30/2020 [name] is a 66 y.o. female with history of Cataracts, bilateral, Chickenpox (cataracts), COPD (chronic obstructive pulmonary disease) ([place]), Depression, Drug addiction ([place]), Headache, Hepatitis C virus infection, Hepatitis C without hepatic coma, History of chicken pox, History of colon polyps, and Seizures ([place]) who presented on 09/30/2020 with altered mental status on day of admission.  Personally reviewed hospitalization pertinent progress notes, lab work and imaging with summary provided.  Stroke team evaluated patient on 10/10/2020 after repeat MRI on 4/5 showed acute infarct of left MCA (initial MRI negative).  Concern of embolic pattern and recommended 30 cardiac event monitor outpatient to further evaluate for possible A. fib. Recommended starting plavix with hx of aspirin intolerance. Concern of cognitive impairment with MMSE 12/30 with lack of insight, disorientation and confabulation - B12 85 and initiated supplement. LDL 136 - started atorvastatin '40mg'$  daily. Other stroke risk factors include tobacco use, migraines and questionable EtOH use. Evaluated by therapies and recommended CIR for ongoing therapy needs.   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 Allergy to ASA. Therapy recommendations:  CIR recommended and accepted Disposition:  d/c to CIR on 4/7     ROS:   14 system review of systems performed and negative with exception of those listed in HPI    PMH:  Past Medical History:  Diagnosis Date   Cataracts, bilateral    Chickenpox cataracts   COPD (chronic obstructive pulmonary disease) ([place])    Depression    Drug addiction ([place])    Headache    Hepatitis C virus infection    Hepatitis C without hepatic coma    History of chicken pox    History of colon polyps    Seizures ([place])    Stroke ([place])     PSH:  Past Surgical History:  Procedure Laterality Date   APPENDECTOMY     CHOLECYSTECTOMY     COLONOSCOPY WITH PROPOFOL N/A 04/30/2015   Procedure: COLONOSCOPY WITH PROPOFOL;  Surgeon: Josefine Class, MD;  Location: [facility] ENDOSCOPY;  Service: Endoscopy;  Laterality: N/A;   COLONOSCOPY WITH PROPOFOL N/A 07/13/2017   Procedure: COLONOSCOPY WITH PROPOFOL;  Surgeon: Manya Silvas, MD;  Location:  [facility] ENDOSCOPY;  Service: Endoscopy;  Laterality: N/A;   ESOPHAGOGASTRODUODENOSCOPY (EGD) WITH PROPOFOL N/A 07/13/2017   Procedure: ESOPHAGOGASTRODUODENOSCOPY (EGD) WITH PROPOFOL;  Surgeon: Manya Silvas, MD;  Location: [facility] ENDOSCOPY;  Service: Endoscopy;  Laterality: N/A;   EYE SURGERY     LOWER EXTREMITY ANGIOGRAPHY Right 01/01/2022   Procedure: Lower Extremity Angiography;  Surgeon: Algernon Huxley, MD;  Location: [facility];  Service: Cardiovascular;  Laterality: Right;    Social History:  Social History   Socioeconomic History   Marital status: Married    Spouse name: Not on file   Number of children: 2   Years of education: GED   Highest education level: Not on file   Occupational History   Not on file  Tobacco Use   Smoking status: Former    Packs/day: 0.50    Years: 41.00    Total pack years: 20.50    Types: Cigarettes    Quit date: 10/27/2015    Years since quitting: 6.6   Smokeless tobacco: Never  Vaping Use   Vaping Use: Never used  Substance and Sexual Activity   Alcohol use: No   Drug use: No   Sexual activity: Not Currently  Other Topics Concern   Not on file  Social History Narrative   03/14/21 son with her now, he lives 20 min away   significant other recently passed away   Social Determinants of Health   Financial Resource Strain: Not on file  Food Insecurity: Not on file  Transportation Needs: Not on file  Physical Activity: Not on file  Stress: Not on file  Social Connections: Not on file  Intimate Partner Violence: Not on file    Family History:  Family History  Problem Relation Age of Onset   Breast cancer Paternal Grandmother     Medications:   Current Outpatient Medications on File Prior to Visit  Medication Sig Dispense Refill   amLODipine (NORVASC) 5 MG tablet Take 5 mg by mouth daily.     atorvastatin (LIPITOR) 40 MG tablet Take 1 tablet (40 mg total) by mouth daily. 30 tablet 0   clopidogrel (PLAVIX) 75 MG tablet Take 1 tablet (75 mg total) by mouth daily. 30 tablet 0   furosemide (LASIX) 20 MG tablet Take by mouth.     ipratropium-albuterol (DUONEB) 0.5-2.5 (3) MG/3ML SOLN Take by nebulization.     losartan (COZAAR) 25 MG tablet Take by mouth.     methadone (DOLOPHINE) 1 mg/ml oral solution Take 45 mg/kg by mouth daily.     No current facility-administered medications on file prior to visit.    Allergies:   Allergies  Allergen Reactions   Sulfasalazine Shortness Of Breath   Aspirin    Darvon [Propoxyphene]    Iodine    Penicillins    Sulfa Antibiotics    Zithromax [Azithromycin]       OBJECTIVE:  Physical Exam  There were no vitals filed for this visit.   There is no height or weight  on file to calculate BMI. No results found.   General: Frail very pleasant older appearing than stated age 32 female, seated, in no evident distress Head: head normocephalic and atraumatic.   Neck: supple with no carotid or supraclavicular bruits Cardiovascular: regular rate and rhythm, no murmurs Musculoskeletal: no deformity Skin:  no rash/petichiae Vascular:  Normal pulses all extremities   Neurologic Exam Mental Status: Awake and fully alert.   Unable to appreciate aphasia on today's exam.. Recent memory mildly  impaired and remote memory intact. Attention span, concentration and fund of knowledge appropriate.  Mood and affect appropriate.     12/04/2021   12:57 PM 03/14/2021   11:01 AM 11/15/2020   11:55 AM  MMSE - Mini Mental State Exam  Orientation to time '5 5 3  '$ Orientation to Place '4 3 3  '$ Registration '3 3 3  '$ Attention/ Calculation '1 1 3  '$ Recall '2 2 1  '$ Language- name 2 objects '2 2 2  '$ Language- repeat '1 1 1  '$ Language- follow 3 step command '3 3 3  '$ Language- read & follow direction '1 1 1  '$ Write a sentence '1 1 1  '$ Copy design 0 1 1  Total score '23 23 22   '$ Cranial Nerves: Pupils equal, briskly reactive to light. Extraocular movements full without nystagmus. Visual fields show blurred vision in left periphery bilaterally. Hearing intact. Facial sensation intact. Face, tongue, palate moves normally and symmetrically.  Motor: Normal bulk and tone. Normal strength in all tested extremity muscles Sensory.: intact to touch , pinprick , position and vibratory sensation.  Coordination: Rapid alternating movements normal in all extremities. Finger-to-nose and heel-to-shin performed accurately bilaterally. Gait and Station: Arises from chair without difficulty. Stance is normal. Gait demonstrates normal stride length and balance without use of assistive device.  Difficulty performing tandem walk and heel toe Reflexes: 1+ and symmetric. Toes downgoing.         ASSESSMENT: Natalie [name] is a 66 y.o. year old female presented with altered mental status on 09/30/2020 with initial MRI negative. On 4/5, MRI showed acute infarct of L MCA infarct, embolic secondary to unknown source. Vascular risk factors include HTN, HLD, hx of tobacco use, B12 deficiency and migraines.     PLAN:  Mild cognitive impairment: Relatively stable over the past year.  MMSE today 23/30 (prior 23/30 03/2021).  Discussed importance of routine memory exercises as well as physical exercise into daily regimen. L MCA stroke :  Residual deficit: occasional word finding difficulty.  Reassured recent MRI negative for new stroke or concerning findings.  Encouraged her to restart word exercises which she was previously doing for benefit, also provided additional apps they could try on her tablet. Advised to call office if she wishes to participate in speech/cognitive therapy Cardiac monitor negative for atrial fibrillation Continue clopidogrel 75 mg daily  and atorvastatin  for secondary stroke prevention.   Discussed secondary stroke prevention measures and importance of close PCP follow up for aggressive stroke risk factor management including HTN BP goal<130/90 and HLD with LDL goal<70 Lipid panel 09/2021: LDL 60     Currently scheduled for follow-up visit in August, advised this can be canceled if she remains stable at that time and can plan on follow-up around November (6 months from today)    CC:  PCP: Sharyne Peach, MD    I spent 37 minutes of face-to-face and non-face-to-face time with patient and son.  This included previsit chart review, discussion and education regarding prior stroke, residual deficits, memory and speech concerns, review and discussion of MMSE, education regarding secondary stroke prevention measures and answered all other questions to patient and sons satisfaction  Frann Rider, AGNP-BC  [facility] [address] [place], [place]  [address]  Phone [phone] Fax [phone] Note: This document was prepared with digital dictation and possible smart phrase technology. Any transcriptional errors that result from this process are unintentional.

## 2022-06-11 ENCOUNTER — Ambulatory Visit (INDEPENDENT_AMBULATORY_CARE_PROVIDER_SITE_OTHER): Payer: BC Managed Care – PPO | Admitting: Adult Health

## 2022-06-11 ENCOUNTER — Encounter: Payer: Self-pay | Admitting: Adult Health

## 2022-06-11 VITALS — BP 142/77 | HR 69 | Ht 65.0 in | Wt 137.0 lb

## 2022-06-11 DIAGNOSIS — I63412 Cerebral infarction due to embolism of left middle cerebral artery: Secondary | ICD-10-CM | POA: Diagnosis not present

## 2022-06-11 DIAGNOSIS — I69319 Unspecified symptoms and signs involving cognitive functions following cerebral infarction: Secondary | ICD-10-CM | POA: Diagnosis not present

## 2022-06-11 NOTE — Patient Instructions (Addendum)
Your Plan:  Continue current treatment plan  If your memory starts to worsen, we can consider starting Namenda to help slow memory decline     Follow up in 8 months or call earlier if needed     Thank you for coming to see [place] at [facility]. I hope we have been able to provide you high quality care today.  You may receive a patient satisfaction survey over the next few weeks. We would appreciate your feedback and comments so that we may continue to improve ourselves and the health of our patients.

## 2022-10-14 ENCOUNTER — Other Ambulatory Visit: Payer: Self-pay

## 2022-10-14 ENCOUNTER — Emergency Department (HOSPITAL_COMMUNITY): Payer: BC Managed Care – PPO

## 2022-10-14 ENCOUNTER — Emergency Department (HOSPITAL_COMMUNITY)
Admission: EM | Admit: 2022-10-14 | Discharge: 2022-10-14 | Disposition: A | Discharge status: Home / Self Care | Payer: BC Managed Care – PPO | Attending: Emergency Medicine | Admitting: Emergency Medicine

## 2022-10-14 ENCOUNTER — Encounter (HOSPITAL_COMMUNITY): Payer: Self-pay

## 2022-10-14 DIAGNOSIS — Z7902 Long term (current) use of antithrombotics/antiplatelets: Secondary | ICD-10-CM | POA: Insufficient documentation

## 2022-10-14 DIAGNOSIS — J449 Chronic obstructive pulmonary disease, unspecified: Secondary | ICD-10-CM | POA: Insufficient documentation

## 2022-10-14 DIAGNOSIS — R0902 Hypoxemia: Secondary | ICD-10-CM | POA: Diagnosis not present

## 2022-10-14 DIAGNOSIS — Z1152 Encounter for screening for COVID-19: Secondary | ICD-10-CM | POA: Diagnosis not present

## 2022-10-14 DIAGNOSIS — R0602 Shortness of breath: Secondary | ICD-10-CM | POA: Insufficient documentation

## 2022-10-14 DIAGNOSIS — J439 Emphysema, unspecified: Secondary | ICD-10-CM | POA: Insufficient documentation

## 2022-10-14 DIAGNOSIS — Z87891 Personal history of nicotine dependence: Secondary | ICD-10-CM | POA: Insufficient documentation

## 2022-10-14 DIAGNOSIS — Z0279 Encounter for issue of other medical certificate: Secondary | ICD-10-CM | POA: Diagnosis present

## 2022-10-14 LAB — CBC WITH DIFFERENTIAL/PLATELET
Abs Immature Granulocytes: 0.02 10*3/uL (ref 0.00–0.07)
Basophils Absolute: 0 10*3/uL (ref 0.0–0.1)
Basophils Relative: 0 %
Eosinophils Absolute: 0 10*3/uL (ref 0.0–0.5)
Eosinophils Relative: 0 %
HCT: 44.3 % (ref 36.0–46.0)
Hemoglobin: 14.8 g/dL (ref 12.0–15.0)
Immature Granulocytes: 0 %
Lymphocytes Relative: 10 %
Lymphs Abs: 0.6 10*3/uL — ABNORMAL LOW (ref 0.7–4.0)
MCH: 31.1 pg (ref 26.0–34.0)
MCHC: 33.4 g/dL (ref 30.0–36.0)
MCV: 93.1 fL (ref 80.0–100.0)
Monocytes Absolute: 0.4 10*3/uL (ref 0.1–1.0)
Monocytes Relative: 6 %
Neutro Abs: 5.2 10*3/uL (ref 1.7–7.7)
Neutrophils Relative %: 84 %
Platelets: 192 10*3/uL (ref 150–400)
RBC: 4.76 MIL/uL (ref 3.87–5.11)
RDW: 14 % (ref 11.5–15.5)
WBC: 6.2 10*3/uL (ref 4.0–10.5)
nRBC: 0 % (ref 0.0–0.2)

## 2022-10-14 LAB — BASIC METABOLIC PANEL
Anion gap: 11 (ref 5–15)
BUN: 14 mg/dL (ref 8–23)
CO2: 26 mmol/L (ref 22–32)
Calcium: 9.4 mg/dL (ref 8.9–10.3)
Chloride: 101 mmol/L (ref 98–111)
Creatinine, Ser: 1.23 mg/dL — ABNORMAL HIGH (ref 0.44–1.00)
GFR, Estimated: 49 mL/min — ABNORMAL LOW (ref >60)
Glucose, Bld: 134 mg/dL — ABNORMAL HIGH (ref 70–99)
Potassium: 3.7 mmol/L (ref 3.5–5.1)
Sodium: 138 mmol/L (ref 135–145)

## 2022-10-14 LAB — RESP PANEL BY RT-PCR (RSV, FLU A&B, COVID)  RVPGX2
Influenza A by PCR: NEGATIVE
Influenza B by PCR: NEGATIVE
Resp Syncytial Virus by PCR: NEGATIVE
SARS Coronavirus 2 by RT PCR: NEGATIVE

## 2022-10-14 LAB — I-STAT VENOUS BLOOD GAS, ED
Acid-Base Excess: 2 mmol/L (ref 0.0–2.0)
Bicarbonate: 27.3 mmol/L (ref 20.0–28.0)
Calcium, Ion: 1.13 mmol/L — ABNORMAL LOW (ref 1.15–1.40)
HCT: 45 % (ref 36.0–46.0)
Hemoglobin: 15.3 g/dL — ABNORMAL HIGH (ref 12.0–15.0)
O2 Saturation: 52 %
Potassium: 3.8 mmol/L (ref 3.5–5.1)
Sodium: 139 mmol/L (ref 135–145)
TCO2: 29 mmol/L (ref 22–32)
pCO2, Ven: 45.3 mmHg (ref 44–60)
pH, Ven: 7.387 (ref 7.25–7.43)
pO2, Ven: 28 mmHg — CL (ref 32–45)

## 2022-10-14 LAB — TROPONIN I (HIGH SENSITIVITY): Troponin I (High Sensitivity): 5 ng/L (ref <18)

## 2022-10-14 NOTE — Discharge Instructions (Signed)
Thank you for letting us take care of you today.  Overall, your workup was very reassuring.  Your labs were stable, x-ray showed emphysema with no acute findings, viral swabs were negative, EKG and cardiac enzymes are negative.  We do not see any new findings related to your heart or lungs and your exam is reassuring as well.  You have been able to maintain normal oxygen saturations in the emergency department.  With reviewing her findings, I do believe you are stable to be discharged home.  Please continue to use your home oxygen as needed.  You may return to your methadone clinic as scheduled.  If you develop any new or concerning symptoms such as chest pain, shortness of breath, lightheadedness, dizziness, loss of consciousness, or other new, concerning symptoms, please return to the nearest emergency department for reevaluation.

## 2022-10-14 NOTE — ED Notes (Addendum)
States, "feel fine, feel normal, did not use my inhaler this am b/c I was going right back home after clinic", alert, NAD, calm, resps e/u, to xray via strtcher

## 2022-10-14 NOTE — ED Triage Notes (Addendum)
Pt arrived POV from the methadone clinic where they told her she needed to come and be evaluated because her oxygen was 83% on RA. Pt denies feeling SHOB. Pt denies any pain in her chest. Pt does state she has COPD.

## 2022-10-14 NOTE — ED Notes (Signed)
Back from b/r, steady gait. Remains alert, NAD, calm, denies sx or complaints, questions or needs. Denies pain or sob.

## 2022-10-14 NOTE — ED Provider Notes (Signed)
[place] EMERGENCY DEPARTMENT AT [facility] Provider Note   CSN: [id number] Arrival date & time: 10/14/22  0930     History  Chief Complaint  Patient presents with   Medical Clearance    Natalie [name] is a 67 y.o. [name] with past medical history COPD and opioid use disorder who presents to the ED for medical clearance after evaluation at her methadone clinic this morning found an oxygen saturation in the low 80s.  Patient states that she is having no symptoms and denies chest pain, shortness of breath, dizziness, lightheadedness, syncope, cough, congestion, fever, chills, nausea, vomiting, abdominal pain, leg pain or swelling, or other symptoms.  She reports that she was diagnosed with COPD 2 years ago and intermittently uses 2 L nasal cannula oxygen.  She was not on oxygen when oxygen saturation was obtained.  On arrival to ED she has oxygen saturation varying from 90-97% on room air speaking in full sentences without complaint. No history of heart failure, heart disease, or DVT/PE. No recent travel, recent surgery, increased sedentary periods, or other risk factors for DVT identified. Pt states she feels like she is in her typical state of health. She has been going to methadone clinic for over 30 years and has not used opiates in many years. Former tobacco user but also quit this many years ago.       Home Medications Prior to Admission medications   Medication Sig Start Date End Date Taking? Authorizing Provider  amLODipine (NORVASC) 5 MG tablet Take 5 mg by mouth daily. 09/06/21   [provider]  atorvastatin (LIPITOR) 40 MG tablet Take 1 tablet (40 mg total) by mouth daily. 10/19/20   Love, Evlyn Kanner, PA-C  clopidogrel (PLAVIX) 75 MG tablet Take 1 tablet (75 mg total) by mouth daily. 10/19/20   Love, Evlyn Kanner, PA-C  furosemide (LASIX) 20 MG tablet Take by mouth. 04/08/22 04/08/23  [provider]  ipratropium-albuterol (DUONEB) 0.5-2.5 (3) MG/3ML SOLN Take by  nebulization. 12/27/21   [provider]  losartan (COZAAR) 25 MG tablet Take by mouth. 04/08/22 04/08/23  [provider]  methadone (DOLOPHINE) 1 mg/ml oral solution Take 45 mg/kg by mouth daily.    [provider]      Allergies    Sulfasalazine, Aspirin, Darvon [propoxyphene], Iodine, Penicillins, Sulfa antibiotics, and Zithromax [azithromycin]    Review of Systems   Review of Systems  All other systems reviewed and are negative.   Physical Exam Updated Vital Signs BP (!) 175/90   Pulse 73   Temp 98.3 F (36.8 C) (Oral)   Resp 19   Ht 5\' 5"  (1.651 m)   Wt 63.5 kg   SpO2 94%   BMI 23.30 kg/m  Physical Exam Vitals and nursing note reviewed.  Constitutional:      General: She is not in acute distress.    Appearance: Normal appearance. She is not ill-appearing, toxic-appearing or diaphoretic.  HENT:     Head: Normocephalic and atraumatic.     Nose: Nose normal.     Mouth/Throat:     Mouth: Mucous membranes are moist.     Pharynx: Oropharynx is clear. No oropharyngeal exudate or posterior oropharyngeal erythema.  Eyes:     General: No scleral icterus.    Extraocular Movements: Extraocular movements intact.     Conjunctiva/sclera: Conjunctivae normal.     Pupils: Pupils are equal, round, and reactive to light.  Cardiovascular:     Rate and Rhythm: Normal rate  and regular rhythm.     Heart sounds: No murmur heard.    No gallop.  Pulmonary:     Effort: Pulmonary effort is normal. No respiratory distress.     Breath sounds: Normal breath sounds. No stridor. No wheezing, rhonchi or rales.  Chest:     Chest wall: No tenderness.  Abdominal:     General: Abdomen is flat. There is no distension.     Palpations: Abdomen is soft. There is no mass.     Tenderness: There is no abdominal tenderness. There is no guarding or rebound.  Musculoskeletal:        General: Normal range of motion.     Cervical back: Normal range of motion and neck supple. No  rigidity.     Right lower leg: No edema.     Left lower leg: No edema.     Comments: No calf tenderness bilaterally  Skin:    General: Skin is warm and dry.     Capillary Refill: Capillary refill takes less than 2 seconds.  Neurological:     General: No focal deficit present.     Mental Status: She is alert and oriented to person, place, and time.     Cranial Nerves: No cranial nerve deficit.     Motor: No weakness.  Psychiatric:        Mood and Affect: Mood normal.        Behavior: Behavior normal.     ED Results / Procedures / Treatments   Labs (all labs ordered are listed, but only abnormal results are displayed) Labs Reviewed  BASIC METABOLIC PANEL - Abnormal; Notable for the following components:      Result Value   Glucose, Bld 134 (*)    Creatinine, Ser 1.23 (*)    GFR, Estimated 49 (*)    All other components within normal limits  CBC WITH DIFFERENTIAL/PLATELET - Abnormal; Notable for the following components:   Lymphs Abs 0.6 (*)    All other components within normal limits  I-STAT VENOUS BLOOD GAS, ED - Abnormal; Notable for the following components:   pO2, Ven 28 (*)    Calcium, Ion 1.13 (*)    Hemoglobin 15.3 (*)    All other components within normal limits  RESP PANEL BY RT-PCR (RSV, FLU A&B, COVID)  RVPGX2  TROPONIN I (HIGH SENSITIVITY)    EKG EKG Interpretation  Date/Time:  Tuesday October 14 2022 10:07:02 EDT Ventricular Rate:  71 PR Interval:  155 QRS Duration: 95 QT Interval:  445 QTC Calculation: 484 R Axis:   47 Text Interpretation: Sinus rhythm No significant change since last tracing Confirmed by Alvira Monday ([id number]) on 10/14/2022 10:16:22 AM  Radiology DG Chest 2 View  Result Date: 10/14/2022 CLINICAL DATA:  Hypoxia. EXAM: CHEST - 2 VIEW COMPARISON:  Chest radiographs 10/03/2020, 09/28/2020, 07/24/2017; CT chest 02/26/2016 FINDINGS: Cardiac silhouette is mildly enlarged. Moderate calcification within the aortic arch. Mediastinal contours  are within normal limits. Flattening of the diaphragms and hyperinflation. There is increased lucency within the bilateral upper lungs consistent with the high-grade centrilobular emphysematous changes seen on prior CT. There is likely chronic curvilinear scarring within the far posteroinferior costophrenic angle, similar to prior CT. No acute airspace opacity to indicate pneumonia. No pleural effusion pneumothorax. Mild multilevel degenerative disc changes of the thoracic spine. IMPRESSION: 1. Hyperinflation and chronic emphysematous changes. 2. No acute lung process. Electronically Signed   By: Neita Garnet M.D.   On: 10/14/2022 10:39  Procedures Procedures    Medications Ordered in ED Medications - No data to display  ED Course/ Medical Decision Making/ A&P                             Medical Decision Making Amount and/or Complexity of Data Reviewed Labs: ordered. Decision-making details documented in ED Course. Radiology: ordered. Decision-making details documented in ED Course. ECG/medicine tests: ordered. Decision-making details documented in ED Course.   Medical Decision Making:   Natalie [name] is a 67 y.o. [name] who presented to the ED today with hypoxia detailed above.    Patient's presentation is complicated by their history of COPD.  Patient placed on continuous vitals and telemetry monitoring while in ED which was reviewed periodically.  Complete initial physical exam performed, notably the patient  was in no acute distress.  Lungs clear to auscultation.  No increased respiratory effort, no retractions, no stridor, no signs of respiratory distress.  Regular rate and rhythm. Neurologically intact. No lower extremity edema or tenderness. Pt with oxygen saturation ranging from 90-97% on room air, speaking in full sentences with no symptoms or signs of respiratory distress. Abdomen soft, nontender, nondistended.     Reviewed and confirmed nursing documentation for past medical  history, family history, social history.    Initial Assessment:   With the patient's presentation of hypoxia, most likely diagnosis is COPD. The differential diagnosis for shortness of breath includes but is not limited to Cardiac (AHF, pericardial effusion and tamponade, arrhythmias, ischemia, etc) Respiratory (COPD, asthma, pneumonia, pneumothorax, primary pulmonary hypertension, PE/VQ mismatch) Hematological (anemia) Neuromuscular (ALS, Guillain-Barr, etc)  This is most consistent with an acute complicated illness  Initial Plan:  Screening labs including CBC and Metabolic panel to evaluate for infectious or metabolic etiology of disease Viral swabs and venous blood gas to assess etiology of possible hypoxia.  CXR to evaluate for structural/infectious intrathoracic pathology.  EKG and troponin to evaluate for cardiac pathology Objective evaluation as reviewed   Initial Study Results:   Laboratory  All laboratory results reviewed without evidence of clinically relevant pathology.   Exceptions include: Creatinine 1.23 similar to baseline, GFR 49  EKG EKG was reviewed independently. ST segments without concerns for elevations.   EKG: normal EKG, normal sinus rhythm, no significant change from previous tracing fluid.   Radiology:  All images reviewed independently. Agree with radiology report at this time.   DG Chest 2 View  Result Date: 10/14/2022 CLINICAL DATA:  Hypoxia. EXAM: CHEST - 2 VIEW COMPARISON:  Chest radiographs 10/03/2020, 09/28/2020, 07/24/2017; CT chest 02/26/2016 FINDINGS: Cardiac silhouette is mildly enlarged. Moderate calcification within the aortic arch. Mediastinal contours are within normal limits. Flattening of the diaphragms and hyperinflation. There is increased lucency within the bilateral upper lungs consistent with the high-grade centrilobular emphysematous changes seen on prior CT. There is likely chronic curvilinear scarring within the far posteroinferior  costophrenic angle, similar to prior CT. No acute airspace opacity to indicate pneumonia. No pleural effusion pneumothorax. Mild multilevel degenerative disc changes of the thoracic spine. IMPRESSION: 1. Hyperinflation and chronic emphysematous changes. 2. No acute lung process. Electronically Signed   By: Neita Garnet M.D.   On: 10/14/2022 10:39      Final Assessment and Plan:   This is a 67 year old [name] with past medical history of COPD and opioid use disorder who presents to the ED from methadone clinic following a measurement of hypoxia with O2 sat in the low  5080s.  Patient denies any symptoms at all today.  She states that she does use 2 L of nasal cannula oxygen as needed.  This measurement was obtained on room air.  On exam, patient has no signs of respiratory distress.  Lungs clear to auscultation.  On initial assessment her O2 does range from approximately 90% to 97% on room air but again no signs of respiratory distress.  On multiple reassessments, patient has oxygen saturation EKG completed with minimum of 94% ranging up to 97% and still is expressing no symptoms.  Chest x-ray with emphysematous changes but no acute findings.  EKG normal sinus rhythm without acute ST-T changes.  Negative troponin.  Normal pH, otherwise unremarkable blood gas.  Viral swabs negative.  Creatinine at baseline.  No leukocytosis, no anemia, no significant electrolyte disturbance, otherwise lab work is also unremarkable.  Patient doing well on final reexamination.  Patient desires to go home.  She states that she has plenty of home oxygen available for use as needed.  Do believe this is reasonable discussed with patient plan to discharge home and need to use oxygen per guidelines set by her outpatient team.  Patient will set up follow-up appointment with methadone clinic to obtain her medication.  Patient given strict ED return precautions, all questions answered, and stable for discharge.   Clinical Impression:  1.  Pulmonary emphysema, unspecified emphysema type   2. Hypoxia      Discharge           Final Clinical Impression(s) / ED Diagnoses Final diagnoses:  Hypoxia  Pulmonary emphysema, unspecified emphysema type    Rx / DC Orders ED Discharge Orders     None         Tonette LedererGowens, Khady Vandenberg L, PA-C 10/14/22 1250    Alvira MondaySchlossman, Erin, MD 10/14/22 2328

## 2022-11-03 ENCOUNTER — Other Ambulatory Visit (INDEPENDENT_AMBULATORY_CARE_PROVIDER_SITE_OTHER): Payer: Self-pay | Admitting: Nurse Practitioner

## 2022-11-03 DIAGNOSIS — I70221 Atherosclerosis of native arteries of extremities with rest pain, right leg: Secondary | ICD-10-CM

## 2022-11-06 ENCOUNTER — Ambulatory Visit (INDEPENDENT_AMBULATORY_CARE_PROVIDER_SITE_OTHER): Payer: Medicare HMO

## 2022-11-06 ENCOUNTER — Encounter (INDEPENDENT_AMBULATORY_CARE_PROVIDER_SITE_OTHER): Payer: Self-pay | Admitting: Nurse Practitioner

## 2022-11-06 ENCOUNTER — Ambulatory Visit (INDEPENDENT_AMBULATORY_CARE_PROVIDER_SITE_OTHER): Payer: Medicare HMO | Admitting: Nurse Practitioner

## 2022-11-06 VITALS — BP 178/79 | HR 65 | Resp 16 | Wt 133.2 lb

## 2022-11-06 DIAGNOSIS — M7989 Other specified soft tissue disorders: Secondary | ICD-10-CM | POA: Diagnosis not present

## 2022-11-06 DIAGNOSIS — I70221 Atherosclerosis of native arteries of extremities with rest pain, right leg: Secondary | ICD-10-CM | POA: Diagnosis not present

## 2022-11-06 DIAGNOSIS — N1831 Chronic kidney disease, stage 3a: Secondary | ICD-10-CM

## 2022-11-07 ENCOUNTER — Encounter (INDEPENDENT_AMBULATORY_CARE_PROVIDER_SITE_OTHER): Payer: Self-pay | Admitting: Nurse Practitioner

## 2022-11-07 NOTE — Progress Notes (Signed)
Subjective:    Patient ID: Lajuan Lines, female    DOB: [birth year], 67 y.o.   MRN: [id number] Chief Complaint  Patient presents with  . Follow-up    Ultrasound follow up     The patient returns to the office for followup and review of the noninvasive studies.   There have been no interval changes in lower extremity symptoms. No interval shortening of the patient's claudication distance or development of rest pain symptoms. No new ulcers or wounds have occurred since the last visit.  The patient does complain of having some lower extremity edema worsened from her previous visit.  There have been no significant changes to the patient's overall health care.  The patient denies amaurosis fugax or recent TIA symptoms. There are no documented recent neurological changes noted. There is no history of DVT, PE or superficial thrombophlebitis. The patient denies recent episodes of angina or shortness of breath.   ABI Rt=1.07 and Lt=1.05  (previous ABI's Rt=1.13 and Lt=1.07) Duplex ultrasound of the triphasic tibial artery waveforms bilaterally with good toe waveforms bilaterally.   Review of Systems  All other systems reviewed and are negative.      Objective:   Physical Exam Vitals reviewed.  HENT:     Head: Normocephalic.  Cardiovascular:     Rate and Rhythm: Normal rate.     Pulses: Normal pulses.  Pulmonary:     Effort: Pulmonary effort is normal.  Skin:    General: Skin is warm and dry.  Neurological:     Mental Status: She is alert and oriented to person, place, and time.  Psychiatric:        Mood and Affect: Mood normal.        Behavior: Behavior normal.        Thought Content: Thought content normal.        Judgment: Judgment normal.    BP (!) 178/79 (BP Location: Left Arm)   Pulse 65   Resp 16   Wt 133 lb 3.2 oz (60.4 kg)   BMI 22.17 kg/m   Past Medical History:  Diagnosis Date  . Cataracts, bilateral   . Chickenpox cataracts  . COPD (chronic obstructive  pulmonary disease) (HCC)   . Depression   . Drug addiction (HCC)   . Headache   . Hepatitis C virus infection   . Hepatitis C without hepatic coma   . History of chicken pox   . History of colon polyps   . Seizures (HCC)   . Stroke [facility])     Social History   Socioeconomic History  . Marital status: Married    Spouse name: Not on file  . Number of children: 2  . Years of education: GED  . Highest education level: Not on file  Occupational History  . Not on file  Tobacco Use  . Smoking status: Former    Packs/day: 0.50    Years: 41.00    Additional pack years: 0.00    Total pack years: 20.50    Types: Cigarettes    Quit date: 10/27/2015    Years since quitting: 7.0  . Smokeless tobacco: Never  Vaping Use  . Vaping Use: Never used  Substance and Sexual Activity  . Alcohol use: No  . Drug use: No  . Sexual activity: Not Currently  Other Topics Concern  . Not on file  Social History Narrative   03/14/21 son with her now, he lives 20 min away   significant other recently  passed away   Social Determinants of Health   Financial Resource Strain: Not on file  Food Insecurity: Not on file  Transportation Needs: Not on file  Physical Activity: Not on file  Stress: Not on file  Social Connections: Not on file  Intimate Partner Violence: Not on file    Past Surgical History:  Procedure Laterality Date  . APPENDECTOMY    . CHOLECYSTECTOMY    . COLONOSCOPY WITH PROPOFOL N/A 04/30/2015   Procedure: COLONOSCOPY WITH PROPOFOL;  Surgeon: Elnita Maxwell, MD;  Location: [facility] ENDOSCOPY;  Service: Endoscopy;  Laterality: N/A;  . COLONOSCOPY WITH PROPOFOL N/A 07/13/2017   Procedure: COLONOSCOPY WITH PROPOFOL;  Surgeon: Scot Jun, MD;  Location: [facility] ENDOSCOPY;  Service: Endoscopy;  Laterality: N/A;  . ESOPHAGOGASTRODUODENOSCOPY (EGD) WITH PROPOFOL N/A 07/13/2017   Procedure: ESOPHAGOGASTRODUODENOSCOPY (EGD) WITH PROPOFOL;  Surgeon: Scot Jun, MD;  Location: [facility]  ENDOSCOPY;  Service: Endoscopy;  Laterality: N/A;  . EYE SURGERY    . LOWER EXTREMITY ANGIOGRAPHY Right 01/01/2022   Procedure: Lower Extremity Angiography;  Surgeon: Annice Needy, MD;  Location: [facility] INVASIVE CV LAB;  Service: Cardiovascular;  Laterality: Right;    Family History  Problem Relation Age of Onset  . Breast cancer Paternal Grandmother     Allergies  Allergen Reactions  . Sulfasalazine Shortness Of Breath  . Aspirin   . Darvon [Propoxyphene]   . Iodine   . Penicillins   . Sulfa Antibiotics   . Zithromax [Azithromycin]        Latest Ref Rng & Units 10/14/2022   10:24 AM 10/14/2022    9:55 AM 09/24/2021   11:06 AM  CBC  WBC 4.0 - 10.5 K/uL  6.2  5.8   Hemoglobin 12.0 - 15.0 g/dL 65.7  84.6  96.2   Hematocrit 36.0 - 46.0 % 45.0  44.3  43.2   Platelets 150 - 400 K/uL  192  220       CMP     Component Value Date/Time   NA 139 10/14/2022 1024   NA 142 09/24/2021 1106   NA 137 05/09/2012 0748   K 3.8 10/14/2022 1024   K 2.9 (L) 05/09/2012 0748   CL 101 10/14/2022 0955   CL 99 05/09/2012 0748   CO2 26 10/14/2022 0955   CO2 32 05/09/2012 0748   GLUCOSE 134 (H) 10/14/2022 0955   GLUCOSE 108 (H) 05/09/2012 0748   BUN 14 10/14/2022 0955   BUN 17 09/24/2021 1106   BUN 11 05/09/2012 0748   CREATININE 1.23 (H) 10/14/2022 0955   CREATININE 0.64 05/09/2012 0748   CALCIUM 9.4 10/14/2022 0955   CALCIUM 9.1 05/09/2012 0748   PROT 7.1 09/24/2021 1106   PROT 7.1 05/01/2012 0823   ALBUMIN 4.8 09/24/2021 1106   ALBUMIN 3.9 05/01/2012 0823   AST 21 09/24/2021 1106   AST 13 (L) 05/01/2012 0823   ALT 18 09/24/2021 1106   ALT 21 05/01/2012 0823   ALKPHOS 145 (H) 09/24/2021 1106   ALKPHOS 173 (H) 05/01/2012 0823   BILITOT 1.5 (H) 09/24/2021 1106   BILITOT 1.4 (H) 05/01/2012 0823   GFRNONAA 49 (L) 10/14/2022 0955   GFRNONAA >60 05/09/2012 0748   GFRAA >60 07/25/2017 0444   GFRAA >60 05/09/2012 0748     VAS [place] ABI WITH/WO TBI  Result Date: 05/11/2022  LOWER  EXTREMITY DOPPLER STUDY Patient Name:  JAQUALA WHOOLERY  Date of Exam:   05/08/2022 Medical Rec #: [id number]    Accession #:  [id number] Date of Birth: [birth year]    Patient Gender: F Patient Age:   67 years Exam Location:  [facility] Procedure:      VAS [place] ABI WITH/WO TBI Referring Phys: Festus Barren --------------------------------------------------------------------------------  Indications: Peripheral artery disease.  Vascular Interventions: 01/01/2022: Catheter placement into Aorta from Bilateral                         femoral approach. Aortogram and Selective Right Lower                         Extremity Angiogram. PTA of the Aorta and the Right CIA                         with 6 mm diameter Lutonix drug coated balloon to                         predilate the lesions. Stent placement to the Right CIA                         with 8 mm diameter by 37 mm length Lifestream stent.                         Stent placement to the Aorta with 12 mm diameter by 38                         mm length lifestream stent postdialated with a 14 mm                         balloon. Comparison Study: 02/04/2022 Performing Technologist: Debbe Bales RVS  Examination Guidelines: A complete evaluation includes at minimum, Doppler waveform signals and systolic blood pressure reading at the level of bilateral brachial, anterior tibial, and posterior tibial arteries, when vessel segments are accessible. Bilateral testing is considered an integral part of a complete examination. Photoelectric Plethysmograph (PPG) waveforms and toe systolic pressure readings are included as required and additional duplex testing as needed. Limited examinations for reoccurring indications may be performed as noted.  ABI Findings: +---------+------------------+-----+---------+--------+ Right    Rt Pressure (mmHg)IndexWaveform Comment  +---------+------------------+-----+---------+--------+ Brachial 134                                       +---------+------------------+-----+---------+--------+ ATA      151               1.13 triphasic         +---------+------------------+-----+---------+--------+ PTA      149               1.11 triphasic         +---------+------------------+-----+---------+--------+ Great Toe103               0.77 Normal            +---------+------------------+-----+---------+--------+ +---------+------------------+-----+---------+-------+ Left     Lt Pressure (mmHg)IndexWaveform Comment +---------+------------------+-----+---------+-------+ Brachial 132                                     +---------+------------------+-----+---------+-------+ ATA  135               1.01 triphasic        +---------+------------------+-----+---------+-------+ PTA      143               1.07 triphasic        +---------+------------------+-----+---------+-------+ Great Toe100               0.75 Abnormal         +---------+------------------+-----+---------+-------+ +-------+-----------+-----------+------------+------------+ ABI/TBIToday's ABIToday's TBIPrevious ABIPrevious TBI +-------+-----------+-----------+------------+------------+ Right  1.13       .77        1.18        .94          +-------+-----------+-----------+------------+------------+ Left   1.07       .75        1.03        1.07         +-------+-----------+-----------+------------+------------+ Bilateral TBIs appear decreased compared to prior study on 02/04/2022. Bilateral ABIs appear essentially unchanged compared to prior study on 02/04/2022.  Summary: Right: Resting right ankle-brachial index is within normal range. The right toe-brachial index is normal. Left: Resting left ankle-brachial index is within normal range. The left toe-brachial index is normal. *See table(s) above for measurements and observations.  Electronically signed by Festus Barren MD on 05/11/2022 at 7:15:02 PM.    Final        Assessment &  Plan:   1. 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  The patient does not voice lifestyle limiting changes at this point in time.  Noninvasive studies do not suggest clinically significant change.  No invasive studies, angiography or surgery at this time The patient should continue walking and begin a more formal exercise program.  The patient should continue antiplatelet therapy and aggressive treatment of the lipid abnormalities  No changes in the patient's medications at this time  Continued surveillance is indicated as atherosclerosis is likely to progress with time.    The patient will continue follow up with noninvasive studies as ordered.   2. Swelling of limb This is doing very well today, patient should continue with conservative therapy including use of medical grade compression stockings, elevation and activity.   3. Stage 3a chronic kidney disease (HCC) This may also contribute to patient's lower extremity edema.   Current Outpatient Medications on File Prior to Visit  Medication Sig Dispense Refill  . amLODipine (NORVASC) 5 MG tablet Take 5 mg by mouth daily.    Marland Kitchen atorvastatin (LIPITOR) 40 MG tablet Take 1 tablet (40 mg total) by mouth daily. 30 tablet 0  . atorvastatin (LIPITOR) 40 MG tablet Take 40 mg by mouth daily.    . clopidogrel (PLAVIX) 75 MG tablet Take 1 tablet (75 mg total) by mouth daily. 30 tablet 0  . furosemide (LASIX) 20 MG tablet Take by mouth.    Marland Kitchen ipratropium-albuterol (DUONEB) 0.5-2.5 (3) MG/3ML SOLN Take by nebulization.    Marland Kitchen losartan (COZAAR) 25 MG tablet Take by mouth.    . methadone (DOLOPHINE) 1 mg/ml oral solution Take 47 mg/kg by mouth daily.     No current facility-administered medications on file prior to visit.    There are no Patient Instructions on file for this visit. No follow-ups on  file.   Georgiana Spinner, NP

## 2022-11-10 LAB — VAS US ABI WITH/WO TBI
Left ABI: 1.05
Right ABI: 1.07

## 2022-11-11 ENCOUNTER — Ambulatory Visit: Payer: Medicare HMO | Admitting: Podiatry

## 2022-11-12 ENCOUNTER — Emergency Department: Payer: BC Managed Care – PPO

## 2022-11-12 ENCOUNTER — Inpatient Hospital Stay
Admission: EM | Admit: 2022-11-12 | Discharge: 2022-11-18 | DRG: 177 | Disposition: A | Discharge status: Home Health | Payer: BC Managed Care – PPO | Attending: Internal Medicine | Admitting: Internal Medicine

## 2022-11-12 ENCOUNTER — Telehealth: Payer: Self-pay | Admitting: Adult Health

## 2022-11-12 ENCOUNTER — Other Ambulatory Visit: Payer: Self-pay

## 2022-11-12 DIAGNOSIS — Z9049 Acquired absence of other specified parts of digestive tract: Secondary | ICD-10-CM

## 2022-11-12 DIAGNOSIS — F32A Depression, unspecified: Secondary | ICD-10-CM | POA: Diagnosis present

## 2022-11-12 DIAGNOSIS — E876 Hypokalemia: Secondary | ICD-10-CM | POA: Diagnosis present

## 2022-11-12 DIAGNOSIS — Z87891 Personal history of nicotine dependence: Secondary | ICD-10-CM

## 2022-11-12 DIAGNOSIS — Z881 Allergy status to other antibiotic agents status: Secondary | ICD-10-CM | POA: Diagnosis not present

## 2022-11-12 DIAGNOSIS — J9621 Acute and chronic respiratory failure with hypoxia: Secondary | ICD-10-CM | POA: Diagnosis present

## 2022-11-12 DIAGNOSIS — I10 Essential (primary) hypertension: Secondary | ICD-10-CM | POA: Diagnosis present

## 2022-11-12 DIAGNOSIS — G894 Chronic pain syndrome: Secondary | ICD-10-CM | POA: Diagnosis present

## 2022-11-12 DIAGNOSIS — I69898 Other sequelae of other cerebrovascular disease: Secondary | ICD-10-CM

## 2022-11-12 DIAGNOSIS — Z8619 Personal history of other infectious and parasitic diseases: Secondary | ICD-10-CM | POA: Diagnosis not present

## 2022-11-12 DIAGNOSIS — Z9981 Dependence on supplemental oxygen: Secondary | ICD-10-CM

## 2022-11-12 DIAGNOSIS — G9341 Metabolic encephalopathy: Secondary | ICD-10-CM

## 2022-11-12 DIAGNOSIS — J449 Chronic obstructive pulmonary disease, unspecified: Secondary | ICD-10-CM | POA: Diagnosis present

## 2022-11-12 DIAGNOSIS — Z803 Family history of malignant neoplasm of breast: Secondary | ICD-10-CM | POA: Diagnosis not present

## 2022-11-12 DIAGNOSIS — Z8673 Personal history of transient ischemic attack (TIA), and cerebral infarction without residual deficits: Secondary | ICD-10-CM

## 2022-11-12 DIAGNOSIS — Z7902 Long term (current) use of antithrombotics/antiplatelets: Secondary | ICD-10-CM | POA: Diagnosis not present

## 2022-11-12 DIAGNOSIS — J690 Pneumonitis due to inhalation of food and vomit: Secondary | ICD-10-CM | POA: Diagnosis present

## 2022-11-12 DIAGNOSIS — F1120 Opioid dependence, uncomplicated: Secondary | ICD-10-CM | POA: Insufficient documentation

## 2022-11-12 DIAGNOSIS — R569 Unspecified convulsions: Secondary | ICD-10-CM | POA: Diagnosis not present

## 2022-11-12 DIAGNOSIS — R4789 Other speech disturbances: Secondary | ICD-10-CM | POA: Diagnosis not present

## 2022-11-12 DIAGNOSIS — I161 Hypertensive emergency: Secondary | ICD-10-CM | POA: Diagnosis present

## 2022-11-12 DIAGNOSIS — Z880 Allergy status to penicillin: Secondary | ICD-10-CM

## 2022-11-12 DIAGNOSIS — Z79899 Other long term (current) drug therapy: Secondary | ICD-10-CM | POA: Diagnosis not present

## 2022-11-12 DIAGNOSIS — Z886 Allergy status to analgesic agent status: Secondary | ICD-10-CM | POA: Diagnosis not present

## 2022-11-12 DIAGNOSIS — Z79891 Long term (current) use of opiate analgesic: Secondary | ICD-10-CM | POA: Diagnosis not present

## 2022-11-12 DIAGNOSIS — J189 Pneumonia, unspecified organism: Secondary | ICD-10-CM | POA: Diagnosis present

## 2022-11-12 DIAGNOSIS — Z8719 Personal history of other diseases of the digestive system: Secondary | ICD-10-CM

## 2022-11-12 DIAGNOSIS — Z888 Allergy status to other drugs, medicaments and biological substances status: Secondary | ICD-10-CM | POA: Diagnosis not present

## 2022-11-12 DIAGNOSIS — F0390 Unspecified dementia without behavioral disturbance: Secondary | ICD-10-CM | POA: Diagnosis present

## 2022-11-12 DIAGNOSIS — J9601 Acute respiratory failure with hypoxia: Secondary | ICD-10-CM | POA: Diagnosis present

## 2022-11-12 LAB — CBC
HCT: 49.9 % — ABNORMAL HIGH (ref 36.0–46.0)
Hemoglobin: 16.6 g/dL — ABNORMAL HIGH (ref 12.0–15.0)
MCH: 30.5 pg (ref 26.0–34.0)
MCHC: 33.3 g/dL (ref 30.0–36.0)
MCV: 91.7 fL (ref 80.0–100.0)
Platelets: 201 10*3/uL (ref 150–400)
RBC: 5.44 MIL/uL — ABNORMAL HIGH (ref 3.87–5.11)
RDW: 13.8 % (ref 11.5–15.5)
WBC: 18 10*3/uL — ABNORMAL HIGH (ref 4.0–10.5)
nRBC: 0 % (ref 0.0–0.2)

## 2022-11-12 LAB — COMPREHENSIVE METABOLIC PANEL
ALT: 15 U/L (ref 0–44)
AST: 15 U/L (ref 15–41)
Albumin: 4.3 g/dL (ref 3.5–5.0)
Alkaline Phosphatase: 109 U/L (ref 38–126)
Anion gap: 13 (ref 5–15)
BUN: 18 mg/dL (ref 8–23)
CO2: 27 mmol/L (ref 22–32)
Calcium: 9.5 mg/dL (ref 8.9–10.3)
Chloride: 97 mmol/L — ABNORMAL LOW (ref 98–111)
Creatinine, Ser: 0.99 mg/dL (ref 0.44–1.00)
GFR, Estimated: >60 mL/min (ref >60)
Glucose, Bld: 98 mg/dL (ref 70–99)
Potassium: 3.5 mmol/L (ref 3.5–5.1)
Sodium: 137 mmol/L (ref 135–145)
Total Bilirubin: 4.6 mg/dL — ABNORMAL HIGH (ref 0.3–1.2)
Total Protein: 7.8 g/dL (ref 6.5–8.1)

## 2022-11-12 LAB — BLOOD GAS, VENOUS
Acid-Base Excess: 9.2 mmol/L — ABNORMAL HIGH (ref 0.0–2.0)
Bicarbonate: 34.8 mmol/L — ABNORMAL HIGH (ref 20.0–28.0)
O2 Saturation: 88.5 %
Patient temperature: 37
pCO2, Ven: 50 mmHg (ref 44–60)
pH, Ven: 7.45 — ABNORMAL HIGH (ref 7.25–7.43)
pO2, Ven: 57 mmHg — ABNORMAL HIGH (ref 32–45)

## 2022-11-12 LAB — TROPONIN I (HIGH SENSITIVITY)
Troponin I (High Sensitivity): 11 ng/L (ref <18)
Troponin I (High Sensitivity): 14 ng/L (ref <18)

## 2022-11-12 LAB — MAGNESIUM: Magnesium: 2.2 mg/dL (ref 1.7–2.4)

## 2022-11-12 LAB — LACTIC ACID, PLASMA: Lactic Acid, Venous: 1.4 mmol/L (ref 0.5–1.9)

## 2022-11-12 MED ORDER — ONDANSETRON HCL 4 MG PO TABS: 4.0000 mg | ORAL_TABLET | Freq: Four times a day (QID) | ORAL | Status: DC | PRN

## 2022-11-12 MED ORDER — AMLODIPINE BESYLATE 5 MG PO TABS
5.0000 mg | ORAL_TABLET | Freq: Every day | ORAL | Status: DC
  Administered 2022-11-13 – 2022-11-18 (×6): 5 mg via ORAL
  Filled 2022-11-12 (×6): qty 1

## 2022-11-12 MED ORDER — ENOXAPARIN SODIUM 40 MG/0.4ML IJ SOSY
40.0000 mg | PREFILLED_SYRINGE | INTRAMUSCULAR | Status: DC
  Administered 2022-11-13 – 2022-11-17 (×6): 40 mg via SUBCUTANEOUS
  Filled 2022-11-12 (×6): qty 0.4

## 2022-11-12 MED ORDER — ACETAMINOPHEN 650 MG RE SUPP: 650.0000 mg | Freq: Four times a day (QID) | RECTAL | Status: DC | PRN

## 2022-11-12 MED ORDER — SODIUM CHLORIDE 0.9 % IV SOLN
2.0000 g | Freq: Once | INTRAVENOUS | Status: AC
  Administered 2022-11-12: 2 g via INTRAVENOUS
  Filled 2022-11-12: qty 20

## 2022-11-12 MED ORDER — LOSARTAN POTASSIUM 25 MG PO TABS
25.0000 mg | ORAL_TABLET | Freq: Every day | ORAL | Status: DC
  Administered 2022-11-13 – 2022-11-18 (×6): 25 mg via ORAL
  Filled 2022-11-12 (×6): qty 1

## 2022-11-12 MED ORDER — CLOPIDOGREL BISULFATE 75 MG PO TABS
75.0000 mg | ORAL_TABLET | Freq: Every day | ORAL | Status: DC
  Administered 2022-11-13 – 2022-11-18 (×6): 75 mg via ORAL
  Filled 2022-11-12 (×6): qty 1

## 2022-11-12 MED ORDER — IPRATROPIUM-ALBUTEROL 0.5-2.5 (3) MG/3ML IN SOLN: 3.0000 mL | RESPIRATORY_TRACT | Status: DC | PRN

## 2022-11-12 MED ORDER — ATORVASTATIN CALCIUM 20 MG PO TABS
40.0000 mg | ORAL_TABLET | Freq: Every day | ORAL | Status: DC
  Administered 2022-11-13 – 2022-11-18 (×6): 40 mg via ORAL
  Filled 2022-11-12 (×6): qty 2

## 2022-11-12 MED ORDER — HYDRALAZINE HCL 20 MG/ML IJ SOLN
5.0000 mg | Freq: Four times a day (QID) | INTRAMUSCULAR | Status: DC | PRN
  Filled 2022-11-12: qty 1

## 2022-11-12 MED ORDER — SODIUM CHLORIDE 0.9 % IV SOLN
1.0000 g | INTRAVENOUS | Status: DC
  Administered 2022-11-13 – 2022-11-17 (×5): 1 g via INTRAVENOUS
  Filled 2022-11-12: qty 1
  Filled 2022-11-12 (×5): qty 10

## 2022-11-12 MED ORDER — ACETAMINOPHEN 325 MG PO TABS
650.0000 mg | ORAL_TABLET | Freq: Four times a day (QID) | ORAL | Status: DC | PRN
  Administered 2022-11-14 – 2022-11-16 (×5): 650 mg via ORAL
  Filled 2022-11-12 (×6): qty 2

## 2022-11-12 MED ORDER — SODIUM CHLORIDE 0.9 % IV SOLN
100.0000 mg | Freq: Once | INTRAVENOUS | Status: AC
  Administered 2022-11-12: 100 mg via INTRAVENOUS
  Filled 2022-11-12: qty 100

## 2022-11-12 MED ORDER — LACTATED RINGERS IV BOLUS
1000.0000 mL | Freq: Once | INTRAVENOUS | Status: AC
  Administered 2022-11-12: 1000 mL via INTRAVENOUS

## 2022-11-12 MED ORDER — SODIUM CHLORIDE 0.9 % IV SOLN
100.0000 mg | Freq: Two times a day (BID) | INTRAVENOUS | Status: DC
  Administered 2022-11-14 (×2): 100 mg via INTRAVENOUS
  Filled 2022-11-12 (×2): qty 100

## 2022-11-12 MED ORDER — ONDANSETRON HCL 4 MG/2ML IJ SOLN: 4.0000 mg | Freq: Four times a day (QID) | INTRAMUSCULAR | Status: DC | PRN

## 2022-11-12 MED ORDER — METHADONE HCL 10 MG/ML PO CONC
47.0000 mg | Freq: Every day | ORAL | Status: DC
  Administered 2022-11-13 – 2022-11-18 (×6): 47 mg via ORAL
  Filled 2022-11-12 (×6): qty 5

## 2022-11-12 NOTE — ED Notes (Signed)
Pt ambulated to the restroom but unable to provide a sample at this time.

## 2022-11-12 NOTE — ED Provider Notes (Signed)
[facility] Provider Note    Event Date/Time   First MD Initiated Contact with Patient 11/12/22 1857     (approximate)   History   Weakness   HPI  Natalie [name] is a 67 y.o. female with past medical history of COPD, hypertension, substance use, left MCA stroke who presents with weakness cough and low oxygen saturation.  Patient tells me she has been feeling just very tired and cold.  Family notes that she has been somewhat confused and very weak having difficulty getting around because of just generalized weakness.  She was saying things that did not make sense last night.  Patient is also had a cough that is intermittently productive.  Does tell me she has been cold and has had subjective fevers.  Denies nausea vomiting or diarrhea.  Denies abdominal pain or chest pain.   Patient does have oxygen at home as needed.  Apparently her sats were in the 70s but patient was not wearing oxygen at home.  Does not typically wear.  Past Medical History:  Diagnosis Date   Cataracts, bilateral    Chickenpox cataracts   COPD (chronic obstructive pulmonary disease) (HCC)    Depression    Drug addiction (HCC)    Headache    Hepatitis C virus infection    Hepatitis C without hepatic coma    History of chicken pox    History of colon polyps    Seizures (HCC)    Stroke [facility])     Patient Active Problem List   Diagnosis Date Noted   Swelling of limb 11/26/2021   Frontal lobe and executive function deficit    Hypoalbuminemia due to protein-calorie malnutrition (HCC)    Chronic pain syndrome    Dysphagia, post-stroke    Acute ischemic left MCA stroke (HCC) 10/11/2020   Aphasia due to acute cerebrovascular accident (CVA) (HCC) 10/11/2020   Cerebral thrombosis with cerebral infarction 10/10/2020   Malnutrition of moderate degree 10/01/2020   Pressure injury of skin 09/30/2020   Encephalopathy acute 09/30/2020   Altered mental status    Respiratory failure (HCC)     Encephalopathy 09/28/2020   Status epilepticus (HCC)    NSTEMI (non-ST elevated myocardial infarction) (HCC) 07/24/2017   Cataracts, bilateral 03/30/2017   COPD (chronic obstructive pulmonary disease) (HCC) 03/30/2017   Depression 03/30/2017   Drug addiction (HCC) 03/30/2017   Seizures (HCC) 03/30/2017   Chronic migraine without aura without status migrainosus, not intractable 07/26/2014   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   CKD (chronic kidney disease) stage 3, GFR 30-59 ml/min (HCC) 08/04/2013   Edema 03/24/2011   Tobacco use disorder 03/24/2011   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 --  Excl. in GC? --     Most recent vital signs: Vitals:   11/12/22 1833 11/12/22 1836  BP: (!) 180/105   Pulse: 77 80  Resp:  20  Temp:  98.3 F (36.8 C)  SpO2:  (!) 82%     General: Awake, no distress.  Patient looks tired but nontoxic, mucous membranes are dry CV:  Good peripheral perfusion.  No peripheral edema Resp:  Normal effort.  No wheezing Abd:  No distention.  Abdomen is soft and nontender Neuro:             Awake, Alert, Oriented x 2 Other:  Aox3, nml speech  PERRL, EOMI, face symmetric, nml tongue movement  5/5 strength in the BL upper and lower extremities  Sensation grossly intact in the BL upper and lower extremities  Finger-nose-finger intact BL    ED Results / Procedures / Treatments  Labs (all labs ordered are listed, but only abnormal results are displayed) Labs Reviewed  CBC -  Abnormal; Notable for the following components:      Result Value   WBC 18.0 (*)    RBC 5.44 (*)    Hemoglobin 16.6 (*)    HCT 49.9 (*)    All other components within normal limits  URINALYSIS, ROUTINE W REFLEX MICROSCOPIC  COMPREHENSIVE METABOLIC PANEL  MAGNESIUM  BLOOD GAS, VENOUS  URINE DRUG SCREEN, QUALITATIVE ([facility] ONLY)  LACTIC ACID, PLASMA  LACTIC ACID, PLASMA  CBG MONITORING, ED  TROPONIN I (HIGH SENSITIVITY)  TROPONIN I (HIGH SENSITIVITY)     EKG  EKG shows sinus rhythm with prolonged QTc interval diffuse T wave flattening normal axis no acute ischemic changes   RADIOLOGY I reviewed interpreted chest x-ray which shows a   PROCEDURES:  Critical Care performed: Yes, see critical care procedure note(s)  Procedures  The patient is on the cardiac monitor to evaluate for evidence of arrhythmia and/or significant heart rate changes.   MEDICATIONS ORDERED IN ED: Medications  cefTRIAXone (ROCEPHIN) 2 g in sodium chloride 0.9 % 100 mL IVPB (2 g Intravenous New Bag/Given 11/12/22 2046)  doxycycline (VIBRAMYCIN) 100 mg in sodium chloride 0.9 % 250 mL IVPB (has no administration in time range)  lactated ringers bolus 1,000 mL (1,000 mLs Intravenous New Bag/Given 11/12/22 2044)     IMPRESSION / MDM / ASSESSMENT AND PLAN / ED COURSE  I reviewed the triage vital signs and the nursing notes.                              Patient's presentation is most consistent with acute presentation with potential threat to life or bodily function.  Differential diagnosis includes, but is not limited to, pneumonia, hypercarbia, electrode abnormality, dehydration, intracranial hemorrhage, polypharmacy, renal failure  The patient is a 67 year old female who has underlying COPD who presents today because of generalized weakness fatigue low oxygen saturation and some confusion.  Symptoms been going on for about a week.  Family says she has been intermittently not making sense when talking has  been very weak and not been able to get around well.  Has been coughing and is somewhat short of breath and had low pulse ox at home in the 70s.  Initial room air sat in the ER was 82%.  Patient placed on 3 L with sats in the low 90s.  She is hypertensive vitals are otherwise reassuring.  She looks like she feels fatigued but is nontoxic.  She  is not wheezing abdominal exam is benign and there is no peripheral edema.  Chest x-ray obtained does show likely a left lower lobe pneumonia which makes sense clinically.  Plan to check CBC CMP VBG troponin and CT head.  Will give a bolus of fluid and cover with Rocephin and doxycycline.  Given her hypoxia and confusion generalized weakness I do think she will need admission.       FINAL CLINICAL IMPRESSION(S) / ED DIAGNOSES   Final diagnoses:  Community acquired pneumonia of left lower lobe of lung     Rx / DC Orders   ED Discharge Orders     None        Note:  This document was prepared using Dragon voice recognition software and may include unintentional dictation errors.   Georga Hacking, MD 11/12/22 2100

## 2022-11-12 NOTE — Assessment & Plan Note (Addendum)
CT head with no acute findings Acute CVA not suspected at this time Continue clopidogrel and atorvastatin

## 2022-11-12 NOTE — ED Triage Notes (Signed)
Pt presents to ED with weakness since Sunday intermittently. Son states some confusion and HX of UTI. Pt states weakness "all over". Pt denies fevers or chills.   Pt is 82% on RA. Pt sees methadone clinic. Pt placed on 2L/min via [place].

## 2022-11-12 NOTE — H&P (Signed)
History and Physical    [name] ZOX:[id number] DOB: 1956-03-18 DOA: 11/12/2022 DOS: the patient was seen and examined on 11/12/2022 PCP: Rayetta Humphrey, MD  Patient coming from: Home  Chief Complaint:  Chief Complaint  Patient presents with   Weakness    HPI: Natalie [name] is a 67 y.o. 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  She was also noted to be confused . most of the history is given by son and ex-husband at bedside.  They report that since her stroke she has had occasional problems with word finding and getting the right words out, however over the past day it has become more pronounced and she has been saying things that did not make sense ED course and data review: On arrival BP 180/105 with pulse 77, afebrile, O2 sat 82% on room air improving to 96% on 3 L.  Labs notable for WBC 18,000, lactic acid pending.  VBG with pH 7.45 and pCO2 of 50.  CMP unremarkable.  Troponin 11.  .  EKG, personally viewed and interpreted showing sinus rhythm at 81 with nonspecific ST-T wave changes. Imaging: Chest x-ray with hazy in space and interstitial opacities in the left mid and lower lobe favor pneumonia and/or aspiration CT head with chronic atrophic and ischemic changes without acute abnormality. Patient placed on ceftriaxone and doxycycline due to penicillin And azithromycin allergy, given a fluid bolus. Hospitalist consulted for admission.     Past Medical History:  Diagnosis Date   Cataracts, bilateral    Chickenpox cataracts   COPD (chronic obstructive pulmonary disease) (HCC)    Depression    Drug addiction (HCC)    Headache    Hepatitis C virus infection    Hepatitis  C without hepatic coma    History of chicken pox    History of colon polyps    Seizures (HCC)    Stroke [facility])    Past Surgical History:  Procedure Laterality Date   APPENDECTOMY     CHOLECYSTECTOMY     COLONOSCOPY WITH PROPOFOL N/A 04/30/2015   Procedure: COLONOSCOPY WITH PROPOFOL;  Surgeon: Elnita Maxwell, MD;  Location: [facility] ENDOSCOPY;  Service: Endoscopy;  Laterality: N/A;   COLONOSCOPY WITH PROPOFOL N/A 07/13/2017   Procedure: COLONOSCOPY WITH PROPOFOL;  Surgeon: Scot Jun, MD;  Location: [facility] ENDOSCOPY;  Service: Endoscopy;  Laterality: N/A;   ESOPHAGOGASTRODUODENOSCOPY (EGD) WITH PROPOFOL N/A 07/13/2017   Procedure: ESOPHAGOGASTRODUODENOSCOPY (EGD) WITH PROPOFOL;  Surgeon: Scot Jun, MD;  Location: [facility] ENDOSCOPY;  Service: Endoscopy;  Laterality: N/A;   EYE SURGERY     LOWER EXTREMITY ANGIOGRAPHY Right 01/01/2022   Procedure: Lower Extremity Angiography;  Surgeon: Annice Needy, MD;  Location: [facility] INVASIVE CV LAB;  Service: Cardiovascular;  Laterality: Right;   Social History:  reports that she quit smoking about 7 years ago. Her smoking use included cigarettes. She has a 20.50 pack-year smoking history. She has never used smokeless tobacco. She reports that she does not drink alcohol and does not use drugs.  Allergies  Allergen Reactions   Sulfasalazine Shortness Of Breath   Aspirin    Darvon [Propoxyphene]    Iodine  Penicillins    Sulfa Antibiotics    Zithromax [Azithromycin]     Family History  Problem Relation Age of Onset   Breast cancer Paternal Grandmother     Prior to Admission medications   Medication Sig Start Date End Date Taking? Authorizing Provider  amLODipine (NORVASC) 5 MG tablet Take 5 mg by mouth daily. 09/06/21  Yes [provider]  atorvastatin (LIPITOR) 40 MG tablet Take 40 mg by mouth daily.   Yes [provider]  clopidogrel (PLAVIX) 75 MG tablet Take 1 tablet (75 mg total) by mouth daily. 10/19/20  Yes Love,  Evlyn Kanner, PA-C  losartan (COZAAR) 25 MG tablet Take 25 mg by mouth daily. 04/08/22 04/08/23 Yes [provider]  methadone (DOLOPHINE) 1 mg/ml oral solution Take 47 mg by mouth daily.   Yes [provider]  furosemide (LASIX) 20 MG tablet Take by mouth. Patient not taking: Reported on 11/12/2022 04/08/22 04/08/23  [provider]  ipratropium-albuterol (DUONEB) 0.5-2.5 (3) MG/3ML SOLN Take by nebulization. Patient not taking: Reported on 11/12/2022 12/27/21   [provider]    Physical Exam: Vitals:   11/12/22 1833 11/12/22 1836 11/12/22 2220  BP: (!) 180/105  (!) 174/79  Pulse: 77 80 88  Resp:  20 20  Temp:  98.3 F (36.8 C)   TempSrc:  Oral   SpO2:  (!) 82% 96%   Physical Exam Vitals and nursing note reviewed.  Constitutional:      General: She is not in acute distress. HENT:     Head: Normocephalic and atraumatic.  Cardiovascular:     Rate and Rhythm: Normal rate and regular rhythm.     Heart sounds: Normal heart sounds.  Pulmonary:     Effort: Pulmonary effort is normal.     Breath sounds: Normal breath sounds.  Abdominal:     Palpations: Abdomen is soft.     Tenderness: There is no abdominal tenderness.  Neurological:     Mental Status: She is disoriented.     Labs on Admission: I have personally reviewed following labs and imaging studies  CBC: Recent Labs  Lab 11/12/22 2044  WBC 18.0*  HGB 16.6*  HCT 49.9*  MCV 91.7  PLT 201   Basic Metabolic Panel: Recent Labs  Lab 11/12/22 2044  NA 137  K 3.5  CL 97*  CO2 27  GLUCOSE 98  BUN 18  CREATININE 0.99  CALCIUM 9.5  MG 2.2   GFR: Estimated Creatinine Clearance: 51 mL/min (by C-G formula based on SCr of 0.99 mg/dL). Liver Function Tests: Recent Labs  Lab 11/12/22 2044  AST 15  ALT 15  ALKPHOS 109  BILITOT 4.6*  PROT 7.8  ALBUMIN 4.3   No results for input(s): "LIPASE", "AMYLASE" in the last 168 hours. No results for input(s): "AMMONIA" in the last 168  hours. Coagulation Profile: No results for input(s): "INR", "PROTIME" in the last 168 hours. Cardiac Enzymes: No results for input(s): "CKTOTAL", "CKMB", "CKMBINDEX", "TROPONINI" in the last 168 hours. BNP (last 3 results) No results for input(s): "PROBNP" in the last 8760 hours. HbA1C: No results for input(s): "HGBA1C" in the last 72 hours. CBG: No results for input(s): "GLUCAP" in the last 168 hours. Lipid Profile: No results for input(s): "CHOL", "HDL", "LDLCALC", "TRIG", "CHOLHDL", "LDLDIRECT" in the last 72 hours. Thyroid Function Tests: No results for input(s): "TSH", "T4TOTAL", "FREET4", "T3FREE", "THYROIDAB" in the last 72 hours. Anemia Panel: No results for input(s): "VITAMINB12", "FOLATE", "FERRITIN", "TIBC", "IRON", "RETICCTPCT" in the  last 72 hours. Urine analysis:    Component Value Date/Time   COLORURINE YELLOW 10/19/2020 1222   APPEARANCEUR HAZY (A) 10/19/2020 1222   APPEARANCEUR Clear 05/01/2012 0957   LABSPEC 1.016 10/19/2020 1222   LABSPEC 1.018 05/01/2012 0957   PHURINE 7.0 10/19/2020 1222   GLUCOSEU NEGATIVE 10/19/2020 1222   GLUCOSEU Negative 05/01/2012 0957   HGBUR NEGATIVE 10/19/2020 1222   BILIRUBINUR NEGATIVE 10/19/2020 1222   BILIRUBINUR Negative 05/01/2012 0957   KETONESUR NEGATIVE 10/19/2020 1222   PROTEINUR NEGATIVE 10/19/2020 1222   NITRITE NEGATIVE 10/19/2020 1222   LEUKOCYTESUR SMALL (A) 10/19/2020 1222   LEUKOCYTESUR Negative 05/01/2012 0957    Radiological Exams on Admission: CT HEAD WO CONTRAST ( )  Result Date: 11/12/2022 CLINICAL DATA:  Altered mental status EXAM: CT HEAD WITHOUT CONTRAST TECHNIQUE: Contiguous axial images were obtained from the base of the skull through the vertex without intravenous contrast. RADIATION DOSE REDUCTION: This exam was performed according to the departmental dose-optimization program which includes automated exposure control, adjustment of the mA and/or kV according to patient size and/or use of iterative  reconstruction technique. COMPARISON:  09/30/2020, 10/09/2020 FINDINGS: Brain: No evidence of acute infarction, hemorrhage, hydrocephalus, extra-axial collection or mass lesion/mass effect. Chronic atrophic and ischemic changes are noted. Interval stable left MCA infarct is noted when compared with the prior exam. Vascular: No hyperdense vessel or unexpected calcification. Skull: Normal. Negative for fracture or focal lesion. Sinuses/Orbits: No acute finding. Other: None. IMPRESSION: Chronic atrophic and ischemic changes without acute abnormality. Electronically Signed   By: Alcide Clever M.D.   On: 11/12/2022 21:03   DG Chest 2 View  Result Date: 11/12/2022 CLINICAL DATA:  Shortness of breath and weakness EXAM: CHEST - 2 VIEW COMPARISON:  Chest radiographs 10/14/2022 FINDINGS: Stable cardiomediastinal silhouette. Aortic atherosclerotic calcification. Hyperinflation and chronic bronchitic change. Hazy airspace and interstitial opacities in the left lower lobe are new since 10/14/2022 and favored infectious/inflammatory. Question small left pleural effusion. No right pleural effusion. No pneumothorax. No displaced rib fractures. IMPRESSION: Hazy airspace and interstitial opacities in the left mid and lower lobe favor pneumonia and/or aspiration. Electronically Signed   By: Minerva Fester M.D.   On: 11/12/2022 19:52     Data Reviewed: Relevant notes from primary care and specialist visits, past discharge summaries as available in EHR, including Care Everywhere. Prior diagnostic testing as pertinent to current admission diagnoses Updated medications and problem lists for reconciliation ED course, including vitals, labs, imaging, treatment and response to treatment Triage notes, nursing and pharmacy notes and ED provider's notes Notable results as noted in HPI   Assessment and Plan: * CAP (community acquired pneumonia) Suspect aspiration pneumonia Acute on chronic respiratory failure with  hypoxia Rocephin and doxycycline (penicillin and azithromycin allergy) Supplemental oxygen Incentive spirometry Supplemental oxygen to keep sats over 94 Aspiration precautions Will keep n.p.o. overnight in view of acute confusion and suspected aspiration   Acute metabolic encephalopathy Likely multifactorial and related to pneumonia, chronic methadone use.   Questionable seizure history so could be postictal CT head non acute -Neurologic checks -Keep n.p.o. overnight -Fall seizure and aspiration precautions -EEG -Neurology consulted  Hypertensive emergency Continue home losartan and amlodipine Hydralazine IV as needed for SBP over 170  Methadone maintenance therapy patient (HCC) Will hold methadone tonight and resume in the a.m.  History of CVA (cerebrovascular accident) CT head with no acute findings Acute CVA not suspected at this time Continue clopidogrel and atorvastatin  Chronic pain syndrome Will hold methadone tonight  COPD (chronic  obstructive pulmonary disease) (HCC) Acute on chronic respiratory failure Patient not wheezing Followed by pulmonology for stage II to lll copd.  On oxygen at 3 liters at baseline but only uses as needed  -Continue duo neb tid and as needed, pulmicort inhaler - Continue O2 at 3 L to keep sats over 92%.  O2 sat was 82% on room air      DVT prophylaxis: Lovenox  Consults: Dr Selina Cooley. neurology  Advance Care Planning:   Code Status: Prior   Family Communication: Child and ex-husband at bedside  Disposition Plan: Back to previous home environment  Severity of Illness: The appropriate patient status for this patient is INPATIENT. Inpatient status is judged to be reasonable and necessary in order to provide the required intensity of service to ensure the patient's safety. The patient's presenting symptoms, physical exam findings, and initial radiographic and laboratory data in the context of their chronic comorbidities is felt to  place them at high risk for further clinical deterioration. Furthermore, it is not anticipated that the patient will be medically stable for discharge from the hospital within 2 midnights of admission.   * I certify that at the point of admission it is my clinical judgment that the patient will require inpatient hospital care spanning beyond 2 midnights from the point of admission due to high intensity of service, high risk for further deterioration and high frequency of surveillance required.*  Author: Andris Baumann, MD 11/12/2022 10:30 PM  For on call review [email or web address].

## 2022-11-12 NOTE — Telephone Encounter (Signed)
Called the son back. Pt on Monday evening started having feeling of nausea and headache. During the evening pt vomitted x2 small amount. Yesterday she woke up and was still not feeling well. Had some altered confusion. Had one vomiting episode. Denies being around anyone with a stomach bug. States that they weren't sure if this was residual stroke side effects from being tired. She woke up today and she is still confused. He had to explain things to her. She is having expressive aphasia, denies any weakness on one side vs the other. Denies facial droop. States that he was discussing with her about going to ER to be evaluated and she carried on conversation stating that she would rather wait another day. He feels there is some moments where she is lucid. He denies her having a fever. Advised the patient that unfortunately its hard to know for certain if there is something new neurologically going on without her being evaluated. Advised that if she does have an infection like a UTI that can cause AMS but so can stroke. The fact that she had headache, N&V. And the speech problems it can be concerning for new stroke. I advised that truly he would need to get her worked up to be certain. I don't have an opening to offer the patient to have her seen sooner. Pts son verbalized understanding and was appreciative for the call. He will continue to monitor and keep [place] posted.

## 2022-11-12 NOTE — Assessment & Plan Note (Signed)
Continue home losartan and amlodipine Hydralazine IV as needed for SBP over 170

## 2022-11-12 NOTE — Assessment & Plan Note (Addendum)
Suspect aspiration pneumonia Acute on chronic respiratory failure with hypoxia Rocephin and doxycycline (penicillin and azithromycin allergy) Supplemental oxygen Incentive spirometry Supplemental oxygen to keep sats over 94 Aspiration precautions Will keep n.p.o. overnight in view of acute confusion and suspected aspiration

## 2022-11-12 NOTE — Assessment & Plan Note (Signed)
Will hold methadone tonight

## 2022-11-12 NOTE — Assessment & Plan Note (Addendum)
Acute on chronic respiratory failure Patient not wheezing Followed by pulmonology for stage II to lll copd.  On oxygen at 3 liters at baseline but only uses as needed  -Continue duo neb tid and as needed, pulmicort inhaler - Continue O2 at 3 L to keep sats over 92%.  O2 sat was 82% on room air

## 2022-11-12 NOTE — Telephone Encounter (Signed)
Pt son called. Stated pt is having issues this morning. Stated pt is repeating thing and asking weird question. Stated he would like to speak with nurse. I informed him if he think pt is having another stroke he needs to follow up with ER or Urgent Care.

## 2022-11-12 NOTE — Assessment & Plan Note (Addendum)
Likely multifactorial and related to pneumonia, chronic methadone use.   Questionable seizure history so could be postictal CT head non acute -Neurologic checks -Keep n.p.o. overnight -Fall seizure and aspiration precautions -EEG -Neurology consulted

## 2022-11-12 NOTE — Assessment & Plan Note (Signed)
Will hold methadone tonight and resume in the a.m.

## 2022-11-13 ENCOUNTER — Inpatient Hospital Stay: Payer: BC Managed Care – PPO

## 2022-11-13 ENCOUNTER — Encounter: Payer: Self-pay | Admitting: Internal Medicine

## 2022-11-13 DIAGNOSIS — G9341 Metabolic encephalopathy: Secondary | ICD-10-CM

## 2022-11-13 DIAGNOSIS — R569 Unspecified convulsions: Secondary | ICD-10-CM

## 2022-11-13 LAB — BASIC METABOLIC PANEL
Anion gap: 15 (ref 5–15)
BUN: 15 mg/dL (ref 8–23)
CO2: 25 mmol/L (ref 22–32)
Calcium: 9.4 mg/dL (ref 8.9–10.3)
Chloride: 98 mmol/L (ref 98–111)
Creatinine, Ser: 0.85 mg/dL (ref 0.44–1.00)
GFR, Estimated: >60 mL/min (ref >60)
Glucose, Bld: 112 mg/dL — ABNORMAL HIGH (ref 70–99)
Potassium: 3.3 mmol/L — ABNORMAL LOW (ref 3.5–5.1)
Sodium: 138 mmol/L (ref 135–145)

## 2022-11-13 LAB — URINALYSIS, ROUTINE W REFLEX MICROSCOPIC
Bacteria, UA: NONE SEEN
Bilirubin Urine: NEGATIVE
Glucose, UA: NEGATIVE mg/dL
Ketones, ur: 20 mg/dL — AB
Nitrite: NEGATIVE
Protein, ur: NEGATIVE mg/dL
Specific Gravity, Urine: 1.015 (ref 1.005–1.030)
pH: 6 (ref 5.0–8.0)

## 2022-11-13 LAB — URINE DRUG SCREEN, QUALITATIVE (ARMC ONLY)
Amphetamines, Ur Screen: NOT DETECTED
Barbiturates, Ur Screen: NOT DETECTED
Benzodiazepine, Ur Scrn: NOT DETECTED
Cannabinoid 50 Ng, Ur ~~LOC~~: NOT DETECTED
Cocaine Metabolite,Ur ~~LOC~~: NOT DETECTED
MDMA (Ecstasy)Ur Screen: NOT DETECTED
Methadone Scn, Ur: POSITIVE — AB
Opiate, Ur Screen: NOT DETECTED
Phencyclidine (PCP) Ur S: NOT DETECTED
Tricyclic, Ur Screen: NOT DETECTED

## 2022-11-13 LAB — CBC
HCT: 47.8 % — ABNORMAL HIGH (ref 36.0–46.0)
Hemoglobin: 16.1 g/dL — ABNORMAL HIGH (ref 12.0–15.0)
MCH: 30.9 pg (ref 26.0–34.0)
MCHC: 33.7 g/dL (ref 30.0–36.0)
MCV: 91.7 fL (ref 80.0–100.0)
Platelets: 195 10*3/uL (ref 150–400)
RBC: 5.21 MIL/uL — ABNORMAL HIGH (ref 3.87–5.11)
RDW: 13.7 % (ref 11.5–15.5)
WBC: 17 10*3/uL — ABNORMAL HIGH (ref 4.0–10.5)
nRBC: 0 % (ref 0.0–0.2)

## 2022-11-13 LAB — LACTIC ACID, PLASMA: Lactic Acid, Venous: 1.3 mmol/L (ref 0.5–1.9)

## 2022-11-13 LAB — HIV ANTIBODY (ROUTINE TESTING W REFLEX): HIV Screen 4th Generation wRfx: NONREACTIVE

## 2022-11-13 NOTE — Progress Notes (Signed)
Eeg done 

## 2022-11-13 NOTE — ED Notes (Signed)
Pts bed alarm, alarming. Pt sitting on side of bed, attempting to get out of bed. Pt assisted to bedside toilet, insured clean and dry post void. Pt assisted back into bed with monitoring device and bed alarm re-initiated. Pt provided with redirection on using her call bell. Room door left open and pt close to nursing station.

## 2022-11-13 NOTE — Procedures (Signed)
Routine EEG Report  Natalie [name] is a 67 y.o. female with a history of seizure who is undergoing an EEG to evaluate for seizures.  Report: This EEG was acquired with electrodes placed according to the International 10-20 electrode system (including Fp1, Fp2, F3, F4, C3, C4, P3, P4, O1, O2, T3, T4, T5, T6, A1, A2, Fz, Cz, Pz). The following electrodes were missing or displaced: none.  The occipital dominant rhythm was 5-6 Hz. This activity is reactive to stimulation. Drowsiness was manifested by background fragmentation; deeper stages of sleep were not identified. There was no focal slowing. There were no interictal epileptiform discharges. There were no electrographic seizures identified. Photic stimulation and hyperventilation were not performed.  Impression and clinical correlation: This EEG was obtained while awake and drowsy and is abnormal due to moderate diffuse slowing indicative of global cerebral dysfunction. Epileptiform abnormalities were not seen during this recording.  Bing Neighbors, MD [facility] [phone]  If 7pm- 7am, please page neurology on call as listed in [facility].

## 2022-11-13 NOTE — Progress Notes (Signed)
Progress Note   [name] DOB: 01-12-1956 DOA: 11/12/2022     1 DOS: the patient was seen and examined on 11/13/2022     Subjective:  Patient seen and examined at bedside this morning Still appears altered however much better Patient able to engage in meaningful conversation Denies chest pain nausea vomiting or abdominal pain She tells me she is much better today  Brief hospital course:  From HPI "Natalie [name] is a 67 y.o. 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  She was also noted to be confused . most of the history is given by son and ex-husband at bedside.  They report that since her stroke she has had occasional problems with word finding and getting the right words out, however over the past day it has become more pronounced and she has been saying things that did not make sense ED course and data review: On arrival BP 180/105 with pulse 77, afebrile, O2 sat 82% on room air improving to 96% on 3 L.  Labs notable for WBC 18,000, lactic acid pending.  VBG with pH 7.45 and pCO2 of 50.  CMP unremarkable.  Troponin 11.  .  EKG, personally viewed and interpreted showing sinus rhythm at 81 with nonspecific ST-T wave changes. Imaging: Chest x-ray with hazy in space and interstitial opacities in the left mid and lower lobe favor pneumonia and/or aspiration CT head with chronic atrophic and ischemic changes without acute abnormality. Patient placed on ceftriaxone and doxycycline due to penicillin And azithromycin allergy, given a fluid bolus. Hospitalist consulted for admission. "    Assessment and Plan:  CAP (community acquired pneumonia) Suspect aspiration  pneumonia Acute on chronic respiratory failure with hypoxia Rocephin and doxycycline (penicillin and azithromycin allergy) Supplemental oxygen Incentive spirometry Supplemental oxygen to keep sats over 94 Aspiration precautions Will keep n.p.o. overnight in view of acute confusion and suspected aspiration     Acute metabolic encephalopathy Likely multifactorial and related to pneumonia, chronic methadone use.   Questionable seizure history so could be postictal CT head non acute -Neurologic checks - Diet has been advanced -Fall seizure and aspiration precautions -EEG did not show any epileptiform waves - I discussed the case with neurologist on-call today   Hypertensive emergency Continue home losartan and amlodipine Hydralazine IV as needed for SBP over 170   Methadone maintenance therapy patient (HCC) Will hold methadone tonight and resume in the a.m.   History of CVA (cerebrovascular accident) CT head with no acute findings Acute CVA not suspected at this time Continue clopidogrel and atorvastatin   Chronic pain syndrome Methadone resumed   COPD (chronic obstructive pulmonary disease) (HCC) Acute on chronic hypoxic respiratory failure Patient not wheezing Followed by pulmonology for stage II to lll copd.  On oxygen at 3 liters at baseline but only uses as needed  -Continue duo neb tid and as needed, pulmicort inhaler - Continue O2 at 3 L to keep sats over 92%.  O2 sat was 82% on room air     DVT prophylaxis: Lovenox   Consults: Dr Selina Cooley. neurology   Advance Care Planning:   Code Status: Prior  Disposition Plan: Back to previous home environment   Physical Exam:  Vitals and nursing note reviewed.  Constitutional:      General: She is not in acute distress. HENT:     Head: Normocephalic and atraumatic.  Cardiovascular:     Rate and Rhythm: Normal rate and regular rhythm.     Heart sounds: Normal heart sounds.  Pulmonary:     Effort: Pulmonary  effort is normal.     Breath sounds: Normal breath sounds.  Abdominal:     Palpations: Abdomen is soft.     Tenderness: There is no abdominal tenderness.  Neurological:     Mental Status: Alert and oriented in person and place but not time  Vitals:   11/13/22 0844 11/13/22 1100 11/13/22 1200 11/13/22 1500  BP:  (!) 172/103 (!) 169/94 108/62  Pulse: 82 (!) 58 79 74  Resp: (!) 21 16 16 15   Temp: 98.8 F (37.1 C)  98.1 F (36.7 C) 98 F (36.7 C)  TempSrc: Oral     SpO2: 97% 93% 90% 92%    Data Reviewed: I have reviewed patient's laboratory results today showing sodium 138 potassium 3.3 WBC 17 and hemoglobin 16.1  Family Communication: No family at bedside  Disposition: Status is: Inpatient Continues to be appropriate for inpatient management given altered mental status requiring antibiotic therapy for management of pneumonia  Time spent: 50 minutes  Author: Loyce Dys, MD 11/13/2022 3:49 PM  For on call review [email or web address].

## 2022-11-13 NOTE — ED Notes (Signed)
Pts bed alarm, alarming. Writer in room to reposition pt up into bed and reapply monitoring devices and oxygen pt removed. Pt provided with education on call-bell as well as the need to keep oxygen tubing in nares.

## 2022-11-14 DIAGNOSIS — J189 Pneumonia, unspecified organism: Secondary | ICD-10-CM | POA: Diagnosis not present

## 2022-11-14 LAB — BASIC METABOLIC PANEL
Anion gap: 12 (ref 5–15)
BUN: 23 mg/dL (ref 8–23)
CO2: 28 mmol/L (ref 22–32)
Calcium: 8.8 mg/dL — ABNORMAL LOW (ref 8.9–10.3)
Chloride: 100 mmol/L (ref 98–111)
Creatinine, Ser: 1.13 mg/dL — ABNORMAL HIGH (ref 0.44–1.00)
GFR, Estimated: 54 mL/min — ABNORMAL LOW (ref >60)
Glucose, Bld: 82 mg/dL (ref 70–99)
Potassium: 3 mmol/L — ABNORMAL LOW (ref 3.5–5.1)
Sodium: 140 mmol/L (ref 135–145)

## 2022-11-14 LAB — CBC WITH DIFFERENTIAL/PLATELET
Abs Immature Granulocytes: 0.08 10*3/uL — ABNORMAL HIGH (ref 0.00–0.07)
Basophils Absolute: 0 10*3/uL (ref 0.0–0.1)
Basophils Relative: 0 %
Eosinophils Absolute: 0 10*3/uL (ref 0.0–0.5)
Eosinophils Relative: 0 %
HCT: 44.6 % (ref 36.0–46.0)
Hemoglobin: 14.8 g/dL (ref 12.0–15.0)
Immature Granulocytes: 1 %
Lymphocytes Relative: 5 %
Lymphs Abs: 0.7 10*3/uL (ref 0.7–4.0)
MCH: 31 pg (ref 26.0–34.0)
MCHC: 33.2 g/dL (ref 30.0–36.0)
MCV: 93.3 fL (ref 80.0–100.0)
Monocytes Absolute: 1.1 10*3/uL — ABNORMAL HIGH (ref 0.1–1.0)
Monocytes Relative: 7 %
Neutro Abs: 14.4 10*3/uL — ABNORMAL HIGH (ref 1.7–7.7)
Neutrophils Relative %: 87 %
Platelets: 182 10*3/uL (ref 150–400)
RBC: 4.78 MIL/uL (ref 3.87–5.11)
RDW: 13.8 % (ref 11.5–15.5)
WBC: 16.3 10*3/uL — ABNORMAL HIGH (ref 4.0–10.5)
nRBC: 0 % (ref 0.0–0.2)

## 2022-11-14 MED ORDER — POTASSIUM CHLORIDE CRYS ER 20 MEQ PO TBCR
40.0000 meq | EXTENDED_RELEASE_TABLET | ORAL | Status: AC
  Administered 2022-11-14 (×2): 40 meq via ORAL
  Filled 2022-11-14 (×2): qty 2

## 2022-11-14 MED ORDER — DOXYCYCLINE HYCLATE 100 MG PO TABS
100.0000 mg | ORAL_TABLET | Freq: Two times a day (BID) | ORAL | Status: DC
  Administered 2022-11-14 – 2022-11-18 (×8): 100 mg via ORAL
  Filled 2022-11-14 (×8): qty 1

## 2022-11-14 NOTE — Progress Notes (Signed)
PHARMACIST - PHYSICIAN COMMUNICATION DR:   Meriam Sprague CONCERNING: Antibiotic IV to Oral Route Change Policy  RECOMMENDATION: This patient is receiving doxycycline by the intravenous route.  Based on criteria approved by the Pharmacy and Therapeutics Committee, the antibiotic(s) is/are being converted to the equivalent oral dose form(s).   DESCRIPTION: These criteria include: Patient being treated for a respiratory tract infection, urinary tract infection, cellulitis or clostridium difficile associated diarrhea if on metronidazole The patient is not neutropenic and does not exhibit a GI malabsorption state The patient is eating (either orally or via tube) and/or has been taking other orally administered medications for a least 24 hours The patient is improving clinically and has a Tmax < 100.5  Elliot Gurney, PharmD, BCPS Clinical Pharmacist  11/14/2022 10:58 AM

## 2022-11-14 NOTE — Evaluation (Signed)
Occupational Therapy Evaluation Patient Details Name: Natalie [name] MRN: [id number] DOB: 1956/03/17 Today's Date: 11/14/2022   History of Present Illness Pt is a 67 y.o. 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   Clinical Impression   Pt was seen for OT evaluation this date. Prior to hospital admission, pt was living with her son in a 2 story home. Pt is questionable historian and demonstrates difficulty with orientation to date and impairments in safety awareness, awareness of deficits, STM, and balance impacting her ADL and functional mobility performance and safety. Pt currently requires supervision for safety with all aspects of mobility and ADL. Pt found seated EOB without nasal cannula, SpO2 in mid-70's. Pt denies SOB, just endorses fatigue. [place] reapplied at 3L and with VC for PLB, improves to 87-89%. RN notified, cleared to increase O2 to 4L. Able to maintain 89-91% on 4L. Pt educated in need to wear at all times. Pt would benefit from skilled OT services to address noted impairments and functional limitations (see below for any additional details) in order to maximize safety and independence while minimizing falls risk and caregiver burden. Upon hospital discharge, recommend increased supervision/support to ensure safety as well as follow up OT.     Recommendations for follow up therapy are one component of a multi-disciplinary discharge planning process, led by the attending physician.  Recommendations may be updated based on patient status, additional functional criteria and insurance authorization.   Assistance Recommended at Discharge Frequent or constant Supervision/Assistance  Patient  can return home with the following A little help with bathing/dressing/bathroom;Assistance with cooking/housework;Assist for transportation;Help with stairs or ramp for entrance;Direct supervision/assist for medications management;Direct supervision/assist for financial management;A little help with walking and/or transfers    Functional Status Assessment  Patient has had a recent decline in their functional status and demonstrates the ability to make significant improvements in function in a reasonable and predictable amount of time.  Equipment Recommendations  None recommended by OT    Recommendations for Other Services       Precautions / Restrictions Precautions Precautions: Fall Restrictions Weight Bearing Restrictions: No      Mobility Bed Mobility Overal bed mobility: Modified Independent                  Transfers Overall transfer level: Needs assistance Equipment used: None Transfers: Sit to/from Stand Sit to Stand: Supervision           General transfer comment: supv for safety      Balance Overall balance assessment: Needs assistance Sitting-balance support: Feet supported Sitting balance-Leahy Scale: Normal     Standing balance support: No upper extremity supported Standing balance-Leahy Scale: Fair                             ADL either performed or assessed with clinical judgement   ADL                                         General ADL Comments: SBA for ADL mobility/transfers, supervision for  ADL tasks for safety     Vision         Perception     Praxis      Pertinent Vitals/Pain Pain Assessment Pain Assessment: No/denies pain     Hand Dominance     Extremity/Trunk Assessment Upper Extremity Assessment Upper Extremity Assessment: Overall WFL for tasks assessed   Lower Extremity Assessment Lower Extremity Assessment: Overall WFL for tasks assessed   Cervical / Trunk Assessment Cervical /  Trunk Assessment: Normal   Communication Communication Communication: No difficulties   Cognition Arousal/Alertness: Awake/alert Behavior During Therapy: Flat affect Overall Cognitive Status: No family/caregiver present to determine baseline cognitive functioning                                 General Comments: Pt oriented to self, DOB, perseverates on DOB when asked about current month and year, poor safety awareness, STM, and problem solving requiring cues throughout session     General Comments  Pt received seated EOB on room air (she reports she took her nasal cannula off). On room air SpO2 in mid 70's. Reapplied and with RN clearance increased from 3L to 4L to maintain 89-91%. Left on 4L. PT denies SOB. Decrease awareness of deficits.    Exercises Other Exercises Other Exercises: Pt educated on importance of maintaining [place] for supplemental O2   Shoulder Instructions      Home Living Family/patient expects to be discharged to:: Private residence Living Arrangements: Children Available Help at Discharge: Family Type of Home: House Home Access: Stairs to enter Secretary/administrator of Steps: 5 Entrance Stairs-Rails: Left Home Layout: Two level;Bed/bath upstairs   Alternate Level Stairs-Rails: Right Bathroom Shower/Tub: Chief Strategy Officer: Standard     Home Equipment: Agricultural consultant (2 wheels);BSC/3in1   Additional Comments: No family present to verify PLOF/home setup . Pt poor historian.      Prior Functioning/Environment Prior Level of Function : Independent/Modified Independent;Patient poor historian/Family not available;Driving             Mobility Comments: pt denied use of AD, denied falls, 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 ADLs Comments: Pt reports indep with ADL, does the cleaning, son cooks, and she manages her own medication. Reports wearing 3L O2 at ALL times.        OT Problem List: Decreased activity  tolerance;Decreased safety awareness;Decreased knowledge of use of DME or AE;Impaired balance (sitting and/or standing);Cardiopulmonary status limiting activity;Decreased cognition      OT Treatment/Interventions: Therapeutic exercise;Self-care/ADL training;Therapeutic activities;Cognitive remediation/compensation;Energy conservation;DME and/or AE instruction;Patient/family education;Balance training    OT Goals(Current goals can be found in the care plan section) Acute Rehab OT Goals Patient Stated Goal: go home OT Goal Formulation: With patient Time For Goal Achievement: 11/28/22 Potential to Achieve Goals: Good ADL Goals Pt Will Transfer to Toilet: with supervision;ambulating (LRAD) Pt Will Perform Toileting - Clothing Manipulation and hygiene: with modified independence Additional ADL Goal #1: Pt will complete daily morning ADL routine with supervision and PRN VC for safety, 3/3 opportunities. Additional ADL Goal #2: Pt will utilize learned ECS with VC to initiate during ADL/mobility to minimize desat, 3/3 opportunities.  OT Frequency: Min 1X/week    Co-evaluation              AM-PAC OT "6 Clicks" Daily Activity     Outcome Measure Help from another person eating meals?: None Help from another person taking care of personal  grooming?: A Little Help from another person toileting, which includes using toliet, bedpan, or urinal?: A Little Help from another person bathing (including washing, rinsing, drying)?: A Little Help from another person to put on and taking off regular upper body clothing?: A Little Help from another person to put on and taking off regular lower body clothing?: A Little 6 Click Score: 19   End of Session Equipment Utilized During Treatment: Oxygen Nurse Communication: Mobility status;Other (comment) (O2)  Activity Tolerance: Patient tolerated treatment well Patient left: in bed;with call bell/phone within reach;with bed alarm set  OT Visit Diagnosis:  Other abnormalities of gait and mobility (R26.89)                Time: 1610-9604 OT Time Calculation (min): 29 min Charges:  OT General Charges $OT Visit: 1 Visit OT Evaluation $OT Eval Low Complexity: 1 Low OT Treatments $Self Care/Home Management : 8-22 mins  Arman Filter., MPH, MS, OTR/L ascom [phone] 11/14/22, 3:08 PM

## 2022-11-14 NOTE — TOC Initial Note (Signed)
Transition of Care [facility]) - Initial/Assessment Note    Patient Details  Name: Natalie [name] MRN: [id number] Date of Birth: Jan 01, 1956  [facility] CM/SW Contact:    Chapman Fitch, RN Phone Number: 11/14/2022, 2:35 PM  Clinical Narrative:                  Admitted for: CAP Admitted from: home with son [name] PCP: Greggory Stallion Current home health/prior home health/DME: [employer] and prn O2 through [facility]  Patient A&O x2 Call placed to son [name].  [name] states that at baseline patient has some confusion related to a past stroke, however when he spoke with her earlier this morning he felt like she had increased confusion Therapy recommending home health.  [name] in agreement and states that he does not have a preference of home health agency.  Referral made and accepted by [facility] with Frances Furbish. [name] has questions and requests to speak to MD. MD notified.  [name] states that is patient needs O2 for the ride home they can bring portable tanks.  Bedside RN notified    Expected Discharge Plan: Home w Home Health Services Barriers to Discharge: Barriers Resolved   Patient Goals and CMS Choice     Choice offered to / list presented to : Adult Children      Expected Discharge Plan and Services     Post Acute Care Choice: Home Health Living arrangements for the past 2 months: Single Family Home                                      Prior Living Arrangements/Services Living arrangements for the past 2 months: Single Family Home Lives with:: Adult Children              Current home services: DME    Activities of Daily Living Home Assistive Devices/Equipment: Environmental consultant (specify type), Eyeglasses ADL Screening (condition at time of admission) Patient's cognitive ability adequate to safely complete daily activities?: Yes Is the patient deaf or have difficulty hearing?: No Does the patient have difficulty seeing, even when wearing glasses/contacts?: No Does  the patient have difficulty concentrating, remembering, or making decisions?: No Patient able to express need for assistance with ADLs?: Yes Does the patient have difficulty dressing or bathing?: No Independently performs ADLs?: Yes (appropriate for developmental age) Does the patient have difficulty walking or climbing stairs?: No Weakness of Legs: Both Weakness of Arms/Hands: Both  Permission Sought/Granted                  Emotional Assessment              Admission diagnosis:  CAP (community acquired pneumonia) [J18.9] Community acquired pneumonia of left lower lobe of lung [J18.9] Patient Active Problem List   Diagnosis Date Noted   CAP (community acquired pneumonia) 11/12/2022   History of CVA (cerebrovascular accident) 11/12/2022   Methadone maintenance therapy patient (HCC) 11/12/2022   Acute metabolic encephalopathy 11/12/2022   Hypertensive emergency 11/12/2022   Swelling of limb 11/26/2021   Frontal lobe and executive function deficit    Hypoalbuminemia due to protein-calorie malnutrition (HCC)    Chronic pain syndrome    Dysphagia, post-stroke    Acute ischemic left MCA stroke (HCC) 10/11/2020   Aphasia due to acute cerebrovascular accident (CVA) (HCC) 10/11/2020   Cerebral thrombosis with cerebral infarction 10/10/2020   Malnutrition of moderate degree 10/01/2020  Pressure injury of skin 09/30/2020   Encephalopathy acute 09/30/2020   Altered mental status    Acute respiratory failure with hypoxia (HCC)    Encephalopathy 09/28/2020   Status epilepticus (HCC)    NSTEMI (non-ST elevated myocardial infarction) (HCC) 07/24/2017   Cataracts, bilateral 03/30/2017   COPD (chronic obstructive pulmonary disease) (HCC) 03/30/2017   Depression 03/30/2017   Drug addiction (HCC) 03/30/2017   Seizures (HCC) 03/30/2017   Chronic migraine without aura without status migrainosus, not intractable 07/26/2014   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   CKD  (chronic kidney disease) stage 3, GFR 30-59 ml/min (HCC) 08/04/2013   Edema 03/24/2011   Tobacco use disorder 03/24/2011   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   Migraine headache 01/10/2011   PCP:  Rayetta Humphrey, MD Pharmacy:   [facility] [phone] Dan Humphreys, [place] - [address] AT [facility] OF [address] [facility] [place] [address] Phone: [phone] Fax: [phone]     Social Determinants of Health (SDOH) Social History: SDOH Screenings   Food Insecurity: No Food Insecurity (11/13/2022)  Housing: Low Risk  (11/13/2022)  Transportation Needs: No Transportation Needs (11/13/2022)  Utilities: Not At Risk (11/13/2022)  Tobacco Use: Medium Risk (11/13/2022)   SDOH Interventions:     Readmission Risk Interventions     No data to display

## 2022-11-14 NOTE — Progress Notes (Addendum)
Progress Note   [name] RUE:[id number] DOB: October 07, 1955 DOA: 11/12/2022     2 DOS: the patient was seen and examined on 11/14/2022     Subjective:  Patient seen and examined at bedside this morning Mental status is much better today She is able to tell me her name she knows she is in [facility] and she is able to tell me the year. Patient is walking about without much help. I discussed discharge with patient's sons as well as her boyfriend present at bedside. They were surprised that in my presence patient acted extremely well with her mental status.  They mentioned to me that few minutes ago before I entered the room patient was confused.  After I interrogated the patient in the presence they also felt the patient was more awake.  I discussed with them that we can observe patient for 1 more night to see how she does tomorrow. Urinalysis does not show UTI. Denies chest pain nausea vomiting or abdominal pain She tells me she is much better today   Brief hospital course:   From HPI "Natalie [name] is a 67 y.o. 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  She was also noted to be confused . most of the history is given by son and ex-husband at bedside.  They report that since her stroke she has had occasional problems with word finding and getting the right words out, however over the past day it has become more pronounced and she has been saying things that did not make sense ED course and data review: On arrival BP 180/105 with pulse 77, afebrile, O2 sat 82% on room air improving to 96% on 3 L.  Labs notable for WBC 18,000, lactic acid pending.  VBG with pH 7.45 and pCO2  of 50.  CMP unremarkable.  Troponin 11.  .  EKG, personally viewed and interpreted showing sinus rhythm at 81 with nonspecific ST-T wave changes. Imaging: Chest x-ray with hazy in space and interstitial opacities in the left mid and lower lobe favor pneumonia and/or aspiration CT head with chronic atrophic and ischemic changes without acute abnormality. Patient placed on ceftriaxone and doxycycline due to penicillin And azithromycin allergy, given a fluid bolus. Hospitalist consulted for admission. "       Assessment and Plan:  CAP (community acquired pneumonia) Suspect aspiration pneumonia Acute on chronic respiratory failure with hypoxia Rocephin and doxycycline (penicillin and azithromycin allergy) Supplemental oxygen Incentive spirometry Supplemental oxygen to keep sats over 94 Aspiration precautions Diet has been advanced    Acute metabolic encephalopathy-improved Likely multifactorial and related to pneumonia, chronic methadone use.   Questionable seizure history so could be postictal CT head non acute -Neurologic checks - Diet has been advanced -Fall seizure and aspiration precautions -EEG did not show any epileptiform waves - I discussed the case with neurologist on-call Dr. Selina Cooley PT OT on board and have recommended home health at discharge  Hypertensive emergency Continue home losartan and amlodipine Hydralazine IV as needed for SBP over 170   Methadone maintenance therapy patient (HCC) Continue methadone therapy   History of CVA (cerebrovascular accident) CT head with no acute findings  Acute CVA not suspected at this time Continue clopidogrel and atorvastatin   Chronic pain syndrome Methadone resumed   COPD (chronic obstructive pulmonary disease) (HCC) Acute on chronic hypoxic respiratory failure Patient not wheezing Followed by pulmonology for stage II to lll copd.  On oxygen at 3 liters at baseline but only uses as needed  -Continue duo neb tid and as  needed, pulmicort inhaler - Continue O2 at 3 L to keep sats over 92%.  O2 sat was 82% on room air     DVT prophylaxis: Lovenox   Consults: Dr Selina Cooley. neurology   Advance Care Planning:   Code Status: Prior      Disposition Plan: Back to previous home environment     Physical Exam:   Vitals and nursing note reviewed.  Constitutional:      General: She is not in acute distress. HENT:     Head: Normocephalic and atraumatic.  Cardiovascular:     Rate and Rhythm: Normal rate and regular rhythm.     Heart sounds: Normal heart sounds.  Pulmonary:     Effort: Pulmonary effort is normal.     Breath sounds: Normal breath sounds.  Abdominal:     Palpations: Abdomen is soft.     Tenderness: There is no abdominal tenderness.  Neurological:     Mental Status: Alert and oriented in person, place and time    Data Reviewed: I have reviewed patient's laboratory results today showing  I reviewed the urinalysis result that did not show UTI Sodium of 140 potassium 3.0 which is being repleted WBC improved to 16.3 hemoglobin of 14.8   Family Communication: Discussed with patient's son [name] at bedside as well as patient's son Ebony Cargo over the phone as well as patient's boyfriend present at bedside   Disposition: Status is: Inpatient Continues to be appropriate for inpatient management given altered mental status requiring antibiotic therapy for management of pneumonia   Time spent: 50 minutes time spent taking care of patient as well as calling family and discussing patient's condition with him    Vitals:   11/13/22 2000 11/13/22 2011 11/13/22 2100 11/14/22 0430  BP: (!) 127/55  139/65 136/66  Pulse: 77  73 75  Resp: 16  16 16   Temp:  98.1 F (36.7 C) 98.1 F (36.7 C) 98 F (36.7 C)  TempSrc:  Oral Oral Oral  SpO2: 93%  93% 93%     Author: Loyce Dys, MD 11/14/2022 5:37 PM  For on call review [email or web address].

## 2022-11-14 NOTE — Care Management Important Message (Signed)
Important Message  Patient Details  Name: Natalie [name] MRN: [id number] Date of Birth: 04-Mar-1956   Medicare Important Message Given:  Yes     Olegario Messier A Yarisbel Miranda 11/14/2022, 2:56 PM

## 2022-11-14 NOTE — Evaluation (Signed)
Physical Therapy Evaluation Patient Details Name: Natalie [name] MRN: [id number] DOB: 01/31/1956 Today's Date: 11/14/2022  History of Present Illness  Pt is a 67 y.o. 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   Clinical Impression  Patient alert, agreeable to PT up in room making her bed upon PT entrance. Pt oriented to self and place as a hospital (wrong name), noted for perseveration and difficulty word finding throughout session, pt seemed aware of this deficit. She said she lives with one her sons, no AD for mobility, and performs her own ADLs.  The patient demonstrated transfers independently, and ambulated ~292ft with supervision. Attempted with and without RW, no significant improvement noted with RW. It encouraged flexed trunk and decreased gait velocity, though pt did state she likes to reach for support. BLE strength WFLs. Further skilled PT intervention is encouraged to improve pt safety and decrease falls risk.        Recommendations for follow up therapy are one component of a multi-disciplinary discharge planning process, led by the attending physician.  Recommendations may be updated based on patient status, additional functional criteria and insurance authorization.  Follow Up Recommendations       Assistance Recommended at Discharge Intermittent Supervision/Assistance  Patient can return home with the following  Direct supervision/assist for medications management;Assistance with cooking/housework;Direct supervision/assist for financial management    Equipment Recommendations None recommended by PT  Recommendations for Other Services       Functional Status Assessment  Patient has had a recent decline in their functional status and demonstrates the ability to make significant improvements in function in a reasonable and predictable amount of time.     Precautions / Restrictions Precautions Precautions: Fall Restrictions Weight Bearing Restrictions: No      Mobility  Bed Mobility               General bed mobility comments: pt standing in room    Transfers Overall transfer level: Independent Equipment used: None                    Ambulation/Gait Ambulation/Gait assistance: Supervision Gait Distance (Feet): 200 Feet Assistive device: Rolling walker (2 wheels), None         General Gait Details: attempted with an without RW, RW did not seem to improve safety, encouraged flexed trunk and decreased gait velocity  Stairs            Wheelchair Mobility    Modified Rankin (Stroke Patients Only)       Balance Overall balance assessment: Needs assistance Sitting-balance support: Feet supported Sitting balance-Leahy Scale: Normal       Standing balance-Leahy Scale: Good                               Pertinent Vitals/Pain Pain Assessment Pain Assessment: No/denies pain    Home Living Family/patient expects to be discharged to:: Private residence Living Arrangements: Children Available Help at Discharge: Family Type of Home: House Home Access: Stairs to enter Entrance Stairs-Rails: Left Entrance Stairs-Number of Steps: 5   Home Layout: Two level;Bed/bath upstairs Home Equipment: Agricultural consultant (2  wheels);BSC/3in1      Prior Function Prior Level of Function : Independent/Modified Independent;Patient poor historian/Family not available;Driving             Mobility Comments: pt denied use of AD, denied falls, 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       Hand Dominance        Extremity/Trunk Assessment   Upper Extremity Assessment Upper Extremity Assessment: Overall WFL for tasks  assessed    Lower Extremity Assessment Lower Extremity Assessment: Overall WFL for tasks assessed    Cervical / Trunk Assessment Cervical / Trunk Assessment: Normal  Communication   Communication: No difficulties  Cognition Arousal/Alertness: Awake/alert Behavior During Therapy: WFL for tasks assessed/performed Overall Cognitive Status: No family/caregiver present to determine baseline cognitive functioning                                 General Comments: pt oriented to self, "hospital" (but wrong name)        General Comments      Exercises     Assessment/Plan    PT Assessment Patient needs continued PT services  PT Problem List Decreased activity tolerance;Decreased balance       PT Treatment Interventions DME instruction;Therapeutic activities;Gait training;Therapeutic exercise;Patient/family education;Stair training;Balance training;Functional mobility training;Neuromuscular re-education    PT Goals (Current goals can be found in the Care Plan section)  Acute Rehab PT Goals Patient Stated Goal: to go home PT Goal Formulation: With patient Time For Goal Achievement: 11/28/22 Potential to Achieve Goals: Good    Frequency Min 2X/week     Co-evaluation               AM-PAC PT "6 Clicks" Mobility  Outcome Measure Help needed turning from your back to your side while in a flat bed without using bedrails?: None Help needed moving from lying on your back to sitting on the side of a flat bed without using bedrails?: None Help needed moving to and from a bed to a chair (including a wheelchair)?: None Help needed standing up from a chair using your arms (e.g., wheelchair or bedside chair)?: None Help needed to walk in hospital room?: None Help needed climbing 3-5 steps with a railing? : None 6 Click Score: 24    End of Session   Activity Tolerance: Patient tolerated treatment well Patient left: Other (comment) (sitting EOB)   PT Visit  Diagnosis: Other abnormalities of gait and mobility (R26.89)    Time: 1349-1400 PT Time Calculation (min) (ACUTE ONLY): 11 min   Charges:   PT Evaluation $PT Eval Low Complexity: 1 Low          Olga Coaster PT, DPT 2:12 PM,11/14/22

## 2022-11-15 DIAGNOSIS — J189 Pneumonia, unspecified organism: Secondary | ICD-10-CM | POA: Diagnosis not present

## 2022-11-15 LAB — CBC WITH DIFFERENTIAL/PLATELET
Abs Immature Granulocytes: 0.04 10*3/uL (ref 0.00–0.07)
Basophils Absolute: 0 10*3/uL (ref 0.0–0.1)
Basophils Relative: 0 %
Eosinophils Absolute: 0 10*3/uL (ref 0.0–0.5)
Eosinophils Relative: 0 %
HCT: 46.8 % — ABNORMAL HIGH (ref 36.0–46.0)
Hemoglobin: 15 g/dL (ref 12.0–15.0)
Immature Granulocytes: 0 %
Lymphocytes Relative: 7 %
Lymphs Abs: 0.8 10*3/uL (ref 0.7–4.0)
MCH: 30.4 pg (ref 26.0–34.0)
MCHC: 32.1 g/dL (ref 30.0–36.0)
MCV: 94.9 fL (ref 80.0–100.0)
Monocytes Absolute: 0.9 10*3/uL (ref 0.1–1.0)
Monocytes Relative: 8 %
Neutro Abs: 9.2 10*3/uL — ABNORMAL HIGH (ref 1.7–7.7)
Neutrophils Relative %: 85 %
Platelets: 169 10*3/uL (ref 150–400)
RBC: 4.93 MIL/uL (ref 3.87–5.11)
RDW: 14.1 % (ref 11.5–15.5)
WBC: 10.9 10*3/uL — ABNORMAL HIGH (ref 4.0–10.5)
nRBC: 0 % (ref 0.0–0.2)

## 2022-11-15 LAB — BASIC METABOLIC PANEL
Anion gap: 7 (ref 5–15)
BUN: 18 mg/dL (ref 8–23)
CO2: 28 mmol/L (ref 22–32)
Calcium: 8.7 mg/dL — ABNORMAL LOW (ref 8.9–10.3)
Chloride: 100 mmol/L (ref 98–111)
Creatinine, Ser: 1.02 mg/dL — ABNORMAL HIGH (ref 0.44–1.00)
GFR, Estimated: >60 mL/min (ref >60)
Glucose, Bld: 102 mg/dL — ABNORMAL HIGH (ref 70–99)
Potassium: 3.4 mmol/L — ABNORMAL LOW (ref 3.5–5.1)
Sodium: 135 mmol/L (ref 135–145)

## 2022-11-15 NOTE — Progress Notes (Signed)
Mobility Specialist - Progress Note  Pre-mobility: HR, BP, SpO2 (90) During mobility:  SpO2 on 4L (79) Post-mobility: SPO2 on 4L recovered to (91) for 4-5 minute then put back on 3L      11/15/22 1613  Mobility  Activity Ambulated with assistance in hallway  Level of Assistance Contact guard assist, steadying assist  Assistive Device None  Distance Ambulated (ft) 160 ft  Range of Motion/Exercises Active  Activity Response Tolerated well  Mobility Referral Yes  $Mobility charge 1 Mobility  Mobility Specialist Start Time (ACUTE ONLY) 1545  Mobility Specialist Stop Time (ACUTE ONLY) 1612  Mobility Specialist Time Calculation (min) (ACUTE ONLY) 27 min   Pt resting in bed on 3L upon entry. Pt STS and ambulates to hallway around NS for 1 lap with HHA and CGA for stability. Pt desat to 79% pushed to 6L temporarily to recover O2 at 80 ft (86%) Pt bumped back down to 4L and pt returned to room recovered to 90% after 4-5 minutes of rest EOB with VC for deep breathing. Pt left in bed with needs in reach. RN notified of ambulation session desat.   Johnathan Hausen Mobility Specialist 11/15/22, 4:34 PM

## 2022-11-15 NOTE — Progress Notes (Signed)
Progress Note   [name] XBJ:[id number] DOB: 07-Dec-1955 DOA: 11/12/2022     3 DOS: the patient was seen and examined on 11/15/2022     Subjective:  Patient seen and examined at bedside this morning Patient's husband asked her a series of questions which she was able to answer most of them in my presence.  At this point I believe patient has some underlying dementia rather than confusion. I have discussed with the source patient with patient's husband as well as patient's son [name] over the phone. I have also discussed with neurologist Dr. Selina Cooley. I have requested for PT to evaluate patient walking up and down the stairs to make sure patient is safe for going home since she lives in a two-story building.   Brief hospital course:   From HPI "Natalie [name] is a 67 y.o. 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  She was also noted to be confused . most of the history is given by son and ex-husband at bedside.  They report that since her stroke she has had occasional problems with word finding and getting the right words out, however over the past day it has become more pronounced and she has been saying things that did not make sense ED course and data review: On arrival BP 180/105 with pulse 77, afebrile, O2 sat 82% on room air improving to 96% on 3 L.  Labs notable for WBC 18,000, lactic acid pending.  VBG with pH 7.45 and pCO2 of 50.  CMP unremarkable.  Troponin 11.  .  EKG, personally viewed and interpreted showing sinus rhythm at 81 with nonspecific ST-T wave changes. Imaging: Chest x-ray with hazy in space and interstitial opacities in the left mid and lower lobe favor pneumonia  and/or aspiration CT head with chronic atrophic and ischemic changes without acute abnormality. Patient placed on ceftriaxone and doxycycline due to penicillin And azithromycin allergy, given a fluid bolus. Hospitalist consulted for admission. "       Assessment and Plan:  CAP (community acquired pneumonia) Suspect aspiration pneumonia Acute on chronic respiratory failure with hypoxia Continue Rocephin and doxycycline (penicillin and azithromycin allergy) Supplemental oxygen Incentive spirometry Supplemental oxygen to keep sats over 94 Aspiration precautions Diet has been advanced     Acute metabolic encephalopathy-improved Likely multifactorial and related to pneumonia, chronic methadone use.   Questionable seizure history so could be postictal CT head non acute -Neurologic checks - Diet has been advanced -Fall seizure and aspiration precautions -EEG did not show any epileptiform waves - I discussed the case with neurologist on-call Dr. Selina Cooley PT OT on board and have recommended home health at discharge I also discussed patient's mental status findings with neurologist Dr. Zara Chess, after discussion and review of patient's chart and neurologist believes that patient may have underlying dementia and will need an outpatient neurology follow-up.   Hypertensive emergency Continue home losartan and amlodipine Hydralazine IV as needed for SBP over 170   Methadone maintenance therapy patient (HCC) Continue methadone therapy   History of CVA (cerebrovascular accident) CT head with no acute findings Acute CVA not suspected at this time Continue clopidogrel and atorvastatin  Chronic pain syndrome Methadone resumed   COPD (chronic obstructive pulmonary disease) (HCC) Acute on chronic hypoxic respiratory failure Patient not wheezing Followed by pulmonology for stage II to lll copd.  On oxygen at 3 liters at baseline but only uses as needed  -Continue duo neb tid and as  needed, pulmicort inhaler - Continue O2 at 3 L to keep sats over 92%.  O2 sat was 82% on room air on presentation     DVT prophylaxis: Lovenox   Consults: Dr Selina Cooley. neurology   Advance Care Planning:   Code Status: Prior      Disposition Plan: Back to previous home environment     Physical Exam:   Vitals and nursing note reviewed.  Constitutional:      General: She is not in acute distress. HENT:     Head: Normocephalic and atraumatic.  Cardiovascular:     Rate and Rhythm: Normal rate and regular rhythm.     Heart sounds: Normal heart sounds.  Pulmonary:     Effort: Pulmonary effort is normal.     Breath sounds: Normal breath sounds.  Abdominal:     Palpations: Abdomen is soft.     Tenderness: There is no abdominal tenderness.  Neurological:     Mental Status: Alert and oriented in person, place and time       Data Reviewed: I have reviewed patient's laboratory results today showing  Sodium of 135 potassium 3.4 glucose 102 CBC 10.9 hemoglobin 15.0.   Family Communication: Discussed with patient's son [name] over the phone as well as patient's husband present at bedside.  Husband tells me they are actually married but they have been separated for 25 years.  Disposition: Status is: Inpatient Continues to be appropriate for inpatient management given altered mental status requiring antibiotic therapy for management of pneumonia   Time spent: 45 minutes time spent taking care of patient as well as calling family and discussing patient's condition with them.      Vitals:   11/14/22 1955 11/15/22 0359 11/15/22 0812 11/15/22 0852  BP: 132/76 111/76 110/60 120/83  Pulse: 85 70 75 74  Resp: 16 20 18 20   Temp: 98.2 F (36.8 C) (!) 97.5 F (36.4 C) 98.1 F (36.7 C) (!) 97.5 F (36.4 C)  TempSrc: Oral Oral Oral   SpO2: 94% 92% 93% 93%    Author: Loyce Dys, MD 11/15/2022 2:05 PM  For on call review [email or web address].

## 2022-11-15 NOTE — Progress Notes (Addendum)
Physical Therapy Treatment Patient Details Name: Natalie [name] MRN: [id number] DOB: 1955/07/21 Today's Date: 11/15/2022   History of Present Illness Pt is a 67 y.o. 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    PT Comments    Pt tolerated treatment well today, with overall continued mobility and progress limited secondary to decreased lung tolerance to mobility. Per pt and family member, Natalie [name], report: pt had just finished walking multiple laps around the unit with nursing ~76min prior to PT entry.  Pt was mod I for bed mobility, ambulation to bathroom, and toileting hygiene/ADL's. SBA was provided during functional mobility with RW for monitoring of vitals. Required titration to 4L for ambulation. Improved activity tolerance noted with use of RW, however, pt only able to tolerate 18ft of ambulation before desaturation. CGA for safety provided with stair navigation; she demonstrates adequate foot clearance, safety, and balance.  Pt overall limited today secondary to multiple episodes of O2 desaturation during session. Currently required titration to 4L with SpO2 at 91-92% when PT exited the room. Please see below (general comments) for details on O2 saturation. Tolerance for activity today likely influenced by walking prior to PT session. Pt will continue to benefit from skilled acute PT services while hospitalized. Will continue per POC.      Recommendations for follow up therapy are one component of a multi-disciplinary discharge planning process, led by the attending physician.  Recommendations may be updated based on patient status, additional functional criteria and insurance  authorization.  Follow Up Recommendations       Assistance Recommended at Discharge Intermittent Supervision/Assistance  Patient can return home with the following Direct supervision/assist for medications management;Assistance with cooking/housework;Direct supervision/assist for financial management   Equipment Recommendations  None recommended by PT       Precautions / Restrictions Precautions Precaution Comments: SpO2 >/= 92-94% Restrictions Weight Bearing Restrictions: No     Mobility  Bed Mobility Overal bed mobility: Modified Independent                  Transfers       Sit to Stand: Supervision           General transfer comment: STS transfers from EOB and recliner; mod I for STS with toileting    Ambulation/Gait Ambulation/Gait assistance: Supervision Gait Distance (Feet): 60 Feet Assistive device: Rolling walker (2 wheels), None         General Gait Details: RW utilized for energy conservation. Demonstrates slowed cadence with decreased step length/foot clearance bil. Desat to 84% after 2ft on 4L. Able to recover within a minute to 91%, seated with cues for pursed lip breathing.   Stairs Stairs: Yes Stairs assistance: Min guard Stair Management: One rail Left, Step to pattern Number of Stairs: 5 General stair comments: CGA for safety. Demonstrates adequate foot clearance. Labored breathing with SpO2 desat to 86%. Required ~13min for recovery >90% on 4L. Cues for RW proximity.      Balance Overall balance assessment: Needs assistance Sitting-balance support: Feet supported Sitting balance-Leahy Scale: Normal     Standing balance support: Bilateral upper extremity supported Standing balance-Leahy Scale: Good Standing balance comment: standing balance in RW  Cognition Arousal/Alertness: Awake/alert Behavior During Therapy: WFL for tasks assessed/performed Overall Cognitive Status: Within  Functional Limits for tasks assessed                                 General Comments: A&O x3; max cues and choices for time. Unable to state year with choices, perseverating on month.        Exercises Other Exercises Other Exercises: Pt and family member Natalie [name]) educated re: PT role/POC, DC recommendations, pulse oximeter at home, pacing, energy conservation, use of DME for energy conservation, sleeping on couch downstairs if needed, therapeutic SpO2 levels, pursed lip breathing.    General Comments General comments (skin integrity, edema, etc.): Ambulate to bathroom and performs toileting ADL's and hygiene IND. SpO2 on 3L upon entry; desat to 88% after ambulating to/from bathroom without AD. Unable to recover >90% therefore titrated to 4L. Desat to 84% after walking 74ft. Recovers within a minute with seated rest break and cues for pursed lip breathing. Desat from 92% to 86% during stair training. Recovers to 91-92% after of sitting with cues for pursed lip breathing.      Pertinent Vitals/Pain Pain Assessment Pain Assessment: No/denies pain     PT Goals (current goals can now be found in the care plan section) Acute Rehab PT Goals Patient Stated Goal: to go home PT Goal Formulation: With patient Time For Goal Achievement: 11/28/22 Potential to Achieve Goals: Good Progress towards PT goals: Progressing toward goals    Frequency    Min 2X/week      PT Plan Current plan remains appropriate       AM-PAC PT "6 Clicks" Mobility   Outcome Measure  Help needed turning from your back to your side while in a flat bed without using bedrails?: None Help needed moving from lying on your back to sitting on the side of a flat bed without using bedrails?: None Help needed moving to and from a bed to a chair (including a wheelchair)?: None Help needed standing up from a chair using your arms (e.g., wheelchair or bedside chair)?: A Little Help needed to walk in  hospital room?: A Little Help needed climbing 3-5 steps with a railing? : A Little 6 Click Score: 21    End of Session Equipment Utilized During Treatment: Gait belt;Oxygen (3-4L O2 via [place]) Activity Tolerance: Patient tolerated treatment well Patient left: in chair;with call bell/phone within reach;with family/visitor present (pt safe for ambulation in room) Nurse Communication: Mobility status (O2 levels) PT Visit Diagnosis: Other abnormalities of gait and mobility (R26.89)     Time: 9629-5284 PT Time Calculation (min) (ACUTE ONLY): 30 min  Charges:  $Therapeutic Exercise: 8-22 mins $Therapeutic Activity: 8-22 mins                      Vira Blanco, PT, DPT 1:36 PM,11/15/22 Physical Therapist - [place] [facility]

## 2022-11-16 DIAGNOSIS — R4789 Other speech disturbances: Secondary | ICD-10-CM | POA: Diagnosis not present

## 2022-11-16 DIAGNOSIS — G9341 Metabolic encephalopathy: Secondary | ICD-10-CM | POA: Diagnosis not present

## 2022-11-16 LAB — CBC WITH DIFFERENTIAL/PLATELET
Abs Immature Granulocytes: 0.04 10*3/uL (ref 0.00–0.07)
Basophils Absolute: 0 10*3/uL (ref 0.0–0.1)
Basophils Relative: 0 %
Eosinophils Absolute: 0 10*3/uL (ref 0.0–0.5)
Eosinophils Relative: 0 %
HCT: 44 % (ref 36.0–46.0)
Hemoglobin: 14.3 g/dL (ref 12.0–15.0)
Immature Granulocytes: 0 %
Lymphocytes Relative: 9 %
Lymphs Abs: 0.9 10*3/uL (ref 0.7–4.0)
MCH: 30.1 pg (ref 26.0–34.0)
MCHC: 32.5 g/dL (ref 30.0–36.0)
MCV: 92.6 fL (ref 80.0–100.0)
Monocytes Absolute: 0.8 10*3/uL (ref 0.1–1.0)
Monocytes Relative: 8 %
Neutro Abs: 8.4 10*3/uL — ABNORMAL HIGH (ref 1.7–7.7)
Neutrophils Relative %: 83 %
Platelets: 212 10*3/uL (ref 150–400)
RBC: 4.75 MIL/uL (ref 3.87–5.11)
RDW: 13.7 % (ref 11.5–15.5)
WBC: 10.1 10*3/uL (ref 4.0–10.5)
nRBC: 0 % (ref 0.0–0.2)

## 2022-11-16 LAB — TROPONIN I (HIGH SENSITIVITY): Troponin I (High Sensitivity): 4 ng/L (ref <18)

## 2022-11-16 NOTE — TOC Progression Note (Signed)
Transition of Care [facility]) - Progression Note    Patient Details  Name: Natalie [name] MRN: [id number] Date of Birth: [birth year]  [facility] CM/SW Contact  Bing Quarry, RN Phone Number: 11/16/2022, 11:07 AM  Clinical Narrative: 5/12: Spoke with son, [name], per Unit RN request over some concerns. 20 minute phone call with [name], reviewing discharge plan and expression of concerns that patient is worse off endurance wise than prior to admission and though he had taken care of her for two years since stroke he feels he is unable to provide the level of attention/supervision/assistance she will require at this time.   RN CM discussed some issues around going home, such as looking a private care services, monitoring her oxygenation with a home pulse oximeter after reading the PT notes on her activity oxygenation documentation. He wants to look at all options and would like PT to evaluate for possible STR based on his assessment that he is unable to provide patient with a safe level of care/assistance/supervision. Updated Unit RN who also states patient is more confused today as well as Provider in the secure chat, requesting PT take another look with this information. Gabriel Cirri RN CM [facility]) [phone]     Expected Discharge Plan: Home w Home Health Services Barriers to Discharge: Barriers Resolved  Expected Discharge Plan and Services     Post Acute Care Choice: Home Health Living arrangements for the past 2 months: Single Family Home                                       Social Determinants of Health (SDOH) Interventions SDOH Screenings   Food Insecurity: No Food Insecurity (11/13/2022)  Housing: Low Risk  (11/13/2022)  Transportation Needs: No Transportation Needs (11/13/2022)  Utilities: Not At Risk (11/13/2022)  Tobacco Use: Medium Risk (11/13/2022)    Readmission Risk Interventions     No data to display

## 2022-11-16 NOTE — Progress Notes (Signed)
Progress Note   [name] ZOX:[id number] DOB: 03-12-1956 DOA: 11/12/2022     4 DOS: the patient was seen and examined on 11/16/2022     Subjective:  Patient seen and examined at bedside this morning Patient appeared more confused today than yesterday I have requested for neurologist to evaluate patient I have updated patient's son [name] Given patient's change in mental status physical therapy will reevaluate when patient is medically ready for discharge.   Brief hospital course:   From HPI "Natalie [name] is a 67 y.o. 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  She was also noted to be confused . most of the history is given by son and ex-husband at bedside.  They report that since her stroke she has had occasional problems with word finding and getting the right words out, however over the past day it has become more pronounced and she has been saying things that did not make sense ED course and data review: On arrival BP 180/105 with pulse 77, afebrile, O2 sat 82% on room air improving to 96% on 3 L.  Labs notable for WBC 18,000, lactic acid pending.  VBG with pH 7.45 and pCO2 of 50.  CMP unremarkable.  Troponin 11.  .  EKG, personally viewed and interpreted showing sinus rhythm at 81 with nonspecific ST-T wave changes. Imaging: Chest x-ray with hazy in space and interstitial opacities in the left mid and lower lobe favor pneumonia and/or aspiration CT head with chronic atrophic and ischemic changes without acute abnormality. Patient placed on ceftriaxone and doxycycline due to penicillin And azithromycin allergy, given a fluid bolus. Hospitalist consulted for admission. "        Assessment and Plan:  CAP (community acquired pneumonia) Suspect aspiration pneumonia Acute on chronic respiratory failure with hypoxia Continue Rocephin and doxycycline (penicillin and azithromycin allergy) Supplemental oxygen Incentive spirometry Supplemental oxygen to keep sats over 94 Aspiration precautions Diet has been advanced     Acute metabolic encephalopathy-improved Likely multifactorial and related to pneumonia, chronic methadone use.   Questionable seizure history so could be postictal CT head non acute -Neurologic checks - Diet has been advanced -Fall seizure and aspiration precautions -EEG did not show any epileptiform waves - I discussed the case with neurologist on-call Dr. Selina Cooley I have updated patient's son [name] Given patient's change in mental status physical therapy will reevaluate when patient is medically ready for discharge.   Hypertensive emergency Continue home losartan and amlodipine Hydralazine IV as needed for SBP over 170   Methadone maintenance therapy patient (HCC) Continue methadone therapy   History of CVA (cerebrovascular accident) CT head with no acute findings Acute CVA not suspected at this time Continue clopidogrel and atorvastatin   Chronic pain syndrome Methadone resumed   COPD (chronic obstructive pulmonary disease) (HCC) Acute on chronic hypoxic respiratory failure Patient not wheezing Followed by pulmonology for stage II to lll copd.  On oxygen at 3 liters at baseline but only uses as needed  -Continue duo neb tid and as needed, pulmicort inhaler - Continue O2 at 3 L to keep sats over 92%.  O2 sat was 82% on room air on presentation  DVT prophylaxis: Lovenox   Consults: Dr Selina Cooley. neurology   Advance Care Planning:   Code Status: Prior      Disposition Plan: Back to previous home environment     Physical Exam:   Vitals and nursing note reviewed.  Constitutional:      General: She is not in acute  distress. HENT:     Head: Normocephalic and atraumatic.  Cardiovascular:     Rate and Rhythm: Normal rate and regular rhythm.     Heart sounds: Normal heart sounds.  Pulmonary:     Effort: Pulmonary effort is normal.     Breath sounds: Normal breath sounds.  Abdominal:     Palpations: Abdomen is soft.     Tenderness: There is no abdominal tenderness.  Neurological:     Mental Status: Alert and oriented in person, place and time       Data Reviewed: No labs available today for review, I have requested CBC and BMP   Family Communication: Discussed with patient's son [name] over the phone as well as patient's husband present at bedside.  Husband tells me they are actually married but they have been separated for 25 years.   Disposition: Status is: Inpatient Continues to be appropriate for inpatient management given altered mental status requiring antibiotic therapy for management of pneumonia   Time spent: 48 minutes time spent taking care of patient as well as calling family and discussing patient's condition with them.       Vitals:   11/16/22 0309 11/16/22 0915 11/16/22 0956 11/16/22 1520  BP: 124/77 121/84 (!) 149/73 120/81  Pulse: 80 83 85 80  Resp: 16 18 18 18   Temp: 98.4 F (36.9 C) 98.7 F (37.1 C)  98.3 F (36.8 C)  TempSrc: Oral Oral  Oral  SpO2: 92% 91% 94% 90%     Author: Loyce Dys, MD 11/16/2022 4:56 PM  For on call review [email or web address].

## 2022-11-17 ENCOUNTER — Inpatient Hospital Stay: Payer: BC Managed Care – PPO

## 2022-11-17 DIAGNOSIS — G9341 Metabolic encephalopathy: Secondary | ICD-10-CM | POA: Diagnosis not present

## 2022-11-17 LAB — BASIC METABOLIC PANEL
Anion gap: 10 (ref 5–15)
BUN: 15 mg/dL (ref 8–23)
CO2: 28 mmol/L (ref 22–32)
Calcium: 8.7 mg/dL — ABNORMAL LOW (ref 8.9–10.3)
Chloride: 100 mmol/L (ref 98–111)
Creatinine, Ser: 1.06 mg/dL — ABNORMAL HIGH (ref 0.44–1.00)
GFR, Estimated: 58 mL/min — ABNORMAL LOW (ref >60)
Glucose, Bld: 146 mg/dL — ABNORMAL HIGH (ref 70–99)
Potassium: 2.9 mmol/L — ABNORMAL LOW (ref 3.5–5.1)
Sodium: 138 mmol/L (ref 135–145)

## 2022-11-17 LAB — CBC WITH DIFFERENTIAL/PLATELET
Abs Immature Granulocytes: 0.05 10*3/uL (ref 0.00–0.07)
Basophils Absolute: 0 10*3/uL (ref 0.0–0.1)
Basophils Relative: 0 %
Eosinophils Absolute: 0.2 10*3/uL (ref 0.0–0.5)
Eosinophils Relative: 3 %
HCT: 37.9 % (ref 36.0–46.0)
Hemoglobin: 12.4 g/dL (ref 12.0–15.0)
Immature Granulocytes: 1 %
Lymphocytes Relative: 15 %
Lymphs Abs: 0.9 10*3/uL (ref 0.7–4.0)
MCH: 30.8 pg (ref 26.0–34.0)
MCHC: 32.7 g/dL (ref 30.0–36.0)
MCV: 94.3 fL (ref 80.0–100.0)
Monocytes Absolute: 0.7 10*3/uL (ref 0.1–1.0)
Monocytes Relative: 12 %
Neutro Abs: 4 10*3/uL (ref 1.7–7.7)
Neutrophils Relative %: 69 %
Platelets: 210 10*3/uL (ref 150–400)
RBC: 4.02 MIL/uL (ref 3.87–5.11)
RDW: 13.8 % (ref 11.5–15.5)
WBC: 5.8 10*3/uL (ref 4.0–10.5)
nRBC: 0 % (ref 0.0–0.2)

## 2022-11-17 MED ORDER — POTASSIUM CHLORIDE CRYS ER 20 MEQ PO TBCR
40.0000 meq | EXTENDED_RELEASE_TABLET | ORAL | Status: AC
  Administered 2022-11-17 (×3): 40 meq via ORAL
  Filled 2022-11-17 (×3): qty 2

## 2022-11-17 MED ORDER — ALPRAZOLAM 0.5 MG PO TABS
0.5000 mg | ORAL_TABLET | Freq: Once | ORAL | Status: AC
  Administered 2022-11-17: 0.5 mg via ORAL
  Filled 2022-11-17: qty 1

## 2022-11-17 NOTE — Progress Notes (Signed)
Pt came down to MRI. Pt refused MRI. 5 minutes spent trying to explain the clinical importance of MRI. Pt refused. Additional education was provided to inform patient.. Pt stated she did not wish to have MRI. Pt was asked if she was claustrophobic? She replied no. Pt stated she does not feel this test is necessary. Pt was sent back to the floor. Rn notified and direct message was sent to Ordering MD.

## 2022-11-17 NOTE — Plan of Care (Signed)
I was asked to check on the MRI by the outgoing neurologist in signout. Patient refusing MRI.  I will reach out to the primary team to see if they can figure out what her concern is about getting the MRI.  Without getting an MRI, unfortunately there is nothing additional that I have to add in her care. I will follow back after the MRI is completed.   -- Milon Dikes, MD Neurologist [facility] Pager: [phone]

## 2022-11-17 NOTE — Care Management Important Message (Signed)
Important Message  Patient Details  Name: Natalie [name] MRN: [id number] Date of Birth: 08-Mar-1956   Medicare Important Message Given:  Yes     Olegario Messier A Amye Grego 11/17/2022, 2:22 PM

## 2022-11-17 NOTE — Progress Notes (Signed)
Occupational Therapy Treatment Patient Details Name: Natalie [name] MRN: [id number] DOB: 07/12/1955 Today's Date: 11/17/2022   History of present illness Pt is a 67 y.o. 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   OT comments  Patient received supine in bed and agreeable to OT. Tx session targeted increasing activity tolerance for improved ADL completion. Pt completed bed mobility with Mod I this date. Pt endorsed needing to use the bathroom. She was able to stand from EOB with supervision and then complete functional mobility to the bathroom with CGA-supervision and no AD (pt deferred use of RW). Pt with continent void in the toilet and completed peri care in sitting. Pt then going to the sink to engage in standing grooming tasks. Pt denied SOB with activity, however, SpO2 dropping to 80s on 3L. SpO2 improved with seated rest break and PLB. Pt left as received with all needs in reach. Pt is making progress toward goal completion. D/C recommendation remains appropriate. OT will continue to follow acutely.    Recommendations for follow up therapy are one component of a multi-disciplinary discharge planning process, led by the attending physician.  Recommendations may be updated based on patient status, additional functional criteria and insurance authorization.    Assistance Recommended at Discharge Frequent or constant Supervision/Assistance  Patient can return home with the following  A little help with bathing/dressing/bathroom;Assistance with cooking/housework;Assist for transportation;Help with stairs or ramp for entrance;Direct supervision/assist for medications management;Direct  supervision/assist for financial management;A little help with walking and/or transfers   Equipment Recommendations  None recommended by OT    Recommendations for Other Services      Precautions / Restrictions Precautions Precautions: Fall Precaution Comments: SpO2 >/= 92-94% Restrictions Weight Bearing Restrictions: No       Mobility Bed Mobility Overal bed mobility: Modified Independent                  Transfers Overall transfer level: Needs assistance Equipment used: None Transfers: Sit to/from Stand Sit to Stand: Supervision           General transfer comment: STS from EOB and toilet     Balance Overall balance assessment: Needs assistance Sitting-balance support: Feet supported Sitting balance-Leahy Scale: Normal     Standing balance support: Single extremity supported, During functional activity Standing balance-Leahy Scale: Good       ADL either performed or assessed with clinical judgement   ADL Overall ADL's : Needs assistance/impaired     Grooming: Supervision/safety;Standing;Set up;Wash/dry face;Wash/dry hands;Brushing hair                   Toilet Transfer: Regular Toilet;Grab bars;Supervision/safety   Toileting- Clothing Manipulation and Hygiene: Set up;Supervision/safety;Sitting/lateral lean Toileting - Clothing Manipulation Details (indicate cue type and reason): for peri care after continent void in toilet     Functional mobility during ADLs: Min guard;Supervision/safety (~47ft to the bathroom, pt deferred use of RW, CGA for safety)      Extremity/Trunk Assessment Upper Extremity Assessment Upper Extremity Assessment: Overall WFL for tasks assessed   Lower Extremity Assessment Lower Extremity Assessment: Overall WFL for tasks assessed        Vision Baseline Vision/History: 1 Wears  glasses Patient Visual Report: No change from baseline     Perception     Praxis      Cognition Arousal/Alertness:  Awake/alert Behavior During Therapy: Flat affect Overall Cognitive Status: No family/caregiver present to determine baseline cognitive functioning              Exercises Other Exercises Other Exercises: Education provided re: energy conservation techniques (rest breaks, PLB)    Shoulder Instructions       General Comments Pt on 3L O2 via  t/o. Desatting to 80s with activity, rest break required with VC for PLB. Pt denied SOB.    Pertinent Vitals/ Pain       Pain Assessment Pain Assessment: No/denies pain  Home Living              Prior Functioning/Environment              Frequency  Min 1X/week        Progress Toward Goals  OT Goals(current goals can now be found in the care plan section)  Progress towards OT goals: Progressing toward goals  Acute Rehab OT Goals Patient Stated Goal: go home OT Goal Formulation: With patient Time For Goal Achievement: 11/28/22 Potential to Achieve Goals: Good  Plan Discharge plan remains appropriate;Frequency remains appropriate    Co-evaluation                 AM-PAC OT "6 Clicks" Daily Activity     Outcome Measure   Help from another person eating meals?: None Help from another person taking care of personal grooming?: A Little Help from another person toileting, which includes using toliet, bedpan, or urinal?: A Little Help from another person bathing (including washing, rinsing, drying)?: A Little Help from another person to put on and taking off regular upper body clothing?: A Little Help from another person to put on and taking off regular lower body clothing?: A Little 6 Click Score: 19    End of Session Equipment Utilized During Treatment: Oxygen  OT Visit Diagnosis: Other abnormalities of gait and mobility (R26.89)   Activity Tolerance Patient tolerated treatment well   Patient Left in bed;with call bell/phone within reach;with bed alarm set   Nurse Communication Mobility status         Time: 1914-7829 OT Time Calculation (min): 11 min  Charges: OT General Charges $OT Visit: 1 Visit OT Treatments $Self Care/Home Management : 8-22 mins  [facility] MS, OTR/L ascom [phone]  11/17/22, 6:01 PM

## 2022-11-17 NOTE — Consult Note (Signed)
NEUROLOGY CONSULTATION NOTE   Date of service: Nov 17, 2022 Patient Name: Natalie [name] MRN:  [id number] DOB:  1956-02-13 Reason for consult: word-finding difficulty Requesting physician: Dr. Rosezetta Schlatter _ _ _   _ __   _ __ _ _  __ __   _ __   __ _  History of Present Illness   This is a 67 yo woman with hx COPD, chronic pain, 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 During her hospitalization she had had fluctuating confusion during which times her word-finding difficulty (chronic since her prior stroke) seems worse than others. EEG showed only mild diffuse slowing. Family is concerned about the word-finding difficulties and wonder if she could have had another stroke. Overall her mental status is improving since admission with tx of her pneumonia.   ROS   Per HPI: all other systems reviewed and are negative  Past History   I have reviewed the following:  Past Medical History:  Diagnosis Date   Cataracts, bilateral    Chickenpox cataracts   COPD (chronic obstructive pulmonary disease) (HCC)    Depression    Drug addiction (HCC)    Headache    Hepatitis C virus infection    Hepatitis C without hepatic coma    History of chicken pox    History of colon polyps    Seizures (HCC)    Stroke [facility])    Past Surgical History:  Procedure Laterality Date   APPENDECTOMY     CHOLECYSTECTOMY     COLONOSCOPY WITH PROPOFOL N/A 04/30/2015   Procedure: COLONOSCOPY WITH PROPOFOL;  Surgeon: Elnita Maxwell, MD;  Location: [facility] ENDOSCOPY;  Service: Endoscopy;  Laterality: N/A;   COLONOSCOPY WITH PROPOFOL N/A 07/13/2017   Procedure: COLONOSCOPY WITH PROPOFOL;  Surgeon: Scot Jun, MD;  Location: [facility] ENDOSCOPY;  Service: Endoscopy;  Laterality: N/A;   ESOPHAGOGASTRODUODENOSCOPY (EGD) WITH PROPOFOL N/A 07/13/2017   Procedure: ESOPHAGOGASTRODUODENOSCOPY (EGD) WITH PROPOFOL;  Surgeon: Scot Jun,  MD;  Location: [facility] ENDOSCOPY;  Service: Endoscopy;  Laterality: N/A;   EYE SURGERY     LOWER EXTREMITY ANGIOGRAPHY Right 01/01/2022   Procedure: Lower Extremity Angiography;  Surgeon: Annice Needy, MD;  Location: [facility] INVASIVE CV LAB;  Service: Cardiovascular;  Laterality: Right;   Family History  Problem Relation Age of Onset   Breast cancer Paternal Grandmother    Social History   Socioeconomic History   Marital status: Married    Spouse name: Not on file   Number of children: 2   Years of education: GED   Highest education level: Not on file  Occupational History   Not on file  Tobacco Use   Smoking status: Former    Packs/day: 0.50    Years: 41.00    Additional pack years: 0.00    Total pack years: 20.50    Types: Cigarettes    Quit date: 10/27/2015    Years since quitting: 7.0   Smokeless tobacco: Never  Vaping Use   Vaping Use: Never used  Substance and Sexual Activity   Alcohol use: No   Drug use: No   Sexual activity: Not Currently  Other Topics Concern   Not on file  Social History Narrative   03/14/21 son with her now, he lives 20 min away   significant other recently passed away   Social Determinants of Health   Financial Resource Strain: Not on file  Food Insecurity: No Food Insecurity (11/13/2022)  Hunger Vital Sign    Worried About Running Out of Food in the Last Year: Never true    Ran Out of Food in the Last Year: Never true  Transportation Needs: No Transportation Needs (11/13/2022)   PRAPARE - Administrator, Civil Service (Medical): No    Lack of Transportation (Non-Medical): No  Physical Activity: Not on file  Stress: Not on file  Social Connections: Not on file   Allergies  Allergen Reactions   Sulfasalazine Shortness Of Breath   Aspirin    Darvon [Propoxyphene]    Iodine    Penicillins    Sulfa Antibiotics    Zithromax [Azithromycin]     Medications   Medications Prior to Admission  Medication Sig Dispense Refill Last  Dose   amLODipine (NORVASC) 5 MG tablet Take 5 mg by mouth daily.   11/12/2022 at 0800   atorvastatin (LIPITOR) 40 MG tablet Take 40 mg by mouth daily.   11/12/2022 at 0800   clopidogrel (PLAVIX) 75 MG tablet Take 1 tablet (75 mg total) by mouth daily. 30 tablet 0 11/12/2022 at 0800   losartan (COZAAR) 25 MG tablet Take 25 mg by mouth daily.   11/12/2022 at 0800   methadone (DOLOPHINE) 1 mg/ml oral solution Take 47 mg by mouth daily.   11/12/2022 at 0800   furosemide (LASIX) 20 MG tablet Take by mouth. (Patient not taking: Reported on 11/12/2022)   Not Taking   ipratropium-albuterol (DUONEB) 0.5-2.5 (3) MG/3ML SOLN Take by nebulization. (Patient not taking: Reported on 11/12/2022)   Not Taking      Current Facility-Administered Medications:    acetaminophen (TYLENOL) tablet 650 mg, 650 mg, Oral, Q6H PRN, 650 mg at 11/16/22 2142 **OR** acetaminophen (TYLENOL) suppository 650 mg, 650 mg, Rectal, Q6H PRN, Andris Baumann, MD   amLODipine (NORVASC) tablet 5 mg, 5 mg, Oral, Daily, Lindajo Royal V, MD, 5 mg at 11/16/22 0957   atorvastatin (LIPITOR) tablet 40 mg, 40 mg, Oral, Daily, Lindajo Royal V, MD, 40 mg at 11/16/22 0957   cefTRIAXone (ROCEPHIN) 1 g in sodium chloride 0.9 % 100 mL IVPB, 1 g, Intravenous, Q24H, Andris Baumann, MD, Stopped at 11/16/22 2218   clopidogrel (PLAVIX) tablet 75 mg, 75 mg, Oral, Daily, Lindajo Royal V, MD, 75 mg at 11/16/22 0957   doxycycline (VIBRA-TABS) tablet 100 mg, 100 mg, Oral, Q12H, Jaynie Bream, RPH, 100 mg at 11/16/22 2142   enoxaparin (LOVENOX) injection 40 mg, 40 mg, Subcutaneous, Q24H, Lindajo Royal V, MD, 40 mg at 11/16/22 2142   hydrALAZINE (APRESOLINE) injection 5 mg, 5 mg, Intravenous, Q6H PRN, Andris Baumann, MD   ipratropium-albuterol (DUONEB) 0.5-2.5 (3) MG/3ML nebulizer solution 3 mL, 3 mL, Nebulization, Q4H PRN, Andris Baumann, MD   losartan (COZAAR) tablet 25 mg, 25 mg, Oral, Daily, Lindajo Royal V, MD, 25 mg at 11/16/22 0957   methadone (DOLOPHINE) 10  MG/ML solution 47 mg, 47 mg, Oral, Daily, Lindajo Royal V, MD, 47 mg at 11/16/22 0957   ondansetron (ZOFRAN) tablet 4 mg, 4 mg, Oral, Q6H PRN **OR** ondansetron (ZOFRAN) injection 4 mg, 4 mg, Intravenous, Q6H PRN, Andris Baumann, MD  Vitals   Vitals:   11/16/22 0956 11/16/22 1520 11/16/22 1927 11/17/22 0344  BP: (!) 149/73 120/81 139/83 (!) 140/80  Pulse: 85 80 72 72  Resp: 18 18 18 18   Temp:  98.3 F (36.8 C) 98.2 F (36.8 C) (!) 97.4 F (36.3 C)  TempSrc:  Oral Oral  SpO2: 94% 90% 94% 95%     There is no height or weight on file to calculate BMI.  Physical Exam   Physical Exam Gen: A&O x4, NAD HEENT: Atraumatic, normocephalic;mucous membranes moist; oropharynx clear, tongue without atrophy or fasciculations. Neck: Supple, trachea midline. Resp: CTAB, no w/r/r CV: RRR, no m/g/r; nml S1 and S2. 2+ symmetric peripheral pulses. Abd: soft/NT/ND; nabs x 4 quad Extrem: Nml bulk; no cyanosis, clubbing, or edema.  Neuro: *MS: A&O x4. Follows multi-step commands.  *Speech: fluid, nondysarthric, able to name and repeat but has mild word finding difficulties and substitutions in conversation *CN:    I: Deferred   II,III: PERRLA, VFF by confrontation, optic discs unable to be visualized 2/2 pupillary constriction   III,IV,VI: EOMI w/o nystagmus, no ptosis   V: Sensation intact from V1 to V3 to LT   VII: Eyelid closure was full.  Smile symmetric.   VIII: Hearing intact to voice   IX,X: Voice normal, palate elevates symmetrically    XI: SCM/trap 5/5 bilat   XII: Tongue protrudes midline, no atrophy or fasciculations   *Motor:   Normal bulk.  No tremor, rigidity or bradykinesia. No pronator drift.    Strength: Dlt Bic Tri WrE WrF FgS Gr HF KnF KnE PlF DoF    Left 5 5 5 5 5 5 5 5 5 5 5 5     Right 5 5 5 5 5 5 5 5 5 5 5 5     *Sensory: Intact to light touch, pinprick, temperature vibration throughout. Symmetric. Propioception intact bilat.  No double-simultaneous extinction.   *Coordination:  Finger-to-nose, heel-to-shin, rapid alternating motions were intact. *Reflexes:  2+ and symmetric throughout without clonus; toes down-going bilat *Gait: deferred  NIHSS = 1   Premorbid mRS = 1   Labs   CBC:  Recent Labs  Lab 11/16/22 0327 11/17/22 0519  WBC 10.1 5.8  NEUTROABS 8.4* 4.0  HGB 14.3 12.4  HCT 44.0 37.9  MCV 92.6 94.3  PLT 212 210    Basic Metabolic Panel:  Lab Results  Component Value Date   NA 138 11/17/2022   K 2.9 (L) 11/17/2022   CO2 28 11/17/2022   GLUCOSE 146 (H) 11/17/2022   BUN 15 11/17/2022   CREATININE 1.06 (H) 11/17/2022   CALCIUM 8.7 (L) 11/17/2022   GFRNONAA 58 (L) 11/17/2022   GFRAA >60 07/25/2017   Lipid Panel:  Lab Results  Component Value Date   LDLCALC 60 09/24/2021   HgbA1c:  Lab Results  Component Value Date   HGBA1C 5.6 10/10/2020   Urine Drug Screen:     Component Value Date/Time   LABOPIA NONE DETECTED 11/12/2022 2130   COCAINSCRNUR NONE DETECTED 11/12/2022 2130   LABBENZ NONE DETECTED 11/12/2022 2130   AMPHETMU NONE DETECTED 11/12/2022 2130   THCU NONE DETECTED 11/12/2022 2130   LABBARB NONE DETECTED 11/12/2022 2130    Alcohol Level     Component Value Date/Time   ETH <10 09/28/2020 1347    No MRI this admission  Impression   This is a 67 yo woman with hx COPD, chronic pain, 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 During her hospitalization she had had fluctuating confusion during which times her word-finding difficulty (chronic since her prior stroke) seems worse than others. EEG showed only mild diffuse slowing. I suspect these fluctuating episodes are 2/2 delirium perhaps superimposed on some baseline cognitive impairment but will order MRI brain to rule out  new infarct.  Recommendations   - MRI brain wo contrast - If this is neg for acute infarct, no further neuro workup indicated this admission  and patient may f/u with her established outpatient neurologist  Neurology will f/u MRI brain ______________________________________________________________________   Thank you for the opportunity to take part in the care of this patient. If you have any further questions, please contact the neurology consultation attending.  Signed,  Bing Neighbors, MD [facility] [phone]  If 7pm- 7am, please page neurology on call as listed in AMION.  **Any copied and pasted documentation in this note was written by me in another application not billed for and pasted by me into this document.

## 2022-11-17 NOTE — TOC Progression Note (Addendum)
Transition of Care [facility]) - Progression Note    Patient Details  Name: Natalie [name] MRN: [id number] Date of Birth: [birth year]  Transition of Care [facility]) CM/SW Contact  Margarito Liner, LCSW Phone Number: 11/17/2022, 11:45 AM  Clinical Narrative:   PT worked with patient again today and are still recommending home health. Called son, [name], to notify. Discussed adding a Child psychotherapist to home health services in case she needs placement from home. CSW left message for [facility] liaison with this request. Patient is sometimes home alone. CSW will send patient home with brochures for Life Alert devices so family can look into this.  1:24 pm: Frances Furbish can add a Child psychotherapist and asked this CSW to complete FL2.  Expected Discharge Plan: Home w Home Health Services Barriers to Discharge: Barriers Resolved  Expected Discharge Plan and Services     Post Acute Care Choice: Home Health Living arrangements for the past 2 months: Single Family Home                                       Social Determinants of Health (SDOH) Interventions SDOH Screenings   Food Insecurity: No Food Insecurity (11/13/2022)  Housing: Low Risk  (11/13/2022)  Transportation Needs: No Transportation Needs (11/13/2022)  Utilities: Not At Risk (11/13/2022)  Tobacco Use: Medium Risk (11/13/2022)    Readmission Risk Interventions     No data to display

## 2022-11-17 NOTE — Progress Notes (Addendum)
Progress Note   [name] XWR:[id number] DOB: Feb 10, 1956 DOA: 11/12/2022     5 DOS: the patient was seen and examined on 11/17/2022       Subjective:  Patient seen and examined at bedside this morning Patient still having underlying confusion She has refused to undergo MRI until after she discusses this with the husband I called patient's son today Natalie [name] and updated him.  He is going to speak with his dad to make sure they talk to the patient for her to agree to the MRI. I have requested for neurologist to evaluate patient.    Brief hospital course:   From HPI "Natalie [name] is a 67 y.o. 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  She was also noted to be confused . most of the history is given by son and ex-husband at bedside.  They report that since her stroke she has had occasional problems with word finding and getting the right words out, however over the past day it has become more pronounced and she has been saying things that did not make sense ED course and data review: On arrival BP 180/105 with pulse 77, afebrile, O2 sat 82% on room air improving to 96% on 3 L.  Labs notable for WBC 18,000, lactic acid pending.  VBG with pH 7.45 and pCO2 of 50.  CMP unremarkable.  Troponin 11.  .  EKG, personally viewed and interpreted showing sinus rhythm at 81 with nonspecific ST-T wave changes. Imaging: Chest x-ray with hazy in space and interstitial opacities in the left mid and lower lobe favor pneumonia and/or aspiration CT head with chronic atrophic and ischemic changes without acute abnormality. Patient placed on ceftriaxone and doxycycline due to penicillin And azithromycin  allergy, given a fluid bolus. Hospitalist consulted for admission. "       Assessment and Plan:  CAP (community acquired pneumonia) Suspect aspiration pneumonia Acute on chronic respiratory failure with hypoxia Continue Rocephin and doxycycline (penicillin and azithromycin allergy) Supplemental oxygen Incentive spirometry Supplemental oxygen to keep sats over 94 Aspiration precautions Diet has been advanced     Acute metabolic encephalopathy-improved Likely multifactorial and related to pneumonia, chronic methadone use.   Questionable seizure history so could be postictal CT head non acute -Neurologic checks - Diet has been advanced -Fall seizure and aspiration precautions -EEG did not show any epileptiform waves - I discussed the case with neurologist on-call Dr. Beatrix Fetters Neurologist is still waiting on MRI to guide therapy   Hypertensive emergency Continue home losartan and amlodipine Hydralazine IV as needed for SBP over 170   Hypokalemia  patient with potassium of 2.9 on 11/17/2022 We will continue repletion and monitoring  Methadone maintenance therapy patient (HCC) Continue methadone therapy   History of CVA (cerebrovascular accident) CT head with no acute findings Acute CVA not suspected at this time Continue clopidogrel and atorvastatin   Chronic pain syndrome Methadone resumed   COPD (chronic obstructive pulmonary disease) (HCC) Acute on chronic hypoxic respiratory failure Patient not wheezing Followed by pulmonology for stage II to lll copd.  On oxygen at 3 liters at baseline but only uses as needed  -Continue duo neb tid  and as needed, pulmicort inhaler - Continue O2 at 3 L to keep sats over 92%.  O2 sat was 82% on room air on presentation     DVT prophylaxis: Lovenox   Consults: Dr Selina Cooley. neurology   Advance Care Planning:   Code Status: Prior      Disposition Plan: Back to previous home environment     Physical Exam:   Vitals and nursing  note reviewed.  Constitutional:      General: She is not in acute distress. HENT:     Head: Normocephalic and atraumatic.  Cardiovascular:     Rate and Rhythm: Normal rate and regular rhythm.     Heart sounds: Normal heart sounds.  Pulmonary:     Effort: Pulmonary effort is normal.     Breath sounds: Normal breath sounds.  Abdominal:     Palpations: Abdomen is soft.     Tenderness: There is no abdominal tenderness.  Neurological:     Mental Status: Alert and oriented in person, place and time       Data Reviewed: I have reviewed patient's lab results today showing sodium 138 potassium 2.9 creatinine 1.0  Family Communication: Discussed with patient's son Natalie [name] over the phone   Disposition: Status is: Inpatient Continues to be appropriate for inpatient management given altered mental status requiring antibiotic therapy for management of pneumonia   Time spent: 50 minutes taking care of this patient, discussing with consultants as well as patient's family      Vitals:   11/16/22 1520 11/16/22 1927 11/17/22 0344 11/17/22 0831  BP: 120/81 139/83 (!) 140/80 119/73  Pulse: 80 72 72 81  Resp: 18 18 18 16   Temp: 98.3 F (36.8 C) 98.2 F (36.8 C) (!) 97.4 F (36.3 C) 98.5 F (36.9 C)  TempSrc: Oral Oral  Oral  SpO2: 90% 94% 95% 97%     Author: Loyce Dys, MD 11/17/2022 3:56 PM  For on call review [email or web address].

## 2022-11-17 NOTE — [place] FL2 (Signed)
[place] MEDICAID FL2 LEVEL OF CARE FORM     IDENTIFICATION  Patient Name: Natalie [name] Birthdate: Jan 02, 1956 Sex: female Admission Date (Current Location): 11/12/2022  [facility] and [place] Number:  Chiropodist and Address:  [facility], [address] [place], [place] [address]      Provider Number: [id number]  Attending Physician Name and Address:  Loyce Dys, MD  Relative Name and Phone Number:       Current Level of Care: Hospital Recommended Level of Care: Skilled Nursing Facility Prior Approval Number:    Date Approved/Denied:   PASRR Number: [id number]  Discharge Plan: SNF    Current Diagnoses: Patient Active Problem List   Diagnosis Date Noted   CAP (community acquired pneumonia) 11/12/2022   History of CVA (cerebrovascular accident) 11/12/2022   Methadone maintenance therapy patient (HCC) 11/12/2022   Acute metabolic encephalopathy 11/12/2022   Hypertensive emergency 11/12/2022   Swelling of limb 11/26/2021   Frontal lobe and executive function deficit    Hypoalbuminemia due to protein-calorie malnutrition (HCC)    Chronic pain syndrome    Dysphagia, post-stroke    Acute ischemic left MCA stroke (HCC) 10/11/2020   Aphasia due to acute cerebrovascular accident (CVA) (HCC) 10/11/2020   Cerebral thrombosis with cerebral infarction 10/10/2020   Malnutrition of moderate degree 10/01/2020   Pressure injury of skin 09/30/2020   Encephalopathy acute 09/30/2020   Altered mental status    Acute respiratory failure with hypoxia (HCC)    Encephalopathy 09/28/2020   Status epilepticus (HCC)    NSTEMI (non-ST elevated myocardial infarction) (HCC) 07/24/2017   Cataracts, bilateral 03/30/2017   COPD (chronic obstructive pulmonary disease) (HCC) 03/30/2017   Depression 03/30/2017   Drug addiction (HCC) 03/30/2017   Seizures (HCC) 03/30/2017   Chronic migraine without aura without status migrainosus, not intractable 07/26/2014    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   CKD (chronic kidney disease) stage 3, GFR 30-59 ml/min (HCC) 08/04/2013   Edema 03/24/2011   Tobacco use disorder 03/24/2011   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   Migraine headache 01/10/2011    Orientation RESPIRATION BLADDER Height & Weight     Self, Time, Place  O2 (Nasal Cannula 3 L) Continent Weight:   Height:     BEHAVIORAL SYMPTOMS/MOOD NEUROLOGICAL BOWEL NUTRITION STATUS   (None)  (History of seizures) Continent Diet  AMBULATORY STATUS COMMUNICATION OF NEEDS Skin   Supervision Verbally Bruising                       Personal Care Assistance Level of Assistance  Bathing, Feeding, Dressing Bathing Assistance: Limited assistance Feeding assistance: Limited assistance Dressing Assistance: Limited assistance     Functional Limitations Info  Sight, Hearing, Speech Sight Info: Adequate Hearing Info: Adequate Speech Info: Adequate    SPECIAL CARE FACTORS FREQUENCY  PT (By licensed PT), OT (By licensed OT)     PT Frequency: 5 x week OT Frequency: 5 x week            Contractures Contractures Info: Not present    Additional Factors Info  Code Status, Allergies, Psychotropic Code Status Info: Full code Allergies Info: Sulfasalazine, Aspirin, Darvon (Propoxyphene), Iodine, Penicillins, Sulfa Antibiotics, Zithromax (Azithromycin) Psychotropic Info: Depression         Current Medications (11/17/2022):  This is the current hospital active medication list Current Facility-Administered Medications  Medication Dose Route Frequency Provider Last Rate Last Admin   acetaminophen (TYLENOL) tablet 650  mg  650 mg Oral Q6H PRN Andris Baumann, MD   650 mg at 11/16/22 2142   Or   acetaminophen (TYLENOL) suppository 650 mg  650 mg Rectal Q6H PRN Andris Baumann, MD       amLODipine (NORVASC) tablet 5 mg  5 mg Oral Daily Lindajo Royal V, MD   5 mg at 11/17/22 1610   atorvastatin (LIPITOR) tablet 40 mg   40 mg Oral Daily Lindajo Royal V, MD   40 mg at 11/17/22 9604   cefTRIAXone (ROCEPHIN) 1 g in sodium chloride 0.9 % 100 mL IVPB  1 g Intravenous Q24H Loyce Dys, MD   Stopped at 11/16/22 2218   clopidogrel (PLAVIX) tablet 75 mg  75 mg Oral Daily Andris Baumann, MD   75 mg at 11/17/22 5409   doxycycline (VIBRA-TABS) tablet 100 mg  100 mg Oral Q12H Rosezetta Schlatter T, MD   100 mg at 11/17/22 0832   enoxaparin (LOVENOX) injection 40 mg  40 mg Subcutaneous Q24H Lindajo Royal V, MD   40 mg at 11/16/22 2142   hydrALAZINE (APRESOLINE) injection 5 mg  5 mg Intravenous Q6H PRN Andris Baumann, MD       ipratropium-albuterol (DUONEB) 0.5-2.5 (3) MG/3ML nebulizer solution 3 mL  3 mL Nebulization Q4H PRN Andris Baumann, MD       losartan (COZAAR) tablet 25 mg  25 mg Oral Daily Lindajo Royal V, MD   25 mg at 11/17/22 8119   methadone (DOLOPHINE) 10 MG/ML solution 47 mg  47 mg Oral Daily Lindajo Royal V, MD   47 mg at 11/17/22 0833   ondansetron (ZOFRAN) tablet 4 mg  4 mg Oral Q6H PRN Andris Baumann, MD       Or   ondansetron [facility]) injection 4 mg  4 mg Intravenous Q6H PRN Andris Baumann, MD       potassium chloride SA (KLOR-CON M) CR tablet 40 mEq  40 mEq Oral Q4H Loyce Dys, MD   40 mEq at 11/17/22 1478     Discharge Medications: Please see discharge summary for a list of discharge medications.  Relevant Imaging Results:  Relevant Lab Results:   Additional Information SS#: [id number]  Margarito Liner, LCSW

## 2022-11-17 NOTE — Progress Notes (Signed)
Physical Therapy Treatment Patient Details Name: Natalie [name] MRN: [id number] DOB: October 16, 1955 Today's Date: 11/17/2022   History of Present Illness Pt is a 67 y.o. 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    PT Comments    Pt alert, oriented to self and place, challenged by time but unclear if confusion or aphasia limited pts ability. She was able to transfer without RW, and ambulate ~241ft. RW provided more stability but pt also able to ambulate without without LOB. spO2 88% on 3L, with a seated rest increased to 92%. She was also able to answer some safety questions as well as describe her home. The patient would benefit from further skilled PT intervention to continue to progress towards goals.    Recommendations for follow up therapy are one component of a multi-disciplinary discharge planning process, led by the attending physician.  Recommendations may be updated based on patient status, additional functional criteria and insurance authorization.  Follow Up Recommendations       Assistance Recommended at Discharge Intermittent Supervision/Assistance  Patient can return home with the following Direct supervision/assist for medications management;Assistance with cooking/housework;Direct supervision/assist for financial management   Equipment Recommendations  None recommended by PT    Recommendations for Other Services       Precautions / Restrictions Precautions Precautions: Fall Precaution Comments: SpO2 >/= 92-94% Restrictions Weight Bearing Restrictions: No     Mobility  Bed Mobility Overal bed mobility: Modified Independent                  Transfers Overall  transfer level: Needs assistance   Transfers: Sit to/from Stand Sit to Stand: Supervision                Ambulation/Gait Ambulation/Gait assistance: Supervision Gait Distance (Feet): 200 Feet Assistive device: Rolling walker (2 wheels), None         General Gait Details: RW utilized for energy conservation. Demonstrates slowed cadence with decreased step length/foot clearance bil. Desat to 84% after 89ft on 4L. Able to recover within a minute to 91%, seated with cues for pursed lip breathing.   Stairs             Wheelchair Mobility    Modified Rankin (Stroke Patients Only)       Balance Overall balance assessment: Needs assistance Sitting-balance support: Feet supported Sitting balance-Leahy Scale: Normal     Standing balance support: Bilateral upper extremity supported Standing balance-Leahy Scale: Good Standing balance comment: standing balance in RW                            Cognition Arousal/Alertness: Awake/alert Behavior During Therapy: WFL for tasks assessed/performed Overall Cognitive Status: Within Functional Limits for tasks assessed                                 General Comments: A&O x3; max cues and choices for time. Unable to state year with choices        Exercises      General Comments        Pertinent Vitals/Pain  Pain Assessment Pain Assessment: No/denies pain    Home Living                          Prior Function            PT Goals (current goals can now be found in the care plan section) Progress towards PT goals: Progressing toward goals    Frequency    Min 2X/week      PT Plan Current plan remains appropriate    Co-evaluation              AM-PAC PT "6 Clicks" Mobility   Outcome Measure  Help needed turning from your back to your side while in a flat bed without using bedrails?: None Help needed moving from lying on your back to sitting on the side of a flat  bed without using bedrails?: None Help needed moving to and from a bed to a chair (including a wheelchair)?: None Help needed standing up from a chair using your arms (e.g., wheelchair or bedside chair)?: None Help needed to walk in hospital room?: None Help needed climbing 3-5 steps with a railing? : A Little 6 Click Score: 23    End of Session Equipment Utilized During Treatment: Gait belt;Oxygen Activity Tolerance: Patient tolerated treatment well Patient left: in bed;with nursing/sitter in room Nurse Communication: Mobility status PT Visit Diagnosis: Other abnormalities of gait and mobility (R26.89)     Time: 1038-1050 PT Time Calculation (min) (ACUTE ONLY): 12 min  Charges:  $Therapeutic Activity: 8-22 mins                     Olga Coaster PT, DPT 11:37 AM,11/17/22

## 2022-11-18 DIAGNOSIS — G9341 Metabolic encephalopathy: Secondary | ICD-10-CM | POA: Diagnosis not present

## 2022-11-18 LAB — BASIC METABOLIC PANEL
Anion gap: 8 (ref 5–15)
BUN: 15 mg/dL (ref 8–23)
CO2: 29 mmol/L (ref 22–32)
Calcium: 9.4 mg/dL (ref 8.9–10.3)
Chloride: 102 mmol/L (ref 98–111)
Creatinine, Ser: 0.94 mg/dL (ref 0.44–1.00)
GFR, Estimated: >60 mL/min (ref >60)
Glucose, Bld: 92 mg/dL (ref 70–99)
Potassium: 4.2 mmol/L (ref 3.5–5.1)
Sodium: 139 mmol/L (ref 135–145)

## 2022-11-18 LAB — CBC WITH DIFFERENTIAL/PLATELET
Abs Immature Granulocytes: 0.04 10*3/uL (ref 0.00–0.07)
Basophils Absolute: 0 10*3/uL (ref 0.0–0.1)
Basophils Relative: 1 %
Eosinophils Absolute: 0 10*3/uL (ref 0.0–0.5)
Eosinophils Relative: 0 %
HCT: 47.3 % — ABNORMAL HIGH (ref 36.0–46.0)
Hemoglobin: 15 g/dL (ref 12.0–15.0)
Immature Granulocytes: 1 %
Lymphocytes Relative: 27 %
Lymphs Abs: 2 10*3/uL (ref 0.7–4.0)
MCH: 30.3 pg (ref 26.0–34.0)
MCHC: 31.7 g/dL (ref 30.0–36.0)
MCV: 95.6 fL (ref 80.0–100.0)
Monocytes Absolute: 0.8 10*3/uL (ref 0.1–1.0)
Monocytes Relative: 11 %
Neutro Abs: 4.5 10*3/uL (ref 1.7–7.7)
Neutrophils Relative %: 60 %
Platelets: 283 10*3/uL (ref 150–400)
RBC: 4.95 MIL/uL (ref 3.87–5.11)
RDW: 14 % (ref 11.5–15.5)
WBC: 7.3 10*3/uL (ref 4.0–10.5)
nRBC: 0 % (ref 0.0–0.2)

## 2022-11-18 NOTE — Plan of Care (Signed)
MRI negative. No new recs. Please see recs from last neurology consult from Dr. Selina Cooley. Please call with questions.   -- Milon Dikes, MD Neurologist [facility] Pager: [phone]

## 2022-11-18 NOTE — TOC Transition Note (Signed)
Transition of Care [facility]) - CM/SW Discharge Note   Patient Details  Name: Natalie [name] MRN: [id number] Date of Birth: July 04, 1956  Transition of Care [facility]) CM/SW Contact:  Darleene Cleaver, LCSW Phone Number: 11/18/2022, 1:48 PM   Clinical Narrative:     Patient will be going home with home health PT, OT, and RN, through [facility].  TOC signing off please reconsult with any other TOC needs, home health agency has been notified of planned discharge.      Final next level of care: Home w Home Health Services Barriers to Discharge: Barriers Resolved   Patient Goals and CMS Choice CMS Medicare.gov Compare Post Acute Care list provided to:: Patient Choice offered to / list presented to : Patient  Discharge Placement                         Discharge Plan and Services Additional resources added to the After Visit Summary for       Post Acute Care Choice: Home Health                    HH Arranged: PT, OT, RN [facility] Agency: [facility] Date [facility] Agency Contacted: 11/17/22 Time HH Agency Contacted: 1348 Representative spoke with at [facility] Agency: Kandee Keen  Social Determinants of Health (SDOH) Interventions SDOH Screenings   Food Insecurity: No Food Insecurity (11/13/2022)  Housing: Low Risk  (11/13/2022)  Transportation Needs: No Transportation Needs (11/13/2022)  Utilities: Not At Risk (11/13/2022)  Tobacco Use: Medium Risk (11/13/2022)     Readmission Risk Interventions     No data to display

## 2022-11-18 NOTE — Discharge Summary (Signed)
Physician Discharge Summary   [name] MRN: [id number] DOB: [birth year]  Admit date:     11/12/2022  Discharge date: 11/18/22  Discharge Physician: Loyce Dys   PCP: Rayetta Humphrey, MD    Discharge Diagnoses: CAP (community acquired pneumonia) Suspect aspiration pneumonia Acute on chronic respiratory failure with hypoxia Acute metabolic encephalopathy-improved Likely multifactorial and related to pneumonia, chronic methadone use.    Hypertensive emergency Hypokalemia  Methadone maintenance therapy patient (HCC) History of CVA (cerebrovascular accident) Chronic pain syndrome COPD (chronic obstructive pulmonary disease) (HCC) Acute on chronic hypoxic respiratory failure     Hospital Course: Natalie [name] is a 67 y.o. 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  She was also noted to be confused . Most of the history is given by son and ex-husband at bedside.  They report that since her stroke she has had occasional problems with word finding and getting the right words out, however over the past day it has become more pronounced and she has been saying things that did not make sense ED course and data review: On arrival BP 180/105 with pulse 77, afebrile, O2 sat 82% on room air improving to 96% on 3 L.  Labs notable for WBC 18,000, lactic acid pending.  VBG with pH 7.45 and pCO2 of 50.  CMP unremarkable.  Troponin 11.  .  EKG, personally viewed and interpreted showing sinus rhythm at 81 with nonspecific ST-T wave changes. Imaging: Chest x-ray with hazy in space and interstitial opacities in the left mid and lower lobe favor pneumonia and/or aspiration CT head with chronic  atrophic and ischemic changes without acute abnormality. Patient placed on ceftriaxone and doxycycline due to penicillin completed the course.  Patient's intermittent confusion as well as word finding difficulty made me to consult with neurology who evaluated patient and requested an EEG which only showed mild diffuse slowing.  Patient also underwent MRI of the brain that did not show acute stroke.  I have discussed the case with the neurologist and at this point they do not recommend additional workup.  According to neurologist patient may have underlying cognitive abnormality from the previous stroke and will have to continue to have outpatient follow-up.  Patient is otherwise doing fine and able to ambulate significant amount of feet and has been evaluated by PT OT who have recommended home health therapy.  I have discussed this with patient's family and they have agreed to follow-up with the outpatient neurologist as well as primary care physician.    Consultants: Neurology Procedures performed: None Disposition: Home health Diet recommendation:  Discharge Diet Orders (From admission, onward)     Start     Ordered   11/18/22 0000  Diet - low sodium heart healthy        11/18/22 1306           Cardiac diet DISCHARGE MEDICATION: Allergies as of 11/18/2022       Reactions   Sulfasalazine Shortness Of Breath   Aspirin    Darvon [propoxyphene]    Iodine    Penicillins    Sulfa Antibiotics    Zithromax [azithromycin]  Medication List     STOP taking these medications    furosemide 20 MG tablet Commonly known as: LASIX       TAKE these medications    amLODipine 5 MG tablet Commonly known as: NORVASC Take 5 mg by mouth daily.   atorvastatin 40 MG tablet Commonly known as: LIPITOR Take 40 mg by mouth daily.   clopidogrel 75 MG tablet Commonly known as: PLAVIX Take 1 tablet (75 mg total) by mouth daily.   ipratropium-albuterol 0.5-2.5 (3) MG/3ML  Soln Commonly known as: DUONEB Take by nebulization.   losartan 25 MG tablet Commonly known as: COZAAR Take 25 mg by mouth daily.   methadone 1 mg/ml  oral solution Commonly known as: DOLOPHINE Take 47 mg by mouth daily.        Discharge Exam: Vitals and nursing note reviewed.  Constitutional:      General: She is not in acute distress. HENT:     Head: Normocephalic and atraumatic.  Cardiovascular:     Rate and Rhythm: Normal rate and regular rhythm.     Heart sounds: Normal heart sounds.  Pulmonary:     Effort: Pulmonary effort is normal.     Breath sounds: Normal breath sounds.  Abdominal:     Palpations: Abdomen is soft.     Tenderness: There is no abdominal tenderness.  Neurological:     Mental Status: Alert and oriented in person, place but not time    Condition at discharge: good  The results of significant diagnostics from this hospitalization (including imaging, microbiology, ancillary and laboratory) are listed below for reference.   Imaging Studies: MR BRAIN WO CONTRAST  Result Date: 11/17/2022 CLINICAL DATA:  Mental status change, unknown cause. EXAM: MRI HEAD WITHOUT CONTRAST TECHNIQUE: Multiplanar, multiecho pulse sequences of the brain and surrounding structures were obtained without intravenous contrast. COMPARISON:  Head CT 11/12/2022.  MRI brain 10/11/2021. FINDINGS: Brain: No acute infarct or hemorrhage. 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 No hydrocephalus or extra-axial collection. No mass effect or midline shift. No foci of abnormal susceptibility. Vascular: Normal flow voids. Skull and upper cervical spine: Normal marrow signal. Sinuses/Orbits: Unremarkable. Other: None. IMPRESSION: 1. No acute intracranial process. 2. 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 Electronically Signed   By: Orvan Falconer M.D.    On: 11/17/2022 20:41   EEG adult  Result Date: 11/13/2022 Jefferson Fuel, MD     11/13/2022  2:42 PM Routine EEG Report Natalie [name] is a 67 y.o. female with a history of seizure who is undergoing an EEG to evaluate for seizures. Report: This EEG was acquired with electrodes placed according to the International 10-20 electrode system (including Fp1, Fp2, F3, F4, C3, C4, P3, P4, O1, O2, T3, T4, T5, T6, A1, A2, Fz, Cz, Pz). The following electrodes were missing or displaced: none. The occipital dominant rhythm was 5-6 Hz. This activity is reactive to stimulation. Drowsiness was manifested by background fragmentation; deeper stages of sleep were not identified. There was no focal slowing. There were no interictal epileptiform discharges. There were no electrographic seizures identified. Photic stimulation and hyperventilation were not performed. Impression and clinical correlation: This EEG was obtained while awake and drowsy and is abnormal due to moderate diffuse slowing indicative of global cerebral dysfunction. Epileptiform abnormalities were not seen during this recording. Bing Neighbors, MD [facility] [phone] If 7pm- 7am, please page  neurology on call as listed in AMION.   CT HEAD WO CONTRAST ( )  Result Date: 11/12/2022 CLINICAL DATA:  Altered mental status EXAM: CT HEAD WITHOUT CONTRAST TECHNIQUE: Contiguous axial images were obtained from the base of the skull through the vertex without intravenous contrast. RADIATION DOSE REDUCTION: This exam was performed according to the departmental dose-optimization program which includes automated exposure control, adjustment of the mA and/or kV according to patient size and/or use of iterative reconstruction technique. COMPARISON:  09/30/2020, 10/09/2020 FINDINGS: Brain: No evidence of acute infarction, hemorrhage, hydrocephalus, extra-axial collection or mass lesion/mass effect. Chronic atrophic and ischemic changes are noted. Interval stable  left MCA infarct is noted when compared with the prior exam. Vascular: No hyperdense vessel or unexpected calcification. Skull: Normal. Negative for fracture or focal lesion. Sinuses/Orbits: No acute finding. Other: None. IMPRESSION: Chronic atrophic and ischemic changes without acute abnormality. Electronically Signed   By: Alcide Clever M.D.   On: 11/12/2022 21:03   DG Chest 2 View  Result Date: 11/12/2022 CLINICAL DATA:  Shortness of breath and weakness EXAM: CHEST - 2 VIEW COMPARISON:  Chest radiographs 10/14/2022 FINDINGS: Stable cardiomediastinal silhouette. Aortic atherosclerotic calcification. Hyperinflation and chronic bronchitic change. Hazy airspace and interstitial opacities in the left lower lobe are new since 10/14/2022 and favored infectious/inflammatory. Question small left pleural effusion. No right pleural effusion. No pneumothorax. No displaced rib fractures. IMPRESSION: Hazy airspace and interstitial opacities in the left mid and lower lobe favor pneumonia and/or aspiration. Electronically Signed   By: Minerva Fester M.D.   On: 11/12/2022 19:52   VAS [place] ABI WITH/WO TBI  Result Date: 11/10/2022  LOWER EXTREMITY DOPPLER STUDY Patient Name:  Natalie [name]  Date of Exam:   11/06/2022 Medical Rec #: [id number]    Accession #:    [id number] Date of Birth: [birth year]    Patient Gender: F Patient Age:   69 years Exam Location:  [facility] Procedure:      VAS [place] ABI WITH/WO TBI Referring Phys: Festus Barren --------------------------------------------------------------------------------  Indications: Peripheral artery disease.  Vascular Interventions: 01/01/2022: Catheter placement into Aorta from Bilateral                         femoral approach. Aortogram and Selective Right Lower                         Extremity Angiogram. PTA of the Aorta and the Right CIA                         with 6 mm diameter Lutonix drug coated balloon to                         predilate the lesions. Stent  placement to the Right CIA                         with 8 mm diameter by 37 mm length Lifestream stent.                         Stent placement to the Aorta with 12 mm diameter by 38                         mm length lifestream stent postdialated with a 14 mm  balloon. Comparison Study: 05/08/2022 Performing Technologist: Debbe Bales RVS  Examination Guidelines: A complete evaluation includes at minimum, Doppler waveform signals and systolic blood pressure reading at the level of bilateral brachial, anterior tibial, and posterior tibial arteries, when vessel segments are accessible. Bilateral testing is considered an integral part of a complete examination. Photoelectric Plethysmograph (PPG) waveforms and toe systolic pressure readings are included as required and additional duplex testing as needed. Limited examinations for reoccurring indications may be performed as noted.  ABI Findings: +---------+------------------+-----+---------+--------+ Right    Rt Pressure (mmHg)IndexWaveform Comment  +---------+------------------+-----+---------+--------+ Brachial 180                                      +---------+------------------+-----+---------+--------+ ATA      190               1.02 triphasic         +---------+------------------+-----+---------+--------+ PTA      199               1.07 triphasic         +---------+------------------+-----+---------+--------+ Great Toe157               0.84 Normal            +---------+------------------+-----+---------+--------+ +---------+------------------+-----+---------+-------+ Left     Lt Pressure (mmHg)IndexWaveform Comment +---------+------------------+-----+---------+-------+ Brachial 186                                     +---------+------------------+-----+---------+-------+ ATA      189               1.02 triphasic        +---------+------------------+-----+---------+-------+ PTA      196                1.05 triphasic        +---------+------------------+-----+---------+-------+ Great Toe190               1.02 Normal           +---------+------------------+-----+---------+-------+ +-------+-----------+-----------+------------+------------+ ABI/TBIToday's ABIToday's TBIPrevious ABIPrevious TBI +-------+-----------+-----------+------------+------------+ Right  1.07       .84        1.13        .77          +-------+-----------+-----------+------------+------------+ Left   1.05       1.02       1.07        .75          +-------+-----------+-----------+------------+------------+ Bilateral ABIs appear essentially unchanged compared to prior study on 05/08/2022. Bilateral TBIs appear increased compared to prior study on 05/08/2022.  Summary: Right: Resting right ankle-brachial index is within normal range. The right toe-brachial index is normal. Left: Resting left ankle-brachial index is within normal range. The left toe-brachial index is normal. *See table(s) above for measurements and observations.  Electronically signed by Festus Barren MD on 11/10/2022 at 9:23:08 AM.    Final     Microbiology: Results for orders placed or performed during the hospital encounter of 10/14/22  Resp panel by RT-PCR (RSV, Flu A&B, Covid) Anterior Nasal Swab     Status: None   Collection Time: 10/14/22  9:58 AM   Specimen: Anterior Nasal Swab  Result Value Ref Range Status   SARS Coronavirus 2 by RT PCR NEGATIVE NEGATIVE Final   Influenza A by PCR  NEGATIVE NEGATIVE Final   Influenza B by PCR NEGATIVE NEGATIVE Final    Comment: (NOTE) The Xpert Xpress SARS-CoV-2/FLU/RSV plus assay is intended as an aid in the diagnosis of influenza from Nasopharyngeal swab specimens and should not be used as a sole basis for treatment. Nasal washings and aspirates are unacceptable for Xpert Xpress SARS-CoV-2/FLU/RSV testing.  Fact Sheet for Patients: BloggerCourse.com  Fact Sheet  for Healthcare Providers: SeriousBroker.it  This test is not yet approved or cleared by the [place] FDA and has been authorized for detection and/or diagnosis of SARS-CoV-2 by FDA under an Emergency Use Authorization (EUA). This EUA will remain in effect (meaning this test can be used) for the duration of the COVID-19 declaration under Section 564(b)(1) of the Act, 21 U.S.C. section 360bbb-3(b)(1), unless the authorization is terminated or revoked.     Resp Syncytial Virus by PCR NEGATIVE NEGATIVE Final    Comment: (NOTE) Fact Sheet for Patients: BloggerCourse.com  Fact Sheet for Healthcare Providers: SeriousBroker.it  This test is not yet approved or cleared by the [place] FDA and has been authorized for detection and/or diagnosis of SARS-CoV-2 by FDA under an Emergency Use Authorization (EUA). This EUA will remain in effect (meaning this test can be used) for the duration of the COVID-19 declaration under Section 564(b)(1) of the Act, 21 U.S.C. section 360bbb-3(b)(1), unless the authorization is terminated or revoked.  Performed at [facility], [address], [place], [place] [address]     Labs: CBC: Recent Labs  Lab 11/14/22 [id number] 11/15/22 0503 11/16/22 0327 11/17/22 0519 11/18/22 0604  WBC 16.3* 10.9* 10.1 5.8 7.3  NEUTROABS 14.4* 9.2* 8.4* 4.0 4.5  HGB 14.8 15.0 14.3 12.4 15.0  HCT 44.6 46.8* 44.0 37.9 47.3*  MCV 93.3 94.9 92.6 94.3 95.6  PLT 182 169 212 210 283   Basic Metabolic Panel: Recent Labs  Lab 11/12/22 2044 11/13/22 0215 11/14/22 0442 11/15/22 0813 11/17/22 0519 11/18/22 0604  NA 137 138 140 135 138 139  K 3.5 3.3* 3.0* 3.4* 2.9* 4.2  CL 97* 98 100 100 100 102  CO2 27 25 28 28 28 29   GLUCOSE 98 112* 82 102* 146* 92  BUN 18 15 23 18 15 15   CREATININE 0.99 0.85 1.13* 1.02* 1.06* 0.94  CALCIUM 9.5 9.4 8.8* 8.7* 8.7* 9.4  MG 2.2  --   --   --   --    --    Liver Function Tests: Recent Labs  Lab 11/12/22 2044  AST 15  ALT 15  ALKPHOS 109  BILITOT 4.6*  PROT 7.8  ALBUMIN 4.3   CBG: No results for input(s): "GLUCAP" in the last 168 hours.  Discharge time spent: greater than 30 minutes.  Signed: Loyce Dys, MD [facility] 11/18/2022

## 2022-12-02 ENCOUNTER — Ambulatory Visit (INDEPENDENT_AMBULATORY_CARE_PROVIDER_SITE_OTHER): Payer: Medicare HMO | Admitting: Podiatry

## 2022-12-02 DIAGNOSIS — R600 Localized edema: Secondary | ICD-10-CM | POA: Diagnosis not present

## 2022-12-02 NOTE — Progress Notes (Signed)
Chief Complaint  Patient presents with   Foot Pain    Patient came in today for right lower leg pain and edema, follow-up, rate of pain 5 out of 10, swelling redness,     HPI: 67 y.o. female presenting today for follow-up evaluation of swelling to the right lower extremity.  Moderate pain.  Patient states that she has been evaluated by vascular and has had angioplasty of the right lower extremity.  She continues to have swelling to the calf and leg.  She says that she wears her compression hose occasionally.  Past Medical History:  Diagnosis Date   Cataracts, bilateral    Chickenpox cataracts   COPD (chronic obstructive pulmonary disease) (HCC)    Depression    Drug addiction (HCC)    Headache    Hepatitis C virus infection    Hepatitis C without hepatic coma    History of chicken pox    History of colon polyps    Seizures (HCC)    Stroke [facility])     Past Surgical History:  Procedure Laterality Date   APPENDECTOMY     CHOLECYSTECTOMY     COLONOSCOPY WITH PROPOFOL N/A 04/30/2015   Procedure: COLONOSCOPY WITH PROPOFOL;  Surgeon: Elnita Maxwell, MD;  Location: [facility] ENDOSCOPY;  Service: Endoscopy;  Laterality: N/A;   COLONOSCOPY WITH PROPOFOL N/A 07/13/2017   Procedure: COLONOSCOPY WITH PROPOFOL;  Surgeon: Scot Jun, MD;  Location: [facility] ENDOSCOPY;  Service: Endoscopy;  Laterality: N/A;   ESOPHAGOGASTRODUODENOSCOPY (EGD) WITH PROPOFOL N/A 07/13/2017   Procedure: ESOPHAGOGASTRODUODENOSCOPY (EGD) WITH PROPOFOL;  Surgeon: Scot Jun, MD;  Location: [facility] ENDOSCOPY;  Service: Endoscopy;  Laterality: N/A;   EYE SURGERY      Allergies  Allergen Reactions   Sulfasalazine Shortness Of Breath   Aspirin    Darvon [Propoxyphene]    Iodine    Penicillins    Sulfa Antibiotics    Zithromax [Azithromycin]      Physical Exam: General: The patient is alert and oriented x3 in no acute distress.  Dermatology: Skin is warm, dry and supple bilateral lower extremities.  Negative for open lesions or macerations.  There is a hyperkeratotic preulcerative callus lesion to the plantar aspect of the first MTP right  Vascular: VAS [place] ABI W/WO TBI 11/06/2022 ABI Findings:  +---------+------------------+-----+---------+--------+  Right   Rt Pressure (mmHg)IndexWaveform Comment   +---------+------------------+-----+---------+--------+  Brachial 180                                       +---------+------------------+-----+---------+--------+  ATA     190               1.02 triphasic          +---------+------------------+-----+---------+--------+  PTA     199               1.07 triphasic          +---------+------------------+-----+---------+--------+  Great Toe157               0.84 Normal             +---------+------------------+-----+---------+--------+   +---------+------------------+-----+---------+-------+  Left    Lt Pressure (mmHg)IndexWaveform Comment  +---------+------------------+-----+---------+-------+  Brachial 186                                      +---------+------------------+-----+---------+-------+  ATA     189               1.02 triphasic         +---------+------------------+-----+---------+-------+  PTA     196               1.05 triphasic         +---------+------------------+-----+---------+-------+  Great Toe190               1.02 Normal            +---------+------------------+-----+---------+-------+   +-------+-----------+-----------+------------+------------+  ABI/TBIToday's ABIToday's TBIPrevious ABIPrevious TBI  +-------+-----------+-----------+------------+------------+  Right 1.07       .84        1.13        .77           +-------+-----------+-----------+------------+------------+  Left  1.05       1.02       1.07        .75           +-------+-----------+-----------+------------+------------+  Bilateral ABIs appear essentially unchanged compared  to prior study on  05/08/2022. Bilateral TBIs appear increased compared to prior study on  05/08/2022.    Summary:  Right: Resting right ankle-brachial index is within normal range. The  right toe-brachial index is normal.  Left: Resting left ankle-brachial index is within normal range. The left  toe-brachial index is normal.   US VENOUS IMG RLE (DVT) 11/12/2021 FINDINGS VENOUS Normal compressibility of the common femoral, superficial femoral, and popliteal veins, as well as the visualized calf veins. Visualized portions of profunda femoral vein and great saphenous vein unremarkable. No filling defects to suggest DVT on grayscale or color Doppler imaging. Doppler waveforms show normal direction of venous flow, normal respiratory plasticity and response to augmentation. Limited views of the contralateral common femoral vein are unremarkable.   Other: Approximately 2.0 x 0.7 x 1.4 cm cyst within the right popliteal fossa, probably a Baker's cyst. Right calf edema.   IMPRESSION: 1. No evidence of DVT in the right lower extremity. 2. Probable 2.0 cm Baker's cyst. 3. Calf edema.  Neurological: Light touch and protective threshold grossly intact  Musculoskeletal Exam: No pedal deformities noted.  There is associated tenderness to palpation of the right leg and calf  Assessment: 1.  Edema RLE with associated tenderness 2.  Preulcerative callus lesion plantar aspect of the first MTP right  -Patient evaluated -Excisional debridement of the hyperkeratotic callus was performed of the first MTP right.  Patient felt relief -Advised against going barefoot.  Recommend good supportive shoes and sneakers -Stressed the importance of wearing compression hose knee-high daily.  Patient admits to not wearing the compression hose daily -Return to clinic as needed      Felecia Shelling, DPM [facility]  Dr. Felecia Shelling, DPM    [address] [place], [place] [address]                Office [phone]  Fax [phone]

## 2023-02-10 ENCOUNTER — Ambulatory Visit (INDEPENDENT_AMBULATORY_CARE_PROVIDER_SITE_OTHER): Payer: BC Managed Care – PPO | Admitting: Adult Health

## 2023-02-10 ENCOUNTER — Encounter: Payer: Self-pay | Admitting: Adult Health

## 2023-02-10 VITALS — BP 120/63 | HR 64 | Ht 64.0 in | Wt 135.0 lb

## 2023-02-10 DIAGNOSIS — I69319 Unspecified symptoms and signs involving cognitive functions following cerebral infarction: Secondary | ICD-10-CM | POA: Diagnosis not present

## 2023-02-10 DIAGNOSIS — I63412 Cerebral infarction due to embolism of left middle cerebral artery: Secondary | ICD-10-CM | POA: Diagnosis not present

## 2023-02-10 NOTE — Patient Instructions (Signed)
Continue to monitor memory, ensure routine memory exercises as well as physical activity, ensuring adequate sleep and healthy diet.  Continue to follow with PCP for stroke risk factor management.  Can consider use of medication such as Namenda in the future if memory starts to decline  Continue clopidogrel 75 mg daily  and atorvastatin for secondary stroke prevention  Continue to follow up with PCP regarding blood pressure and cholesterol management  Maintain strict control of hypertension with blood pressure goal below 130/90 and cholesterol with LDL cholesterol (bad cholesterol) goal below 70 mg/dL.   Signs of a Stroke? Follow the BEFAST method:  Balance Watch for a sudden loss of balance, trouble with coordination or vertigo Eyes Is there a sudden loss of vision in one or both eyes? Or double vision?  Face: Ask the person to smile. Does one side of the face droop or is it numb?  Arms: Ask the person to raise both arms. Does one arm drift downward? Is there weakness or numbness of a leg? Speech: Ask the person to repeat a simple phrase. Does the speech sound slurred/strange? Is the person confused ? Time: If you observe any of these signs, call 911.       Thank you for coming to see [place] at [facility]. I hope we have been able to provide you high quality care today.  You may receive a patient satisfaction survey over the next few weeks. We would appreciate your feedback and comments so that we may continue to improve ourselves and the health of our patients.

## 2023-02-10 NOTE — Progress Notes (Signed)
[facility] [address] [place]. [place] [address] [phone]       STROKE FOLLOW UP NOTE  [name] Date of Birth:  09-12-1955 Medical Record Number:  [id number]   Reason for Referral: stroke follow up    SUBJECTIVE:   CHIEF COMPLAINT:  Chief Complaint  Patient presents with   Follow-up    Rm 3, here with son [name]  Pt is here for stroke follow up. Pt states she has been doing good. Pt states memory has been better and not having any issues.      HPI:   Update 02/10/2023 JM: Patient returns for routine follow-up accompanied by her son [name].  ED evaluation 5/8 for several days of congested cough, shortness of breath, low O2 readings and confusion with word finding difficulty.  Evidence of pneumonia on chest x-ray and treated with antibiotics.  Evaluated by neurology, completed EEG without evidence of seizures, completed MRI brain without acute findings.  Reports cognition did decline some after ER visit in May but has since returned back to baseline.  Denies new stroke/TIA symptoms.  Compliant on medications.  Follows with PCP for aggressive stroke risk factor management, has f/u visit next month.  She is also since reestablished care with pulmonology for known history of COPD currently on oxygen 3L [place].    History provided for reference purposes only Update 06/11/2022 JM: Patient returns for 61-month follow-up accompanied by her son and sister, Natalie [name].  Reports cognition has been relatively stable since prior visit.  MMSE today 26/30 (prior 23/30). Did have some slight worsening on Monday but did not sleep well at all the night before, was back to baseline Tuesday morning after getting a good night sleep. She can have fluctuation at times so this is not new.  She continues to maintain all ADLs independently.  Son does assist with some IADLs.  Stable from stroke standpoint without new stroke/TIA symptoms.  Compliant on recommended medications.   Routinely follows with PCP.  Update 12/04/2021 JM: patient returns per request due to memory concerns. Completed MR brain after prior visit for visual concerns but MRI unremarkable for new findings. When she was called for results, she mentioned concerns of her memory worsening since her stroke. At prior visit, she reported her cognition had improved since her stroke and felt to be at baseline.  Today, son believes she has been forgetting peoples names more frequently. Can mix up names or say incorrect words, she will usually realize she says the wrong word and correct herself.  She will have good days and bad days.  This has been persistent since her stroke but believes over the past few months, this has slightly worsened. Will do memory and word games on her tablet but hasn't been doing these exercises more recently. MMSE today 23/30 with similar results back in September and even May 2022.  Compliant on all stroke prevention medications.  Routinely follows with PCP.  No further concerns at this time.  Update 09/23/2021 JM: patient returns for 6 months follow-up accompanied by her son.  Overall stable since prior visit without new stroke/TIA symptoms.  Reports residual occasional word finding difficulty although greatly improved. Cognition also greatly improved and currently at baseline. Son lives with her for 1 week at a time and then his own place for 1 week and keeps that rotation. She maintains ADLs and majority of IADLs, son helps with bill paying and other assistance as needed. She does mention gradual decline of  her vision (see A/P), plans on scheduling f/u visit with ophthalmologist soon.  Compliant on Plavix and atorvastatin, denies side effects.  Blood pressure today 126/77. Routinely monitors at home, typically stable. She has not had recent f/u with PCP.  No further concerns at this time.  Update 03/14/2021 JM: [name] returns for 59-month stroke follow-up accompanied by her son.  Overall doing well.   Denies new stroke/TIA symptoms. Reports occasional word finding difficulty but overall improving and good with compensating.  MMSE today 23/30 (prior 22/30 - during stroke admission 12/30). She continues to maintain ADLs and majority of IADLs independently.  Her son does continue to live with her but plans on moving out soon.  She does question return to driving.  Compliant on Plavix and atorvastatin.  Blood pressure today 162/86 -similar on recheck. Routinely monitors at home and typically stable. No new concerns at this time  Initial visit 11/15/2020 JM: [name] is being seen for hospital follow-up accompanied by her son.  She was discharged home from CIR on 10/19/2020 after an 8-day stay.  Completed HH SLP on Monday with residual cognitive difficulties but overall recovering well and per son, he believes she has been doing better than her cognitive baseline prior to her stroke.  She continues to routinely do exercises as advised at home. Remains in her own home but her son has been staying with her.  She is able to maintain ADLs and majority of IADLs independently and son will assist as needed.  MMSE today 22/30 (12/30 during hospitalization).  She had greater difficulty with focusing and concentration as when we would move on to the next question such as place, she continuously tried to go back to time questions.  She has not yet returned back to driving.  Denies any functional deficits.  Denies new stroke/TIA symptoms  Remains on Plavix and atorvastatin without associated side effects Blood pressure today 141/69 -does not routinely monitor at home is typically stable PCP recently recheck B12 level which was >4000 therefore supplement placed on hold and plans on repeating level in a couple of months Denies any current tobacco use (quit 6 years ago), EtOH or drug use Continued use of home oxygen 3L via [place] as needed Completed 30-day cardiac event monitor which did not show evidence of atrial  fibrillation  No further concerns at this time  Stroke admission 09/30/2020 Ms. SUZZETTE [name] is a 67 y.o. female with history of Cataracts, bilateral, Chickenpox (cataracts), COPD (chronic obstructive pulmonary disease) (HCC), Depression, Drug addiction (HCC), Headache, Hepatitis C virus infection, Hepatitis C without hepatic coma, History of chicken pox, History of colon polyps, and Seizures (HCC) who presented on 09/30/2020 with altered mental status on day of admission.  Personally reviewed hospitalization pertinent progress notes, lab work and imaging with summary provided.  Stroke team evaluated patient on 10/10/2020 after repeat MRI on 4/5 showed acute infarct of left MCA (initial MRI negative).  Concern of embolic pattern and recommended 30 cardiac event monitor outpatient to further evaluate for possible A. fib. Recommended starting plavix with hx of aspirin intolerance. Concern of cognitive impairment with MMSE 12/30 with lack of insight, disorientation and confabulation - B12 85 and initiated supplement. LDL 136 - started atorvastatin 40mg  daily. Other stroke risk factors include tobacco use, migraines and questionable EtOH use. Evaluated by therapies and recommended CIR for ongoing therapy needs.   Stroke: Anterior Left MCA Infarct, embolic pattern, source unclear MRI brain left anterior MCA infarct MRA head and neck  negative LE venous Doppler negative Recommend 30 day cardioembolic monitoring 2D Echo : LVEF: 55 to 60% LDL 136 HgbA1c 5.6 UDS + methadone (home meds) VTE prophylaxis -  SCDs No antithrombotic prior to admission, now on clopidogrel 75 mg daily. Allergy to ASA. Therapy recommendations:  CIR recommended and accepted Disposition:  d/c to CIR on 4/7     ROS:   14 system review of systems performed and negative with exception of those listed in HPI    PMH:  Past Medical History:  Diagnosis Date   Cataracts, bilateral    Chickenpox cataracts   COPD (chronic obstructive  pulmonary disease) (HCC)    Depression    Drug addiction (HCC)    Headache    Hepatitis C virus infection    Hepatitis C without hepatic coma    History of chicken pox    History of colon polyps    Seizures (HCC)    Stroke (HCC)     PSH:  Past Surgical History:  Procedure Laterality Date   APPENDECTOMY     CHOLECYSTECTOMY     COLONOSCOPY WITH PROPOFOL N/A 04/30/2015   Procedure: COLONOSCOPY WITH PROPOFOL;  Surgeon: Elnita Maxwell, MD;  Location: [facility] ENDOSCOPY;  Service: Endoscopy;  Laterality: N/A;   COLONOSCOPY WITH PROPOFOL N/A 07/13/2017   Procedure: COLONOSCOPY WITH PROPOFOL;  Surgeon: Scot Jun, MD;  Location: [facility] ENDOSCOPY;  Service: Endoscopy;  Laterality: N/A;   ESOPHAGOGASTRODUODENOSCOPY (EGD) WITH PROPOFOL N/A 07/13/2017   Procedure: ESOPHAGOGASTRODUODENOSCOPY (EGD) WITH PROPOFOL;  Surgeon: Scot Jun, MD;  Location: [facility] ENDOSCOPY;  Service: Endoscopy;  Laterality: N/A;   EYE SURGERY     LOWER EXTREMITY ANGIOGRAPHY Right 01/01/2022   Procedure: Lower Extremity Angiography;  Surgeon: Annice Needy, MD;  Location: [facility] INVASIVE CV LAB;  Service: Cardiovascular;  Laterality: Right;    Social History:  Social History   Socioeconomic History   Marital status: Married    Spouse name: Not on file   Number of children: 2   Years of education: GED   Highest education level: Not on file  Occupational History   Not on file  Tobacco Use   Smoking status: Former    Current packs/day: 0.00    Average packs/day: 0.5 packs/day for 41.0 years (20.5 ttl pk-yrs)    Types: Cigarettes    Start date: 10/27/1974    Quit date: 10/27/2015    Years since quitting: 7.2   Smokeless tobacco: Never  Vaping Use   Vaping status: Never Used  Substance and Sexual Activity   Alcohol use: No   Drug use: No   Sexual activity: Not Currently  Other Topics Concern   Not on file  Social History Narrative   03/14/21 son with her now, he lives 20 min away   significant other  recently passed away   Social Determinants of Health   Financial Resource Strain: Not on file  Food Insecurity: No Food Insecurity (11/13/2022)   Hunger Vital Sign    Worried About Running Out of Food in the Last Year: Never true    Ran Out of Food in the Last Year: Never true  Transportation Needs: No Transportation Needs (11/13/2022)   PRAPARE - Administrator, Civil Service (Medical): No    Lack of Transportation (Non-Medical): No  Physical Activity: Not on file  Stress: Not on file  Social Connections: Not on file  Intimate Partner Violence: Not At Risk (11/13/2022)   Humiliation, Afraid, Rape, and Kick questionnaire  Fear of Current or Ex-Partner: No    Emotionally Abused: No    Physically Abused: No    Sexually Abused: No    Family History:  Family History  Problem Relation Age of Onset   Breast cancer Paternal Grandmother     Medications:   Current Outpatient Medications on File Prior to Visit  Medication Sig Dispense Refill   amLODipine (NORVASC) 5 MG tablet Take 5 mg by mouth daily.     atorvastatin (LIPITOR) 40 MG tablet Take 40 mg by mouth daily.     clopidogrel (PLAVIX) 75 MG tablet Take 1 tablet (75 mg total) by mouth daily. 30 tablet 0   losartan (COZAAR) 25 MG tablet Take 25 mg by mouth daily.     methadone (DOLOPHINE) 1 mg/ml oral solution Take 47 mg by mouth daily.     ipratropium-albuterol (DUONEB) 0.5-2.5 (3) MG/3ML SOLN Take by nebulization. (Patient not taking: Reported on 02/10/2023)     No current facility-administered medications on file prior to visit.    Allergies:   Allergies  Allergen Reactions   Sulfasalazine Shortness Of Breath   Aspirin    Darvon [Propoxyphene]    Iodine    Penicillins    Sulfa Antibiotics    Zithromax [Azithromycin]       OBJECTIVE:  Physical Exam  Vitals:   02/10/23 1259  BP: 120/63  Pulse: 64  Weight: 135 lb (61.2 kg)  Height: 5\' 4"  (1.626 m)   Body mass index is 23.17 kg/m. No results  found.  General: Frail very pleasant middle-aged Caucasian female, seated, in no evident distress, use of O2 via [place] 3 L Head: head normocephalic and atraumatic.   Neck: supple with no carotid or supraclavicular bruits Cardiovascular: regular rate and rhythm, no murmurs Musculoskeletal: no deformity Skin:  no rash/petichiae Vascular:  Normal pulses all extremities   Neurologic Exam Mental Status: Awake and fully alert.   Unable to appreciate aphasia on today's exam.. Recent memory mildly impaired and remote memory intact. Attention span, concentration and fund of knowledge appropriate but son will provide some history.  Mood and affect appropriate.     02/10/2023    1:22 PM 06/11/2022    1:08 PM 12/04/2021   12:57 PM  MMSE - Mini Mental State Exam  Orientation to time 3 4 5   Orientation to Place 3 3 4   Registration 3 3 3   Attention/ Calculation 5 5 1   Recall 1 2 2   Language- name 2 objects 2 2 2   Language- repeat 1 1 1   Language- follow 3 step command 2 3 3   Language- read & follow direction 1 1 1   Write a sentence 1 1 1   Copy design 1 1 0  Total score 23 26 23    Cranial Nerves: Pupils equal, briskly reactive to light. Extraocular movements full without nystagmus. Visual fields show blurred vision in left periphery bilaterally. Hearing intact. Facial sensation intact. Face, tongue, palate moves normally and symmetrically.  Motor: Normal bulk and tone. Normal strength in all tested extremity muscles Sensory.: intact to touch , pinprick , position and vibratory sensation.  Coordination: Rapid alternating movements normal in all extremities. Finger-to-nose and heel-to-shin performed accurately bilaterally. Gait and Station: Arises from chair without difficulty. Stance is normal. Gait demonstrates normal stride length and balance without use of assistive device.  Reflexes: 1+ and symmetric. Toes downgoing.         ASSESSMENT: Natalie [name] is a 67 y.o. year old female presented with  altered  mental status on 09/30/2020 with initial MRI negative. On 4/5, MRI showed acute infarct of L MCA infarct, embolic secondary to unknown source. Vascular risk factors include HTN, HLD, hx of tobacco use, B12 deficiency and migraines.     PLAN:  Mild cognitive impairment:  Overall stable since prior visit, did worsen some in setting of pneumonia in May but has since returned back to baseline MMSE today 23/30 (prior 26/30)   Can consider use of Namenda in the future if needed but as memory nonprogressive, will hold off at this time.  discussed importance of routine memory exercises as well as routine physical activity, good sleep hygiene and healthy diet  L MCA stroke :  Residual deficit: occasional word finding difficulty.  Stable.  Cardiac monitor negative for atrial fibrillation Continue clopidogrel 75 mg daily  and atorvastatin  for secondary stroke prevention.   Discussed secondary stroke prevention measures and importance of close PCP follow up for aggressive stroke risk factor management including HTN BP goal<130/90 and HLD with LDL goal<70    Doing well from stroke standpoint without further recommendations and risk factors are managed by PCP. She may follow up PRN, as usual for our patients who are strictly being followed for stroke. If any new neurological issues should arise, request PCP place referral for evaluation by one of our neurologists. Thank you.      CC:  PCP: Rayetta Humphrey, MD    I spent 30 minutes of face-to-face and non-face-to-face time with patient and son.  This included previsit chart review, discussion and education regarding above diagnoses and treatment plan and answered all other questions to patient and son satisfaction  Ihor Austin, AGNP-BC  [facility] [address] [place], [place] [address]  Phone [phone] Fax [phone] Note: This document was prepared with digital dictation and possible smart  phrase technology. Any transcriptional errors that result from this process are unintentional.

## 2023-03-12 ENCOUNTER — Emergency Department
Admission: EM | Admit: 2023-03-12 | Discharge: 2023-03-13 | Disposition: A | Discharge status: Home / Self Care | Payer: BC Managed Care – PPO | Attending: Emergency Medicine | Admitting: Emergency Medicine

## 2023-03-12 ENCOUNTER — Emergency Department: Payer: BC Managed Care – PPO

## 2023-03-12 ENCOUNTER — Other Ambulatory Visit: Payer: Self-pay

## 2023-03-12 DIAGNOSIS — G43809 Other migraine, not intractable, without status migrainosus: Secondary | ICD-10-CM | POA: Diagnosis not present

## 2023-03-12 DIAGNOSIS — R519 Headache, unspecified: Secondary | ICD-10-CM | POA: Diagnosis present

## 2023-03-12 DIAGNOSIS — R531 Weakness: Secondary | ICD-10-CM | POA: Insufficient documentation

## 2023-03-12 DIAGNOSIS — J449 Chronic obstructive pulmonary disease, unspecified: Secondary | ICD-10-CM | POA: Diagnosis not present

## 2023-03-12 DIAGNOSIS — Z79899 Other long term (current) drug therapy: Secondary | ICD-10-CM | POA: Insufficient documentation

## 2023-03-12 DIAGNOSIS — N183 Chronic kidney disease, stage 3 unspecified: Secondary | ICD-10-CM | POA: Insufficient documentation

## 2023-03-12 LAB — CBC WITH DIFFERENTIAL/PLATELET
Abs Immature Granulocytes: 0.03 10*3/uL (ref 0.00–0.07)
Basophils Absolute: 0 10*3/uL (ref 0.0–0.1)
Basophils Relative: 0 %
Eosinophils Absolute: 0 10*3/uL (ref 0.0–0.5)
Eosinophils Relative: 0 %
HCT: 48.7 % — ABNORMAL HIGH (ref 36.0–46.0)
Hemoglobin: 16.1 g/dL — ABNORMAL HIGH (ref 12.0–15.0)
Immature Granulocytes: 0 %
Lymphocytes Relative: 7 %
Lymphs Abs: 0.7 10*3/uL (ref 0.7–4.0)
MCH: 30.7 pg (ref 26.0–34.0)
MCHC: 33.1 g/dL (ref 30.0–36.0)
MCV: 92.8 fL (ref 80.0–100.0)
Monocytes Absolute: 0.5 10*3/uL (ref 0.1–1.0)
Monocytes Relative: 5 %
Neutro Abs: 9.4 10*3/uL — ABNORMAL HIGH (ref 1.7–7.7)
Neutrophils Relative %: 88 %
Platelets: 195 10*3/uL (ref 150–400)
RBC: 5.25 MIL/uL — ABNORMAL HIGH (ref 3.87–5.11)
RDW: 14 % (ref 11.5–15.5)
WBC: 10.7 10*3/uL — ABNORMAL HIGH (ref 4.0–10.5)
nRBC: 0 % (ref 0.0–0.2)

## 2023-03-12 LAB — COMPREHENSIVE METABOLIC PANEL
ALT: 19 U/L (ref 0–44)
AST: 21 U/L (ref 15–41)
Albumin: 5.3 g/dL — ABNORMAL HIGH (ref 3.5–5.0)
Alkaline Phosphatase: 104 U/L (ref 38–126)
Anion gap: 14 (ref 5–15)
BUN: 16 mg/dL (ref 8–23)
CO2: 25 mmol/L (ref 22–32)
Calcium: 10 mg/dL (ref 8.9–10.3)
Chloride: 97 mmol/L — ABNORMAL LOW (ref 98–111)
Creatinine, Ser: 1.07 mg/dL — ABNORMAL HIGH (ref 0.44–1.00)
GFR, Estimated: 58 mL/min — ABNORMAL LOW (ref >60)
Glucose, Bld: 117 mg/dL — ABNORMAL HIGH (ref 70–99)
Potassium: 3.7 mmol/L (ref 3.5–5.1)
Sodium: 136 mmol/L (ref 135–145)
Total Bilirubin: 2.9 mg/dL — ABNORMAL HIGH (ref 0.3–1.2)
Total Protein: 9.1 g/dL — ABNORMAL HIGH (ref 6.5–8.1)

## 2023-03-12 MED ORDER — DEXAMETHASONE SODIUM PHOSPHATE 10 MG/ML IJ SOLN
10.0000 mg | Freq: Once | INTRAMUSCULAR | Status: AC
  Administered 2023-03-13: 10 mg via INTRAVENOUS
  Filled 2023-03-12: qty 1

## 2023-03-12 MED ORDER — METOCLOPRAMIDE HCL 5 MG/ML IJ SOLN
5.0000 mg | Freq: Once | INTRAMUSCULAR | Status: AC
  Administered 2023-03-13: 5 mg via INTRAVENOUS
  Filled 2023-03-12: qty 2

## 2023-03-12 MED ORDER — SODIUM CHLORIDE 0.9 % IV BOLUS
1000.0000 mL | Freq: Once | INTRAVENOUS | Status: AC
  Administered 2023-03-13: 1000 mL via INTRAVENOUS

## 2023-03-12 NOTE — ED Provider Notes (Incomplete)
[facility] Provider Note    Event Date/Time   First MD Initiated Contact with Patient 03/12/23 2304     (approximate)   History   Migraine   HPI  Natalie [name] is a 67 y.o. female  who presents to the ED from home with a chief complaint of migraine headache. Patient reports a one day history of gradual onset right sided headache associate with nausea and vomiting. History of migraine headaches, on daily Methadone. History of stroke. States she is not on blood thinners but Plavix is listed on her home medication list. Denies fever, chills, chest pain, shortness of breath, abdominal pain, diarrhea, dizziness. Mild residual right sided weakness s/p previous stroke. Does not use assisted devices. Denies slurred speech, confusion, extremity weakness/numbness/tingling.       Past Medical History   Past Medical History:  Diagnosis Date  . Cataracts, bilateral   . Chickenpox cataracts  . COPD (chronic obstructive pulmonary disease) (HCC)   . Depression   . Drug addiction (HCC)   . Headache   . Hepatitis C virus infection   . Hepatitis C without hepatic coma   . History of chicken pox   . History of colon polyps   . Seizures (HCC)   . Stroke [facility])      Active Problem List   Patient Active Problem List   Diagnosis Date Noted  . CAP (community acquired pneumonia) 11/12/2022  . History of CVA (cerebrovascular accident) 11/12/2022  . Methadone maintenance therapy patient (HCC) 11/12/2022  . Acute metabolic encephalopathy 11/12/2022  . Hypertensive emergency 11/12/2022  . Swelling of limb 11/26/2021  . Frontal lobe and executive function deficit   . Hypoalbuminemia due to protein-calorie malnutrition (HCC)   . Chronic pain syndrome   . Dysphagia, post-stroke   . Acute ischemic left MCA stroke (HCC) 10/11/2020  . Aphasia due to acute cerebrovascular accident (CVA) (HCC) 10/11/2020  . Cerebral thrombosis with cerebral infarction 10/10/2020  .  Malnutrition of moderate degree 10/01/2020  . Pressure injury of skin 09/30/2020  . Encephalopathy acute 09/30/2020  . Altered mental status   . Acute respiratory failure with hypoxia (HCC)   . Encephalopathy 09/28/2020  . Status epilepticus (HCC)   . NSTEMI (non-ST elevated myocardial infarction) (HCC) 07/24/2017  . Cataracts, bilateral 03/30/2017  . COPD (chronic obstructive pulmonary disease) (HCC) 03/30/2017  . Depression 03/30/2017  . Drug addiction (HCC) 03/30/2017  . Seizures (HCC) 03/30/2017  . Chronic migraine without aura without status migrainosus, not intractable 07/26/2014  . 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 CKD (chronic kidney disease) stage 3, GFR 30-59 ml/min (HCC) 08/04/2013  . Edema 03/24/2011  . Tobacco use disorder 03/24/2011  . 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 Migraine headache 01/10/2011     Past Surgical History   Past Surgical History:  Procedure Laterality Date  . APPENDECTOMY    . CHOLECYSTECTOMY    . COLONOSCOPY WITH PROPOFOL N/A 04/30/2015   Procedure: COLONOSCOPY WITH PROPOFOL;  Surgeon: Elnita Maxwell, MD;  Location: [facility] ENDOSCOPY;  Service: Endoscopy;  Laterality: N/A;  . COLONOSCOPY WITH PROPOFOL N/A 07/13/2017   Procedure: COLONOSCOPY WITH PROPOFOL;  Surgeon: Scot Jun, MD;  Location: [facility] ENDOSCOPY;  Service: Endoscopy;  Laterality: N/A;  . ESOPHAGOGASTRODUODENOSCOPY (EGD) WITH PROPOFOL N/A 07/13/2017   Procedure: ESOPHAGOGASTRODUODENOSCOPY (EGD) WITH PROPOFOL;  Surgeon: Scot Jun, MD;  Location: [facility] ENDOSCOPY;  Service: Endoscopy;  Laterality: N/A;  . EYE SURGERY    . LOWER  EXTREMITY ANGIOGRAPHY Right 01/01/2022   Procedure: Lower Extremity Angiography;  Surgeon: Annice Needy, MD;  Location: [facility] INVASIVE CV LAB;  Service: Cardiovascular;  Laterality: Right;     Home Medications   Prior to Admission medications   Medication Sig Start Date End Date Taking? Authorizing Provider   amLODipine (NORVASC) 5 MG tablet Take 5 mg by mouth daily. 09/06/21   [provider]  atorvastatin (LIPITOR) 40 MG tablet Take 40 mg by mouth daily.    [provider]  clopidogrel (PLAVIX) 75 MG tablet Take 1 tablet (75 mg total) by mouth daily. 10/19/20   Love, Evlyn Kanner, PA-C  ipratropium-albuterol (DUONEB) 0.5-2.5 (3) MG/3ML SOLN Take by nebulization. Patient not taking: Reported on 02/10/2023 12/27/21   [provider]  losartan (COZAAR) 25 MG tablet Take 25 mg by mouth daily. 04/08/22 04/08/23  [provider]  methadone (DOLOPHINE) 1 mg/ml oral solution Take 47 mg by mouth daily.    [provider]     Allergies  Sulfasalazine, Aspirin, Darvon [propoxyphene], Iodine, Penicillins, Sulfa antibiotics, and Zithromax [azithromycin]   Family History   Family History  Problem Relation Age of Onset  . Breast cancer Paternal Grandmother      Physical Exam  Triage Vital Signs: ED Triage Vitals  Encounter Vitals Group     BP 03/12/23 1947 139/81     Systolic BP Percentile --      Diastolic BP Percentile --      Pulse Rate 03/12/23 1947 89     Resp 03/12/23 1947 20     Temp 03/12/23 1947 98.2 F (36.8 C)     Temp src --      SpO2 03/12/23 1947 99 %     Weight 03/12/23 1946 145 lb (65.8 kg)     Height 03/12/23 1946 5\' 5"  (1.651 m)     Head Circumference --      Peak Flow --      Pain Score 03/12/23 1946 8     Pain Loc --      Pain Education --      Exclude from Growth Chart --     Updated Vital Signs: BP 139/81   Pulse 89   Temp 98.2 F (36.8 C)   Resp 20   Ht 5\' 5"  (1.651 m)   Wt 65.8 kg   SpO2 99%   BMI 24.13 kg/m    General: Awake, mild distress.  CV:  RRR. Good peripheral perfusion.  Resp:  Normal effort. CTAB. On chronic oxygen. Abd:  Nontender. No distention.  Other:  PERRL. EOMI. Supple neck without meningsmus. No carotid bruits. Alert & oriented x 3. CN II-XII grossly intact. 5/5 motor strength and sensation all  extremities.    ED Results / Procedures / Treatments  Labs (all labs ordered are listed, but only abnormal results are displayed) Labs Reviewed  CBC WITH DIFFERENTIAL/PLATELET - Abnormal; Notable for the following components:      Result Value   WBC 10.7 (*)    RBC 5.25 (*)    Hemoglobin 16.1 (*)    HCT 48.7 (*)    Neutro Abs 9.4 (*)    All other components within normal limits  COMPREHENSIVE METABOLIC PANEL - Abnormal; Notable for the following components:   Chloride 97 (*)    Glucose, Bld 117 (*)    Creatinine, Ser 1.07 (*)    Total Protein 9.1 (*)    Albumin 5.3 (*)    Total Bilirubin 2.9 (*)  GFR, Estimated 58 (*)    All other components within normal limits  URINALYSIS, ROUTINE W REFLEX MICROSCOPIC     EKG  ED ECG REPORT I, Chayse Zatarain J, the attending physician, personally viewed and interpreted this ECG.   Date: 03/12/2023  EKG Time: ***  Rate: ***  Rhythm: {ekg findings:315101}  Axis: ***  Intervals:{conduction defects:17367}  ST&T Change: ***    RADIOLOGY *** {You MUST document your own interpretation of imaging, as well as the fact that you reviewed the radiologist's report!:1}  Official radiology report(s): CT HEAD WO CONTRAST ( )  Result Date: 03/12/2023 CLINICAL DATA:  Headache, classic migraine EXAM: CT HEAD WITHOUT CONTRAST TECHNIQUE: Contiguous axial images were obtained from the base of the skull through the vertex without intravenous contrast. RADIATION DOSE REDUCTION: This exam was performed according to the departmental dose-optimization program which includes automated exposure control, adjustment of the mA and/or kV according to patient size and/or use of iterative reconstruction technique. COMPARISON:  Head CT 11/12/2022, brain MRI 11/17/2022 FINDINGS: Brain: No evidence of acute infarction, hemorrhage, hydrocephalus, extra-axial collection or mass lesion/mass effect. Stable degree of generalized chronic small vessel ischemia. Remote left  frontal infarct is unchanged. Low left basal gangliar infarct unchanged. Vascular: Atherosclerosis of skullbase vasculature without hyperdense vessel or abnormal calcification. Skull: No fracture or focal lesion. Stable appearance of venous lakes. Sinuses/Orbits: Paranasal sinuses and mastoid air cells are clear. The visualized orbits are unremarkable. Other: None. IMPRESSION: 1. No acute intracranial abnormality. 2. Stable chronic small vessel ischemia and remote infarcts. Electronically Signed   By: Narda Rutherford M.D.   On: 03/12/2023 21:16     PROCEDURES:  Critical Care performed: {CriticalCareYesNo:19197::"Yes, see critical care procedure note(s)","No"}  Procedures   MEDICATIONS ORDERED IN ED: Medications - No data to display   IMPRESSION / MDM / ASSESSMENT AND PLAN / ED COURSE  I reviewed the triage vital signs and the nursing notes.                              Differential diagnosis includes, but is not limited to, ***  Patient's presentation is most consistent with {EM COPA:27473}  {If the patient is on the monitor, remove the brackets and asterisks on the sentence below and remember to document it as a Procedure as well. Otherwise delete the sentence below:1} {**The patient is on the cardiac monitor to evaluate for evidence of arrhythmia and/or significant heart rate changes.**}  {Remember to include, when applicable, any/all of the following data: independent review of imaging independent review of labs (comment specifically on pertinent positives and negatives) review of specific prior hospitalizations, PCP/specialist notes, etc. discuss meds given and prescribed document any discussion with consultants (including hospitalists) any clinical decision tools you used and why (PECARN, NEXUS, etc.) did you consider admitting the patient? document social determinants of health affecting patient's care (homelessness, inability to follow up in a timely fashion, etc) document  any pre-existing conditions increasing risk on current visit (e.g. diabetes and HTN increasing danger of high-risk chest pain/ACS) describes what meds you gave (especially parenteral) and why any other interventions?:1}      FINAL CLINICAL IMPRESSION(S) / ED DIAGNOSES   Final diagnoses:  None     Rx / DC Orders   ED Discharge Orders     None        Note:  This document was prepared using Dragon voice recognition software and may include unintentional dictation errors.

## 2023-03-12 NOTE — ED Triage Notes (Signed)
Pt states she developed a migraine earlier today that has been getting worse and since has developed n/v. Pt denies one sided weakness or numbness but states she has generalized weakness and doesn't feel right. Pt has hx stroke, not on blood thinners. Pts speech is at baseline no facial droop.

## 2023-03-12 NOTE — ED Provider Notes (Signed)
[facility] Provider Note    Event Date/Time   First MD Initiated Contact with Patient 03/12/23 2304     (approximate)   History   Migraine   HPI  Natalie [name] is a 67 y.o. female  who presents to the ED from home with a chief complaint of migraine headache. Patient reports a one day history of gradual onset right sided headache associate with nausea and vomiting. History of migraine headaches, on daily Methadone. History of stroke. States she is not on blood thinners but Plavix is listed on her home medication list. Denies fever, chills, chest pain, shortness of breath, abdominal pain, diarrhea, dizziness. Mild residual right sided weakness s/p previous stroke. Does not use assisted devices. Denies slurred speech, confusion, extremity weakness/numbness/tingling.       Past Medical History   Past Medical History:  Diagnosis Date  . Cataracts, bilateral   . Chickenpox cataracts  . COPD (chronic obstructive pulmonary disease) (HCC)   . Depression   . Drug addiction (HCC)   . Headache   . Hepatitis C virus infection   . Hepatitis C without hepatic coma   . History of chicken pox   . History of colon polyps   . Seizures (HCC)   . Stroke [facility])      Active Problem List   Patient Active Problem List   Diagnosis Date Noted  . CAP (community acquired pneumonia) 11/12/2022  . History of CVA (cerebrovascular accident) 11/12/2022  . Methadone maintenance therapy patient (HCC) 11/12/2022  . Acute metabolic encephalopathy 11/12/2022  . Hypertensive emergency 11/12/2022  . Swelling of limb 11/26/2021  . Frontal lobe and executive function deficit   . Hypoalbuminemia due to protein-calorie malnutrition (HCC)   . Chronic pain syndrome   . Dysphagia, post-stroke   . Acute ischemic left MCA stroke (HCC) 10/11/2020  . Aphasia due to acute cerebrovascular accident (CVA) (HCC) 10/11/2020  . Cerebral thrombosis with cerebral infarction 10/10/2020  .  Malnutrition of moderate degree 10/01/2020  . Pressure injury of skin 09/30/2020  . Encephalopathy acute 09/30/2020  . Altered mental status   . Acute respiratory failure with hypoxia (HCC)   . Encephalopathy 09/28/2020  . Status epilepticus (HCC)   . NSTEMI (non-ST elevated myocardial infarction) (HCC) 07/24/2017  . Cataracts, bilateral 03/30/2017  . COPD (chronic obstructive pulmonary disease) (HCC) 03/30/2017  . Depression 03/30/2017  . Drug addiction (HCC) 03/30/2017  . Seizures (HCC) 03/30/2017  . Chronic migraine without aura without status migrainosus, not intractable 07/26/2014  . 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 CKD (chronic kidney disease) stage 3, GFR 30-59 ml/min (HCC) 08/04/2013  . Edema 03/24/2011  . Tobacco use disorder 03/24/2011  . 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 Migraine headache 01/10/2011     Past Surgical History   Past Surgical History:  Procedure Laterality Date  . APPENDECTOMY    . CHOLECYSTECTOMY    . COLONOSCOPY WITH PROPOFOL N/A 04/30/2015   Procedure: COLONOSCOPY WITH PROPOFOL;  Surgeon: Elnita Maxwell, MD;  Location: [facility] ENDOSCOPY;  Service: Endoscopy;  Laterality: N/A;  . COLONOSCOPY WITH PROPOFOL N/A 07/13/2017   Procedure: COLONOSCOPY WITH PROPOFOL;  Surgeon: Scot Jun, MD;  Location: [facility] ENDOSCOPY;  Service: Endoscopy;  Laterality: N/A;  . ESOPHAGOGASTRODUODENOSCOPY (EGD) WITH PROPOFOL N/A 07/13/2017   Procedure: ESOPHAGOGASTRODUODENOSCOPY (EGD) WITH PROPOFOL;  Surgeon: Scot Jun, MD;  Location: [facility] ENDOSCOPY;  Service: Endoscopy;  Laterality: N/A;  . EYE SURGERY    . LOWER  EXTREMITY ANGIOGRAPHY Right 01/01/2022   Procedure: Lower Extremity Angiography;  Surgeon: Annice Needy, MD;  Location: [facility] INVASIVE CV LAB;  Service: Cardiovascular;  Laterality: Right;     Home Medications   Prior to Admission medications   Medication Sig Start Date End Date Taking? Authorizing Provider   amLODipine (NORVASC) 5 MG tablet Take 5 mg by mouth daily. 09/06/21   [provider]  atorvastatin (LIPITOR) 40 MG tablet Take 40 mg by mouth daily.    [provider]  clopidogrel (PLAVIX) 75 MG tablet Take 1 tablet (75 mg total) by mouth daily. 10/19/20   Love, Evlyn Kanner, PA-C  ipratropium-albuterol (DUONEB) 0.5-2.5 (3) MG/3ML SOLN Take by nebulization. Patient not taking: Reported on 02/10/2023 12/27/21   [provider]  losartan (COZAAR) 25 MG tablet Take 25 mg by mouth daily. 04/08/22 04/08/23  [provider]  methadone (DOLOPHINE) 1 mg/ml oral solution Take 47 mg by mouth daily.    [provider]     Allergies  Sulfasalazine, Aspirin, Darvon [propoxyphene], Iodine, Penicillins, Sulfa antibiotics, and Zithromax [azithromycin]   Family History   Family History  Problem Relation Age of Onset  . Breast cancer Paternal Grandmother      Physical Exam  Triage Vital Signs: ED Triage Vitals  Encounter Vitals Group     BP 03/12/23 1947 139/81     Systolic BP Percentile --      Diastolic BP Percentile --      Pulse Rate 03/12/23 1947 89     Resp 03/12/23 1947 20     Temp 03/12/23 1947 98.2 F (36.8 C)     Temp src --      SpO2 03/12/23 1947 99 %     Weight 03/12/23 1946 145 lb (65.8 kg)     Height 03/12/23 1946 5\' 5"  (1.651 m)     Head Circumference --      Peak Flow --      Pain Score 03/12/23 1946 8     Pain Loc --      Pain Education --      Exclude from Growth Chart --     Updated Vital Signs: BP 136/74   Pulse 73   Temp 98.1 F (36.7 C) (Oral)   Resp 16   Ht 5\' 5"  (1.651 m)   Wt 65.8 kg   SpO2 96%   BMI 24.13 kg/m    General: Awake, mild distress.  CV:  RRR. Good peripheral perfusion.  Resp:  Normal effort. CTAB. On chronic oxygen. Abd:  Nontender. No distention.  Other:  PERRL. EOMI. Supple neck without meningsmus. No carotid bruits. Alert & oriented x 3. CN II-XII grossly intact. 5/5 motor strength and sensation  all extremities.    ED Results / Procedures / Treatments  Labs (all labs ordered are listed, but only abnormal results are displayed) Labs Reviewed  CBC WITH DIFFERENTIAL/PLATELET - Abnormal; Notable for the following components:      Result Value   WBC 10.7 (*)    RBC 5.25 (*)    Hemoglobin 16.1 (*)    HCT 48.7 (*)    Neutro Abs 9.4 (*)    All other components within normal limits  COMPREHENSIVE METABOLIC PANEL - Abnormal; Notable for the following components:   Chloride 97 (*)    Glucose, Bld 117 (*)    Creatinine, Ser 1.07 (*)    Total Protein 9.1 (*)    Albumin 5.3 (*)    Total Bilirubin 2.9 (*)  GFR, Estimated 58 (*)    All other components within normal limits  URINALYSIS, ROUTINE W REFLEX MICROSCOPIC     EKG  ED ECG REPORT I, Rimas Gilham J, the attending physician, personally viewed and interpreted this ECG.   Date: 03/12/2023  EKG Time: 1956  Rate: 89  Rhythm: normal sinus rhythm  Axis: Normal  Intervals:none  ST&T Change: Nonspecific    RADIOLOGY I have independently vision was interpreted patient's imaging studies as well as noted the radiology interpretation:  CT head: No ICH  MRI brain: No acute intracranial abnormality; chronic changes  Official radiology report(s): MR BRAIN WO CONTRAST  Result Date: 03/13/2023 CLINICAL DATA:  Initial evaluation for acute headache. EXAM: MRI HEAD WITHOUT CONTRAST TECHNIQUE: Multiplanar, multiecho pulse sequences of the brain and surrounding structures were obtained without intravenous contrast. COMPARISON:  CT from 03/12/2023 as well as previous MRI from 11/17/2022. FINDINGS: Brain: Diffuse prominence of the CSF containing spaces compatible with generalized cerebral atrophy, mild for age. Patchy and confluent T2/FLAIR hyperintensity involving the periventricular and deep white matter both cerebral hemispheres as well as the pons, consistent with chronic small vessel ischemic disease, advanced in nature. Remote lacunar  infarcts present about the right lentiform nucleus and left thalamus. Encephalomalacia and gliosis involving the left frontal operculum consistent with a chronic left MCA distribution infarct. No abnormal foci of restricted diffusion to suggest acute or subacute ischemia. Gray-white matter differentiation otherwise maintained. No acute intracranial hemorrhage. Single punctate chronic microhemorrhage noted within the left cerebellum, of doubtful significance in isolation. No mass lesion, midline shift or mass effect. No hydrocephalus or extra-axial fluid collection. Pituitary gland and suprasellar region within normal limits. Vascular: Major intracranial vascular flow voids are maintained. Skull and upper cervical spine: Craniocervical junction within normal limits. Bone marrow signal intensity normal. No scalp soft tissue abnormality. Sinuses/Orbits: Prior bilateral ocular lens replacement. Paranasal sinuses are clear. No mastoid effusion. Other: None. IMPRESSION: 1. No acute intracranial abnormality. 2. Chronic left MCA distribution infarct, with additional remote lacunar infarcts involving the right lentiform nucleus and left thalamus. 3. Underlying mild cerebral atrophy with advanced chronic microvascular ischemic disease. Electronically Signed   By: Rise Mu M.D.   On: 03/13/2023 01:55   CT HEAD WO CONTRAST ( )  Result Date: 03/12/2023 CLINICAL DATA:  Headache, classic migraine EXAM: CT HEAD WITHOUT CONTRAST TECHNIQUE: Contiguous axial images were obtained from the base of the skull through the vertex without intravenous contrast. RADIATION DOSE REDUCTION: This exam was performed according to the departmental dose-optimization program which includes automated exposure control, adjustment of the mA and/or kV according to patient size and/or use of iterative reconstruction technique. COMPARISON:  Head CT 11/12/2022, brain MRI 11/17/2022 FINDINGS: Brain: No evidence of acute infarction, hemorrhage,  hydrocephalus, extra-axial collection or mass lesion/mass effect. Stable degree of generalized chronic small vessel ischemia. Remote left frontal infarct is unchanged. Low left basal gangliar infarct unchanged. Vascular: Atherosclerosis of skullbase vasculature without hyperdense vessel or abnormal calcification. Skull: No fracture or focal lesion. Stable appearance of venous lakes. Sinuses/Orbits: Paranasal sinuses and mastoid air cells are clear. The visualized orbits are unremarkable. Other: None. IMPRESSION: 1. No acute intracranial abnormality. 2. Stable chronic small vessel ischemia and remote infarcts. Electronically Signed   By: Narda Rutherford M.D.   On: 03/12/2023 21:16     PROCEDURES:  Critical Care performed: No  Procedures   MEDICATIONS ORDERED IN ED: Medications  sodium chloride 0.9 % bolus 1,000 mL (0 mLs Intravenous Stopped 03/13/23 0135)  metoCLOPramide (REGLAN) injection  5 mg (5 mg Intravenous Given 03/13/23 0027)  dexamethasone (DECADRON) injection 10 mg (10 mg Intravenous Given 03/13/23 0028)     IMPRESSION / MDM / ASSESSMENT AND PLAN / ED COURSE  I reviewed the triage vital signs and the nursing notes.                             67 year old female presenting with headache, history of stroke. Differential diagnosis includes, but is not limited to, intracranial hemorrhage, meningitis/encephalitis, previous head trauma, cavernous venous thrombosis, tension headache, temporal arteritis, migraine or migraine equivalent, idiopathic intracranial hypertension, and non-specific headache.  I personally reviewed patient's records and note a neurology office visit on 02/10/2023 for follow-up CVA.  Patient's presentation is most consistent with acute presentation with potential threat to life or bodily function.  Laboratory results demonstrate hemoconcentration with hemoglobin 16.1, elevated creatinine from prior, elevated bilirubin without symptoms.  CT head negative for intracranial  hemorrhage.  Given patient's history of stroke, will obtain MRI brain.  Administer IV fluids, Reglan and Decadron for headache.  Will reassess.  Clinical Course as of 03/13/23 0207  Caleen Essex Mar 13, 2023  0207 Patient resting comfortably in no acute distress.  Updated patient and family members of negative MRI.  Will prescribe Reglan as needed for headache and refer back to her neurologist for follow-up headaches.  Strict return precautions given.  Patient and family members verbalized understanding and agree with plan of care. [JS]    Clinical Course User Index [JS] Irean Hong, MD     FINAL CLINICAL IMPRESSION(S) / ED DIAGNOSES   Final diagnoses:  Other migraine without status migrainosus, not intractable     Rx / DC Orders   ED Discharge Orders     None        Note:  This document was prepared using Dragon voice recognition software and may include unintentional dictation errors.   Irean Hong, MD 03/13/23 0530

## 2023-03-13 ENCOUNTER — Emergency Department: Payer: BC Managed Care – PPO

## 2023-03-13 DIAGNOSIS — G43809 Other migraine, not intractable, without status migrainosus: Secondary | ICD-10-CM | POA: Diagnosis not present

## 2023-03-13 MED ORDER — METOCLOPRAMIDE HCL 10 MG PO TABS: 10.0000 mg | ORAL_TABLET | Freq: Four times a day (QID) | ORAL | 0 refills | Status: AC | PRN

## 2023-03-13 NOTE — Discharge Instructions (Signed)
You may take Reglan as needed for headaches.  Return to the ER for worsening symptoms, persistent vomiting, difficulty breathing or other concerns.

## 2023-03-15 ENCOUNTER — Emergency Department
Admission: EM | Admit: 2023-03-15 | Discharge: 2023-03-15 | Disposition: A | Discharge status: Home / Self Care | Payer: BC Managed Care – PPO | Attending: Emergency Medicine | Admitting: Emergency Medicine

## 2023-03-15 ENCOUNTER — Emergency Department: Payer: BC Managed Care – PPO

## 2023-03-15 ENCOUNTER — Other Ambulatory Visit: Payer: Self-pay

## 2023-03-15 DIAGNOSIS — N183 Chronic kidney disease, stage 3 unspecified: Secondary | ICD-10-CM | POA: Insufficient documentation

## 2023-03-15 DIAGNOSIS — J449 Chronic obstructive pulmonary disease, unspecified: Secondary | ICD-10-CM | POA: Insufficient documentation

## 2023-03-15 DIAGNOSIS — Z1152 Encounter for screening for COVID-19: Secondary | ICD-10-CM | POA: Insufficient documentation

## 2023-03-15 DIAGNOSIS — Z7901 Long term (current) use of anticoagulants: Secondary | ICD-10-CM | POA: Diagnosis not present

## 2023-03-15 DIAGNOSIS — R519 Headache, unspecified: Secondary | ICD-10-CM | POA: Diagnosis present

## 2023-03-15 DIAGNOSIS — R0902 Hypoxemia: Secondary | ICD-10-CM

## 2023-03-15 DIAGNOSIS — Z79899 Other long term (current) drug therapy: Secondary | ICD-10-CM | POA: Insufficient documentation

## 2023-03-15 LAB — CBC WITH DIFFERENTIAL/PLATELET
Abs Immature Granulocytes: 0.07 10*3/uL (ref 0.00–0.07)
Basophils Absolute: 0 10*3/uL (ref 0.0–0.1)
Basophils Relative: 0 %
Eosinophils Absolute: 0 10*3/uL (ref 0.0–0.5)
Eosinophils Relative: 0 %
HCT: 42.6 % (ref 36.0–46.0)
Hemoglobin: 14.2 g/dL (ref 12.0–15.0)
Immature Granulocytes: 1 %
Lymphocytes Relative: 4 %
Lymphs Abs: 0.6 10*3/uL — ABNORMAL LOW (ref 0.7–4.0)
MCH: 31 pg (ref 26.0–34.0)
MCHC: 33.3 g/dL (ref 30.0–36.0)
MCV: 93 fL (ref 80.0–100.0)
Monocytes Absolute: 1.3 10*3/uL — ABNORMAL HIGH (ref 0.1–1.0)
Monocytes Relative: 9 %
Neutro Abs: 12.8 10*3/uL — ABNORMAL HIGH (ref 1.7–7.7)
Neutrophils Relative %: 86 %
Platelets: 233 10*3/uL (ref 150–400)
RBC: 4.58 MIL/uL (ref 3.87–5.11)
RDW: 14.3 % (ref 11.5–15.5)
WBC: 14.8 10*3/uL — ABNORMAL HIGH (ref 4.0–10.5)
nRBC: 0 % (ref 0.0–0.2)

## 2023-03-15 LAB — URINE DRUG SCREEN, QUALITATIVE (ARMC ONLY)
Amphetamines, Ur Screen: NOT DETECTED
Barbiturates, Ur Screen: NOT DETECTED
Benzodiazepine, Ur Scrn: NOT DETECTED
Cannabinoid 50 Ng, Ur ~~LOC~~: NOT DETECTED
Cocaine Metabolite,Ur ~~LOC~~: NOT DETECTED
MDMA (Ecstasy)Ur Screen: NOT DETECTED
Methadone Scn, Ur: POSITIVE — AB
Opiate, Ur Screen: NOT DETECTED
Phencyclidine (PCP) Ur S: NOT DETECTED
Tricyclic, Ur Screen: NOT DETECTED

## 2023-03-15 LAB — BLOOD GAS, ARTERIAL
Acid-Base Excess: 4.5 mmol/L — ABNORMAL HIGH (ref 0.0–2.0)
Bicarbonate: 28.4 mmol/L — ABNORMAL HIGH (ref 20.0–28.0)
O2 Content: 4 L/min
O2 Saturation: 93.5 %
Patient temperature: 37
pCO2 arterial: 39 mmHg (ref 32–48)
pH, Arterial: 7.47 — ABNORMAL HIGH (ref 7.35–7.45)
pO2, Arterial: 60 mmHg — ABNORMAL LOW (ref 83–108)

## 2023-03-15 LAB — COMPREHENSIVE METABOLIC PANEL
ALT: 19 U/L (ref 0–44)
AST: 21 U/L (ref 15–41)
Albumin: 4.4 g/dL (ref 3.5–5.0)
Alkaline Phosphatase: 89 U/L (ref 38–126)
Anion gap: 10 (ref 5–15)
BUN: 16 mg/dL (ref 8–23)
CO2: 29 mmol/L (ref 22–32)
Calcium: 9 mg/dL (ref 8.9–10.3)
Chloride: 98 mmol/L (ref 98–111)
Creatinine, Ser: 1.06 mg/dL — ABNORMAL HIGH (ref 0.44–1.00)
GFR, Estimated: 58 mL/min — ABNORMAL LOW (ref >60)
Glucose, Bld: 153 mg/dL — ABNORMAL HIGH (ref 70–99)
Potassium: 3.7 mmol/L (ref 3.5–5.1)
Sodium: 137 mmol/L (ref 135–145)
Total Bilirubin: 2.5 mg/dL — ABNORMAL HIGH (ref 0.3–1.2)
Total Protein: 7.8 g/dL (ref 6.5–8.1)

## 2023-03-15 LAB — TROPONIN I (HIGH SENSITIVITY)
Troponin I (High Sensitivity): 8 ng/L (ref <18)
Troponin I (High Sensitivity): 8 ng/L (ref <18)

## 2023-03-15 LAB — URINALYSIS, ROUTINE W REFLEX MICROSCOPIC
Bilirubin Urine: NEGATIVE
Glucose, UA: NEGATIVE mg/dL
Hgb urine dipstick: NEGATIVE
Ketones, ur: NEGATIVE mg/dL
Leukocytes,Ua: NEGATIVE
Nitrite: NEGATIVE
Protein, ur: NEGATIVE mg/dL
Specific Gravity, Urine: 1.006 (ref 1.005–1.030)
pH: 7 (ref 5.0–8.0)

## 2023-03-15 LAB — AMMONIA: Ammonia: <10 umol/L (ref 9–35)

## 2023-03-15 LAB — SARS CORONAVIRUS 2 BY RT PCR: SARS Coronavirus 2 by RT PCR: NEGATIVE

## 2023-03-15 LAB — LACTIC ACID, PLASMA: Lactic Acid, Venous: 1.9 mmol/L (ref 0.5–1.9)

## 2023-03-15 MED ORDER — PREDNISONE 10 MG PO TABS: 10.0000 mg | ORAL_TABLET | Freq: Every day | ORAL | 0 refills | Status: AC

## 2023-03-15 MED ORDER — IPRATROPIUM-ALBUTEROL 0.5-2.5 (3) MG/3ML IN SOLN
3.0000 mL | Freq: Once | RESPIRATORY_TRACT | Status: AC
  Administered 2023-03-15: 3 mL via RESPIRATORY_TRACT
  Filled 2023-03-15: qty 3

## 2023-03-15 MED ORDER — METHYLPREDNISOLONE SODIUM SUCC 125 MG IJ SOLR
125.0000 mg | Freq: Once | INTRAMUSCULAR | Status: AC
  Administered 2023-03-15: 125 mg via INTRAVENOUS
  Filled 2023-03-15: qty 2

## 2023-03-15 NOTE — ED Provider Notes (Signed)
-----------------------------------------   9:49 AM on 03/15/2023 ----------------------------------------- Patient's repeat troponin remains negative.  Patient states she is feeling much better and she is ready to go home.  Given the patient's otherwise reassuring workup we will discharge the patient home with PCP follow-up.  Patient agreeable to plan of care.   Minna Antis, MD 03/15/23 [phone]

## 2023-03-15 NOTE — ED Triage Notes (Signed)
BIB medic for headache 8/10 and confusion reported by family since 2200.

## 2023-03-15 NOTE — ED Provider Notes (Signed)
[facility] Provider Note    Event Date/Time   First MD Initiated Contact with Patient 03/15/23 [phone]     (approximate)   History   Headache (BIB medic for headache 8/10 and confusion reported by family since 2200. )   HPI  Natalie [name] is a 67 y.o. female  brought to the ED via EMS from home with a chief complaint of Altered Mental Status.  Family found patient off her oxygen, confused approximately 10pm.  Patient wears 3 L oxygen at baseline.  Patient states she had taken off her oxygen to go to the restroom and evidently forgot to place it back on.  Patient was seen in the ED on Thursday night for frontal headache, head CT head and MRI brain which were both negative and discharged home on Reglan.  Patient still complains of mild frontal headache associated with nausea, no vomiting.  Denies fever/chills, chest pain, abdominal pain, vomiting or diarrhea.     Past Medical History   Past Medical History:  Diagnosis Date   Cataracts, bilateral    Chickenpox cataracts   COPD (chronic obstructive pulmonary disease) (HCC)    Depression    Drug addiction (HCC)    Headache    Hepatitis C virus infection    Hepatitis C without hepatic coma    History of chicken pox    History of colon polyps    Seizures (HCC)    Stroke [facility])      Active Problem List   Patient Active Problem List   Diagnosis Date Noted   CAP (community acquired pneumonia) 11/12/2022   History of CVA (cerebrovascular accident) 11/12/2022   Methadone maintenance therapy patient (HCC) 11/12/2022   Acute metabolic encephalopathy 11/12/2022   Hypertensive emergency 11/12/2022   Swelling of limb 11/26/2021   Frontal lobe and executive function deficit    Hypoalbuminemia due to protein-calorie malnutrition (HCC)    Chronic pain syndrome    Dysphagia, post-stroke    Acute ischemic left MCA stroke (HCC) 10/11/2020   Aphasia due to acute cerebrovascular accident (CVA) (HCC) 10/11/2020    Cerebral thrombosis with cerebral infarction 10/10/2020   Malnutrition of moderate degree 10/01/2020   Pressure injury of skin 09/30/2020   Encephalopathy acute 09/30/2020   Altered mental status    Acute respiratory failure with hypoxia (HCC)    Encephalopathy 09/28/2020   Status epilepticus (HCC)    NSTEMI (non-ST elevated myocardial infarction) (HCC) 07/24/2017   Cataracts, bilateral 03/30/2017   COPD (chronic obstructive pulmonary disease) (HCC) 03/30/2017   Depression 03/30/2017   Drug addiction (HCC) 03/30/2017   Seizures (HCC) 03/30/2017   Chronic migraine without aura without status migrainosus, not intractable 07/26/2014   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   CKD (chronic kidney disease) stage 3, GFR 30-59 ml/min (HCC) 08/04/2013   Edema 03/24/2011   Tobacco use disorder 03/24/2011   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   Migraine headache 01/10/2011     Past Surgical History   Past Surgical History:  Procedure Laterality Date   APPENDECTOMY     CHOLECYSTECTOMY     COLONOSCOPY WITH PROPOFOL N/A 04/30/2015   Procedure: COLONOSCOPY WITH PROPOFOL;  Surgeon: Elnita Maxwell, MD;  Location: [facility] ENDOSCOPY;  Service: Endoscopy;  Laterality: N/A;   COLONOSCOPY WITH PROPOFOL N/A 07/13/2017   Procedure: COLONOSCOPY WITH PROPOFOL;  Surgeon: Scot Jun, MD;  Location: [facility] ENDOSCOPY;  Service: Endoscopy;  Laterality: N/A;   ESOPHAGOGASTRODUODENOSCOPY (EGD) WITH PROPOFOL N/A 07/13/2017  Procedure: ESOPHAGOGASTRODUODENOSCOPY (EGD) WITH PROPOFOL;  Surgeon: Scot Jun, MD;  Location: [facility] ENDOSCOPY;  Service: Endoscopy;  Laterality: N/A;   EYE SURGERY     LOWER EXTREMITY ANGIOGRAPHY Right 01/01/2022   Procedure: Lower Extremity Angiography;  Surgeon: Annice Needy, MD;  Location: [facility] INVASIVE CV LAB;  Service: Cardiovascular;  Laterality: Right;     Home Medications   Prior to Admission medications   Medication Sig Start Date End Date  Taking? Authorizing Provider  amLODipine (NORVASC) 5 MG tablet Take 5 mg by mouth daily. 09/06/21   [provider]  atorvastatin (LIPITOR) 40 MG tablet Take 40 mg by mouth daily.    [provider]  clopidogrel (PLAVIX) 75 MG tablet Take 1 tablet (75 mg total) by mouth daily. 10/19/20   Love, Evlyn Kanner, PA-C  ipratropium-albuterol (DUONEB) 0.5-2.5 (3) MG/3ML SOLN Take by nebulization. Patient not taking: Reported on 02/10/2023 12/27/21   [provider]  losartan (COZAAR) 25 MG tablet Take 25 mg by mouth daily. 04/08/22 04/08/23  [provider]  methadone (DOLOPHINE) 1 mg/ml oral solution Take 47 mg by mouth daily.    [provider]  metoCLOPramide (REGLAN) 10 MG tablet Take 1 tablet (10 mg total) by mouth every 6 (six) hours as needed for nausea or vomiting (headache). 03/13/23   Irean Hong, MD     Allergies  Sulfasalazine, Aspirin, Darvon [propoxyphene], Iodine, Penicillins, Sulfa antibiotics, and Zithromax [azithromycin]   Family History   Family History  Problem Relation Age of Onset   Breast cancer Paternal Grandmother      Physical Exam  Triage Vital Signs: ED Triage Vitals [03/15/23 0315]  Encounter Vitals Group     BP      Systolic BP Percentile      Diastolic BP Percentile      Pulse      Resp      Temp      Temp src      SpO2 93 %     Weight      Height      Head Circumference      Peak Flow      Pain Score      Pain Loc      Pain Education      Exclude from Growth Chart     Updated Vital Signs: BP (!) 141/71 (BP Location: Left Arm)   Pulse 82   Temp 98.4 F (36.9 C) (Oral)   Ht 5\' 5"  (1.651 m)   Wt 63.5 kg   SpO2 91%   BMI 23.30 kg/m    General: Awake, mild distress.  CV:  RRR.  Good peripheral perfusion.  Resp:  Normal effort.  Diminished, otherwise CTAB. Abd:  Nontender.  No distention.  Other:  PERRL.  EOMI.  No carotid bruits.  Supple neck without meningismus.  Alert and oriented to person and place.   CN II-XII grossly intact.  5/5 motor strength and sensation all extremities.  MAE x 4. No petechiae.   ED Results / Procedures / Treatments  Labs (all labs ordered are listed, but only abnormal results are displayed) Labs Reviewed  CBC WITH DIFFERENTIAL/PLATELET - Abnormal; Notable for the following components:      Result Value   WBC 14.8 (*)    Neutro Abs 12.8 (*)    Lymphs Abs 0.6 (*)    Monocytes Absolute 1.3 (*)    All other components within normal limits  URINALYSIS, ROUTINE W REFLEX MICROSCOPIC -  Abnormal; Notable for the following components:   Color, Urine STRAW (*)    APPearance CLEAR (*)    All other components within normal limits  URINE DRUG SCREEN, QUALITATIVE ([facility] ONLY) - Abnormal; Notable for the following components:   Methadone Scn, Ur POSITIVE (*)    All other components within normal limits  BLOOD GAS, ARTERIAL - Abnormal; Notable for the following components:   pH, Arterial 7.47 (*)    pO2, Arterial 60 (*)    Bicarbonate 28.4 (*)    Acid-Base Excess 4.5 (*)    All other components within normal limits  SARS CORONAVIRUS 2 BY RT PCR  LACTIC ACID, PLASMA  COMPREHENSIVE METABOLIC PANEL  AMMONIA  LACTIC ACID, PLASMA  TROPONIN I (HIGH SENSITIVITY)     EKG  ED ECG REPORT I, Elion Hocker J, the attending physician, personally viewed and interpreted this ECG.   Date: 03/15/2023  EKG Time: 0331  Rate: 76  Rhythm: normal sinus rhythm  Axis: Normal  Intervals:none  ST&T Change: Nonspecific    RADIOLOGY I have independently visualized and interpreted patient's x-ray and am awaiting the radiology interpretation:  Chest x-ray (wet read): Bibasilar opacities  Official radiology report(s): No results found.   PROCEDURES:  Critical Care performed: No  .1-3 Lead EKG Interpretation  Performed by: Irean Hong, MD Authorized by: Irean Hong, MD     Interpretation: normal     ECG rate:  80   ECG rate assessment: normal     Rhythm: sinus rhythm      Ectopy: none     Conduction: normal   Comments:     Patient placed on cardiac monitor to evaluate for arrhythmias    MEDICATIONS ORDERED IN ED: Medications  ipratropium-albuterol (DUONEB) 0.5-2.5 (3) MG/3ML nebulizer solution 3 mL (3 mLs Nebulization Given 03/15/23 0359)  methylPREDNISolone sodium succinate (SOLU-MEDROL) 125 mg/2 mL injection 125 mg (125 mg Intravenous Given 03/15/23 0618)     IMPRESSION / MDM / ASSESSMENT AND PLAN / ED COURSE  I reviewed the triage vital signs and the nursing notes.                             67 year old female brought to the ED for altered mental status/confusion, mild frontal headache for several days, found to be off her chronic 3 L oxygen. Differential diagnosis includes, but is not limited to, alcohol, illicit or prescription medications, or other toxic ingestion; intracranial pathology such as stroke or intracerebral hemorrhage; fever or infectious causes including sepsis; hypoxemia and/or hypercarbia; uremia; trauma; endocrine related disorders such as diabetes, hypoglycemia, and thyroid-related diseases; hypertensive encephalopathy; etc. I have personally reviewed patient's records and reviewed her ED visit from 03/12/2023 including her negative CT head and MRI brain.  Prior to that patient had a neurology office visit on 02/10/2023 for follow-up CVA.  Patient's presentation is most consistent with acute presentation with potential threat to life or bodily function.  The patient is on the cardiac monitor to evaluate for evidence of arrhythmia and/or significant heart rate changes.  Will obtain cardiac panel, ABG, chest x-ray.  Administer 125 mg IV Solu-Medrol, DuoNeb.  Will reassess.  Clinical Course as of 03/15/23 0654  Wynelle Link Mar 15, 2023  4098 Delay due to patient difficult stick requiring IV team for access. [JS]  0630 Family at bedside.  IV team able to draw blood and placed IV finally.  Saturations on nasal cannula oxygen now 96% after DuoNeb.  Patient feeling better and appears perkier. [JS]  [phone] Patient has pending rest of lab work and formal chest x-ray read by radiology.  Care will be transferred to the oncoming provider at change of shift.  [JS]    Clinical Course User Index [JS] Irean Hong, MD     FINAL CLINICAL IMPRESSION(S) / ED DIAGNOSES   Final diagnoses:  Hypoxia  Chronic obstructive pulmonary disease, unspecified COPD type (HCC)     Rx / DC Orders   ED Discharge Orders     None        Note:  This document was prepared using Dragon voice recognition software and may include unintentional dictation errors.   Irean Hong, MD 03/15/23 [phone]

## 2023-03-15 NOTE — ED Notes (Signed)
Pt discharged by previous Nurse, Tiffany RN.

## 2023-03-16 ENCOUNTER — Ambulatory Visit: Payer: Self-pay | Admitting: *Deleted

## 2023-03-16 NOTE — Group Note (Deleted)

## 2023-03-16 NOTE — Telephone Encounter (Signed)
Message from [place] M sent at 03/16/2023 11:40 AM EDT  Summary: Pt son concerned about the medication and how it should be taken.   Pt son [name] stated pt was seen at another office and the medication that she was prescribed was held. [name] is concerned about pt medication and requests call back to discuss the medication and how it should be taken. Cb# [phone]          Call History  Contact Date/Time Type Contact Phone/Fax User  03/16/2023 11:36 AM EDT Phone (Incoming) [facility] (Emergency Contact) [phone] Rexene Edison) Marylen Ponto   Reason for Disposition  [1] Caller has URGENT medicine question about med that PCP or specialist prescribed AND [2] triager unable to answer question    Call your PCP for further instructions.  Answer Assessment - Initial Assessment Questions 1. NAME of MEDICINE: "What medicine(s) are you calling about?"     I go to a clinic in [place].  I went there this morning.    They wanted to count my medicines like they always do when I go.     I left them there.   I don't have a ride to go back there and pick them up.  That's the problem.  I don't have transportation. She was in the ED yesterday and was prescribed a medicine in the ED.       When they sent her home from the ED they gave her an rx to pick up from the [facility].  She got the medicine that was prescribed but it got left at the clinic this morning.   When she goes to that clinic they count the medications.   The clinic kept her medications this morning.      She doesn't know what to do.   She is going back to the clinic in the morning.  She doesn't have transportation back to the clinic today to get her pills that were left.   She is wanting to know when should  she take the medication?   I left my medicines at the clinic.   I don't have transportation.   I'm not sure what to do?     Is it ok to wait until in the morning when I go back to the clinic to take it then?  2. QUESTION: "What is your  question?" (e.g., double dose of medicine, side effect)     I didn't know what to do since I left my medicines at the clinic this morning.   Can I wait until tomorrow morning when I go back to the clinic to take the medicine? I asked if she had the name of the medicine and the dose.   She said she did.   There was another lady that got on the phone with [place] (speaker phone) assisting with the call/medications.   I suggested they contact her PCP and let him know the name and dose of the medication and have him advise her what to do as an RN I could not tell her if she should or should not be taking medications.    That would need to come from a provider.   She was agreeable to this plan and will contact her PCP for further instructions. 3. PRESCRIBER: "Who prescribed the medicine?" Reason: if prescribed by specialist, call should be referred to that group.     Provider in the ED at [facility]. 4. SYMPTOMS: "Do you have any symptoms?" If Yes, ask: "What symptoms  are you having?"  "How bad are the symptoms (e.g., mild, moderate, severe)     N/A 5. PREGNANCY:  "Is there any chance that you are pregnant?" "When was your last menstrual period?"     N/A  Protocols used: Medication Question Call-A-AH  Chief Complaint: Left her medications at a clinic she was at this morning.   Seeking directions on what to do since she doesn't have her medications and doesn't have transportation to go back and get them.  Prescribed something in the ED yesterday and wanted to know if she could wait until in the morning to take it when she goes back to the clinic.   Is it ok to skip a dose tonight? I directed her to her PCP for further directions. Symptoms: Left her medications at a clinic she went to this morning.   Will get her medications back in the morning when she returns to the clinic.  (Not sure what kind of clinic she is referring to). Frequency: N/A Pertinent Negatives: Patient denies  N/A Disposition: [] ED /[] Urgent Care (no appt availability in office) / [] Appointment(In office/virtual)/ []   Virtual Care/ [] Home Care/ [] Refused Recommended Disposition /[]  Mobile Bus/ [x]  Follow-up with PCP Additional Notes: Referred pt to her PCP for instructions on what to do regarding her medications and what she can and cannot skip until she gets her medications back in the morning when she returns to the clinic.

## 2023-05-19 ENCOUNTER — Ambulatory Visit
Admission: EM | Admit: 2023-05-19 | Discharge: 2023-05-19 | Disposition: A | Discharge status: Home / Self Care | Payer: BC Managed Care – PPO

## 2023-05-19 DIAGNOSIS — T7840XA Allergy, unspecified, initial encounter: Secondary | ICD-10-CM

## 2023-05-19 DIAGNOSIS — R2231 Localized swelling, mass and lump, right upper limb: Secondary | ICD-10-CM | POA: Diagnosis not present

## 2023-05-19 MED ORDER — HYDROCORTISONE 1 % EX CREA: TOPICAL_CREAM | CUTANEOUS | 0 refills | Status: AC

## 2023-05-19 NOTE — ED Provider Notes (Signed)
[facility]    CSN: [id number] Arrival date & time: 05/19/23  1348      History   Chief Complaint Chief Complaint  Patient presents with   Hand Problem    HPI Natalie [name] is a 67 y.o. female.   67 year old female, Natalie [name], presents to urgent care with son for evaluation of right dorsal aspect of hand edema x 1 day.  Patient states she was working in the yard and swelling started after that, patient denies trauma or injury, unknown if insect or chemical exposure. +itching and erythema, pt has full ROM.   The history is provided by the patient. No language interpreter was used.    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/12/2022   Acute metabolic encephalopathy 11/12/2022   Hypertensive emergency 11/12/2022   Swelling of limb 11/26/2021   Frontal lobe and executive function deficit    Hypoalbuminemia due to protein-calorie malnutrition (HCC)    Chronic pain syndrome    Dysphagia, post-stroke    Acute ischemic left MCA stroke (HCC) 10/11/2020   Aphasia due to acute cerebrovascular accident (CVA) (HCC) 10/11/2020   Cerebral thrombosis with cerebral infarction 10/10/2020   Malnutrition of moderate degree 10/01/2020   Pressure injury of skin 09/30/2020   Encephalopathy acute 09/30/2020   Altered mental status    Acute respiratory failure with hypoxia (HCC)    Encephalopathy  09/28/2020   Status epilepticus (HCC)    NSTEMI (non-ST elevated myocardial infarction) (HCC) 07/24/2017   Cataracts, bilateral 03/30/2017   COPD (chronic obstructive pulmonary disease) (HCC) 03/30/2017   Depression 03/30/2017   Drug addiction (HCC) 03/30/2017   Seizures (HCC) 03/30/2017   Chronic migraine without aura without status migrainosus, not intractable 07/26/2014   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   CKD (chronic kidney disease) stage 3, GFR 30-59 ml/min (HCC) 08/04/2013   Edema 03/24/2011   Tobacco use disorder 03/24/2011   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   Migraine headache 01/10/2011    Past Surgical History:  Procedure Laterality Date   APPENDECTOMY     CHOLECYSTECTOMY     COLONOSCOPY WITH PROPOFOL N/A 04/30/2015   Procedure: COLONOSCOPY WITH PROPOFOL;  Surgeon: Elnita Maxwell, MD;  Location: [facility] ENDOSCOPY;  Service: Endoscopy;  Laterality: N/A;   COLONOSCOPY WITH PROPOFOL N/A 07/13/2017   Procedure: COLONOSCOPY WITH PROPOFOL;  Surgeon: Scot Jun, MD;  Location: [facility] ENDOSCOPY;  Service: Endoscopy;  Laterality: N/A;   ESOPHAGOGASTRODUODENOSCOPY (EGD) WITH PROPOFOL N/A 07/13/2017   Procedure: ESOPHAGOGASTRODUODENOSCOPY (EGD) WITH PROPOFOL;  Surgeon: Scot Jun, MD;  Location[facility] ENDOSCOPY;  Service: Endoscopy;  Laterality: N/A;   EYE SURGERY     LOWER EXTREMITY ANGIOGRAPHY Right 01/01/2022   Procedure: Lower Extremity Angiography;  Surgeon: Annice Needy, MD;  Location: [facility] INVASIVE CV LAB;  Service: Cardiovascular;  Laterality: Right;    OB History   No obstetric history on file.      Home Medications    Prior to Admission medications   Medication Sig Start Date End Date Taking? Authorizing Provider  albuterol (VENTOLIN HFA) 108 (90 Base) MCG/ACT inhaler SMARTSIG:2 Inhalation Via Inhaler Every 6 Hours PRN   Yes [provider]  amLODipine (NORVASC) 5 MG tablet Take 5 mg by mouth daily. 09/06/21  Yes  [provider]  atorvastatin (LIPITOR) 40 MG tablet Take 40 mg by mouth daily.   Yes [provider]  clopidogrel (PLAVIX) 75 MG tablet Take 1 tablet (75 mg total) by mouth daily. 10/19/20  Yes Love, Evlyn Kanner, PA-C  hydrocortisone cream 1 % Apply to affected area 2 times daily(right hand) 05/19/23  Yes Deontay Ladnier, Para March, NP  losartan (COZAAR) 25 MG tablet Take 25 mg by mouth daily. 04/08/22 05/19/23 Yes [provider]  methadone (DOLOPHINE) 1 mg/ml oral solution Take 47 mg by mouth daily.   Yes [provider]  metoCLOPramide (REGLAN) 10 MG tablet Take 1 tablet (10 mg total) by mouth every 6 (six) hours as needed for nausea or vomiting (headache). 03/13/23  Yes Irean Hong, MD  predniSONE (DELTASONE) 10 MG tablet Take 1 tablet (10 mg total) by mouth daily. Day 1-3: take 4 tablets PO daily Day 4-6: take 3 tablets PO daily Day 7-9: take 2 tablets PO daily Day 10-12: take 1 tablet PO daily 03/15/23  Yes Minna Antis, MD  ipratropium-albuterol (DUONEB) 0.5-2.5 (3) MG/3ML SOLN Take by nebulization. Patient not taking: Reported on 02/10/2023 12/27/21   [provider]    Family History Family History  Problem Relation Age of Onset   Breast cancer Paternal Grandmother     Social History Social History   Tobacco Use   Smoking status: Former    Current packs/day: 0.00    Average packs/day: 0.5 packs/day for 41.0 years (20.5 ttl pk-yrs)    Types: Cigarettes    Start date: 10/27/1974    Quit date: 10/27/2015    Years since quitting: 7.5   Smokeless tobacco: Never  Vaping Use   Vaping status: Never Used  Substance Use Topics   Alcohol use: No   Drug use: No     Allergies   Sulfasalazine, Aspirin, Darvon [propoxyphene], Iodine, Penicillins, Sulfa antibiotics, and Zithromax [azithromycin]   Review of Systems Review of Systems  Constitutional:  Negative for fever.  Skin:  Positive for color change. Negative for rash and wound.  All other  systems reviewed and are negative.    Physical Exam Triage Vital Signs ED Triage Vitals  Encounter Vitals Group     BP 05/19/23 1456 128/72     Systolic BP Percentile --      Diastolic BP Percentile --      Pulse Rate 05/19/23 1456 62     Resp 05/19/23 1456 16     Temp 05/19/23 1456 98 F (36.7 C)     Temp Source 05/19/23 1456 Oral     SpO2 05/19/23 1456 97 %     Weight 05/19/23 1455 139 lb (63 kg)     Height 05/19/23 1455 5\' 4"  (1.626 m)     Head Circumference --      Peak Flow --      Pain Score 05/19/23 1459 3     Pain Loc --      Pain Education --      Exclude from Growth Chart --  No data found.  Updated Vital Signs BP 128/72 (BP Location: Left Arm)   Pulse 62   Temp 98 F (36.7 C) (Oral)   Resp 16   Ht 5\' 4"  (1.626 m)   Wt 139 lb (63 kg)   SpO2 97%   BMI 23.86 kg/m   Visual Acuity Right Eye Distance:   Left Eye Distance:   Bilateral Distance:    Right Eye Near:   Left Eye Near:    Bilateral Near:     Physical Exam Vitals and nursing note reviewed.  Skin:    General: Skin is warm.     Capillary Refill: Capillary refill takes less than 2 seconds.     Findings: Erythema present. No signs of injury, rash or wound.     Comments: Dorsal aspect of righ thand, + erythema/swelling, full ROM  Neurological:     General: No focal deficit present.     Mental Status: She is alert and oriented to person, place, and time.     GCS: GCS eye subscore is 4. GCS verbal subscore is 5. GCS motor subscore is 6.     Cranial Nerves: No cranial nerve deficit.     Sensory: No sensory deficit.     Motor: Motor function is intact.  Psychiatric:        Attention and Perception: Attention normal.        Mood and Affect: Mood normal.        Speech: Speech normal.        Behavior: Behavior normal.      UC Treatments / Results  Labs (all labs ordered are listed, but only abnormal results are displayed) Labs Reviewed - No data to display  EKG   Radiology No  results found.  Procedures Procedures (including critical care time)  Medications Ordered in UC Medications - No data to display  Initial Impression / Assessment and Plan / UC Course  I have reviewed the triage vital signs and the nursing notes.  Pertinent labs & imaging results that were available during my care of the patient were reviewed by me and considered in my medical decision making (see chart for details).    Discussed exam findings and plan of care with patient, strict go to ER precautions given.   Patient verbalized understanding to this provider.  Ddx: Right hand swelling, dermatitis, insect bite, allergy Final Clinical Impressions(s) / UC Diagnoses   Final diagnoses:  Localized swelling on right hand  Allergy, initial encounter     Discharge Instructions      Use hydrocortisone as prescribed. Make sure to avoid early morning,late evening as insect activity is at peak. Use bug spray when outside, long sleeves/pants, may take zyrtec or benadryl as prescribed. Wash hands frequently, trim nails,avoid scratching. If you develop fever, streaking, worsening issues please follow up immediately with PCP or go to ER.     ED Prescriptions     Medication Sig Dispense Auth. Provider   hydrocortisone cream 1 % Apply to affected area 2 times daily(right hand) 15 g Natia Fahmy, Para March, NP      PDMP not reviewed this encounter.   Clancy Gourd, NP 05/19/23 2107

## 2023-05-19 NOTE — Discharge Instructions (Addendum)
Use hydrocortisone as prescribed. Make sure to avoid early morning,late evening as insect activity is at peak. Use bug spray when outside, long sleeves/pants, may take zyrtec or benadryl as prescribed. Wash hands frequently, trim nails,avoid scratching. If you develop fever, streaking, worsening issues please follow up immediately with PCP or go to ER.

## 2023-05-19 NOTE — ED Triage Notes (Signed)
Pt c/o R hand edema x1 day. States she was working in the yard & started started after. Currently on 3 L of O2.

## 2023-10-09 ENCOUNTER — Other Ambulatory Visit: Payer: Self-pay | Admitting: Family Medicine

## 2023-10-09 DIAGNOSIS — Z780 Asymptomatic menopausal state: Secondary | ICD-10-CM

## 2023-10-09 DIAGNOSIS — Z1231 Encounter for screening mammogram for malignant neoplasm of breast: Secondary | ICD-10-CM

## 2023-11-02 ENCOUNTER — Other Ambulatory Visit (INDEPENDENT_AMBULATORY_CARE_PROVIDER_SITE_OTHER): Payer: Self-pay | Admitting: Nurse Practitioner

## 2023-11-02 DIAGNOSIS — I70221 Atherosclerosis of native arteries of extremities with rest pain, right leg: Secondary | ICD-10-CM

## 2023-11-04 ENCOUNTER — Telehealth (INDEPENDENT_AMBULATORY_CARE_PROVIDER_SITE_OTHER): Payer: Self-pay

## 2023-11-04 NOTE — Telephone Encounter (Signed)
 Patient son left a message requesting to reschedule his mother appointment for tomorrow at 1pm.  Patient is not feeling well. Please contact patient son to rescheduled appointment.

## 2023-11-05 ENCOUNTER — Ambulatory Visit (INDEPENDENT_AMBULATORY_CARE_PROVIDER_SITE_OTHER): Payer: Medicare HMO | Admitting: Nurse Practitioner

## 2023-11-05 ENCOUNTER — Encounter (INDEPENDENT_AMBULATORY_CARE_PROVIDER_SITE_OTHER): Payer: Medicare HMO

## 2023-11-24 ENCOUNTER — Encounter (INDEPENDENT_AMBULATORY_CARE_PROVIDER_SITE_OTHER): Payer: Self-pay

## 2023-12-22 ENCOUNTER — Encounter (INDEPENDENT_AMBULATORY_CARE_PROVIDER_SITE_OTHER)

## 2023-12-22 ENCOUNTER — Ambulatory Visit (INDEPENDENT_AMBULATORY_CARE_PROVIDER_SITE_OTHER): Admitting: Vascular Surgery

## 2024-01-11 ENCOUNTER — Ambulatory Visit (INDEPENDENT_AMBULATORY_CARE_PROVIDER_SITE_OTHER)

## 2024-01-11 DIAGNOSIS — I70221 Atherosclerosis of native arteries of extremities with rest pain, right leg: Secondary | ICD-10-CM

## 2024-01-11 LAB — VAS US ABI WITH/WO TBI
Left ABI: 1.1
Right ABI: 1.14

## 2024-01-18 ENCOUNTER — Ambulatory Visit (INDEPENDENT_AMBULATORY_CARE_PROVIDER_SITE_OTHER): Admitting: Vascular Surgery

## 2024-01-18 ENCOUNTER — Encounter (INDEPENDENT_AMBULATORY_CARE_PROVIDER_SITE_OTHER): Payer: Self-pay | Admitting: Vascular Surgery

## 2024-01-18 VITALS — BP 114/71 | HR 84 | Resp 16 | Ht 61.0 in | Wt 149.0 lb

## 2024-01-18 DIAGNOSIS — N1831 Chronic kidney disease, stage 3a: Secondary | ICD-10-CM

## 2024-01-18 DIAGNOSIS — M7989 Other specified soft tissue disorders: Secondary | ICD-10-CM

## 2024-01-18 DIAGNOSIS — I70221 Atherosclerosis of native arteries of extremities with rest pain, right leg: Secondary | ICD-10-CM

## 2024-01-21 ENCOUNTER — Other Ambulatory Visit: Payer: Self-pay

## 2024-01-21 ENCOUNTER — Emergency Department

## 2024-01-21 ENCOUNTER — Emergency Department
Admission: EM | Admit: 2024-01-21 | Discharge: 2024-01-21 | Disposition: A | Discharge status: Home / Self Care | Attending: Emergency Medicine | Admitting: Emergency Medicine

## 2024-01-21 DIAGNOSIS — S4991XA Unspecified injury of right shoulder and upper arm, initial encounter: Secondary | ICD-10-CM | POA: Diagnosis present

## 2024-01-21 DIAGNOSIS — N189 Chronic kidney disease, unspecified: Secondary | ICD-10-CM | POA: Insufficient documentation

## 2024-01-21 DIAGNOSIS — S40011A Contusion of right shoulder, initial encounter: Secondary | ICD-10-CM | POA: Insufficient documentation

## 2024-01-21 DIAGNOSIS — J449 Chronic obstructive pulmonary disease, unspecified: Secondary | ICD-10-CM | POA: Insufficient documentation

## 2024-01-21 DIAGNOSIS — W108XXA Fall (on) (from) other stairs and steps, initial encounter: Secondary | ICD-10-CM | POA: Diagnosis not present

## 2024-01-21 MED ORDER — HYDROCODONE-ACETAMINOPHEN 5-325 MG PO TABS
1.0000 | ORAL_TABLET | Freq: Once | ORAL | Status: AC
  Administered 2024-01-21: 1 via ORAL
  Filled 2024-01-21: qty 1

## 2024-01-21 MED ORDER — HYDROCODONE-ACETAMINOPHEN 5-325 MG PO TABS
1.0000 | ORAL_TABLET | Freq: Four times a day (QID) | ORAL | 0 refills | Status: AC | PRN
Start: 2024-01-21 — End: 2025-01-20

## 2024-01-21 NOTE — Discharge Instructions (Addendum)
 I to your shoulder as needed for discomfort and to help with any swelling.  Wear the shoulder immobilizer/sling for approximately 5 to 7 days.  To keep your shoulder from getting completely stiff try to move your shoulder a little bit each day.  A prescription for hydrocodone  was sent to the pharmacy for you to take every 6 hours as needed for pain.  Follow-up with your primary care provider if any continued problems or concerns.

## 2024-01-21 NOTE — ED Notes (Signed)
 See triage note  Presents s/p fall  States she was going down the steps  Clemens down about 3 steps  Hitting right shoulder Denies hitting her head

## 2024-01-21 NOTE — ED Provider Notes (Signed)
 [facility] [place] Provider Note    None    (approximate)   History   Fall   HPI  Natalie [name] is a 68 y.o. female   presents to the ED with complaint of right shoulder pain.  Patient states that she fell down 8 steps this morning while letting her 2 dogs out.  Patient denies any head injury or loss of consciousness.  Patient has history of non-STEMI, CKD, COPD, hepatitis C, migraine headache, seizures, encephalopathy, ischemic CVA, chronic pain syndrome, methadone  maintenance therapy, depression.      Physical Exam   Triage Vital Signs: ED Triage Vitals  Encounter Vitals Group     BP 01/21/24 0605 113/83     Girls Systolic BP Percentile --      Girls Diastolic BP Percentile --      Boys Systolic BP Percentile --      Boys Diastolic BP Percentile --      Pulse Rate 01/21/24 0605 79     Resp 01/21/24 0605 18     Temp 01/21/24 0605 98.4 F (36.9 C)     Temp src --      SpO2 01/21/24 0605 96 %     Weight 01/21/24 0603 149 lb (67.6 kg)     Height 01/21/24 0603 5' 4 (1.626 m)     Head Circumference --      Peak Flow --      Pain Score 01/21/24 0603 5     Pain Loc --      Pain Education --      Exclude from Growth Chart --     Most recent vital signs: Vitals:   01/21/24 0605  BP: 113/83  Pulse: 79  Resp: 18  Temp: 98.4 F (36.9 C)  SpO2: 96%     General: Awake, no distress.  Alert, talkative, able to answer questions appropriately. CV:  Good peripheral perfusion.   Resp:  Normal effort.  Abd:  No distention.  Other:  Right shoulder without marked deformity however patient has limited range of motion and markedly tender to palpation.  Motor and sensory function intact distal to the injury.  Pulses present.   ED Results / Procedures / Treatments   Labs (all labs ordered are listed, but only abnormal results are displayed) Labs Reviewed - No data to display    RADIOLOGY Left shoulder x-ray images were reviewed and interpreted by  myself and the radiologist with no obvious fracture noted.  Radiology questions possibility of a distal clavicle fracture due to positioning.  Recommended dedicated clavicle films.  Left clavicle x-ray images were reviewed by myself independent of the radiologist and no fracture was noted.  Radiology report is negative.  PROCEDURES:  Critical Care performed:   Procedures   MEDICATIONS ORDERED IN ED: Medications  HYDROcodone -acetaminophen  (NORCO/VICODIN) 5-325 MG per tablet 1 tablet (1 tablet Oral Given 01/21/24 0721)     IMPRESSION / MDM / ASSESSMENT AND PLAN / ED COURSE  I reviewed the triage vital signs and the nursing notes.   Differential diagnosis includes, but is not limited to, right shoulder fracture, dislocation, contusion secondary to fall.  68 year old female presents to the ED after a fall that occurred due to her dogs causing her to lose her balance.  She complains of right shoulder pain.  No other injury was reported.  Patient was made aware that her radiology report was negative for fracture of the shoulder and clavicle.  A sling was applied for  support and protection but patient is aware that she can only wear this for a couple of days to prevent her shoulder from getting stiff and causing more difficulty with movement.  A prescription for hydrocodone  was sent to the pharmacy for her to begin taking as needed.  She is to follow-up with her PCP if any continued problems and return to the emergency department if any severe worsening of her symptoms.      Patient's presentation is most consistent with acute complicated illness / injury requiring diagnostic workup.  FINAL CLINICAL IMPRESSION(S) / ED DIAGNOSES   Final diagnoses:  Contusion of right shoulder, initial encounter     Rx / DC Orders   ED Discharge Orders          Ordered    HYDROcodone -acetaminophen  (NORCO/VICODIN) 5-325 MG tablet  Every 6 hours PRN        01/21/24 0819             Note:  This  document was prepared using Dragon voice recognition software and may include unintentional dictation errors.   Saunders Shona CROME, PA-C 01/22/24 9153    Willo Dunnings, MD 01/22/24 1536

## 2024-01-21 NOTE — ED Triage Notes (Signed)
 Pt reports falling down 8 stairs this morning while lettign her dog out, pt c/o right shoulder pain

## 2024-01-25 ENCOUNTER — Encounter (INDEPENDENT_AMBULATORY_CARE_PROVIDER_SITE_OTHER): Payer: Self-pay | Admitting: Vascular Surgery

## 2024-01-25 NOTE — Progress Notes (Deleted)
 Subjective:    Patient ID: Natalie [name], female    DOB: February 03, 1956, 68 y.o.   MRN: [id number] Chief Complaint  Patient presents with   Follow-up    1 Year follow up ABI    Natalie [name] is a 68 yo female who presents to clinic today with chief complaint of bilateral lower extremity varicose veins.  Patient endorses she has 2 areas to her right upper thigh that are painful but multiple other areas both of her legs that are not.  She endorses that her legs throb most of the time and the throbbing is worse the longer she stands.  She does not describe it as painful or more tightness to her lower extremities which interferes with her ability to do things at home.  She has not tried any conventional therapy at this time.  She has not tried any compression, rest, elevation.  She does endorse that she exercises 3 times a week which may be putting more pressure on her lower extremities.  Especially without using compression.    Review of Systems  Constitutional: Negative.   Cardiovascular:  Positive for leg swelling.  Musculoskeletal:        Bilateral lower extremity throbbing with some leg swelling intermittently with varicose veins.  Skin:  Positive for color change.  All other systems reviewed and are negative.      Objective:   Physical Exam Constitutional:      Appearance: Normal appearance. She is normal weight.  HENT:     Head: Normocephalic.  Eyes:     Pupils: Pupils are equal, round, and reactive to light.  Cardiovascular:     Rate and Rhythm: Normal rate and regular rhythm.     Pulses: Normal pulses.     Heart sounds: Normal heart sounds.  Pulmonary:     Effort: Pulmonary effort is normal.     Breath sounds: Normal breath sounds.  Abdominal:     General: Abdomen is flat. Bowel sounds are normal.     Palpations: Abdomen is soft.  Musculoskeletal:        General: Tenderness present.     Comments: Tenderness to right upper thigh due to varicose veins.  Skin:    General: Skin  is warm and dry.     Capillary Refill: Capillary refill takes 2 to 3 seconds.  Neurological:     General: No focal deficit present.     Mental Status: She is alert and oriented to person, place, and time. Mental status is at baseline.  Psychiatric:        Mood and Affect: Mood normal.        Behavior: Behavior normal.        Thought Content: Thought content normal.        Judgment: Judgment normal.     BP 114/71 (BP Location: Right Arm, Patient Position: Sitting, Cuff Size: Normal)   Pulse 84   Resp 16   Ht 5' 1 (1.549 m)   Wt 149 lb (67.6 kg)   BMI 28.15 kg/m   Past Medical History:  Diagnosis Date   Cataracts, bilateral    Chickenpox cataracts   COPD (chronic obstructive pulmonary disease) (HCC)    Depression    Drug addiction (HCC)    Headache    Hepatitis C virus infection    Hepatitis C without hepatic coma    History of chicken pox    History of colon polyps    Seizures (HCC)    Stroke (  HCC)     Social History   Socioeconomic History   Marital status: Married    Spouse name: Not on file   Number of children: 2   Years of education: GED   Highest education level: Not on file  Occupational History   Not on file  Tobacco Use   Smoking status: Former    Current packs/day: 0.00    Average packs/day: 0.5 packs/day for 41.0 years (20.5 ttl pk-yrs)    Types: Cigarettes    Start date: 10/27/1974    Quit date: 10/27/2015    Years since quitting: 8.2   Smokeless tobacco: Never  Vaping Use   Vaping status: Never Used  Substance and Sexual Activity   Alcohol use: No   Drug use: No   Sexual activity: Not Currently  Other Topics Concern   Not on file  Social History Narrative   03/14/21 son with her now, he lives 20 min away   significant other recently passed away   Social Drivers of Health   Financial Resource Strain: Low Risk  (03/25/2023)   Received from [facility]   Overall Financial Resource Strain (CARDIA)    Difficulty of Paying  Living Expenses: Not hard at all  Food Insecurity: No Food Insecurity (03/25/2023)   Received from [facility]   Hunger Vital Sign    Within the past 12 months, you worried that your food would run out before you got the money to buy more.: Never true    Within the past 12 months, the food you bought just didn't last and you didn't have money to get more.: Never true  Transportation Needs: No Transportation Needs (03/25/2023)   Received from [facility] Transportation    In the past 12 months, has lack of transportation kept you from medical appointments or from getting medications?: No    Lack of Transportation (Non-Medical): No  Physical Activity: Not on file  Stress: Not on file  Social Connections: Not on file  Intimate Partner Violence: Not At Risk (11/13/2022)   Humiliation, Afraid, Rape, and Kick questionnaire    Fear of Current or Ex-Partner: No    Emotionally Abused: No    Physically Abused: No    Sexually Abused: No    Past Surgical History:  Procedure Laterality Date   APPENDECTOMY     CHOLECYSTECTOMY     COLONOSCOPY WITH PROPOFOL  N/A 04/30/2015   Procedure: COLONOSCOPY WITH PROPOFOL ;  Surgeon: Donnice Vaughn Manes, MD;  Location: [facility] ENDOSCOPY;  Service: Endoscopy;  Laterality: N/A;   COLONOSCOPY WITH PROPOFOL  N/A 07/13/2017   Procedure: COLONOSCOPY WITH PROPOFOL ;  Surgeon: Viktoria Lamar DASEN, MD;  Location: [facility] ENDOSCOPY;  Service: Endoscopy;  Laterality: N/A;   ESOPHAGOGASTRODUODENOSCOPY (EGD) WITH PROPOFOL  N/A 07/13/2017   Procedure: ESOPHAGOGASTRODUODENOSCOPY (EGD) WITH PROPOFOL ;  Surgeon: Viktoria Lamar DASEN, MD;  Location: [facility] ENDOSCOPY;  Service: Endoscopy;  Laterality: N/A;   EYE SURGERY     LOWER EXTREMITY ANGIOGRAPHY Right 01/01/2022   Procedure: Lower Extremity Angiography;  Surgeon: Marea Selinda RAMAN, MD;  Location: [facility] INVASIVE CV LAB;  Service: Cardiovascular;  Laterality: Right;    Family History  Problem Relation Age  of Onset   Breast cancer Paternal Grandmother     Allergies  Allergen Reactions   Sulfasalazine Shortness Of Breath   Aspirin     Darvon [Propoxyphene]    Iodine     Penicillins    Sulfa Antibiotics    Zithromax  [Azithromycin ]  Latest Ref Rng & Units 03/15/2023    3:33 AM 03/12/2023    7:49 PM 11/18/2022    6:04 AM  CBC  WBC 4.0 - 10.5 K/uL 14.8  10.7  7.3   Hemoglobin 12.0 - 15.0 g/dL 85.7  83.8  84.9   Hematocrit 36.0 - 46.0 % 42.6  48.7  47.3   Platelets 150 - 400 K/uL 233  195  283       CMP     Component Value Date/Time   NA 137 03/15/2023 0333   NA 142 09/24/2021 1106   NA 137 05/09/2012 0748   K 3.7 03/15/2023 0333   K 2.9 (L) 05/09/2012 0748   CL 98 03/15/2023 0333   CL 99 05/09/2012 0748   CO2 29 03/15/2023 0333   CO2 32 05/09/2012 0748   GLUCOSE 153 (H) 03/15/2023 0333   GLUCOSE 108 (H) 05/09/2012 0748   BUN 16 03/15/2023 0333   BUN 17 09/24/2021 1106   BUN 11 05/09/2012 0748   CREATININE 1.06 (H) 03/15/2023 0333   CREATININE 0.64 05/09/2012 0748   CALCIUM  9.0 03/15/2023 0333   CALCIUM  9.1 05/09/2012 0748   PROT 7.8 03/15/2023 0333   PROT 7.1 09/24/2021 1106   PROT 7.1 05/01/2012 0823   ALBUMIN 4.4 03/15/2023 0333   ALBUMIN 4.8 09/24/2021 1106   ALBUMIN 3.9 05/01/2012 0823   AST 21 03/15/2023 0333   AST 13 (L) 05/01/2012 0823   ALT 19 03/15/2023 0333   ALT 21 05/01/2012 0823   ALKPHOS 89 03/15/2023 0333   ALKPHOS 173 (H) 05/01/2012 0823   BILITOT 2.5 (H) 03/15/2023 0333   BILITOT 1.5 (H) 09/24/2021 1106   BILITOT 1.4 (H) 05/01/2012 0823   EGFR 48 (L) 09/24/2021 1106   GFRNONAA 58 (L) 03/15/2023 0333   GFRNONAA >60 05/09/2012 0748     VAS US  ABI WITH/WO TBI Result Date: 01/11/2024  LOWER EXTREMITY DOPPLER STUDY Patient Name:  NELA BASCOM  Date of Exam:   01/11/2024 Medical Rec #: [id number]    Accession #:    [id number] Date of Birth: 02/04/1956    Patient Gender: F Patient Age:   14 years Exam Location:  [facility] Procedure:       VAS US  ABI WITH/WO TBI Referring Phys: --------------------------------------------------------------------------------  Indications: Peripheral artery disease.  Vascular Interventions: 01/01/2022: Catheter placement into Aorta from Bilateral                         femoral approach. Aortogram and Selective Right Lower                         Extremity Angiogram. PTA of the Aorta and the Right CIA                         with 6 mm diameter Lutonix drug coated balloon to                         predilate the lesions. Stent placement to the Right CIA                         with 8 mm diameter by 37 mm length Lifestream stent.                         Stent placement  to the Aorta with 12 mm diameter by 38                         mm length lifestream stent postdialated with a 14 mm                         balloon. Comparison Study: 11/2022 Performing Technologist: Jerel Croak RVT  Examination Guidelines: A complete evaluation includes at minimum, Doppler waveform signals and systolic blood pressure reading at the level of bilateral brachial, anterior tibial, and posterior tibial arteries, when vessel segments are accessible. Bilateral testing is considered an integral part of a complete examination. Photoelectric Plethysmograph (PPG) waveforms and toe systolic pressure readings are included as required and additional duplex testing as needed. Limited examinations for reoccurring indications may be performed as noted.  ABI Findings: +---------+------------------+-----+---------+--------+ Right    Rt Pressure (mmHg)IndexWaveform Comment  +---------+------------------+-----+---------+--------+ Brachial 119                                      +---------+------------------+-----+---------+--------+ PTA      122               1.01 biphasic          +---------+------------------+-----+---------+--------+ DP       138               1.14 triphasic          +---------+------------------+-----+---------+--------+ Burnetta Griffon               1.14 Normal            +---------+------------------+-----+---------+--------+ +---------+------------------+-----+--------+-------+ Left     Lt Pressure (mmHg)IndexWaveformComment +---------+------------------+-----+--------+-------+ Brachial 121                                    +---------+------------------+-----+--------+-------+ PTA      127               1.05 biphasic        +---------+------------------+-----+--------+-------+ DP       133               1.10 biphasic        +---------+------------------+-----+--------+-------+ Burnetta Griffon               1.14 Normal          +---------+------------------+-----+--------+-------+ +-------+-----------+-----------+------------+------------+ ABI/TBIToday's ABIToday's TBIPrevious ABIPrevious TBI +-------+-----------+-----------+------------+------------+ Right  1.14       1.14       1.07        .84          +-------+-----------+-----------+------------+------------+ Left   1.10       1.14       1.05        1.02         +-------+-----------+-----------+------------+------------+ Bilateral ABIs and TBIs appear essentially unchanged compared to prior study on 11/2022.  Summary: Right: Resting right ankle-brachial index is within normal range. The right toe-brachial index is normal. Left: Resting left ankle-brachial index is within normal range. The left toe-brachial index is normal. *See table(s) above for measurements and observations.  Electronically signed by Selinda Gu MD on 01/11/2024 at 1:02:17 PM.    Final        Assessment & Plan:   1. Varicose veins of  bilateral lower extremities with other complications (Primary) Mild venous stasis changes to the legs bilaterally.  1+ soft pitting edema bilaterally. Two areas of the right upper thigh with painful varicose veins. I recommend we start with conventional therapy for vascular  insufficiency with possible reflux. This includes compression, rest, elevation while resting and exercise.  Patient to return in 3 months for reevaluation of varicose veins.  We will also do at this time venous ultrasounds to rule out DVT and reflux.  If varicose veins in the 2 areas of her right thigh remain painful we can discuss sclerotherapy at that time.  Patient is in agreement with the plan.   2. Stage 3a chronic kidney disease (HCC) Patient counseled to control hypertension and not use any substances that enhance kidney dysfunction. Counseled to adhere to low salt diet. scattered varicosities present bilaterally.  3. Chronic obstructive pulmonary disease, unspecified COPD type (HCC) Continue pulmonary medications and aerosols as already ordered, these medications have been reviewed and there are no changes at this time.    Current Outpatient Medications on File Prior to Visit  Medication Sig Dispense Refill   albuterol  (VENTOLIN  HFA) 108 (90 Base) MCG/ACT inhaler SMARTSIG:2 Inhalation Via Inhaler Every 6 Hours PRN     amLODipine  (NORVASC ) 5 MG tablet Take 5 mg by mouth daily.     atorvastatin  (LIPITOR) 40 MG tablet Take 40 mg by mouth daily.     clopidogrel  (PLAVIX ) 75 MG tablet Take 1 tablet (75 mg total) by mouth daily. 30 tablet 0   hydrocortisone  cream 1 % Apply to affected area 2 times daily(right hand) 15 g 0   losartan  (COZAAR ) 25 MG tablet Take 25 mg by mouth daily.     methadone  (DOLOPHINE ) 1 mg/ml oral solution Take 47 mg by mouth daily.     metoCLOPramide  (REGLAN ) 10 MG tablet Take 1 tablet (10 mg total) by mouth every 6 (six) hours as needed for nausea or vomiting (headache). 20 tablet 0   OXYGEN Inhale 4.5 L into the lungs continuous.     ipratropium-albuterol  (DUONEB) 0.5-2.5 (3) MG/3ML SOLN Take by nebulization. (Patient not taking: Reported on 02/10/2023)     predniSONE  (DELTASONE ) 10 MG tablet Take 1 tablet (10 mg total) by mouth daily. Day 1-3: take 4 tablets PO daily  Day 4-6: take 3 tablets PO daily Day 7-9: take 2 tablets PO daily Day 10-12: take 1 tablet PO daily 30 tablet 0   No current facility-administered medications on file prior to visit.    There are no Patient Instructions on file for this visit. No follow-ups on file.   Gwendlyn JONELLE Shank, NP

## 2024-01-25 NOTE — Progress Notes (Signed)
 Subjective:    Patient ID: Natalie [name], female    DOB: 1956/01/10, 68 y.o.   MRN: [id number] Chief Complaint  Patient presents with   Follow-up    1 Year follow up ABI    Natalie [name] is a 68 yo female who presents to clinic today for annual follow-up of bilateral lower extremities for claudication and pain.  Patient underwent her 1 year ultrasounds with ABIs which were normal today.   Right ABI today is 1.14.  Previous right ABI is 1.07 Left ABI today is 1.10.  Previous left ABI was 1.05.  She is not complaining of any claudication pain either at rest or with exercise.  No arterial difficulties noted.  However patient does present today with bilateral lower extremity lymphedema.  She was previously directed to [facility] after her clinic visit to obtain compression stockings and continue with conventional care.  Patient endorses she has been not been compliant with any conventional measures at this time.  Lower extremities are continually throbbing and aching with muscle cramping.  Erythema and color change noted to bilateral lower extremities.  She endorses that this is keeping her from exercising at this time.    Review of Systems  Constitutional: Negative.   Cardiovascular:  Positive for leg swelling.  Skin:  Positive for color change.  All other systems reviewed and are negative.      Objective:   Physical Exam Vitals reviewed.  Constitutional:      Appearance: Normal appearance.  HENT:     Head: Normocephalic.  Eyes:     Pupils: Pupils are equal, round, and reactive to light.  Cardiovascular:     Rate and Rhythm: Normal rate and regular rhythm.     Pulses: Normal pulses.     Heart sounds: Normal heart sounds.  Pulmonary:     Effort: Pulmonary effort is normal.     Breath sounds: Wheezing present.     Comments: Patient wears portable home oxygen Abdominal:     General: Abdomen is flat. Bowel sounds are normal.     Palpations: Abdomen is soft.   Musculoskeletal:        General: Swelling and tenderness present.     Right lower leg: Edema present.     Left lower leg: Edema present.  Skin:    General: Skin is warm and dry.     Capillary Refill: Capillary refill takes 2 to 3 seconds.  Neurological:     General: No focal deficit present.     Mental Status: She is alert and oriented to person, place, and time. Mental status is at baseline.  Psychiatric:        Mood and Affect: Mood normal.        Behavior: Behavior normal.        Thought Content: Thought content normal.        Judgment: Judgment normal.     BP 114/71 (BP Location: Right Arm, Patient Position: Sitting, Cuff Size: Normal)   Pulse 84   Resp 16   Ht 5' 1 (1.549 m)   Wt 149 lb (67.6 kg)   BMI 28.15 kg/m   Past Medical History:  Diagnosis Date   Cataracts, bilateral    Chickenpox cataracts   COPD (chronic obstructive pulmonary disease) (HCC)    Depression    Drug addiction (HCC)    Headache    Hepatitis C virus infection    Hepatitis C without hepatic coma    History of chicken pox  History of colon polyps    Seizures (HCC)    Stroke [facility])     Social History   Socioeconomic History   Marital status: Married    Spouse name: Not on file   Number of children: 2   Years of education: GED   Highest education level: Not on file  Occupational History   Not on file  Tobacco Use   Smoking status: Former    Current packs/day: 0.00    Average packs/day: 0.5 packs/day for 41.0 years (20.5 ttl pk-yrs)    Types: Cigarettes    Start date: 10/27/1974    Quit date: 10/27/2015    Years since quitting: 8.2   Smokeless tobacco: Never  Vaping Use   Vaping status: Never Used  Substance and Sexual Activity   Alcohol use: No   Drug use: No   Sexual activity: Not Currently  Other Topics Concern   Not on file  Social History Narrative   03/14/21 son with her now, he lives 20 min away   significant other recently passed away   Social Drivers of Health    Financial Resource Strain: Low Risk  (03/25/2023)   Received from [facility]   Overall Financial Resource Strain (CARDIA)    Difficulty of Paying Living Expenses: Not hard at all  Food Insecurity: No Food Insecurity (03/25/2023)   Received from [facility]   Hunger Vital Sign    Within the past 12 months, you worried that your food would run out before you got the money to buy more.: Never true    Within the past 12 months, the food you bought just didn't last and you didn't have money to get more.: Never true  Transportation Needs: No Transportation Needs (03/25/2023)   Received from [facility] - Transportation    In the past 12 months, has lack of transportation kept you from medical appointments or from getting medications?: No    Lack of Transportation (Non-Medical): No  Physical Activity: Not on file  Stress: Not on file  Social Connections: Not on file  Intimate Partner Violence: Not At Risk (11/13/2022)   Humiliation, Afraid, Rape, and Kick questionnaire    Fear of Current or Ex-Partner: No    Emotionally Abused: No    Physically Abused: No    Sexually Abused: No    Past Surgical History:  Procedure Laterality Date   APPENDECTOMY     CHOLECYSTECTOMY     COLONOSCOPY WITH PROPOFOL  N/A 04/30/2015   Procedure: COLONOSCOPY WITH PROPOFOL ;  Surgeon: Donnice Vaughn Manes, MD;  Location: [facility] ENDOSCOPY;  Service: Endoscopy;  Laterality: N/A;   COLONOSCOPY WITH PROPOFOL  N/A 07/13/2017   Procedure: COLONOSCOPY WITH PROPOFOL ;  Surgeon: Viktoria Lamar DASEN, MD;  Location: [facility] ENDOSCOPY;  Service: Endoscopy;  Laterality: N/A;   ESOPHAGOGASTRODUODENOSCOPY (EGD) WITH PROPOFOL  N/A 07/13/2017   Procedure: ESOPHAGOGASTRODUODENOSCOPY (EGD) WITH PROPOFOL ;  Surgeon: Viktoria Lamar DASEN, MD;  Location: [facility] ENDOSCOPY;  Service: Endoscopy;  Laterality: N/A;   EYE SURGERY     LOWER EXTREMITY ANGIOGRAPHY Right 01/01/2022   Procedure: Lower  Extremity Angiography;  Surgeon: Marea Selinda RAMAN, MD;  Location: [facility] INVASIVE CV LAB;  Service: Cardiovascular;  Laterality: Right;    Family History  Problem Relation Age of Onset   Breast cancer Paternal Grandmother     Allergies  Allergen Reactions   Sulfasalazine Shortness Of Breath   Aspirin     Darvon [Propoxyphene]    Iodine   Penicillins    Sulfa Antibiotics    Zithromax  [Azithromycin ]        Latest Ref Rng & Units 03/15/2023    3:33 AM 03/12/2023    7:49 PM 11/18/2022    6:04 AM  CBC  WBC 4.0 - 10.5 K/uL 14.8  10.7  7.3   Hemoglobin 12.0 - 15.0 g/dL 85.7  83.8  84.9   Hematocrit 36.0 - 46.0 % 42.6  48.7  47.3   Platelets 150 - 400 K/uL 233  195  283       CMP     Component Value Date/Time   NA 137 03/15/2023 0333   NA 142 09/24/2021 1106   NA 137 05/09/2012 0748   K 3.7 03/15/2023 0333   K 2.9 (L) 05/09/2012 0748   CL 98 03/15/2023 0333   CL 99 05/09/2012 0748   CO2 29 03/15/2023 0333   CO2 32 05/09/2012 0748   GLUCOSE 153 (H) 03/15/2023 0333   GLUCOSE 108 (H) 05/09/2012 0748   BUN 16 03/15/2023 0333   BUN 17 09/24/2021 1106   BUN 11 05/09/2012 0748   CREATININE 1.06 (H) 03/15/2023 0333   CREATININE 0.64 05/09/2012 0748   CALCIUM  9.0 03/15/2023 0333   CALCIUM  9.1 05/09/2012 0748   PROT 7.8 03/15/2023 0333   PROT 7.1 09/24/2021 1106   PROT 7.1 05/01/2012 0823   ALBUMIN 4.4 03/15/2023 0333   ALBUMIN 4.8 09/24/2021 1106   ALBUMIN 3.9 05/01/2012 0823   AST 21 03/15/2023 0333   AST 13 (L) 05/01/2012 0823   ALT 19 03/15/2023 0333   ALT 21 05/01/2012 0823   ALKPHOS 89 03/15/2023 0333   ALKPHOS 173 (H) 05/01/2012 0823   BILITOT 2.5 (H) 03/15/2023 0333   BILITOT 1.5 (H) 09/24/2021 1106   BILITOT 1.4 (H) 05/01/2012 0823   EGFR 48 (L) 09/24/2021 1106   GFRNONAA 58 (L) 03/15/2023 0333   GFRNONAA >60 05/09/2012 0748     VAS US  ABI WITH/WO TBI Result Date: 01/11/2024  LOWER EXTREMITY DOPPLER STUDY Patient Name:  Natalie [name]  Date of Exam:   01/11/2024  Medical Rec #: [id number]    Accession #:    [id number] Date of Birth: 1955-10-25    Patient Gender: F Patient Age:   65 years Exam Location:  [facility] Procedure:      VAS US  ABI WITH/WO TBI Referring Phys: --------------------------------------------------------------------------------  Indications: Peripheral artery disease.  Vascular Interventions: 01/01/2022: Catheter placement into Aorta from Bilateral                         femoral approach. Aortogram and Selective Right Lower                         Extremity Angiogram. PTA of the Aorta and the Right CIA                         with 6 mm diameter Lutonix drug coated balloon to                         predilate the lesions. Stent placement to the Right CIA                         with 8 mm diameter by 37 mm length Lifestream stent.  Stent placement to the Aorta with 12 mm diameter by 38                         mm length lifestream stent postdialated with a 14 mm                         balloon. Comparison Study: 11/2022 Performing Technologist: Jerel Croak RVT  Examination Guidelines: A complete evaluation includes at minimum, Doppler waveform signals and systolic blood pressure reading at the level of bilateral brachial, anterior tibial, and posterior tibial arteries, when vessel segments are accessible. Bilateral testing is considered an integral part of a complete examination. Photoelectric Plethysmograph (PPG) waveforms and toe systolic pressure readings are included as required and additional duplex testing as needed. Limited examinations for reoccurring indications may be performed as noted.  ABI Findings: +---------+------------------+-----+---------+--------+ Right    Rt Pressure (mmHg)IndexWaveform Comment  +---------+------------------+-----+---------+--------+ Brachial 119                                      +---------+------------------+-----+---------+--------+ PTA      122               1.01  biphasic          +---------+------------------+-----+---------+--------+ DP       138               1.14 triphasic         +---------+------------------+-----+---------+--------+ Burnetta Griffon               1.14 Normal            +---------+------------------+-----+---------+--------+ +---------+------------------+-----+--------+-------+ Left     Lt Pressure (mmHg)IndexWaveformComment +---------+------------------+-----+--------+-------+ Brachial 121                                    +---------+------------------+-----+--------+-------+ PTA      127               1.05 biphasic        +---------+------------------+-----+--------+-------+ DP       133               1.10 biphasic        +---------+------------------+-----+--------+-------+ Burnetta Griffon               1.14 Normal          +---------+------------------+-----+--------+-------+ +-------+-----------+-----------+------------+------------+ ABI/TBIToday's ABIToday's TBIPrevious ABIPrevious TBI +-------+-----------+-----------+------------+------------+ Right  1.14       1.14       1.07        .84          +-------+-----------+-----------+------------+------------+ Left   1.10       1.14       1.05        1.02         +-------+-----------+-----------+------------+------------+ Bilateral ABIs and TBIs appear essentially unchanged compared to prior study on 11/2022.  Summary: Right: Resting right ankle-brachial index is within normal range. The right toe-brachial index is normal. Left: Resting left ankle-brachial index is within normal range. The left toe-brachial index is normal. *See table(s) above for measurements and observations.  Electronically signed by Selinda Gu MD on 01/11/2024 at 1:02:17 PM.    Final        Assessment & Plan:   1. 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  The patient does not voice lifestyle limiting changes at this point in time.   Noninvasive studies do not suggest clinically significant change.   No invasive studies, angiography or surgery at this time The patient should continue walking and begin a more formal exercise program.  The patient should continue antiplatelet therapy and aggressive treatment of the lipid abnormalities   No changes in the patient's medications at this time  Patient will follow up in one year with Bilateral lower extremity U/S with ABI's   Patient will follow up in 6 months with bilateral lower extremity Venous ultrasounds for reflux and DVT after being compliant with conventional therapy  2. Swelling of limbs This is not doing well today, patient highly encouraged to continue with conservative therapy including use of medical grade compression stockings, elevation and activity.   3. Stage 3a chronic kidney disease (HCC) This may also contribute to patient's lower extremity edema.    Current Outpatient Medications on File Prior to Visit  Medication Sig Dispense Refill   albuterol  (VENTOLIN  HFA) 108 (90 Base) MCG/ACT inhaler SMARTSIG:2 Inhalation Via Inhaler Every 6 Hours PRN     amLODipine  (NORVASC ) 5 MG tablet Take 5 mg by mouth daily.     atorvastatin  (LIPITOR) 40 MG tablet Take 40 mg by mouth daily.     clopidogrel  (PLAVIX ) 75 MG tablet Take 1 tablet (75 mg total) by mouth daily. 30 tablet 0   hydrocortisone  cream 1 % Apply to affected area 2 times daily(right hand) 15 g 0   losartan  (COZAAR ) 25 MG tablet Take 25 mg by mouth daily.     methadone  (DOLOPHINE ) 1 mg/ml oral solution Take 47 mg by mouth daily.     metoCLOPramide  (REGLAN ) 10 MG tablet Take 1 tablet (10 mg total) by mouth every 6 (six) hours as needed for nausea or vomiting (headache). 20 tablet 0   OXYGEN Inhale 4.5 L into the lungs continuous.     ipratropium-albuterol  (DUONEB) 0.5-2.5 (3) MG/3ML SOLN  Take by nebulization. (Patient not taking: Reported on 02/10/2023)     predniSONE  (DELTASONE ) 10 MG tablet Take 1 tablet (10 mg total) by mouth daily. Day 1-3: take 4 tablets PO daily Day 4-6: take 3 tablets PO daily Day 7-9: take 2 tablets PO daily Day 10-12: take 1 tablet PO daily 30 tablet 0   No current facility-administered medications on file prior to visit.    There are no Patient Instructions on file for this visit. No follow-ups on file.   Natalie JONELLE Shank, NP

## 2024-01-25 NOTE — Addendum Note (Signed)
 Addended by: ELISABETH ROUND on: 01/25/2024 01:47 PM   Modules accepted: Level of Service

## 2024-02-15 ENCOUNTER — Telehealth: Payer: Self-pay | Admitting: Adult Health

## 2024-02-15 NOTE — Telephone Encounter (Signed)
 Cld Pt son regarding phone message. Pt had a fall down steps in July and although no fx, gave sling to stabilize Right arm/shoulder. Son states Pt still has issues with that arm. Son stated that since fall, Pt has had issues w/balance and has had other falls without injury. Son stated in the last week he has noticed a decline in her word finding abilities and that to him, she has declined cognitively to where she was shortly after the stroke. Pt was last seen in 02/2023 by NP and it was noted that if Pt needed to be seen for any new neurological issues she would need a referral from PCP to be seen by one of the neurologists since she has only seen a NP. Son voiced understanding and stated he will reach out to PCP for referral.

## 2024-02-15 NOTE — Telephone Encounter (Signed)
 Patient's son, Natalie [name] patient experiencing balance issues, memory issues, extremely tired to the point of half sleep everyday. Would like a call from the nurse.

## 2024-04-04 ENCOUNTER — Other Ambulatory Visit: Payer: Self-pay | Admitting: Specialist

## 2024-04-04 DIAGNOSIS — Z87891 Personal history of nicotine dependence: Secondary | ICD-10-CM

## 2024-04-19 ENCOUNTER — Ambulatory Visit
Admission: RE | Admit: 2024-04-19 | Discharge: 2024-04-19 | Disposition: A | Discharge status: Home / Self Care | Source: Ambulatory Visit | Attending: Specialist | Admitting: Specialist

## 2024-04-19 DIAGNOSIS — Z87891 Personal history of nicotine dependence: Secondary | ICD-10-CM | POA: Insufficient documentation

## 2024-07-13 ENCOUNTER — Other Ambulatory Visit (INDEPENDENT_AMBULATORY_CARE_PROVIDER_SITE_OTHER): Payer: Self-pay | Admitting: Vascular Surgery

## 2024-07-13 DIAGNOSIS — M7989 Other specified soft tissue disorders: Secondary | ICD-10-CM

## 2024-07-20 ENCOUNTER — Encounter (INDEPENDENT_AMBULATORY_CARE_PROVIDER_SITE_OTHER)

## 2024-07-20 ENCOUNTER — Ambulatory Visit (INDEPENDENT_AMBULATORY_CARE_PROVIDER_SITE_OTHER): Admitting: Vascular Surgery

## 2024-08-03 ENCOUNTER — Other Ambulatory Visit (INDEPENDENT_AMBULATORY_CARE_PROVIDER_SITE_OTHER)

## 2024-08-03 ENCOUNTER — Ambulatory Visit (INDEPENDENT_AMBULATORY_CARE_PROVIDER_SITE_OTHER): Admitting: Nurse Practitioner

## 2024-08-03 ENCOUNTER — Encounter (INDEPENDENT_AMBULATORY_CARE_PROVIDER_SITE_OTHER): Payer: Self-pay | Admitting: Nurse Practitioner

## 2024-08-03 VITALS — BP 148/75 | HR 96 | Resp 16 | Wt 138.6 lb

## 2024-08-03 DIAGNOSIS — I872 Venous insufficiency (chronic) (peripheral): Secondary | ICD-10-CM

## 2024-08-03 DIAGNOSIS — M7989 Other specified soft tissue disorders: Secondary | ICD-10-CM

## 2024-08-03 DIAGNOSIS — I70221 Atherosclerosis of native arteries of extremities with rest pain, right leg: Secondary | ICD-10-CM

## 2024-08-03 NOTE — Progress Notes (Signed)
 "  Subjective:    Patient ID: Natalie [name], female    DOB: 03/04/1956, 69 y.o.   MRN: [id number] Chief Complaint  Patient presents with   Follow-up    6 month reflux follow up    HPI  Discussed the use of AI scribe software for clinical note transcription with the patient, who gave verbal consent to proceed.  History of Present Illness Natalie [name] is a 69 year old female with peripheral arterial disease and chronic venous insufficiency who presents for follow-up of right lower extremity swelling.  She reports intermittent swelling of the right lower extremity, which recurred several days prior to the visit but resolved with use of compression therapy. Currently, she has minimal swelling while wearing her compression sock and occasionally wears it overnight. Swelling improves with conservative measures including compression and elevation. She denies abrupt onset, severe, erythematous, or painful swelling. She has not experienced non-healing wounds or ulcers on the right leg.  She denies significant swelling of the left lower extremity, noting that it has consistently remained smaller than the right and has not caused symptoms. She has not experienced new or worsening symptoms in the left leg.  Recent venous duplex ultrasound demonstrated no evidence of deep vein thrombosis. The deep venous system in the right leg is patent, while the superficial system shows valvular incompetence at the saphenofemoral junction. She is currently managing her symptoms with compression therapy and leg elevation.  She denies claudication, non-healing wounds, or ulcers suggestive of progression of arterial disease.    Results     Review of Systems  Cardiovascular:  Positive for leg swelling.  Neurological:  Positive for weakness.  All other systems reviewed and are negative.      Objective:   Physical Exam Vitals reviewed.  HENT:     Head: Normocephalic.  Cardiovascular:     Rate and Rhythm:  Normal rate.  Pulmonary:     Effort: Pulmonary effort is normal.     Comments: Home o2 Musculoskeletal:     Right lower leg: 1+ Edema present.     Left lower leg: 1+ Edema present.  Skin:    General: Skin is warm and dry.  Neurological:     Mental Status: She is alert and oriented to person, place, and time.  Psychiatric:        Mood and Affect: Mood normal.        Behavior: Behavior normal.        Thought Content: Thought content normal.        Judgment: Judgment normal.     Physical Exam    BP (!) 148/75 (BP Location: Right Arm)   Pulse 96   Resp 16   Wt 138 lb 9.6 oz (62.9 kg)   BMI 23.79 kg/m   Past Medical History:  Diagnosis Date   Cataracts, bilateral    Chickenpox cataracts   COPD (chronic obstructive pulmonary disease) (HCC)    Depression    Drug addiction (HCC)    Headache    Hepatitis C virus infection    Hepatitis C without hepatic coma    History of chicken pox    History of colon polyps    Seizures (HCC)    Stroke [facility])     Social History   Socioeconomic History   Marital status: Married    Spouse name: Not on file   Number of children: 2   Years of education: GED   Highest education level: Not on file  Occupational History   Not on file  Tobacco Use   Smoking status: Former    Current packs/day: 0.00    Average packs/day: 0.5 packs/day for 41.0 years (20.5 ttl pk-yrs)    Types: Cigarettes    Start date: 10/27/1974    Quit date: 10/27/2015    Years since quitting: 8.7   Smokeless tobacco: Never  Vaping Use   Vaping status: Never Used  Substance and Sexual Activity   Alcohol use: No   Drug use: No   Sexual activity: Not Currently  Other Topics Concern   Not on file  Social History Narrative   03/14/21 son with her now, he lives 20 min away   significant other recently passed away   Social Drivers of Health   Tobacco Use: Medium Risk (08/03/2024)   Patient History    Smoking Tobacco Use: Former    Smokeless Tobacco Use: Never     Passive Exposure: Not on file  Financial Resource Strain: Low Risk  (05/04/2024)   Received from [facility]   Overall Financial Resource Strain (CARDIA)    Difficulty of Paying Living Expenses: Not hard at all  Food Insecurity: No Food Insecurity (05/04/2024)   Received from [facility]   Epic    Within the past 12 months, you worried that your food would run out before you got the money to buy more.: Never true    Within the past 12 months, the food you bought just didn't last and you didn't have money to get more.: Never true  Transportation Needs: No Transportation Needs (05/04/2024)   Received from [facility] - Transportation    In the past 12 months, has lack of transportation kept you from medical appointments or from getting medications?: No    Lack of Transportation (Non-Medical): No  Physical Activity: Not on file  Stress: Not on file  Social Connections: Not on file  Intimate Partner Violence: Not At Risk (11/13/2022)   Humiliation, Afraid, Rape, and Kick questionnaire    Fear of Current or Ex-Partner: No    Emotionally Abused: No    Physically Abused: No    Sexually Abused: No  Depression (PHQ2-9): Not on file  Alcohol Screen: Not on file  Housing: Low Risk  (05/04/2024)   Received from [facility]   Epic    In the last 12 months, was there a time when you were not able to pay the mortgage or rent on time?: No    In the past 12 months, how many times have you moved where you were living?: 0    At any time in the past 12 months, were you homeless or living in a shelter (including now)?: No  Utilities: Not At Risk (05/04/2024)   Received from [facility]   Epic    In the past 12 months has the electric, gas, oil, or water company threatened to shut off services in your home?: No  Health Literacy: Not on file    Past Surgical History:  Procedure Laterality Date    APPENDECTOMY     CHOLECYSTECTOMY     COLONOSCOPY WITH PROPOFOL  N/A 04/30/2015   Procedure: COLONOSCOPY WITH PROPOFOL ;  Surgeon: Donnice Vaughn Manes, MD;  Location: [facility] ENDOSCOPY;  Service: Endoscopy;  Laterality: N/A;   COLONOSCOPY WITH PROPOFOL  N/A 07/13/2017   Procedure: COLONOSCOPY WITH PROPOFOL ;  Surgeon: Viktoria Lamar DASEN, MD;  Location: [facility] ENDOSCOPY;  Service: Endoscopy;  Laterality: N/A;   ESOPHAGOGASTRODUODENOSCOPY (EGD) WITH PROPOFOL  N/A 07/13/2017   Procedure: ESOPHAGOGASTRODUODENOSCOPY (EGD) WITH PROPOFOL ;  Surgeon: Viktoria Lamar DASEN, MD;  Location: [facility] ENDOSCOPY;  Service: Endoscopy;  Laterality: N/A;   EYE SURGERY     LOWER EXTREMITY ANGIOGRAPHY Right 01/01/2022   Procedure: Lower Extremity Angiography;  Surgeon: Marea Selinda RAMAN, MD;  Location: [facility] INVASIVE CV LAB;  Service: Cardiovascular;  Laterality: Right;    Family History  Problem Relation Age of Onset   Breast cancer Paternal Grandmother     Allergies[1]     Latest Ref Rng & Units 03/15/2023    3:33 AM 03/12/2023    7:49 PM 11/18/2022    6:04 AM  CBC  WBC 4.0 - 10.5 K/uL 14.8  10.7  7.3   Hemoglobin 12.0 - 15.0 g/dL 85.7  83.8  84.9   Hematocrit 36.0 - 46.0 % 42.6  48.7  47.3   Platelets 150 - 400 K/uL 233  195  283       CMP     Component Value Date/Time   NA 137 03/15/2023 0333   NA 142 09/24/2021 1106   NA 137 05/09/2012 0748   K 3.7 03/15/2023 0333   K 2.9 (L) 05/09/2012 0748   CL 98 03/15/2023 0333   CL 99 05/09/2012 0748   CO2 29 03/15/2023 0333   CO2 32 05/09/2012 0748   GLUCOSE 153 (H) 03/15/2023 0333   GLUCOSE 108 (H) 05/09/2012 0748   BUN 16 03/15/2023 0333   BUN 17 09/24/2021 1106   BUN 11 05/09/2012 0748   CREATININE 1.06 (H) 03/15/2023 0333   CREATININE 0.64 05/09/2012 0748   CALCIUM  9.0 03/15/2023 0333   CALCIUM  9.1 05/09/2012 0748   PROT 7.8 03/15/2023 0333   PROT 7.1 09/24/2021 1106   PROT 7.1 05/01/2012 0823   ALBUMIN 4.4 03/15/2023 0333   ALBUMIN 4.8 09/24/2021 1106   ALBUMIN  3.9 05/01/2012 0823   AST 21 03/15/2023 0333   AST 13 (L) 05/01/2012 0823   ALT 19 03/15/2023 0333   ALT 21 05/01/2012 0823   ALKPHOS 89 03/15/2023 0333   ALKPHOS 173 (H) 05/01/2012 0823   BILITOT 2.5 (H) 03/15/2023 0333   BILITOT 1.5 (H) 09/24/2021 1106   BILITOT 1.4 (H) 05/01/2012 0823   EGFR 48 (L) 09/24/2021 1106   GFRNONAA 58 (L) 03/15/2023 0333   GFRNONAA >60 05/09/2012 0748     No results found.     Assessment & Plan:   1. 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 Annual surveillance is appropriate. Major arterial surgery not indicated due to risks. - Scheduled follow-up for arterial assessment in six months. - Provided anticipatory guidance to report new symptoms including increased leg pain with ambulation, non-healing wounds, or ulcers for prompt evaluation. - VAS US  ABI WITH/WO TBI; Future    2. Chronic venous insufficiency Chronic venous insufficiency of right leg Chronic venous insufficiency presents as mild swelling, controlled with conservative measures. Venous ultrasound showed superficial valve insufficiency without deep vein thrombosis. Conservative management preferred. - Recommended continued use of compression socks, particularly during activity and appointments. - Advised leg elevation as tolerated. - Provided education on signs of concerning swelling and indications for seeking care. - Deferred invasive venous interventions and pneumatic compression pump due to adequate symptom control.   Assessment and Plan Assessment & Plan  Medications Ordered Prior to Encounter[2]  There are no Patient Instructions on file for this visit. Return in about 6 months (around 01/31/2025) for ABI with Jd/FB.   Telesha Deguzman E Abir Eroh, NP      [1]  Allergies Allergen Reactions    Sulfasalazine Shortness Of Breath   Aspirin     Darvon [Propoxyphene]    Iodine     Penicillins    Sulfa Antibiotics    Zithromax  [Azithromycin ]   [2]  Current Outpatient Medications on File Prior to Visit  Medication Sig Dispense Refill   albuterol  (VENTOLIN  HFA) 108 (90 Base) MCG/ACT inhaler SMARTSIG:2 Inhalation Via Inhaler Every 6 Hours PRN     amLODipine  (NORVASC ) 5 MG tablet Take 5 mg by mouth daily.     atorvastatin  (LIPITOR) 40 MG tablet Take 40 mg by mouth daily.     clopidogrel  (PLAVIX ) 75 MG tablet Take 1 tablet (75 mg total) by mouth daily. 30 tablet 0   HYDROcodone -acetaminophen  (NORCO/VICODIN) 5-325 MG tablet Take 1 tablet by mouth every 6 (six) hours as needed. 15 tablet 0   hydrocortisone  cream 1 % Apply to affected area 2 times daily(right hand) 15 g 0   losartan  (COZAAR ) 25 MG tablet Take 25 mg by mouth daily.     methadone  (DOLOPHINE ) 1 mg/ml oral solution Take 47 mg by mouth daily.     metoCLOPramide  (REGLAN ) 10 MG tablet Take 1 tablet (10 mg total) by mouth every 6 (six) hours as needed for nausea or vomiting (headache). 20 tablet 0   OXYGEN Inhale 4.5 L into the lungs continuous.     predniSONE  (DELTASONE ) 10 MG tablet Take 1 tablet (10 mg total) by mouth daily. Day 1-3: take 4 tablets PO daily Day 4-6: take 3 tablets PO daily Day 7-9: take 2 tablets PO daily Day 10-12: take 1 tablet PO daily 30 tablet 0   ipratropium-albuterol  (DUONEB) 0.5-2.5 (3) MG/3ML SOLN Take by nebulization. (Patient not taking: Reported on 08/03/2024)     No current facility-administered medications on file prior to visit.   "

## 2025-01-17 ENCOUNTER — Encounter (INDEPENDENT_AMBULATORY_CARE_PROVIDER_SITE_OTHER): Admitting: Vascular Surgery

## 2025-01-17 ENCOUNTER — Encounter (INDEPENDENT_AMBULATORY_CARE_PROVIDER_SITE_OTHER)

## 2025-01-31 ENCOUNTER — Encounter (INDEPENDENT_AMBULATORY_CARE_PROVIDER_SITE_OTHER)

## 2025-01-31 ENCOUNTER — Ambulatory Visit (INDEPENDENT_AMBULATORY_CARE_PROVIDER_SITE_OTHER): Admitting: Nurse Practitioner
